# Patient Record
Sex: Female | Born: 1990 | Hispanic: Yes | Marital: Single | Smoking: Never smoker
Health system: Southern US, Community
[De-identification: ages and names within clinical notes are randomized; demographics above are authoritative.]

## PROBLEM LIST (undated history)

## (undated) DIAGNOSIS — Z923 Personal history of irradiation: Secondary | ICD-10-CM

## (undated) DIAGNOSIS — C50919 Malignant neoplasm of unspecified site of unspecified female breast: Secondary | ICD-10-CM

## (undated) DIAGNOSIS — Z9221 Personal history of antineoplastic chemotherapy: Secondary | ICD-10-CM

## (undated) HISTORY — PX: BREAST BIOPSY: SHX20

## (undated) HISTORY — PX: BREAST LUMPECTOMY: SHX2

---

## 2019-03-21 ENCOUNTER — Other Ambulatory Visit (HOSPITAL_COMMUNITY): Payer: Self-pay | Admitting: *Deleted

## 2019-03-21 DIAGNOSIS — N644 Mastodynia: Secondary | ICD-10-CM

## 2019-04-26 ENCOUNTER — Ambulatory Visit (HOSPITAL_COMMUNITY)
Admission: RE | Admit: 2019-04-26 | Discharge: 2019-04-26 | Disposition: A | Discharge status: Home / Self Care | Payer: Self-pay | Source: Ambulatory Visit | Attending: Obstetrics and Gynecology | Admitting: Obstetrics and Gynecology

## 2019-04-26 ENCOUNTER — Encounter (HOSPITAL_COMMUNITY): Payer: Self-pay

## 2019-04-26 ENCOUNTER — Other Ambulatory Visit: Payer: Self-pay

## 2019-04-26 DIAGNOSIS — N6310 Unspecified lump in the right breast, unspecified quadrant: Secondary | ICD-10-CM

## 2019-04-26 DIAGNOSIS — Z01419 Encounter for gynecological examination (general) (routine) without abnormal findings: Secondary | ICD-10-CM

## 2019-04-26 NOTE — Patient Instructions (Signed)
Explained breast self awareness with Monserrath Wynn Banker. Let patient know [facility] will cover Pap smears every 3 years unless has a history of abnormal Pap smears. Referred patient to the [facility] for a right breast ultrasound. Appointment scheduled for Wednesday, April 27, 2019 at 1030. Patient aware of appointment and will be there. Let patient know will follow up with her within the next couple weeks with results of Pap smear by letter or phone. [place] verbalized understanding.  Navie Lamoreaux, Arvil Chaco, RN 3:09 PM

## 2019-04-26 NOTE — Progress Notes (Signed)
Complaints of right breast lump x 2 months that became painful around a week ago. Patient states the pain is constant. Patient rates the pain at a 5 out of 10.  Pap Smear: Pap smear completed today. Per patient has never had a Pap smear completed. No Pap smear results are in Epic.  Physical exam: Breasts Breasts symmetrical. No skin abnormalities bilateral breasts. No nipple retraction bilateral breasts. No nipple discharge bilateral breasts. No lymphadenopathy. No lumps palpated left breast. Palpated a 13 cm x 13 cm lump within the right center breast under the nipple area. Complaints of tenderness when palpated right breast lump. Referred patient to the [facility] for a right breast ultrasound. Appointment scheduled for Wednesday, April 27, 2019 at 1030.        Pelvic/Bimanual   Ext Genitalia No lesions, no swelling and no discharge observed on external genitalia.         Vagina Vagina pink and normal texture. No lesions or discharge observed in vagina.          Cervix Cervix is present. Cervix pink and of normal texture. No discharge observed. IUD strings visualized.    Uterus Uterus is present and palpable. Uterus in normal position and normal size.       Adnexae Bilateral ovaries present and palpable. No tenderness on palpation.         Rectovaginal No rectal exam completed today since patient had no rectal complaints. No skin abnormalities observed on exam.    Smoking History: Patient has never smoked.  Patient Navigation: Patient education provided. Access to services provided for patient through [facility] program. Spanish interpreter provided.   Breast and Cervical Cancer Risk Assessment: Patient has a family history of her mother and a maternal aunt having breast cancer. Patient has no known genetic mutations or history of radiation treatment to the chest before age 78. Patient has no history of cervical dysplasia, immunocompromised, or DES exposure in-utero.  Breast cancer risk completed. No breast cancer risk calculated due to patient is less than 61 years old.  Used Spanish interpreter [employer] from [place].

## 2019-04-27 ENCOUNTER — Other Ambulatory Visit (HOSPITAL_COMMUNITY): Payer: Self-pay | Admitting: Obstetrics and Gynecology

## 2019-04-27 ENCOUNTER — Ambulatory Visit
Admission: RE | Admit: 2019-04-27 | Discharge: 2019-04-27 | Disposition: A | Discharge status: Home / Self Care | Payer: No Typology Code available for payment source | Source: Ambulatory Visit | Attending: Obstetrics and Gynecology | Admitting: Obstetrics and Gynecology

## 2019-04-27 DIAGNOSIS — N6310 Unspecified lump in the right breast, unspecified quadrant: Secondary | ICD-10-CM

## 2019-04-27 DIAGNOSIS — N644 Mastodynia: Secondary | ICD-10-CM

## 2019-04-27 LAB — CYTOLOGY - PAP: Diagnosis: NEGATIVE

## 2019-04-27 IMAGING — US US BREAST*R* LIMITED INC AXILLA
1 series · 13 of 20 positions shown · non-contrast
Comparison: Previous exam(s).

CLINICAL DATA: Rapidly enlarging mass felt by the patient in the
right breast since [DATE]. She reports that the mass was
approximately the size of a golf ball when she 1st noticed it.
Family history of breast cancer in the patient's mother and maternal
aunt. She is not sure but thinks that her mother was diagnosed with
breast cancer in her late 30's.

EXAM:
ULTRASOUND OF THE RIGHT BREAST

[Series 1: us breast*right* limited inc axilla · 0.12mm/px · 13 of 20 slices shown]
[im 1/20]
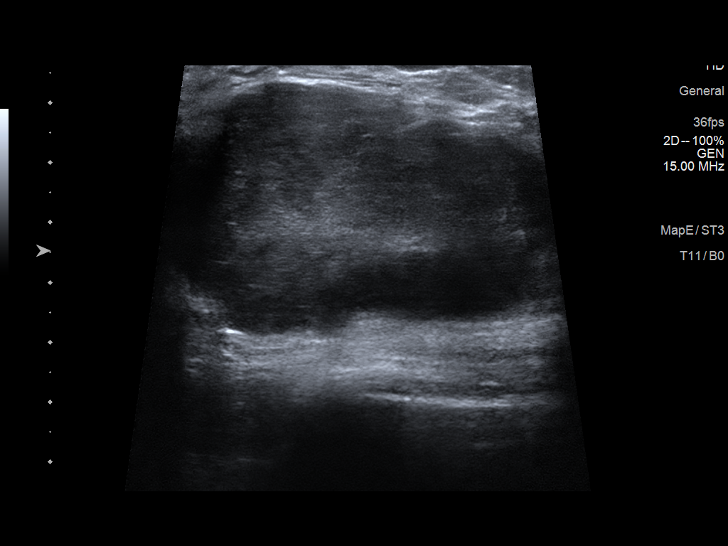
[im 3/20]
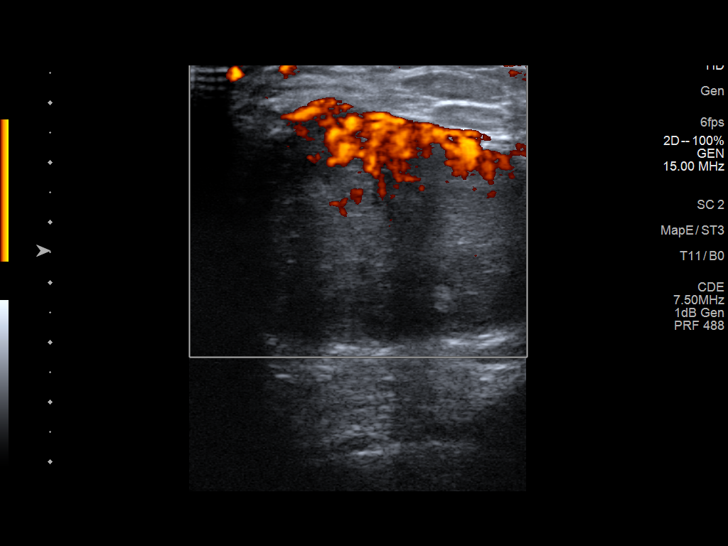
[im 4/20]
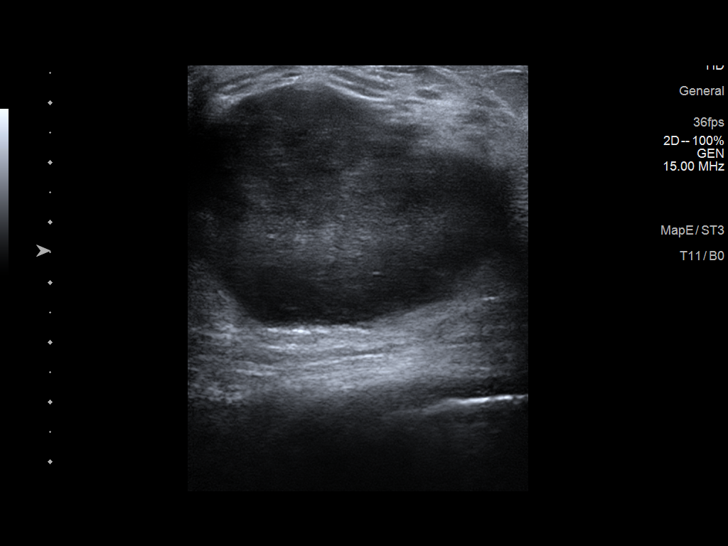
[im 6/20]
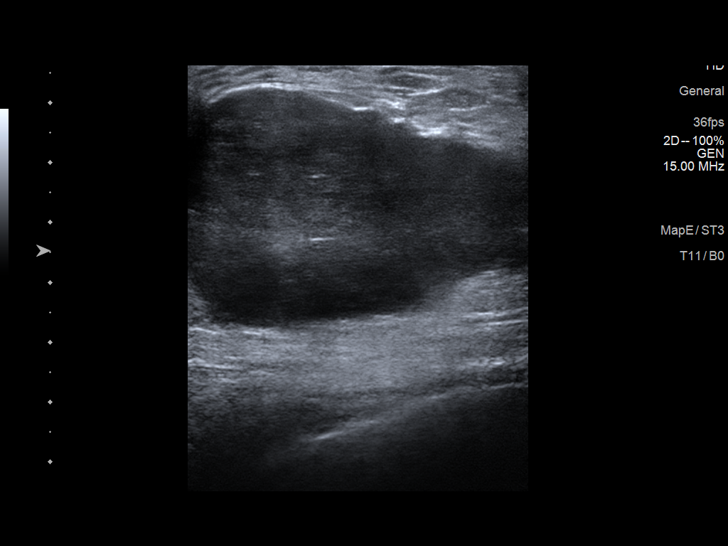
[im 7/20]
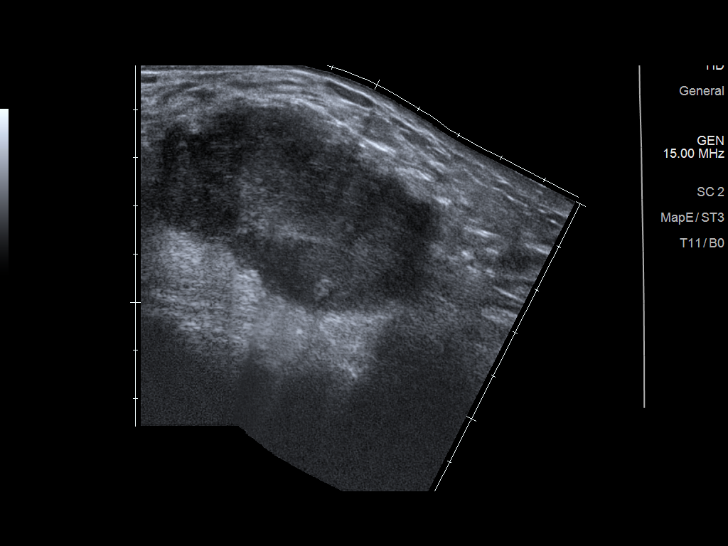
[im 9/20]
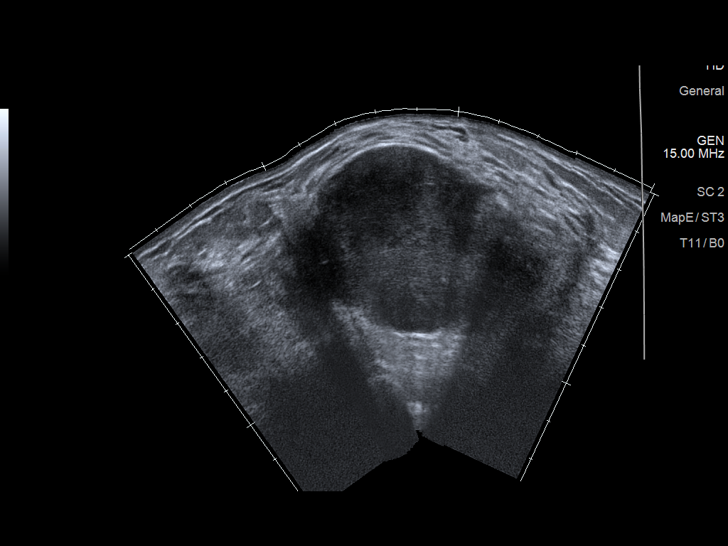
[im 11/20]
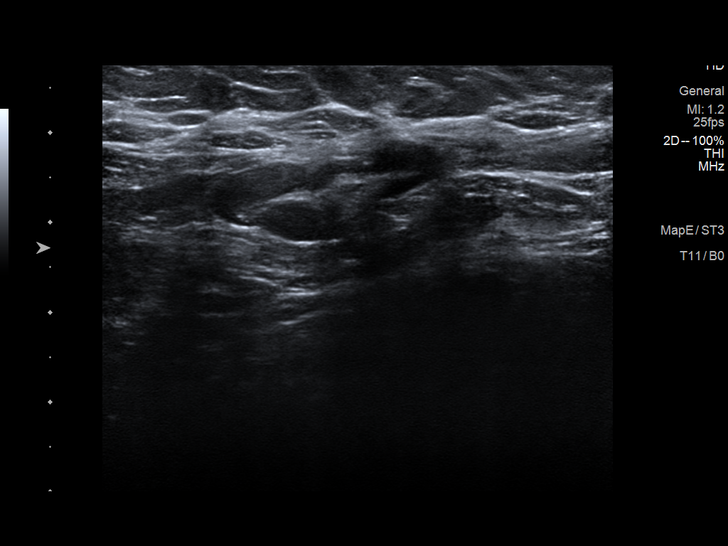
[im 12/20]
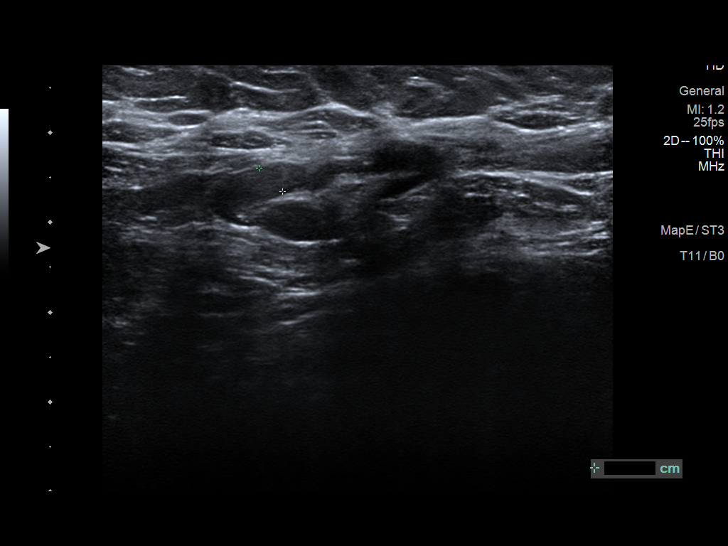
[im 14/20]
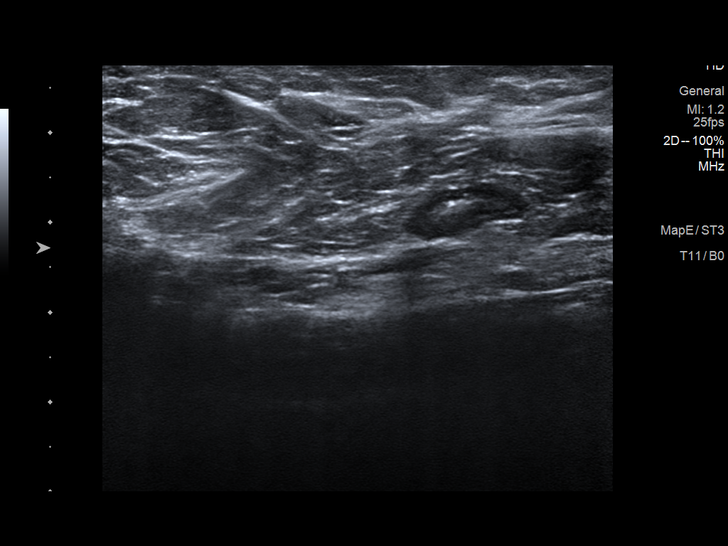
[im 15/20]
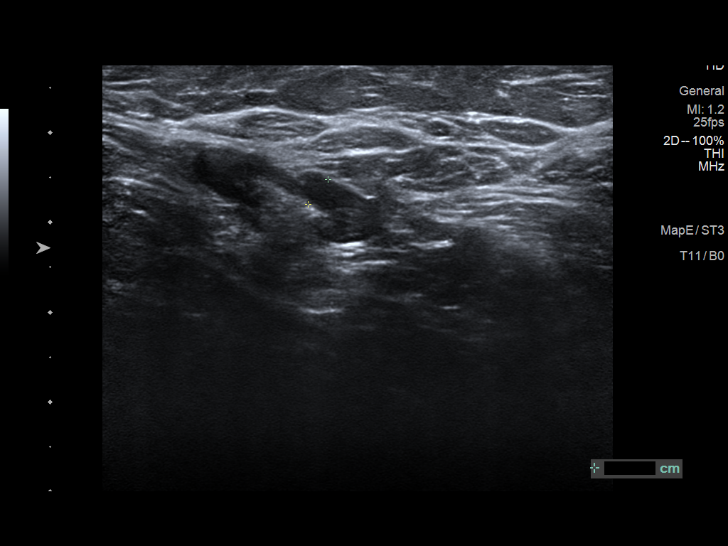
[im 17/20]
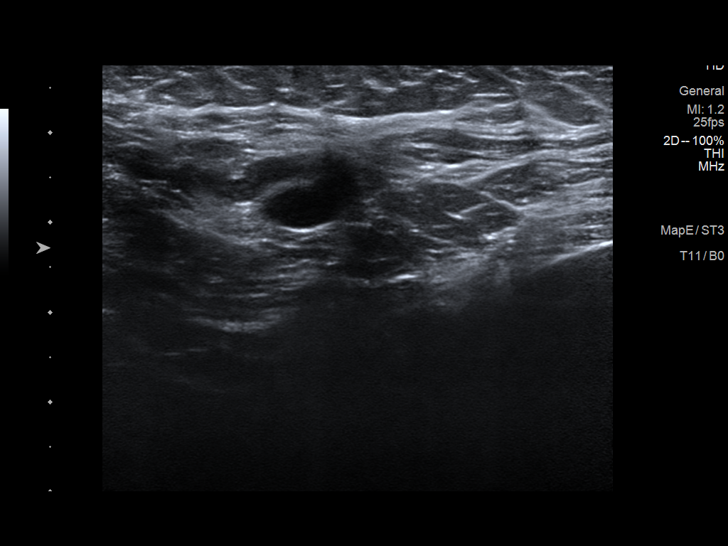
[im 18/20]
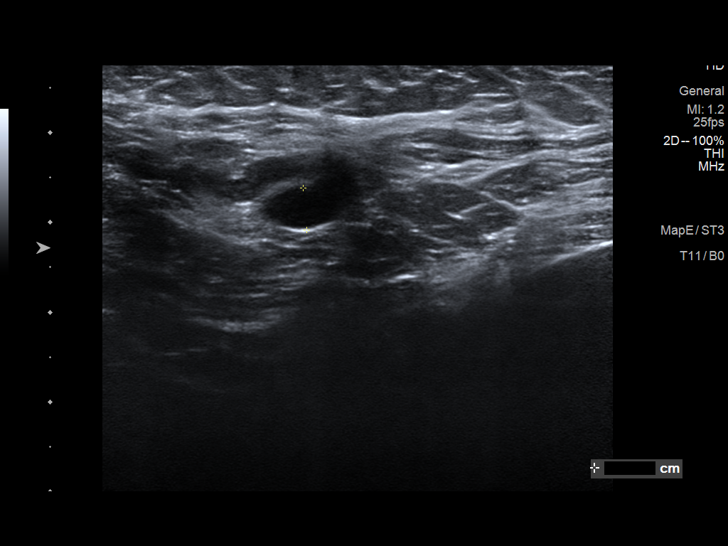
[im 20/20]
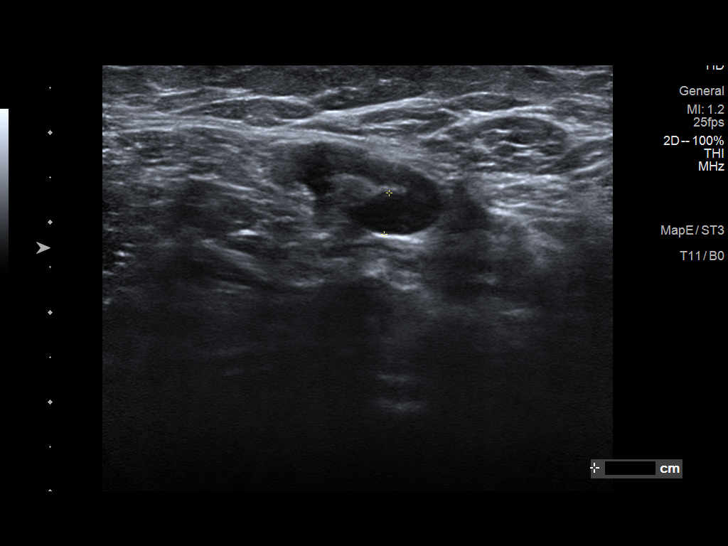

[13 of 20 positions shown; findings below may reference images not displayed]

FINDINGS: On physical exam, there is an approximately 7 cm oval, firm,
somewhat fixed palpable mass in the inferior retroareolar and
periareolar right breast. There are no palpable right axillary lymph
nodes.

Targeted ultrasound is performed, showing a 6.1 x 6.0 x 3.8 cm oval,
horizontally oriented, mildly heterogeneous, hypoechoic mass
centered in the 5 o'clock position of the right breast, 3 cm from
the nipple. This has some circumscribed and some indistinct and
mildly irregular margins.

Ultrasound of the right axilla demonstrated multiple normal
appearing lymph nodes as well as a lymph node with focal cortical
thickening inferiorly, in the inferomedial right axilla. The focally
thickened portion measures 4.7 mm in maximum thickness.
IMPRESSION: 1. 6.1 cm palpable mass centered in the 5 o'clock position of the
right breast with imaging features suspicious for malignancy.
2. Single right inferomedial axillary lymph node with focal cortical
thickening inferiorly, suspicious for a metastatic node.

RECOMMENDATION:
Ultrasound-guided core needle biopsy of the 6.1 cm mass in the 5
o'clock position of the right breast and ultrasound-guided core
needle biopsy of the abnormal appearing right axillary lymph node.
This has been discussed with the patient and the biopsies have been
scheduled at [DATE] p.m. on [DATE].

I have discussed the findings and recommendations with the patient.
If applicable, a reminder letter will be sent to the patient
regarding the next appointment.

BI-RADS CATEGORY  4: Suspicious.

## 2019-05-04 ENCOUNTER — Ambulatory Visit
Admission: RE | Admit: 2019-05-04 | Discharge: 2019-05-04 | Disposition: A | Discharge status: Home / Self Care | Payer: No Typology Code available for payment source | Source: Ambulatory Visit | Attending: Obstetrics and Gynecology | Admitting: Obstetrics and Gynecology

## 2019-05-04 ENCOUNTER — Other Ambulatory Visit: Payer: Self-pay

## 2019-05-04 DIAGNOSIS — N6310 Unspecified lump in the right breast, unspecified quadrant: Secondary | ICD-10-CM

## 2019-05-04 IMAGING — US US  BREAST BX W/ LOC DEV 1ST LESION IMG BX SPEC US GUIDE*R*
1 series · 14 of 17 positions shown · non-contrast
Comparison: Previous exam(s).

CLINICAL DATA: Patient with large palpable right breast mass.

EXAM:
ULTRASOUND GUIDED RIGHT BREAST CORE NEEDLE BIOPSY

[Series 1: us breast bx w/ loc dev 1st lesion img bx spec us  · 0.08mm/px · 14 of 17 slices shown]
[im 1/17]
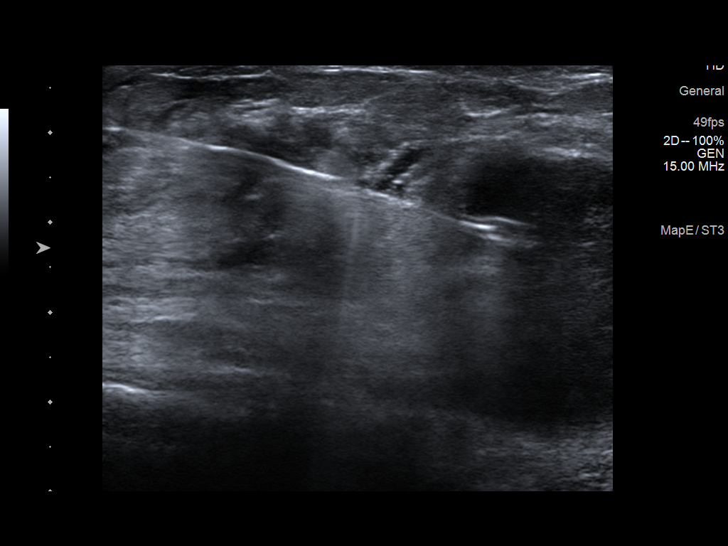
[im 2/17]
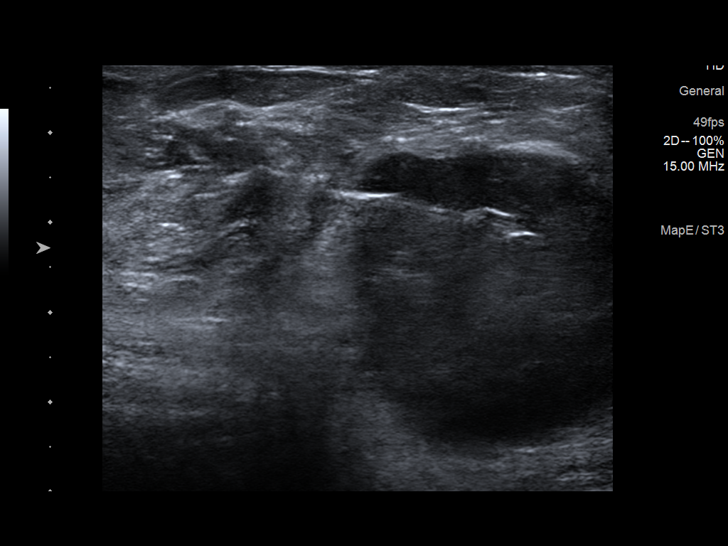
[im 4/17]
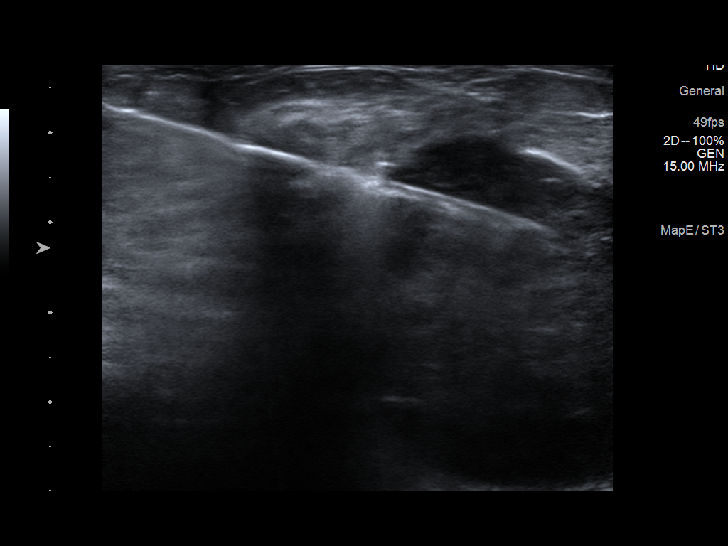
[im 5/17]
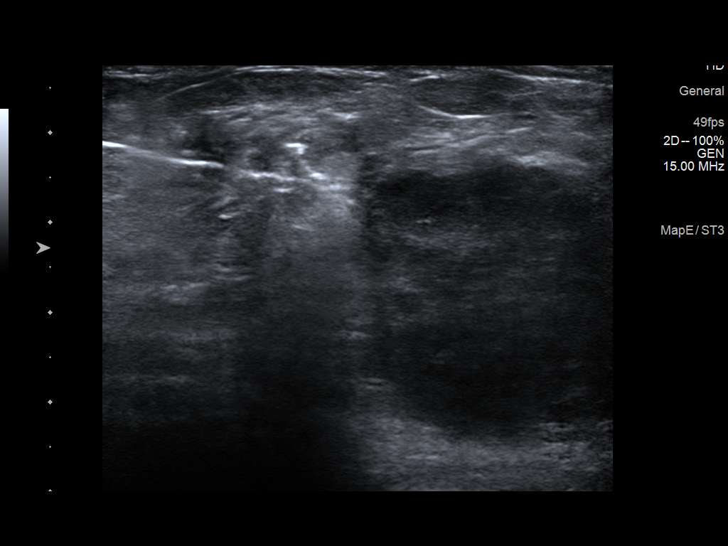
[im 6/17]
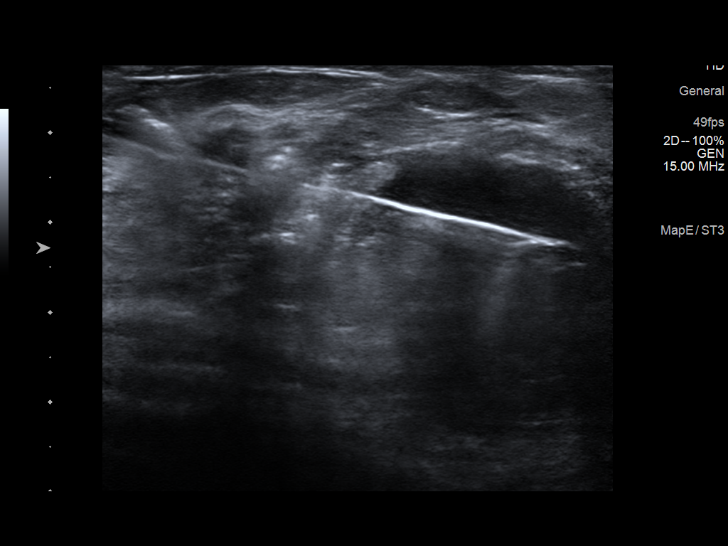
[im 7/17]
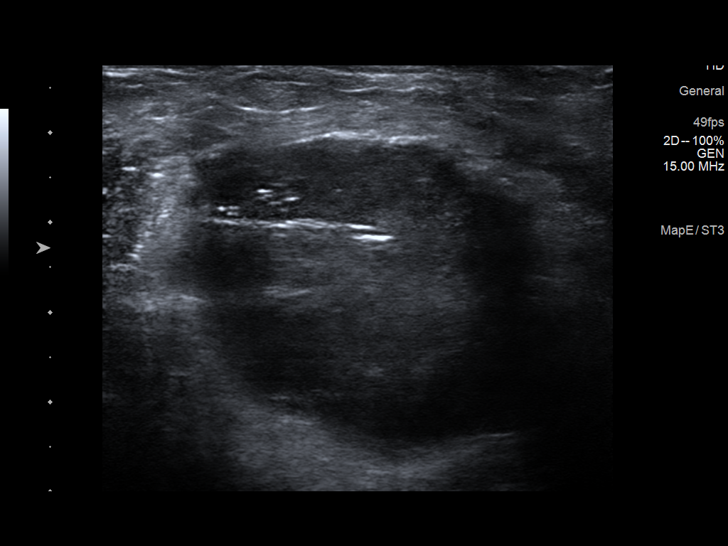
[im 8/17]
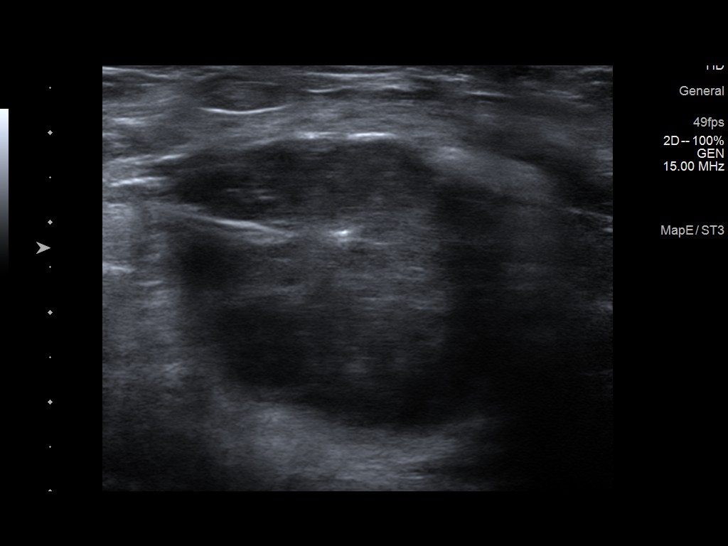
[im 10/17]
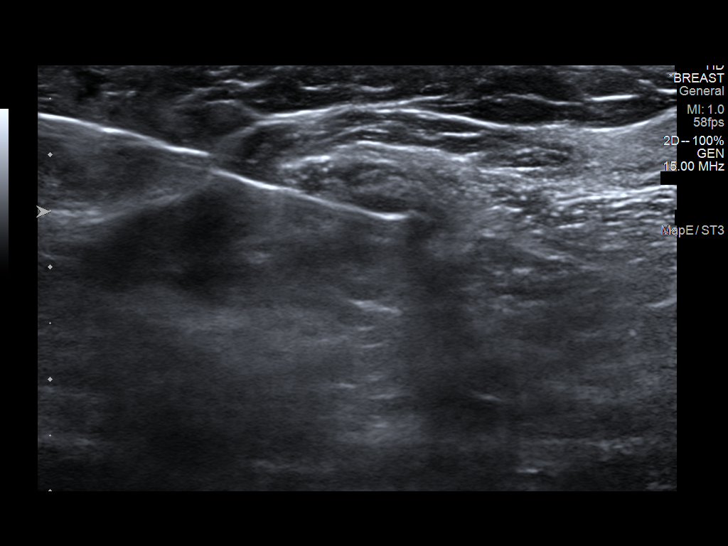
[im 11/17]
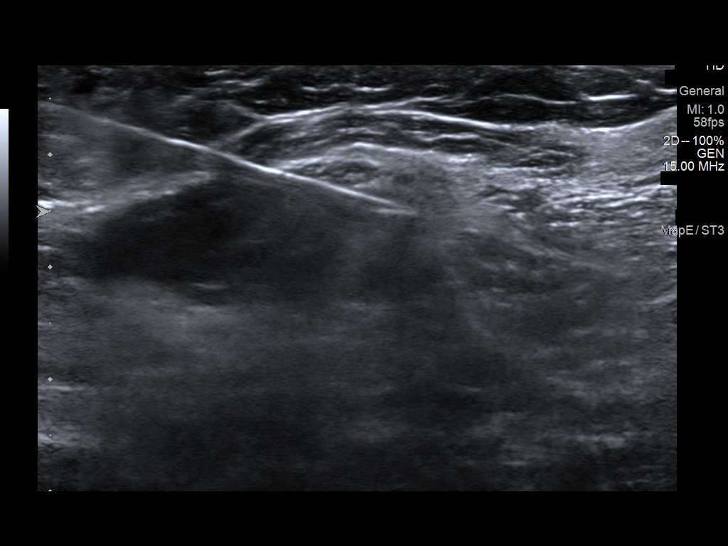
[im 12/17]
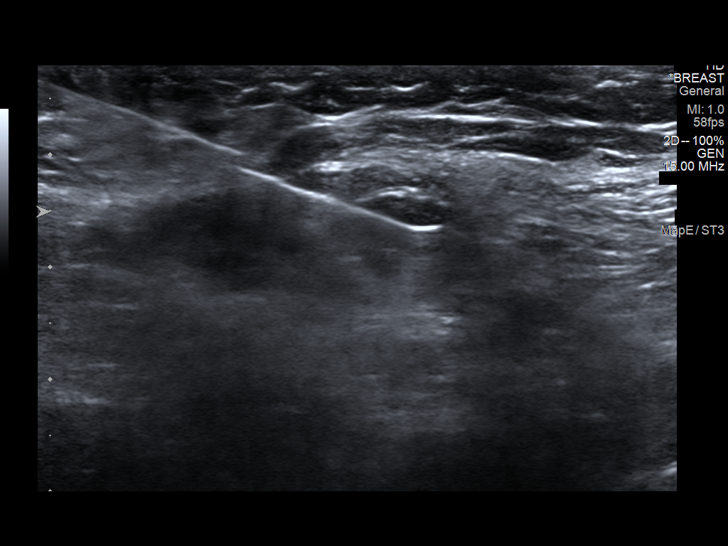
[im 13/17]
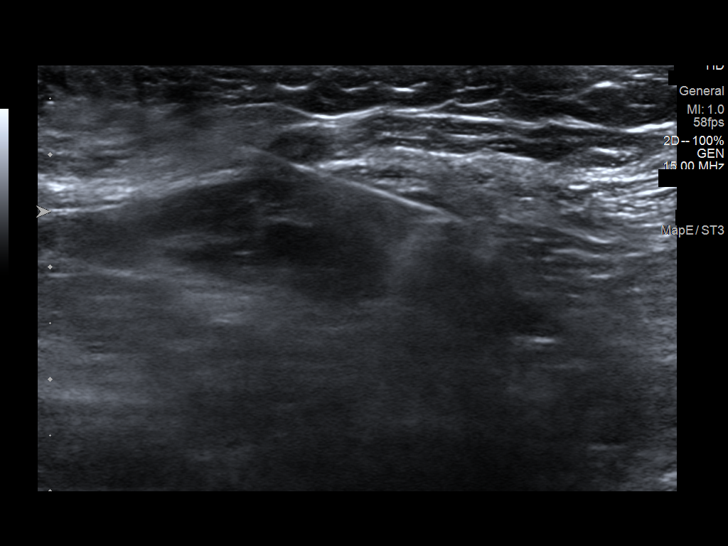
[im 14/17]
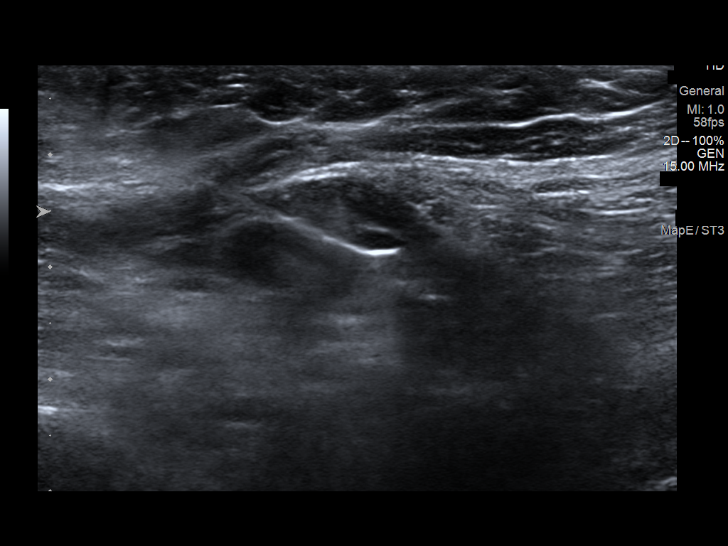
[im 16/17]
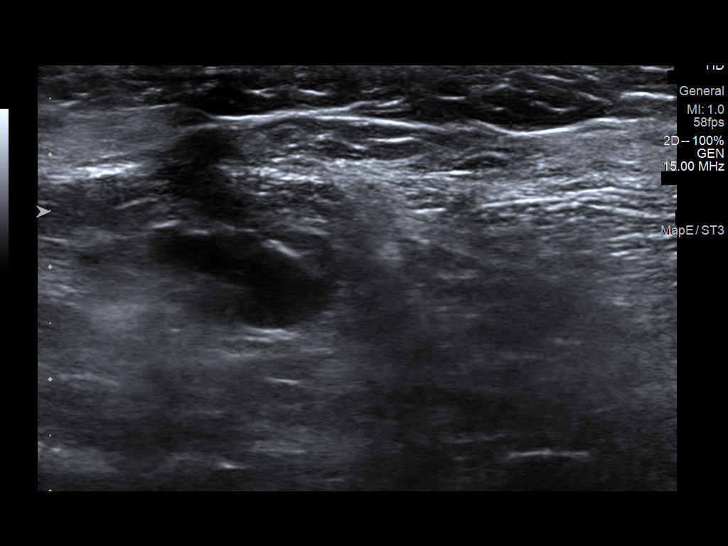
[im 17/17]
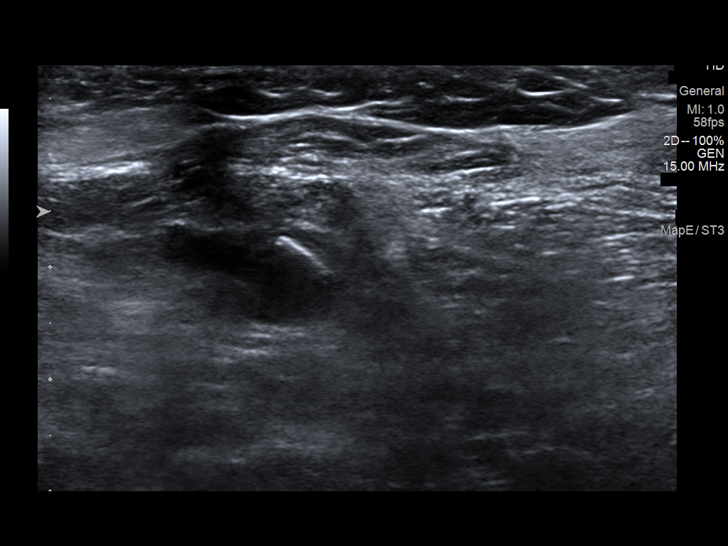

[14 of 17 positions shown; findings below may reference images not displayed]



Lesion quadrant: Lower inner quadrant

Using sterile technique and 1% Lidocaine as local anesthetic, under
direct ultrasound visualization, a 14 gauge CHAGO device was
used to perform biopsy of right breast mass 5 o'clock position using
a lateral approach. At the conclusion of the procedure Q shaped
tissue marker clip was deployed into the biopsy cavity.
IMPRESSION: Ultrasound guided biopsy of right breast mass. No apparent
complications.

## 2019-05-05 ENCOUNTER — Ambulatory Visit
Admission: RE | Admit: 2019-05-05 | Discharge: 2019-05-05 | Disposition: A | Discharge status: Home / Self Care | Payer: No Typology Code available for payment source | Source: Ambulatory Visit | Attending: Obstetrics and Gynecology | Admitting: Obstetrics and Gynecology

## 2019-05-05 ENCOUNTER — Other Ambulatory Visit: Payer: Self-pay | Admitting: Obstetrics and Gynecology

## 2019-05-05 DIAGNOSIS — Z853 Personal history of malignant neoplasm of breast: Secondary | ICD-10-CM

## 2019-05-05 DIAGNOSIS — C801 Malignant (primary) neoplasm, unspecified: Secondary | ICD-10-CM

## 2019-05-05 HISTORY — DX: Malignant (primary) neoplasm, unspecified: C80.1

## 2019-05-05 IMAGING — MG DIGITAL DIAGNOSTIC BILAT W/ TOMO W/ CAD
8 series · 8 of 24 positions shown · non-contrast
Comparison: Prior right breast ultrasounds dated [DATE] and
[DATE].

CLINICAL DATA: 28-year-old female with diagnosis of grade 2-3
invasive ductal carcinoma of the right breast post ultrasound-guided
biopsy of a 6.1 cm mass in the central right breast yesterday
[DATE]. An abnormal lymph node in the right axilla was biopsied
and was negative for metastatic disease, however considered
discordant given its abnormal appearance. The patient returns today
for bilateral diagnostic mammography as this was not initially
performed due to her young age.

EXAM:
DIGITAL DIAGNOSTIC BILATERAL MAMMOGRAM WITH CAD AND TOMO

[L MLO synth-2D]
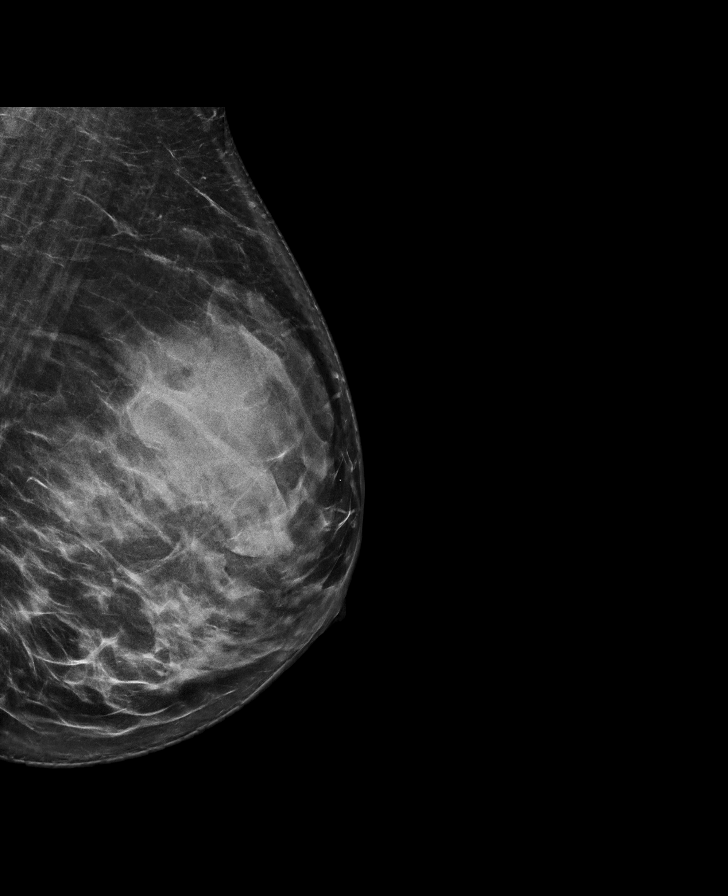

[R CC synth-2D]
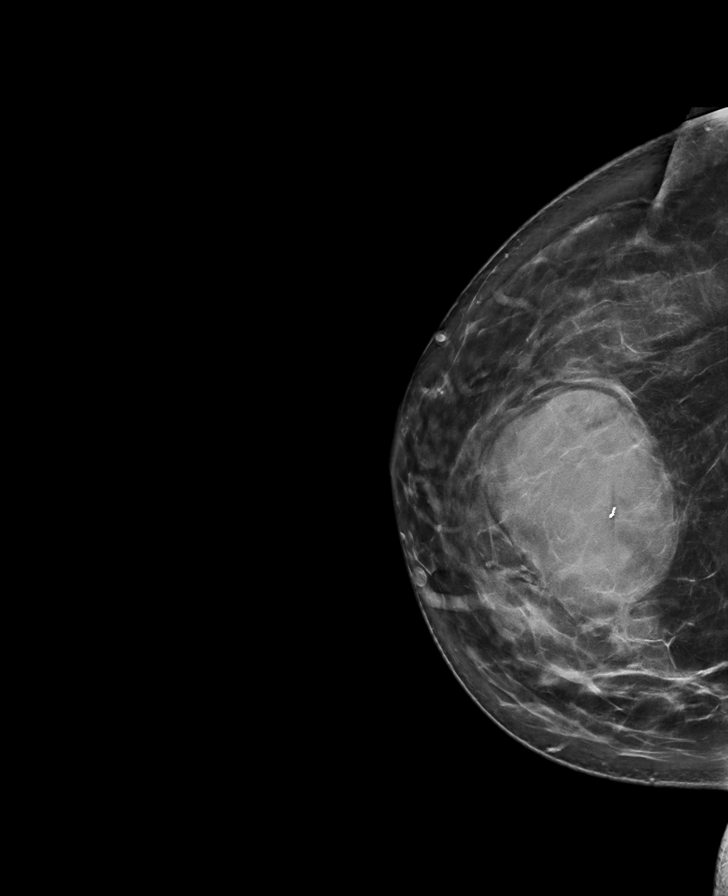

[R MLO synth-2D]
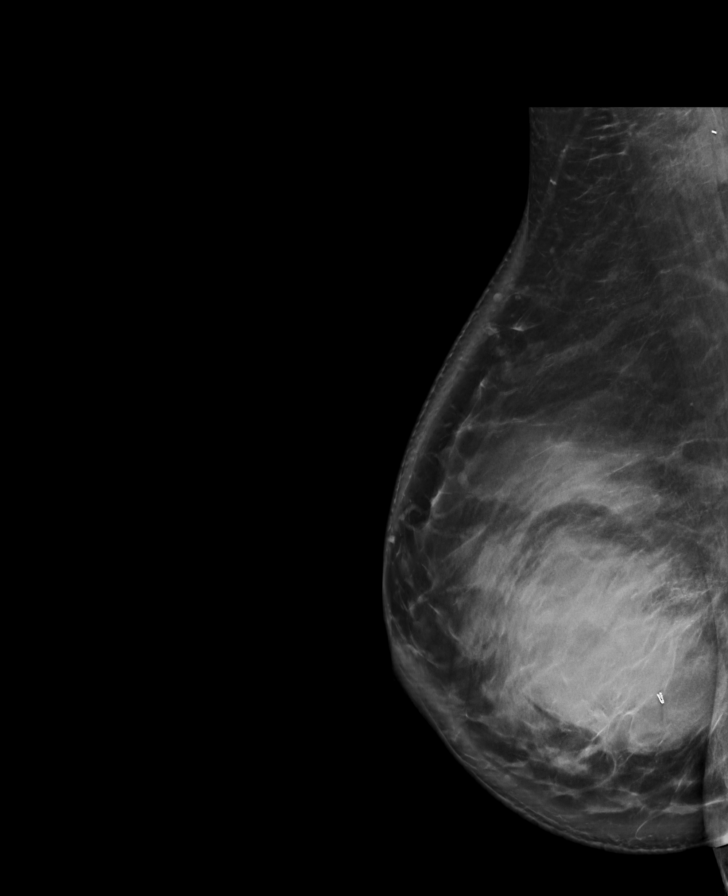

[L CC synth-2D]
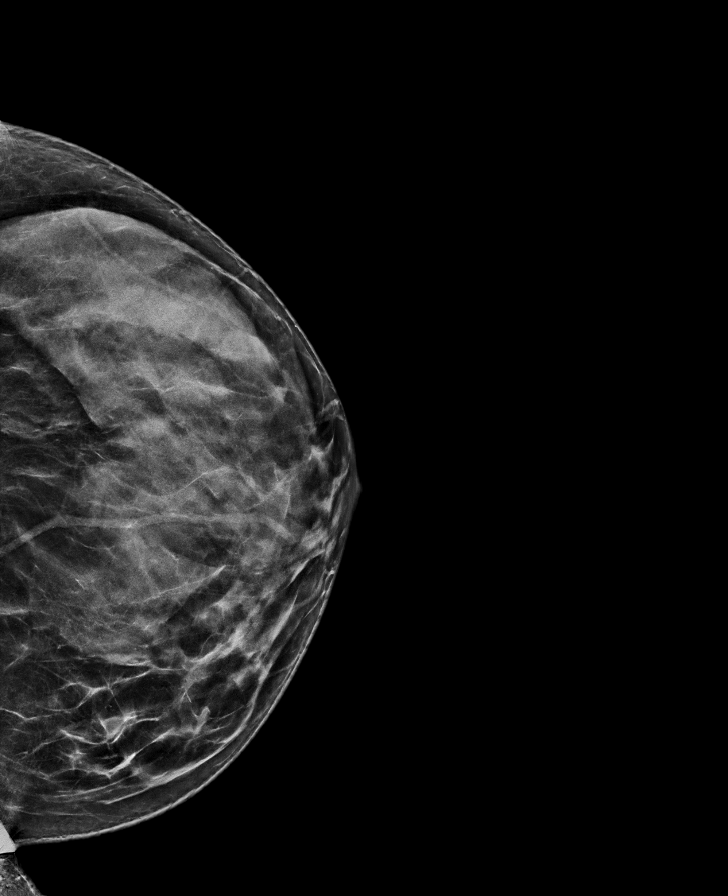

[R MLO tomo · tomo slice 55/108.0]
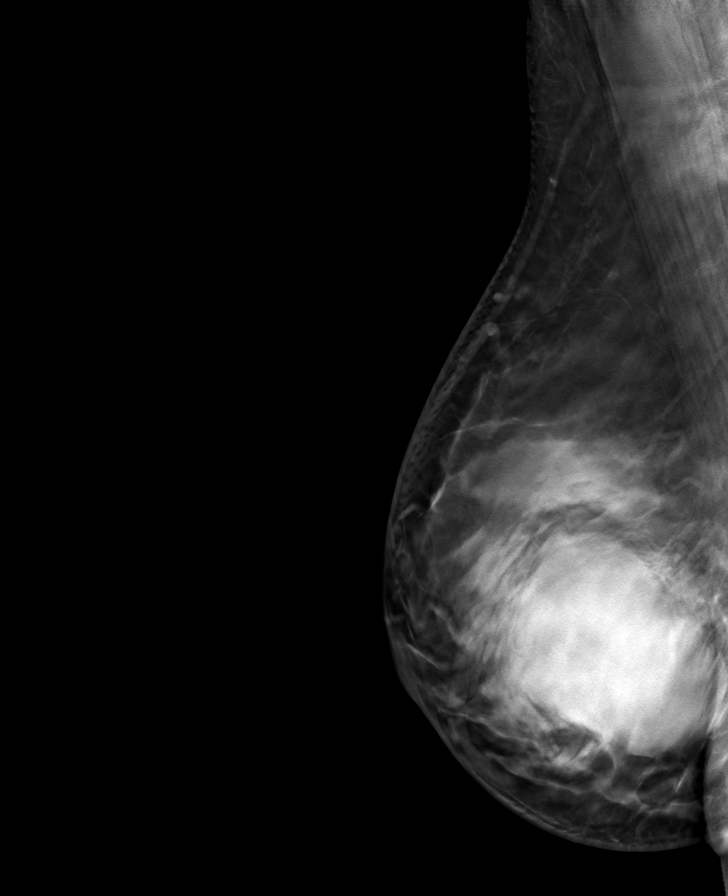

[L CC tomo · tomo slice 34/67.0]
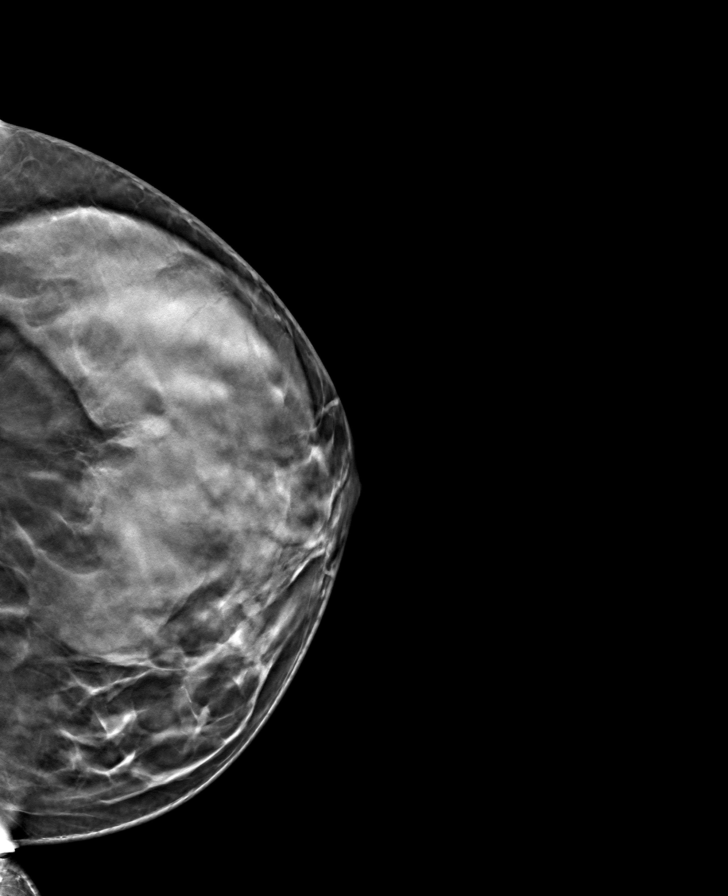

[L MLO tomo · tomo slice 38/75.0]
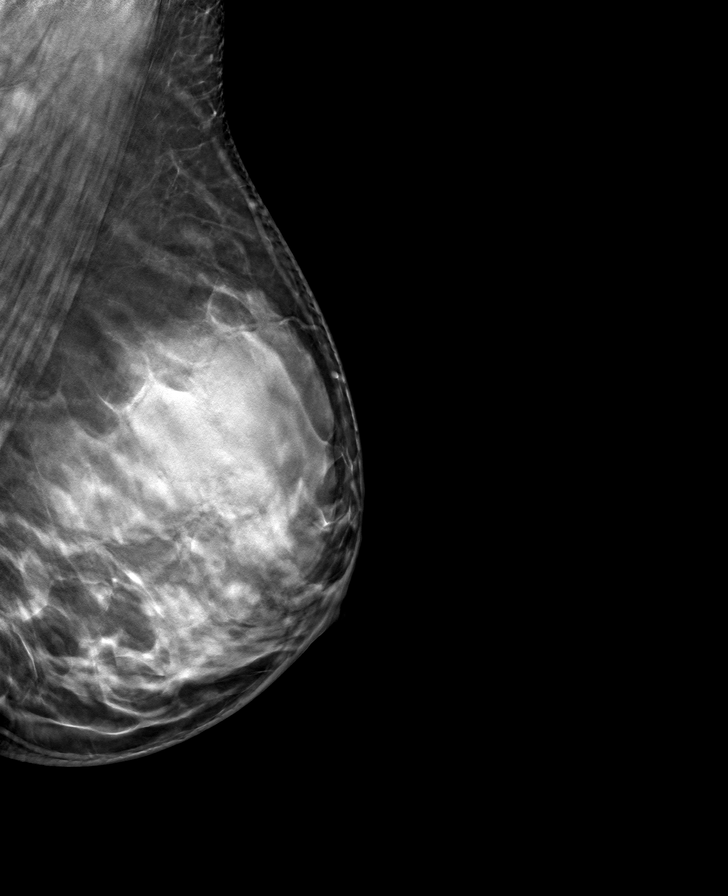

[R CC tomo · tomo slice 49/98.0]
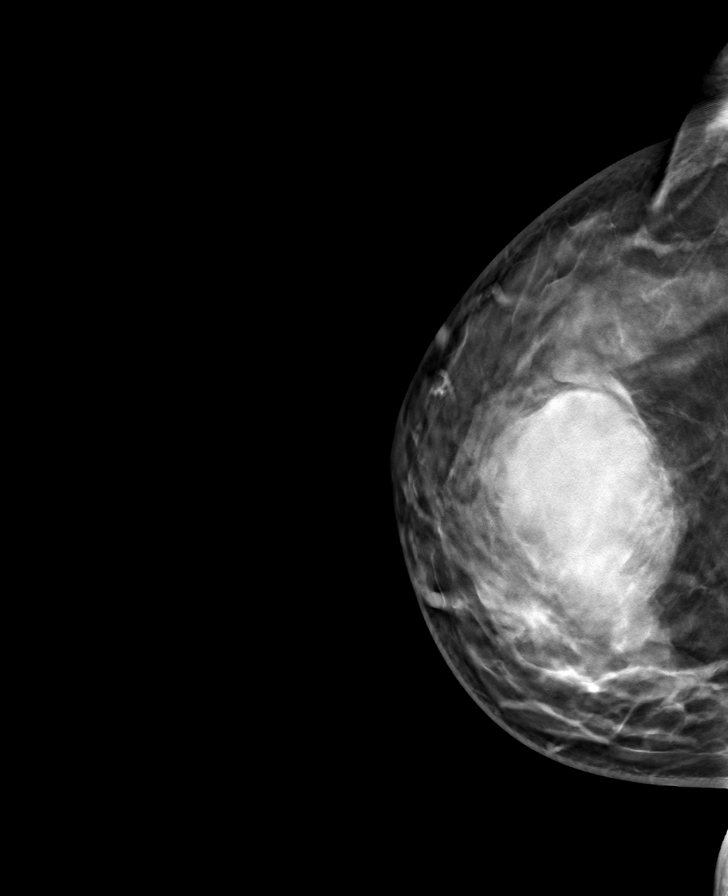

[8 of 24 positions shown; findings below may reference images not displayed]

ACR Breast Density Category d: The breast tissue is extremely dense,
which lowers the sensitivity of mammography.
FINDINGS: Large round mass within the central to slightly inner right breast
containing a Q shaped biopsy marking clip compatible with biopsy
proven malignancy measures 6.1 cm. No definite additional masses
identified. No calcifications seen in the right breast. An
abnormal/enlarged lymph node containing a HydroMARK clip is
partially visualized within the right axilla. No suspicious masses
or calcifications are identified in the left breast.

Mammographic images were processed with CAD.
IMPRESSION: 1. Biopsy proven malignancy in the right breast measures 6.1 cm
mammographically with the previously biopsied morphologically
abnormal lymph node partially visualized in the right axilla. No
additional masses or calcifications seen in the right breast.

2.  No mammographic evidence of malignancy in the left breast.

RECOMMENDATION:
1.  Treatment plan for known right breast malignancy.

2. Given extremely dense fibroglandular tissue, consider contrast
enhanced breast MRI.

The findings and recommendations were discussed with the patient via
Spanish interpreter, KUMIKO. The patient is aware of her
biopsy results (biopsy performed yesterday) and was informed of her
appointment at [REDACTED] scheduled for tomorrow.

I have discussed the findings and recommendations with the patient.
If applicable, a reminder letter will be sent to the patient
regarding the next appointment.

BI-RADS CATEGORY  6: Known biopsy-proven malignancy.

## 2019-05-06 ENCOUNTER — Telehealth: Payer: Self-pay | Admitting: Oncology

## 2019-05-06 ENCOUNTER — Ambulatory Visit: Payer: Self-pay | Admitting: Surgery

## 2019-05-06 NOTE — H&P (Signed)
SANJANA CLIFT Documented: 05/06/2019 9:37 AM Location: [facility] Patient #: [id number] DOB: 17-Jul-1990 Unknown / Language: Undefined / Race: Undefined Female  History of Present Illness Marcello Moores A. Jaquarious Grey MD; 05/06/2019 11:49 AM) Patient words: Patient presents with her husband today after being seen in the [facility] [place] due to large right breast mass. The patient's accompanied by her husband. He states the mass. There since earlier this year. The patient denies a masses been there. Ultrasound was performed which showed a 6 cm central breast mass. Core biopsy showed grade 2 to grade 3 invasive ductal carcinoma and the receptors are pending. Patient has 2 first-degree relatives with breast cancer. She is unclear how long the mass has been there. She denies any nipple crusting or nipple discharge bilaterally.     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 An abnormal lymph node in the right axilla was biopsied and was negative for metastatic disease, however considered discordant given its abnormal appearance. The patient returns today for bilateral diagnostic mammography as this was not initially performed due to her young age.  EXAM: DIGITAL DIAGNOSTIC BILATERAL MAMMOGRAM WITH CAD AND TOMO  COMPARISON: Prior right breast ultrasounds dated 04/27/2019 and 05/04/2019.  ACR Breast Density Category d: The breast tissue is extremely dense, which lowers the sensitivity of mammography.  FINDINGS: Large round mass within the central to slightly inner right breast containing a Q shaped biopsy marking clip compatible with biopsy proven malignancy measures 6.1 cm. No definite additional masses identified. No calcifications seen in the right breast. An abnormal/enlarged lymph node containing a  HydroMARK clip is partially visualized within the right axilla. No suspicious masses or calcifications are identified in the left breast.  Mammographic images were processed with CAD.  IMPRESSION: 1. Biopsy proven malignancy in the right breast measures 6.1 cm mammographically with the previously biopsied morphologically abnormal lymph node partially visualized in the right axilla. No additional masses or calcifications seen in the right breast.  2. No mammographic evidence of malignancy in the left breast.  RECOMMENDATION: 1. Treatment plan for known right breast malignancy.  2. Given extremely dense fibroglandular tissue, consider contrast enhanced breast MRI.  The findings and recommendations were discussed with the patient via [place] interpreter, [place]. The patient is aware of her biopsy results (biopsy performed yesterday) and was informed of her appointment at [facility] surgery scheduled for tomorrow.  I have discussed the findings and recommendations with the patient. If applicable, a reminder letter will be sent to the patient regarding the next appointment.  BI-RADS CATEGORY 6: Known biopsy-proven malignancy.   Electronically Signed By: Everlean Alstrom M.D. On: 05/05/2019 14:09           Diagnosis 1. Breast, right, needle core biopsy, 5 o'clock - INVASIVE DUCTAL CARCINOMA, GRADE II/III. - SEE MICROSCOPIC DESCRIPTION. 2. Lymph node, needle/core biopsy, right axilla - LYMPH NODE WITH REACTIVE GERMINAL CENTERS.  The patient is a 28 year old female.   Past Surgical History (Tanisha A. Owens Shark, [place]; 05/06/2019 9:37 AM) No pertinent past surgical history  Allergies (Tanisha A. Owens Shark, [place]; 05/06/2019 9:38 AM) No Known Drug Allergies [05/06/2019]: Allergies Reconciled  Medication History (Tanisha A. Owens Shark, [place]; 05/06/2019 9:38 AM) No Current Medications Medications Reconciled  Social History (Tanisha A. Owens Shark, [place]; 05/06/2019  9:37 AM) Alcohol use Remotely quit alcohol use. Caffeine use Coffee. No drug use  Tobacco use Never smoker.  Family History (Tanisha A. Owens Shark, [place]; 05/06/2019 9:37 AM) Breast Cancer Mother.  Pregnancy / Birth History (Tanisha A. Owens Shark, [place]; 05/06/2019 9:37 AM) Regular periods     Review of Systems (Tanisha A. Brown RMA; 05/06/2019 9:37 AM) General Not Present- Appetite Loss, Chills, Fatigue, Fever, Night Sweats, Weight Gain and Weight Loss. Skin Not Present- Change in Wart/Mole, Dryness, Hives, Jaundice, New Lesions, Non-Healing Wounds, Rash and Ulcer. HEENT Not Present- Earache, Hearing Loss, Hoarseness, Nose Bleed, Oral Ulcers, Ringing in the Ears, Seasonal Allergies, Sinus Pain, Sore Throat, Visual Disturbances, Wears glasses/contact lenses and Yellow Eyes. Breast Present- Breast Pain. Not Present- Breast Mass, Nipple Discharge and Skin Changes. Cardiovascular Not Present- Chest Pain, Difficulty Breathing Lying Down, Leg Cramps, Palpitations, Rapid Heart Rate, Shortness of Breath and Swelling of Extremities. Gastrointestinal Not Present- Abdominal Pain, Bloating, Bloody Stool, Change in Bowel Habits, Chronic diarrhea, Constipation, Difficulty Swallowing, Excessive gas, Gets full quickly at meals, Hemorrhoids, Indigestion, Nausea, Rectal Pain and Vomiting. Female Genitourinary Not Present- Frequency, Nocturia, Painful Urination, Pelvic Pain and Urgency. Musculoskeletal Not Present- Back Pain, Joint Pain, Joint Stiffness, Muscle Pain, Muscle Weakness and Swelling of Extremities. Neurological Not Present- Decreased Memory, Fainting, Headaches, Numbness, Seizures, Tingling, Tremor, Trouble walking and Weakness. Psychiatric Not Present- Anxiety, Bipolar, Change in Sleep Pattern, Depression, Fearful and Frequent crying. Endocrine Not Present- Cold Intolerance, Excessive Hunger, Hair Changes, Heat Intolerance, Hot flashes and New Diabetes. Hematology Not Present- Blood Thinners, Easy  Bruising, Excessive bleeding, Gland problems, HIV and Persistent Infections.  Vitals (Tanisha A. Brown RMA; 05/06/2019 9:38 AM) 05/06/2019 9:37 AM Weight: 157.2 lb Height: 62in Body Surface Area: 1.73 m Body Mass Index: 28.75 kg/m  Temp.: 97.15F  Pulse: 90 (Regular)  BP: 128/84 (Sitting, Left Arm, Standard)        Physical Exam (Eusebio Blazejewski A. Ignacio Lowder MD; 05/06/2019 11:50 AM)  General Mental Status-Alert. General Appearance-Consistent with stated age. Hydration-Well hydrated. Voice-Normal.  Head and Neck Head-normocephalic, atraumatic with no lesions or palpable masses. Trachea-midline. Thyroid Gland Characteristics - normal size and consistency.  Chest and Lung Exam Note: WOB normal no stridor  Breast Note: 6 cm right breast mass which is mobile which is centrally located. No nipple discharge. Left breast is normal. Right breast mass is mobile  Cardiovascular Note: NSR  Neurologic Neurologic evaluation reveals -alert and oriented x 3 with no impairment of recent or remote memory. Mental Status-Normal.  Musculoskeletal Normal Exam - Left-Upper Extremity Strength Normal and Lower Extremity Strength Normal. Normal Exam - Right-Upper Extremity Strength Normal and Lower Extremity Strength Normal.  Lymphatic Head & Neck  General Head & Neck Lymphatics: Bilateral - Description - Normal. Axillary  General Axillary Region: Bilateral - Description - Normal. Tenderness - Non Tender.    Assessment & Plan (Kita Neace A. 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 We will await the markers which are pending   Pt requires port placement for chemotherapy. Risk include bleeding, infection,  pneumothorax, hemothorax, mediastinal injury, nerve injury , blood vessel injury, strke, blood clots, death, migration. embolization and need for additional procedures. Pt agrees to proceed.  Current Plans You are being scheduled for surgery- Our schedulers will call you.  You should hear from our office's scheduling department within 5 working days about the location, date, and time of surgery.  We try to make accommodations for patient's preferences in scheduling surgery, but sometimes the OR schedule or the surgeon's schedule prevents [place] from making those accommodations.  If you have not heard from our office [phone]) in 5 working days, call the office and ask for your surgeon's nurse.  If you have other questions about your diagnosis, plan, or surgery, call the office and ask for your surgeon's nurse.  Pt Education - [facility] Breast Cancer Information Given - [facility] "Breast Journey" Package Pt Education - Pamphlet Given - Breast Biopsy: discussed with patient and provided information. We discussed the staging and pathophysiology of breast cancer. We discussed all of the different options for treatment for breast cancer including surgery, chemotherapy, radiation therapy, Herceptin, and antiestrogen therapy. We discussed a sentinel lymph node biopsy as she does not appear to having lymph node involvement right now. We discussed the performance of that with injection of radioactive tracer and blue dye. We discussed that she would have an incision underneath her axillary hairline. We discussed that there is a bout a 10-20% chance of having a positive node with a sentinel lymph node biopsy and we will await the permanent pathology to make any other first further decisions in terms of her treatment. One of these options might be to return to the operating room to perform an axillary lymph node dissection. We discussed about a 1-2% risk lifetime of chronic shoulder pain as well as lymphedema  associated with a sentinel lymph node biopsy. We discussed the options for treatment of the breast cancer which included lumpectomy versus a mastectomy. We discussed the performance of the lumpectomy with a wire placement. We discussed a 10-20% chance of a positive margin requiring reexcision in the operating room. We also discussed that she may need radiation therapy or antiestrogen therapy or both if she undergoes lumpectomy. We discussed the mastectomy and the postoperative care for that as well. We discussed that there is no difference in her survival whether she undergoes lumpectomy with radiation therapy or antiestrogen therapy versus a mastectomy. There is a slight difference in the local recurrence rate being 3-5% with lumpectomy and about 1% with a mastectomy. We discussed the risks of operation including bleeding, infection, possible reoperation. She understands her further therapy will be based on what her stages at the time of her operation.  Pt Education - [facility] breast cancer surgery: discussed with patient and provided information. Pt Education - [facility] Breast Biopsy HCI: discussed with patient and provided information. Pt Education - [facility] Mastectomy HCI Pt Education - ABC (After Breast Cancer) Class Info: discussed with patient and provided information. Use of a central venous catheter for intravenous therapy was discussed. Technique of catheter placement using ultrasound and fluoroscopy guidance was discussed. Risks such as bleeding, infection, pneumothorax, catheter occlusion, reoperation, and other risks were discussed. I noted a good likelihood this will help address the problem. Questions were answered. The patient expressed understanding & wishes to proceed.

## 2019-05-06 NOTE — Telephone Encounter (Signed)
Received a staff msg to schedule an urgent appt for Mrs. Brianna Owens for a dx of breast cancer. I spoke to her husband and scheduled an appt for the pt to see Dr. Jana Hakim on 11/23 at 1:30pm w/labs at 1pm. Pt's husband has been made aware to arrive 15 minutes early and that he may attend the appt along with his wife.

## 2019-05-08 DIAGNOSIS — C50811 Malignant neoplasm of overlapping sites of right female breast: Secondary | ICD-10-CM | POA: Insufficient documentation

## 2019-05-08 DIAGNOSIS — Z171 Estrogen receptor negative status [ER-]: Secondary | ICD-10-CM | POA: Insufficient documentation

## 2019-05-08 NOTE — Progress Notes (Signed)
[place]  [phone] [phone]     ID: Brianna Owens DOB: [birth year]  MR#: [id number]  PTW#:[id number]  Patient Care Team: Patient, [name] as PCP - General (General Practice) Javed Cotto, [name], MD as Consulting Physician (Oncology) Erroll Luna, MD as Consulting Physician (General Surgery) Chauncey Cruel, MD OTHER MD:  CHIEF COMPLAINT: Triple negative breast cancer  CURRENT TREATMENT: neoadjuvant chemotherapy  HISTORY OF CURRENT ILLNESS: Brianna Owens [facility] with a 3 month history of a right breast lump that became painful in early 04/2019. Physical exam performed at that time showed a palpable, tender 13 cm lump within the right center breast under the nipple area. She underwent right breast ultrasonography at [facility] on 04/27/2019 showing: 6.1 cm palpable mass centered in the 5 o'clock position of the right breast; single right inferomedial axillary lymph node with focal cortical thickening inferiorly.  Accordingly on 05/04/2019 she proceeded to biopsy of the right breast area in question. The pathology from this procedure ([id number]) showed: invasive ductal carcinoma, grade 2-3. Prognostic indicators significant for: estrogen receptor, 0% negative and progesterone receptor, 0% negative. Proliferation marker Ki67 at 40%.  I do not find HER-2 receptor documentation  The right axillary lymph node biopsied at that time showed reactive germinal centers.  She underwent bilateral diagnostic mammography with tomography at [facility] on 05/05/2019 showing: breast density category D; biopsy-proven right breast malignancy measures 6.1 cm; no additional masses or calcifications seen in the right breast; no evidence of malignancy in the left breast.  The patient's subsequent history is as detailed below.   INTERVAL HISTORY: Brianna Owens was evaluated in  the breast cancer clinic on 05/09/2019 accompanied by her uncle Brianna Owens  She met with Dr. Brantley Stage on 05/06/2019, who recommended MRI (scheduled for 05/18/2019) and port placement in anticipation of neoadjuvant chemotherapy given tumor size.   REVIEW OF SYSTEMS: Brianna Owens reports breast pain.  This is to some extent activity related.  There has been no bleeding or other discharge.  There has been no bleeding or other discharge.  She is not aware of any skin change.  The patient denies unusual headaches, visual changes, nausea, vomiting, stiff neck, dizziness, or gait imbalance. There has been no cough, phlegm production, or pleurisy, no chest pain or pressure, and no change in bowel or bladder habits.  She has some back pain, in the middle of the back.  This is not constant.  The patient denies fever, rash, bleeding, unexplained fatigue or unexplained weight loss. A detailed review of systems was otherwise entirely negative.   PAST MEDICAL HISTORY: No past medical history on file.  PAST SURGICAL HISTORY: No past surgical history on file.  FAMILY HISTORY: Family History  Problem Relation Age of Onset   Breast cancer Mother    Breast cancer Maternal Aunt    Patient's father is 7 and her mother 70 as of November 2020.  The patient's mother was diagnosed with breast cancer in her early 40s.  She lives in [place]. The patient's mother's sister was also diagnosed with breast cancer in her early 36s.  The patient herself has 1 sister, no brothers.  She is not aware of any ovarian cancer cases in the family.  GYNECOLOGIC HISTORY:  No LMP recorded. Menarche: 28 years old Age at first live birth: 28 years old [place] P 2 LMPregular Contraceptive HRT n/a  Hysterectomy? no BSO? no   SOCIAL  HISTORY: (updated 04/2019)  Brianna Owens is currently working as at [employer]  She is originally from [place].  She is divorced.  Her children are [name], 58 years old, living in [place] with his  father, and [name], 40 years old, who lives with the patient.  Also at home are her uncle Brianna Owens, his wife, and that wife's cousin.    ADVANCED DIRECTIVES: Not in place   HEALTH MAINTENANCE: Social History   Tobacco Use   Smoking status: Never Smoker   Smokeless tobacco: Never Used  Substance Use Topics   Alcohol use: Not Currently   Drug use: Not Currently     Colonoscopy: n/a  PAP: 04/26/2019, negative  Bone density: n/a   Not on File  No current outpatient medications on file.   No current facility-administered medications for this visit.     OBJECTIVE: Young Spanish speaker in no acute distress  Vitals:   05/09/19 1416  BP: 133/80  Pulse: 81  Resp: 20  Temp: 98.5 F (36.9 C)  SpO2: 100%     Body mass index is 26.26 kg/m.   Wt Readings from Last 3 Encounters:  05/09/19 157 lb 12.8 oz (71.6 kg)  04/26/19 156 lb (70.8 kg)      ECOG FS:1 - Symptomatic but completely ambulatory  Ocular: Sclerae unicteric, pupils round and equal Ear-nose-throat: Wearing a mask Lymphatic: No cervical or supraclavicular adenopathy Lungs no rales or rhonchi Heart regular rate and rhythm Abd soft, nontender, positive bowel sounds MSK mild focal spinal tenderness mid back, no joint edema Neuro: non-focal, well-oriented, appropriate affect Breasts: There is a large movable mass in the central right breast measuring at least 5 cm, with no associated skin or nipple change.  Left breast is benign.  Both axillae are benign.  Right breast 05/09/2019     LAB RESULTS:  CMP     Component Value Date/Time   NA 139 05/09/2019 1404   K 4.8 05/09/2019 1404   CL 105 05/09/2019 1404   CO2 24 05/09/2019 1404   GLUCOSE 85 05/09/2019 1404   BUN 9 05/09/2019 1404   CREATININE 0.67 05/09/2019 1404   CALCIUM 9.4 05/09/2019 1404   PROT 8.3 (H) 05/09/2019 1404   ALBUMIN 4.3 05/09/2019 1404   AST 49 (H) 05/09/2019 1404   ALT 82 (H) 05/09/2019 1404   ALKPHOS 88 05/09/2019 1404    BILITOT 0.3 05/09/2019 1404   GFRNONAA >60 05/09/2019 1404   GFRAA >60 05/09/2019 1404    No results found for: TOTALPROTELP, ALBUMINELP, A1GS, A2GS, BETS, BETA2SER, GAMS, MSPIKE, SPEI  No results found for: KPAFRELGTCHN, LAMBDASER, KAPLAMBRATIO  Lab Results  Component Value Date   WBC 8.0 05/09/2019   NEUTROABS 5.0 05/09/2019   HGB 12.9 05/09/2019   HCT 38.9 05/09/2019   MCV 91.5 05/09/2019   PLT 315 05/09/2019    _0 @  No results found for: LABCA2  No components found for: OVZCHY850  No results for input(s): INR in the last 168 hours.  No results found for: LABCA2  No results found for: YDX412  No results found for: INO676  No results found for: HMC947  No results found for: CA2729  No components found for: HGQUANT  No results found for: CEA1 / No results found for: CEA1   No results found for: AFPTUMOR  No results found for: CHROMOGRNA  No results found for: PSA1  Appointment on 05/09/2019  Component Date Value Ref Range Status   WBC 05/09/2019 8.0  4.0 - 10.5  K/uL Final   RBC 05/09/2019 4.25  3.87 - 5.11 MIL/uL Final   Hemoglobin 05/09/2019 12.9  12.0 - 15.0 g/dL Final   HCT 05/09/2019 38.9  36.0 - 46.0 % Final   MCV 05/09/2019 91.5  80.0 - 100.0 fL Final   MCH 05/09/2019 30.4  26.0 - 34.0 pg Final   MCHC 05/09/2019 33.2  30.0 - 36.0 g/dL Final   RDW 05/09/2019 12.1  11.5 - 15.5 % Final   Platelets 05/09/2019 315  150 - 400 K/uL Final   nRBC 05/09/2019 0.0  0.0 - 0.2 % Final   Neutrophils Relative % 05/09/2019 64  % Final   Neutro Abs 05/09/2019 5.0  1.7 - 7.7 K/uL Final   Lymphocytes Relative 05/09/2019 27  % Final   Lymphs Abs 05/09/2019 2.1  0.7 - 4.0 K/uL Final   Monocytes Relative 05/09/2019 8  % Final   Monocytes Absolute 05/09/2019 0.6  0.1 - 1.0 K/uL Final   Eosinophils Relative 05/09/2019 1  % Final   Eosinophils Absolute 05/09/2019 0.1  0.0 - 0.5 K/uL Final   Basophils Relative 05/09/2019 0  % Final    Basophils Absolute 05/09/2019 0.0  0.0 - 0.1 K/uL Final   Immature Granulocytes 05/09/2019 0  % Final   Abs Immature Granulocytes 05/09/2019 0.02  0.00 - 0.07 K/uL Final   Performed at [facility], [place] [address] [place], [place] [address]   Sodium 05/09/2019 139  135 - 145 mmol/L Final   Potassium 05/09/2019 4.8  3.5 - 5.1 mmol/L Final   Chloride 05/09/2019 105  98 - 111 mmol/L Final   CO2 05/09/2019 24  22 - 32 mmol/L Final   Glucose, Bld 05/09/2019 85  70 - 99 mg/dL Final   BUN 05/09/2019 9  6 - 20 mg/dL Final   Creatinine, Ser 05/09/2019 0.67  0.44 - 1.00 mg/dL Final   Calcium 05/09/2019 9.4  8.9 - 10.3 mg/dL Final   Total Protein 05/09/2019 8.3* 6.5 - 8.1 g/dL Final   Albumin 05/09/2019 4.3  3.5 - 5.0 g/dL Final   AST 05/09/2019 49* 15 - 41 U/L Final   ALT 05/09/2019 82* 0 - 44 U/L Final   Alkaline Phosphatase 05/09/2019 88  38 - 126 U/L Final   Total Bilirubin 05/09/2019 0.3  0.3 - 1.2 mg/dL Final   GFR calc non Af Amer 05/09/2019 >60  >60 mL/min Final   GFR calc Af Amer 05/09/2019 >60  >60 mL/min Final   Anion gap 05/09/2019 10  5 - 15 Final   Performed at [facility], [place] [address], [place], [place] [address]    (this displays the last labs from the last 3 days)  No results found for: TOTALPROTELP, ALBUMINELP, A1GS, A2GS, BETS, BETA2SER, GAMS, MSPIKE, SPEI (this displays SPEP labs)  No results found for: KPAFRELGTCHN, LAMBDASER, KAPLAMBRATIO (kappa/lambda light chains)  No results found for: HGBA, HGBA2QUANT, HGBFQUANT, HGBSQUAN (Hemoglobinopathy evaluation)   No results found for: LDH  No results found for: IRON, TIBC, IRONPCTSAT (Iron and TIBC)  No results found for: FERRITIN  Urinalysis No results found for: COLORURINE, APPEARANCEUR, LABSPEC, PHURINE, GLUCOSEU, HGBUR, BILIRUBINUR, KETONESUR, PROTEINUR, UROBILINOGEN, NITRITE, LEUKOCYTESUR   STUDIES: US Breast Ltd Uni Right Inc  Axilla  Result Date: 04/27/2019 CLINICAL DATA:  Rapidly enlarging mass felt by the patient in the right breast since September 2020. She reports that the mass was approximately the size of a golf ball when she 1st noticed it. Family  history of breast cancer in the patient's mother and maternal aunt. She is not sure but thinks that her mother was diagnosed with breast cancer in her late 73's. EXAM: ULTRASOUND OF THE RIGHT BREAST COMPARISON:  Previous exam(s). FINDINGS: On physical exam, there is an approximately 7 cm oval, firm, somewhat fixed palpable mass in the inferior retroareolar and periareolar right breast. There are no palpable right axillary lymph nodes. Targeted ultrasound is performed, showing a 6.1 x 6.0 x 3.8 cm oval, horizontally oriented, mildly heterogeneous, hypoechoic mass centered in the 5 o'clock position of the right breast, 3 cm from the nipple. This has some circumscribed and some indistinct and mildly irregular margins. Ultrasound of the right axilla demonstrated multiple normal appearing lymph nodes as well as a lymph node with focal cortical thickening inferiorly, in the inferomedial right axilla. The focally thickened portion measures 4.7 mm in maximum thickness. IMPRESSION: 1. 6.1 cm palpable mass centered in the 5 o'clock position of the right breast with imaging features suspicious for malignancy. 2. Single right inferomedial axillary lymph node with focal cortical thickening inferiorly, suspicious for a metastatic node. RECOMMENDATION: Ultrasound-guided core needle biopsy of the 6.1 cm mass in the 5 o'clock position of the right breast and ultrasound-guided core needle biopsy of the abnormal appearing right axillary lymph node. This has been discussed with the patient and the biopsies have been scheduled at 1:45 p.m. on 05/04/2019. I have discussed the findings and recommendations with the patient. If applicable, a reminder letter will be sent to the patient regarding the next  appointment. BI-RADS CATEGORY  4: Suspicious. Electronically Signed   By: Claudie Revering M.D.   On: 04/27/2019 12:41   [place] Axillary Node Core Biopsy Right  Addendum Date: 05/05/2019   ADDENDUM REPORT: 05/05/2019 14:58 ADDENDUM: Pathology revealed GRADE II/III INVASIVE DUCTAL CARCINOMA of the Right breast, 5 o'clock. This was found to be concordant by Dr. Lovey Newcomer. Pathology revealed LYMPH NODE WITH REACTIVE GERMINAL CENTERS of the Right axilla. This was found to be discordant by Dr. Lovey Newcomer, with targeted node excision recommended. Pathology results were discussed with the patient by telephone by Lattie Corns, Bilingual Patient Services Representative. The patient reported doing well after the biopsy with tenderness at the site. Post biopsy instructions and care were reviewed and questions were answered. The patient was encouraged to call [facility] for any additional concerns. Surgical consultation has been arranged with Dr. Erroll Luna at [facility] on May 06, 2019. The patient returned to [facility] on May 05, 2019 for a bilateral diagnostic mammogram. This procedure is dictated in a separate report. Recommendation for a bilateral breast MRI given her age, breast density and strong family history. Pathology results reported by Terie Purser, RN on 05/05/2019. Electronically Signed   By: Lovey Newcomer M.D.   On: 05/05/2019 14:58   Result Date: 05/05/2019 CLINICAL DATA:  Patient with cortically thickened right axillary lymph node. EXAM: [place] AXILLARY NODE CORE BIOPSY RIGHT COMPARISON:  Previous exam(s). FINDINGS: I met with the patient and we discussed the procedure of ultrasound-guided biopsy, including benefits and alternatives. We discussed the high likelihood of a successful procedure. We discussed the risks of the procedure, including infection, bleeding, tissue injury, clip migration, and inadequate sampling. Informed written consent was  given. The usual time-out protocol was performed immediately prior to the procedure. Using sterile technique and 1% Lidocaine as local anesthetic, under direct ultrasound visualization, a 14 gauge spring-loaded device was used to  perform biopsy of right axillary lymph using a lateral approach. At the conclusion of the procedure [facility] tissue marker clip was deployed into the biopsy cavity. Follow up 2 view mammogram was performed and dictated separately. IMPRESSION: Ultrasound guided biopsy of right axillary lymph node. No apparent complications. Electronically Signed: By: Lovey Newcomer M.D. 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 An abnormal lymph node in the right axilla was biopsied and was negative for metastatic disease, however considered discordant given its abnormal appearance. The patient returns today for bilateral diagnostic mammography as this was not initially performed due to her young age. EXAM: DIGITAL DIAGNOSTIC BILATERAL MAMMOGRAM WITH CAD AND TOMO COMPARISON:  Prior right breast ultrasounds dated 04/27/2019 and 05/04/2019. ACR Breast Density Category d: The breast tissue is extremely dense, which lowers the sensitivity of mammography. FINDINGS: Large round mass within the central to slightly inner right breast containing a Q shaped biopsy marking clip compatible with biopsy proven malignancy measures 6.1 cm. No definite additional masses identified. No calcifications seen in the right breast. An abnormal/enlarged lymph node containing a HydroMARK clip is partially visualized within the right axilla. No suspicious masses or calcifications are identified in the left breast. Mammographic images were processed with CAD. IMPRESSION: 1. Biopsy proven malignancy  in the right breast measures 6.1 cm mammographically with the previously biopsied morphologically abnormal lymph node partially visualized in the right axilla. No additional masses or calcifications seen in the right breast. 2.  No mammographic evidence of malignancy in the left breast. RECOMMENDATION: 1.  Treatment plan for known right breast malignancy. 2. Given extremely dense fibroglandular tissue, consider contrast enhanced breast MRI. The findings and recommendations were discussed with the patient via [place] interpreter, Santa Lighter. The patient is aware of her biopsy results (biopsy performed yesterday) and was informed of her appointment at [facility] surgery scheduled for tomorrow. I have discussed the findings and recommendations with the patient. If applicable, a reminder letter will be sent to the patient regarding the next appointment. BI-RADS CATEGORY  6: Known biopsy-proven malignancy. Electronically Signed   By: Everlean Alstrom M.D.   On: 05/05/2019 14:09   [place] Rt Breast Bx W Loc Dev 1st Lesion Img Bx Spec US Guide  Result Date: 05/04/2019 CLINICAL DATA:  Patient with large palpable right breast mass. EXAM: ULTRASOUND GUIDED RIGHT BREAST CORE NEEDLE BIOPSY COMPARISON:  Previous exam(s). FINDINGS: I met with the patient and we discussed the procedure of ultrasound-guided biopsy, including benefits and alternatives. We discussed the high likelihood of a successful procedure. We discussed the risks of the procedure, including infection, bleeding, tissue injury, clip migration, and inadequate sampling. Informed written consent was given. The usual time-out protocol was performed immediately prior to the procedure. Lesion quadrant: Lower inner quadrant Using sterile technique and 1% Lidocaine as local anesthetic, under direct ultrasound visualization, a 14 gauge spring-loaded device was used to perform biopsy of right breast mass 5 o'clock position using a lateral approach. At the  conclusion of the procedure Q shaped tissue marker clip was deployed into the biopsy cavity. IMPRESSION: Ultrasound guided biopsy of right breast mass. No apparent complications. Electronically Signed   By: Lovey Newcomer M.D.   On: 05/04/2019 14:20    ELIGIBLE FOR AVAILABLE RESEARCH PROTOCOL:no  ASSESSMENT: 28 y.o.  Brianna Owens woman status post right breast overlapping sites biopsy 05/04/2019 for a clinical T3 N0, stage IIIB invasive ductal carcinoma, grade 2, [triple negative], with an MIB-1 of 40%.  (a) HER-2 testing results not available today  (1) genetics testing 05/09/2019  (2) neoadjuvant chemotherapy will consist of doxorubicin and cyclophosphamide in dose dense fashion x4 followed by paclitaxel and carboplatin weekly x12  (3) definitive surgery to follow  (4) adjuvant radiation  PLAN: I spent approximately 60 minutes face to face with Brianna Owens with more than 50% of that time spent in counseling and coordination of care. Specifically we reviewed the biology of the patient's diagnosis and the specifics of her situation.  We first reviewed the fact that cancer is not one disease but more than 100 different diseases and that it is important to keep them separate-- otherwise when friends and relatives discuss their own cancer experiences with Brianna Owens confusion can result. Similarly we explained that if breast cancer spreads to the bone or liver, the patient would not have bone cancer or liver cancer, but breast cancer in the bone and breast cancer in the liver: one cancer in three places-- not 3 different cancers which otherwise would have to be treated in 3 different ways.  We discussed the difference between local and systemic therapy. In terms of loco-regional treatment, lumpectomy plus radiation is equivalent to mastectomy as far as survival is concerned. For this reason, and because the cosmetic results are generally superior, we recommend breast conserving surgery.   We also noted  that in terms of sequencing of treatments, whether systemic therapy or surgery is done first does not affect the ultimate outcome.  This is relevant to Brianna Owens's case since we believe she will benefit from neoadjuvant chemotherapy to help shrink the tumor before surgery, which might allow her to keep the breast.  It will also give her information on the tumors sensitivity to chemotherapy.  We then discussed the rationale for systemic therapy. There is some risk that this cancer may have already spread to other parts of her body.  She will be staged with this chest CT and a bone scan.  The only area where she is symptomatic is the mid back where she has mild discomfort to palpation.  Next we went over the options for systemic therapy which are anti-estrogens, anti-HER-2 immunotherapy, and chemotherapy.  I do not have information on her tumor is HER-2 status at present.  It is however estrogen and progesterone receptor negative.  Accordingly she will need chemotherapy and if the tumor proves HER-2 negative as I expect she will receive cyclophosphamide and doxorubicin in dose dense fashion x4 followed by weekly paclitaxel with carboplatin x12.  She will need an echocardiogram prior to the start of treatment, and will benefit from port placement.  Tentatively we are hoping to be able to start her chemotherapy 05/26/2019  Brianna Owens has a good understanding of the overall plan. She agrees with it. She knows the goal of treatment in her case is cure. She will call with any problems that may develop before her next visit here.   Chauncey Cruel, MD   05/09/2019 5:34 PM Medical Oncology and Hematology [facility] [place], [place] [address] Tel. [phone]    Fax. [phone]   This document serves as a record of services personally performed by Lurline Del, MD. It was created on his behalf by Wilburn Mylar, a trained medical scribe. The creation of this record is  based  on the scribe's personal observations and the provider's statements to them.   I, Lurline Del MD, have reviewed the above documentation for accuracy and completeness, and I agree with the above.

## 2019-05-09 ENCOUNTER — Other Ambulatory Visit: Payer: Self-pay | Admitting: Surgery

## 2019-05-09 ENCOUNTER — Other Ambulatory Visit: Payer: Self-pay | Admitting: *Deleted

## 2019-05-09 ENCOUNTER — Inpatient Hospital Stay: Payer: Self-pay | Attending: Oncology | Admitting: Oncology

## 2019-05-09 ENCOUNTER — Other Ambulatory Visit: Payer: Self-pay

## 2019-05-09 ENCOUNTER — Inpatient Hospital Stay: Payer: No Typology Code available for payment source

## 2019-05-09 ENCOUNTER — Other Ambulatory Visit: Payer: Self-pay | Admitting: Genetic Counselor

## 2019-05-09 VITALS — BP 133/80 | HR 81 | Temp 98.5°F | Resp 20 | Ht 65.0 in | Wt 157.8 lb

## 2019-05-09 DIAGNOSIS — C50811 Malignant neoplasm of overlapping sites of right female breast: Secondary | ICD-10-CM

## 2019-05-09 DIAGNOSIS — Z171 Estrogen receptor negative status [ER-]: Secondary | ICD-10-CM

## 2019-05-09 DIAGNOSIS — C50911 Malignant neoplasm of unspecified site of right female breast: Secondary | ICD-10-CM

## 2019-05-09 DIAGNOSIS — C50812 Malignant neoplasm of overlapping sites of left female breast: Secondary | ICD-10-CM | POA: Insufficient documentation

## 2019-05-09 DIAGNOSIS — Z803 Family history of malignant neoplasm of breast: Secondary | ICD-10-CM

## 2019-05-09 LAB — COMPREHENSIVE METABOLIC PANEL
ALT: 82 U/L — ABNORMAL HIGH (ref 0–44)
AST: 49 U/L — ABNORMAL HIGH (ref 15–41)
Albumin: 4.3 g/dL (ref 3.5–5.0)
Alkaline Phosphatase: 88 U/L (ref 38–126)
Anion gap: 10 (ref 5–15)
BUN: 9 mg/dL (ref 6–20)
CO2: 24 mmol/L (ref 22–32)
Calcium: 9.4 mg/dL (ref 8.9–10.3)
Chloride: 105 mmol/L (ref 98–111)
Creatinine, Ser: 0.67 mg/dL (ref 0.44–1.00)
GFR calc Af Amer: >60 mL/min (ref >60)
GFR calc non Af Amer: >60 mL/min (ref >60)
Glucose, Bld: 85 mg/dL (ref 70–99)
Potassium: 4.8 mmol/L (ref 3.5–5.1)
Sodium: 139 mmol/L (ref 135–145)
Total Bilirubin: 0.3 mg/dL (ref 0.3–1.2)
Total Protein: 8.3 g/dL — ABNORMAL HIGH (ref 6.5–8.1)

## 2019-05-09 LAB — CBC WITH DIFFERENTIAL/PLATELET
Abs Immature Granulocytes: 0.02 10*3/uL (ref 0.00–0.07)
Basophils Absolute: 0 10*3/uL (ref 0.0–0.1)
Basophils Relative: 0 %
Eosinophils Absolute: 0.1 10*3/uL (ref 0.0–0.5)
Eosinophils Relative: 1 %
HCT: 38.9 % (ref 36.0–46.0)
Hemoglobin: 12.9 g/dL (ref 12.0–15.0)
Immature Granulocytes: 0 %
Lymphocytes Relative: 27 %
Lymphs Abs: 2.1 10*3/uL (ref 0.7–4.0)
MCH: 30.4 pg (ref 26.0–34.0)
MCHC: 33.2 g/dL (ref 30.0–36.0)
MCV: 91.5 fL (ref 80.0–100.0)
Monocytes Absolute: 0.6 10*3/uL (ref 0.1–1.0)
Monocytes Relative: 8 %
Neutro Abs: 5 10*3/uL (ref 1.7–7.7)
Neutrophils Relative %: 64 %
Platelets: 315 10*3/uL (ref 150–400)
RBC: 4.25 MIL/uL (ref 3.87–5.11)
RDW: 12.1 % (ref 11.5–15.5)
WBC: 8 10*3/uL (ref 4.0–10.5)
nRBC: 0 % (ref 0.0–0.2)

## 2019-05-09 MED ORDER — LORATADINE 10 MG PO TABS: 10.0000 mg | ORAL_TABLET | Freq: Every day | ORAL | 0 refills | Status: DC

## 2019-05-09 MED ORDER — PROCHLORPERAZINE MALEATE 10 MG PO TABS: 10.0000 mg | ORAL_TABLET | Freq: Four times a day (QID) | ORAL | 1 refills | Status: DC | PRN

## 2019-05-09 MED ORDER — LIDOCAINE-PRILOCAINE 2.5-2.5 % EX CREA: TOPICAL_CREAM | CUTANEOUS | 3 refills | Status: DC

## 2019-05-09 MED ORDER — DEXAMETHASONE 4 MG PO TABS: ORAL_TABLET | ORAL | 1 refills | Status: DC

## 2019-05-09 MED ORDER — PROCHLORPERAZINE MALEATE 10 MG PO TABS: 10.0000 mg | ORAL_TABLET | Freq: Four times a day (QID) | ORAL | 0 refills | Status: DC | PRN

## 2019-05-10 ENCOUNTER — Telehealth (HOSPITAL_COMMUNITY): Payer: Self-pay | Admitting: *Deleted

## 2019-05-10 ENCOUNTER — Encounter (HOSPITAL_COMMUNITY): Payer: Self-pay | Admitting: Oncology

## 2019-05-10 ENCOUNTER — Other Ambulatory Visit (HOSPITAL_COMMUNITY): Payer: Self-pay | Admitting: Surgery

## 2019-05-10 ENCOUNTER — Telehealth: Payer: Self-pay | Admitting: Oncology

## 2019-05-10 ENCOUNTER — Encounter: Payer: Self-pay | Admitting: *Deleted

## 2019-05-10 DIAGNOSIS — C50911 Malignant neoplasm of unspecified site of right female breast: Secondary | ICD-10-CM

## 2019-05-10 LAB — GENETIC SCREENING ORDER

## 2019-05-10 NOTE — Telephone Encounter (Signed)
Called patient with Spanish interpreter Rudene Anda from [facility] to complete [place] Medicaid Application. Patient is not eligible for [place] Medicaid due to patient is not a citizen or resident. Gave patient the phone number to contact the financial counselors at [facility]. Patient verbalized understanding.

## 2019-05-10 NOTE — Telephone Encounter (Signed)
Call interpreters to relay message to patient regarding schedule

## 2019-05-11 ENCOUNTER — Encounter: Payer: Self-pay | Admitting: General Practice

## 2019-05-11 ENCOUNTER — Telehealth: Payer: Self-pay | Admitting: Oncology

## 2019-05-11 NOTE — Telephone Encounter (Signed)
Scheduled appt per 11/24 sch message -   Pt is aware of appt date and time

## 2019-05-11 NOTE — Progress Notes (Signed)
[facility] CSW Progress Notes  Received request from Dr Jana Hakim to assist w navigating access to care for this patient who is newly diagnosed with breast cancer.  She was assessed for [facility] but unfortunately does not qualify.  She is a resident of [place], receives her primary care at [facility].  Spoke w patient by phone w [place] interpreter [name].  Will refer to Juanetta Snow, [employer] [place], for help w accessing any available insurance and/or referral to [facility]  As patient is resident of [place], Ms Sharen Counter may need to refer her to resources in her county of residence.  Patient agreeable to this referral and aware that an additional referral may need to be made.  CSW will meet w patient when she is in infusion on 12/10 to provide information on outside sources of financial assistance.  Patient should be meeting w Red Christians, financial advocate, at her next [facility] visit.  Edwyna Shell, LCSW Clinical Social Worker Phone:  [phone]

## 2019-05-18 ENCOUNTER — Ambulatory Visit
Admission: RE | Admit: 2019-05-18 | Discharge: 2019-05-18 | Disposition: A | Discharge status: Home / Self Care | Payer: No Typology Code available for payment source | Source: Ambulatory Visit | Attending: Surgery | Admitting: Surgery

## 2019-05-18 ENCOUNTER — Other Ambulatory Visit: Payer: Self-pay

## 2019-05-18 DIAGNOSIS — C50911 Malignant neoplasm of unspecified site of right female breast: Secondary | ICD-10-CM

## 2019-05-18 IMAGING — MR MR BREAST BILAT WO/W CM
7 of 11 series · 31 of 48 positions shown · IV contrast (7 ml gadavist)
Comparison: Previous exam(s).

CLINICAL DATA: 28-year-old female with newly diagnosed invasive
ductal carcinoma of the right breast. An abnormal right axillary
lymph node was also biopsied. This was negative for metastatic
disease, but consider discordant. Strong family history of breast
cancer in mother and aunt diagnosed in their 30s.

LABS:  None performed today on site.
EXAM:
BILATERAL BREAST MRI WITH AND WITHOUT CONTRAST
TECHNIQUE: Multiplanar, multisequence MR images of both breasts were obtained
prior to and following the intravenous administration of 7 ml of
Gadavist.

[Series 2: t2_tirm_tra ipat (a-p) · axial · 3.0mm · 0.70mm/px · 1 of 55 slices shown]
[im 1/55]
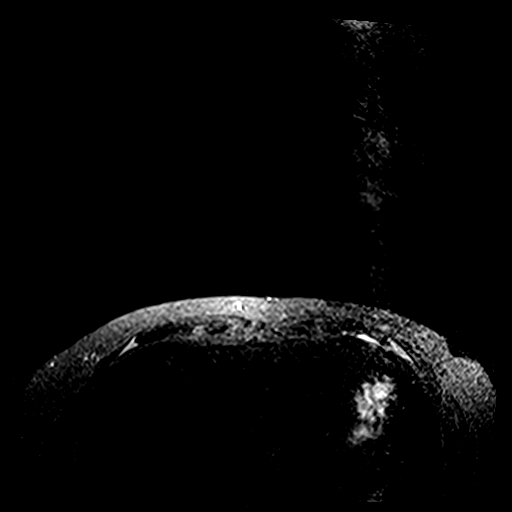

[Series 3: fl3d pre-cm no · axial · non-contrast · 1.2mm · 0.94mm/px · z∈[-96,+76]mm · 5 of 144 slices shown]
[im 1/144]
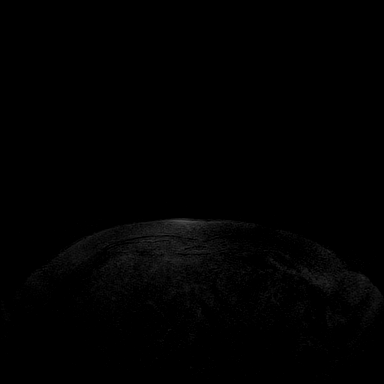
[im 36/144]
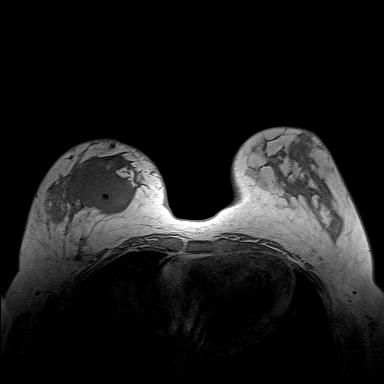
[im 72/144]
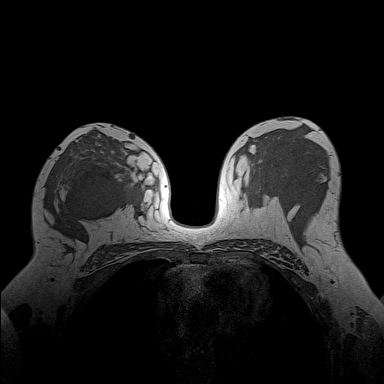
[im 108/144]
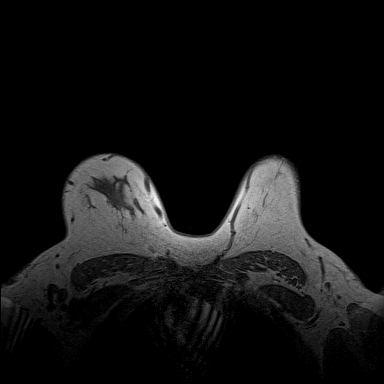
[im 144/144]
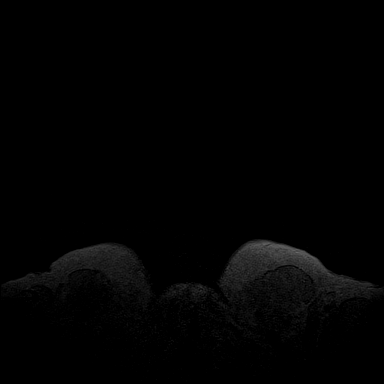

[Series 4: fl3d pre-cm · axial · non-contrast · 1.2mm · 0.94mm/px · z∈[-96,+76]mm · 5 of 144 slices shown]
[im 1/144]
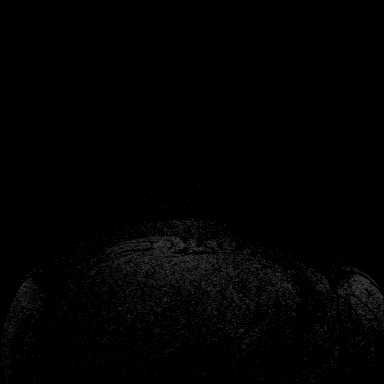
[im 36/144]
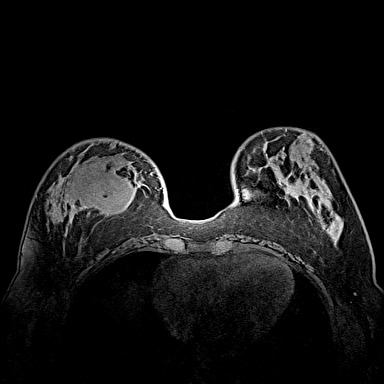
[im 72/144]
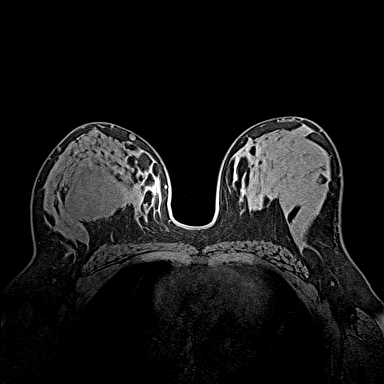
[im 108/144]
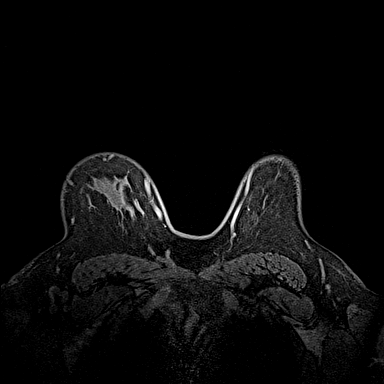
[im 144/144]
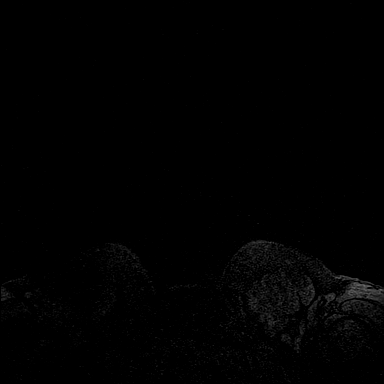

[Series 5: fl3d post-cm 20 · axial · 1.2mm · 0.94mm/px · z∈[-96,+76]mm · 5 of 144 slices shown (1 of 2)]
[im 1/144]
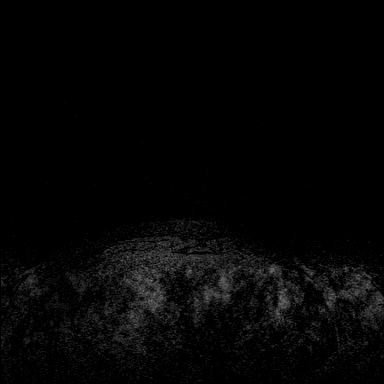
[im 36/144]
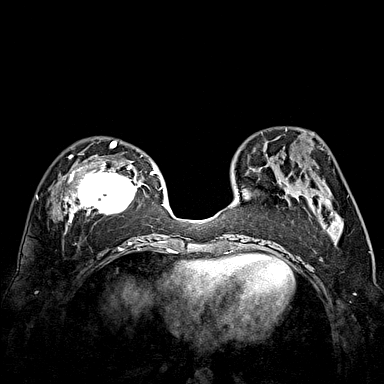
[im 72/144]
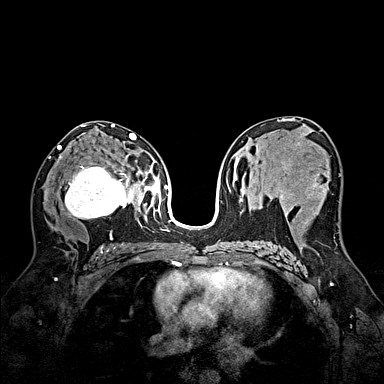
[im 108/144]
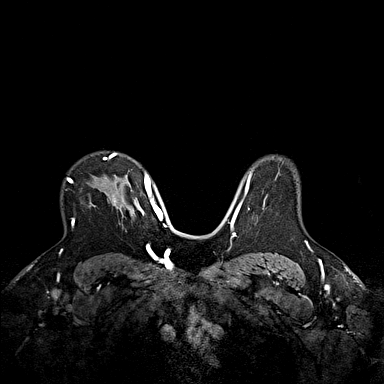
[im 144/144]
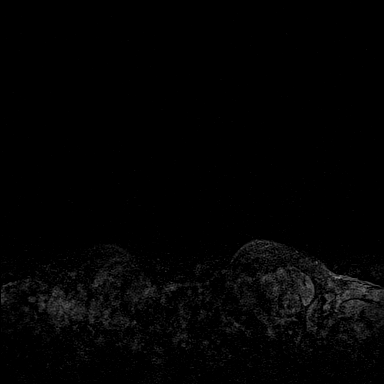

[Series 6: fl3d post-cm 20 · axial · 1.2mm · 0.94mm/px · z∈[-96,+76]mm · 6 of 144 slices shown (2 of 2)]
[im 1/144]
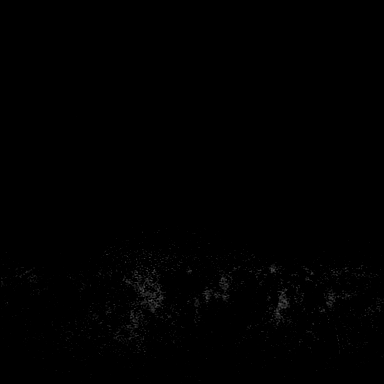
[im 29/144]
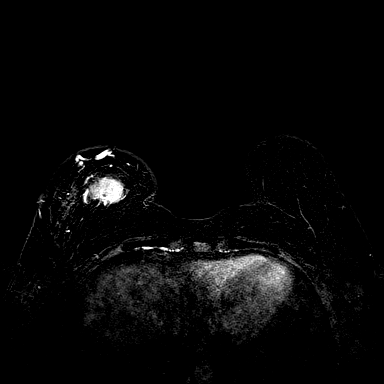
[im 58/144]
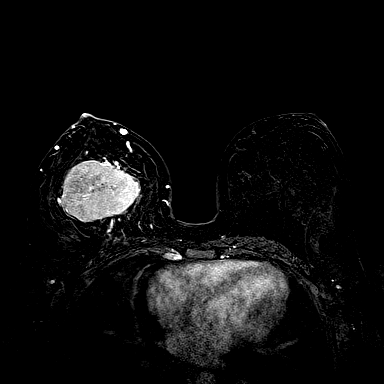
[im 86/144]
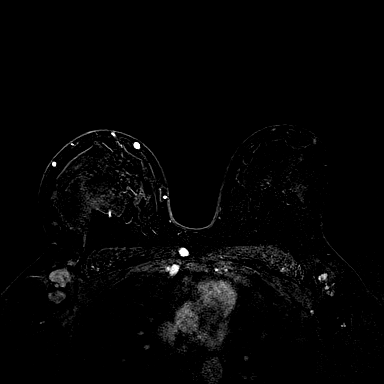
[im 115/144]
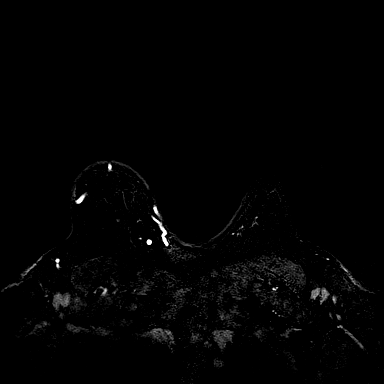
[im 144/144]
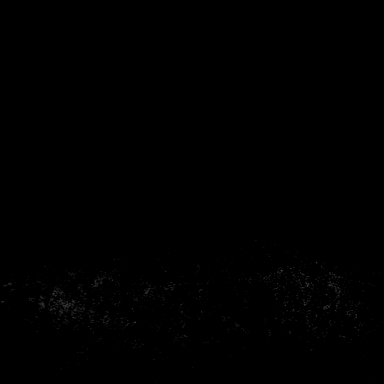

[Series 8: fl3d post-cm 3min · axial · 1.2mm · 0.94mm/px · z∈[-96,+76]mm · 6 of 144 slices shown]
[im 1/144]
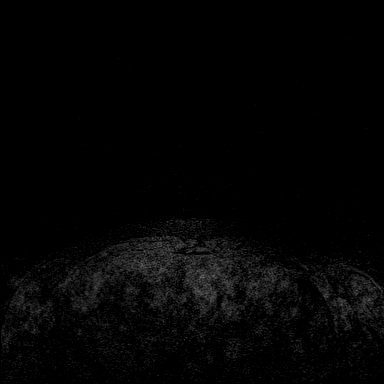
[im 29/144]
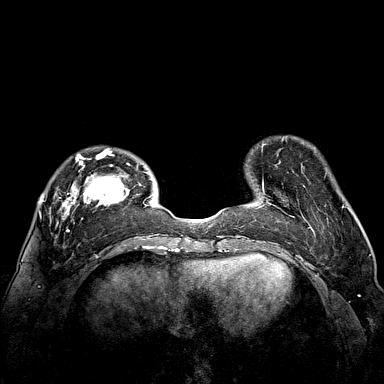
[im 58/144]
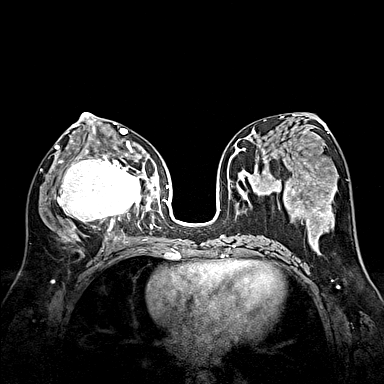
[im 86/144]
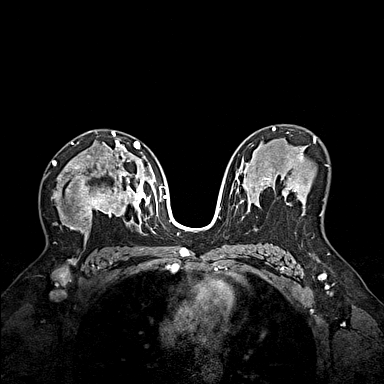
[im 115/144]
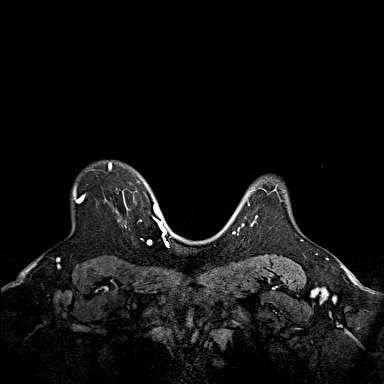
[im 144/144]
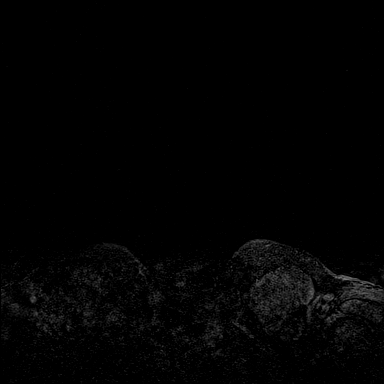

[Series 9: fl3d post-cm 3min_sub · axial · 1.2mm · 0.94mm/px · z∈[-96,-27]mm · 3 of 144 slices shown]
[im 1/144]
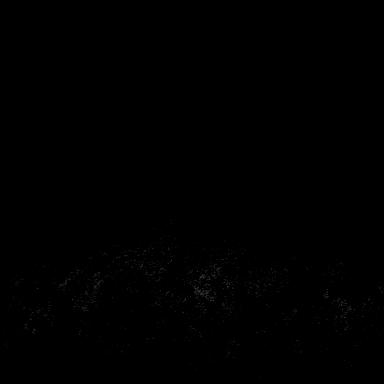
[im 29/144]
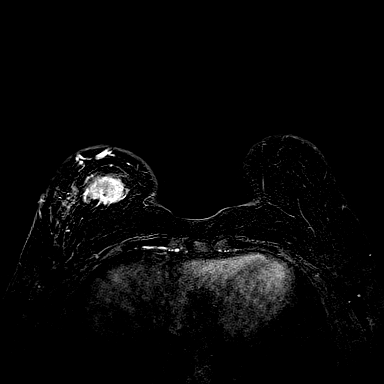
[im 58/144]
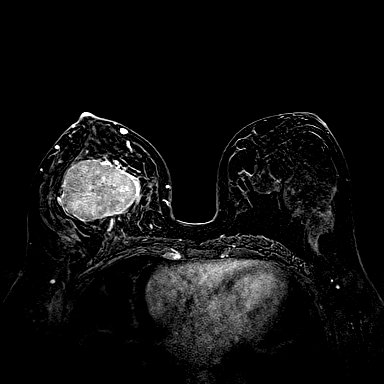

[31 of 48 positions shown; findings below may reference images not displayed]

Three-dimensional MR images were rendered by post-processing of the
original MR data on an independent workstation. The
three-dimensional MR images were interpreted, and findings are
reported in the following complete MRI report for this study. Three
dimensional images were evaluated at the independent [employer]
workstation
FINDINGS: Breast composition: d. Extreme fibroglandular tissue.

Background parenchymal enhancement: Moderate.

Right breast: There is an oval, circumscribed rapidly enhancing mass
in the central right breast consistent with the patient's
biopsy-proven malignancy. It measures 7.1 x 5.6 x 7.0 cm (transverse
by AP by craniocaudal dimensions). No additional suspicious
enhancing masses or non mass enhancement is identified within the
right breast. There is no involvement of the overlying skin or
underlying pectoralis muscle.

Left breast: No suspicious mass or abnormal enhancement.

Lymph nodes: At least 7 morphologically abnormal, suspicious level 1
right axillary lymph nodes are noted (series 5, images 25-62 of
144). Additionally, at least 3 prominent level 3 lymph nodes are
partially visualized along the upper limits of the study (series 5
images 25-29 of 144). These are asymmetric from the contralateral
side and suspicious for involvement of disease. A suspicious 7 x 10
mm right internal mammary chain lymph node is identified centrally
(series 5, image 86 of 144). A higher 9 mm internal mammary chain
node is also asymmetric and suspicious (series 5, image 43/144).

Ancillary findings:  None.
IMPRESSION: 1. 7.1 cm right breast mass consistent with the patient's
biopsy-proven malignancy. No other suspicious findings within the
right breast.
2. At least seven morphologically abnormal level I right axillary
lymph nodes. Three suspicious level III lymph nodes partially
visualized along the superior limits of the study.
3. Two suspicious 9-10 mm right internal mammary chain lymph nodes.
4. No MRI evidence of malignancy on the left.

RECOMMENDATION:
Per clinical treatment plan.

BI-RADS CATEGORY  6: Known biopsy-proven malignancy.

## 2019-05-18 MED ORDER — GADOBUTROL 1 MMOL/ML IV SOLN
7.0000 mL | Freq: Once | INTRAVENOUS | Status: AC | PRN
  Administered 2019-05-18: 7 mL via INTRAVENOUS

## 2019-05-23 ENCOUNTER — Other Ambulatory Visit: Payer: Self-pay | Admitting: Oncology

## 2019-05-23 ENCOUNTER — Other Ambulatory Visit: Payer: Self-pay | Admitting: Student

## 2019-05-23 DIAGNOSIS — Z1501 Genetic susceptibility to malignant neoplasm of breast: Secondary | ICD-10-CM

## 2019-05-23 DIAGNOSIS — Z1509 Genetic susceptibility to other malignant neoplasm: Secondary | ICD-10-CM

## 2019-05-23 NOTE — Progress Notes (Unsigned)
Mr Brianna Owens carries a BRCA1 mutation.  This does not affect the chemotherapy that she is receiving but it may affect her choice of surgery

## 2019-05-24 ENCOUNTER — Encounter (HOSPITAL_COMMUNITY): Payer: Self-pay | Admitting: Interventional Radiology

## 2019-05-24 ENCOUNTER — Other Ambulatory Visit: Payer: Self-pay

## 2019-05-24 ENCOUNTER — Ambulatory Visit (HOSPITAL_COMMUNITY)
Admission: RE | Admit: 2019-05-24 | Discharge: 2019-05-24 | Disposition: A | Discharge status: Home / Self Care | Payer: No Typology Code available for payment source | Source: Ambulatory Visit | Attending: Surgery | Admitting: Surgery

## 2019-05-24 DIAGNOSIS — C50911 Malignant neoplasm of unspecified site of right female breast: Secondary | ICD-10-CM

## 2019-05-24 HISTORY — PX: IR IMAGING GUIDED PORT INSERTION: IMG5740

## 2019-05-24 LAB — CBC
HCT: 36.8 % (ref 36.0–46.0)
Hemoglobin: 12.4 g/dL (ref 12.0–15.0)
MCH: 30.7 pg (ref 26.0–34.0)
MCHC: 33.7 g/dL (ref 30.0–36.0)
MCV: 91.1 fL (ref 80.0–100.0)
Platelets: 290 10*3/uL (ref 150–400)
RBC: 4.04 MIL/uL (ref 3.87–5.11)
RDW: 11.7 % (ref 11.5–15.5)
WBC: 5.4 10*3/uL (ref 4.0–10.5)
nRBC: 0 % (ref 0.0–0.2)

## 2019-05-24 LAB — PROTIME-INR
INR: 1 (ref 0.8–1.2)
Prothrombin Time: 12.7 seconds (ref 11.4–15.2)

## 2019-05-24 LAB — PREGNANCY, URINE: Preg Test, Ur: NEGATIVE

## 2019-05-24 IMAGING — XA IR IMAGING GUIDED PORT INSERTION
1 series · 1 of 1 positions shown · non-contrast
Comparison: None.

INDICATION: History of right-sided breast cancer. In need of durable intravenous
access for chemotherapy administration

EXAM:
IMPLANTED PORT A CATH PLACEMENT WITH ULTRASOUND AND FLUOROSCOPIC
GUIDANCE

[Series 1: single · 1 of 1 slices shown]
[im 1/1]
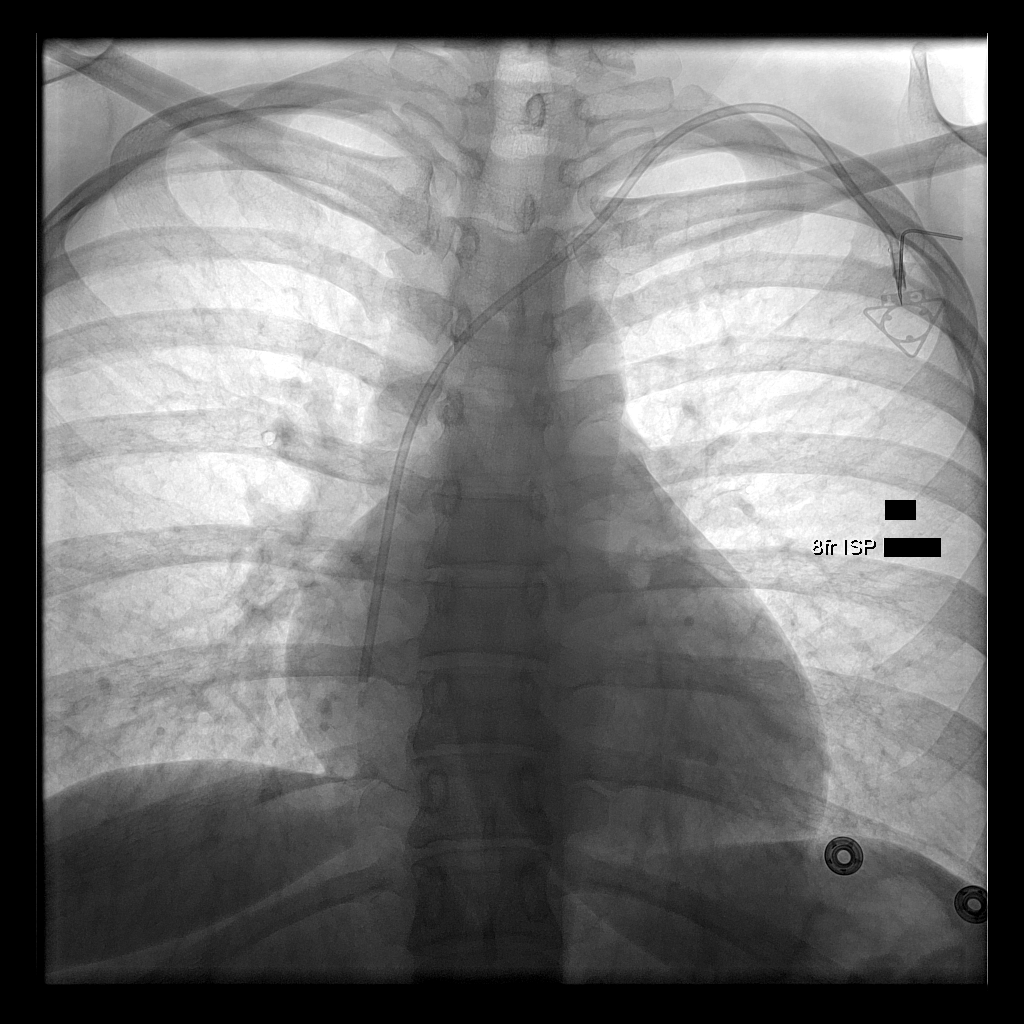

[1 of 1 positions shown; findings below may reference images not displayed]

MEDICATIONS:
Ancef 2 gm IV; The antibiotic was administered within an appropriate
time interval prior to skin puncture.

ANESTHESIA/SEDATION:
Moderate (conscious) sedation was employed during this procedure. A
total of Versed 1.5 mg and Fentanyl 75 mcg was administered
intravenously.

Moderate Sedation Time: 19 minutes. The patient's level of
consciousness and vital signs were monitored continuously by
radiology nursing throughout the procedure under my direct
supervision.

CONTRAST:  None

FLUOROSCOPY TIME:  42 seconds (8 mGy)

COMPLICATIONS:
None immediate.

PROCEDURE:
The procedure, risks, benefits, and alternatives were explained to
the patient via the use of a medical translator. Questions regarding
the procedure were encouraged and answered. The patient understands
and consents to the procedure.

Given the presence of the right-sided breast cancer decision was
made to place a left internal jugular approach port a catheter. As
such, the left neck and chest were prepped with chlorhexidine in a
sterile fashion, and a sterile drape was applied covering the
operative field. Maximum barrier sterile technique with sterile
gowns and gloves were used for the procedure. A timeout was
performed prior to the initiation of the procedure. Local anesthesia
was provided with 1% lidocaine with epinephrine.

After creating a small venotomy incision, a micropuncture kit was
utilized to access the internal jugular vein. Real-time ultrasound
guidance was utilized for vascular access including the acquisition
of a permanent ultrasound image documenting patency of the accessed
vessel. The microwire was utilized to measure appropriate catheter
length.

A subcutaneous port pocket was then created along the upper chest
wall utilizing a combination of sharp and blunt dissection. The
pocket was irrigated with sterile saline. A single lumen ISP power
injectable port was chosen for placement. The 8 Fr catheter was
tunneled from the port pocket site to the venotomy incision. The
port was placed in the pocket. The external catheter was trimmed to
appropriate length. At the venotomy, an 8 Fr peel-away sheath was
placed over a guidewire under fluoroscopic guidance. The catheter
was then placed through the sheath and the sheath was removed. Final
catheter positioning was confirmed and documented with a
fluoroscopic spot radiograph. The port was accessed with CRK
needle, aspirated and flushed with heparinized saline.

The venotomy site was closed with an interrupted 4-0 Vicryl suture.
The port pocket incision was closed with interrupted 2-0 Vicryl
suture and the skin was opposed with a running subcuticular 4-0
Vicryl suture. Dermabond and CRK were applied to both
incisions. Dressings were placed. The patient tolerated the
procedure well without immediate post procedural complication.
FINDINGS: After catheter placement, the tip lies within the superior
cavoatrial junction. The catheter aspirates and flushes normally and
is ready for immediate use.
IMPRESSION: Successful placement of a left internal jugular approach power
injectable Port-A-Cath. The catheter is ready for immediate use.

## 2019-05-24 MED ORDER — SODIUM CHLORIDE 0.9 % IV SOLN
INTRAVENOUS | Status: AC | PRN
  Administered 2019-05-24: 10 mL/h via INTRAVENOUS

## 2019-05-24 MED ORDER — FENTANYL CITRATE (PF) 100 MCG/2ML IJ SOLN
INTRAMUSCULAR | Status: AC | PRN
  Administered 2019-05-24: 25 ug via INTRAVENOUS
  Administered 2019-05-24: 50 ug via INTRAVENOUS

## 2019-05-24 MED ORDER — MIDAZOLAM HCL 2 MG/2ML IJ SOLN
INTRAMUSCULAR | Status: AC | PRN
  Administered 2019-05-24: 0.5 mg via INTRAVENOUS
  Administered 2019-05-24: 1 mg via INTRAVENOUS

## 2019-05-24 MED ORDER — HEPARIN SOD (PORK) LOCK FLUSH 100 UNIT/ML IV SOLN
INTRAVENOUS | Status: AC
  Filled 2019-05-24: qty 5

## 2019-05-24 MED ORDER — CEFAZOLIN SODIUM-DEXTROSE 2-4 GM/100ML-% IV SOLN
INTRAVENOUS | Status: AC
  Administered 2019-05-24: 2000 mg
  Filled 2019-05-24: qty 100

## 2019-05-24 MED ORDER — SODIUM CHLORIDE 0.9 % IV SOLN: INTRAVENOUS | Status: DC

## 2019-05-24 MED ORDER — MIDAZOLAM HCL 2 MG/2ML IJ SOLN
INTRAMUSCULAR | Status: AC
  Filled 2019-05-24: qty 2

## 2019-05-24 MED ORDER — FENTANYL CITRATE (PF) 100 MCG/2ML IJ SOLN
INTRAMUSCULAR | Status: AC
  Filled 2019-05-24: qty 2

## 2019-05-24 MED ORDER — LIDOCAINE-EPINEPHRINE (PF) 1 %-1:200000 IJ SOLN
INTRAMUSCULAR | Status: AC
  Filled 2019-05-24: qty 30

## 2019-05-24 NOTE — Discharge Instructions (Addendum)
° ° ° °Inserción del dispositivo de perfusión implantable, cuidados posteriores °Implanted Port Insertion, Care After °Esta hoja le brinda información sobre cómo cuidarse después del procedimiento. El médico también podrá darle indicaciones más específicas. Comuníquese con el médico si tiene problemas o preguntas. °¿Qué puedo esperar después del procedimiento? °Después del procedimiento, es común tener los siguientes síntomas: °· Molestias en el lugar de la inserción del dispositivo. °· Moretones en la piel alrededor del dispositivo. Esto debería mejorar luego de 3 o 4 días. °Siga estas indicaciones en su casa: °Cuidado del dispositivo °· Luego de que le coloquen el dispositivo, le darán una tarjeta de información del fabricante. La tarjeta contiene información acerca del dispositivo. Llévela siempre con usted. °· Cuide el dispositivo como se lo haya indicado el médico. Pregúntele al médico si usted o un familiar puede recibir capacitación para cuidar del dispositivo en casa. Un enfermero a domicilio también puede cuidar del dispositivo. °· Asegúrese de recordar el tipo de dispositivo que tiene. °Cuidados de las incisiones ° °  ° °· Siga las indicaciones del médico acerca de los cuidados del lugar de la inserción del dispositivo. Asegúrese de hacer lo siguiente: °? Lávese las manos con agua y jabón antes y después de cambiar la venda (vendaje). Use desinfectante para manos si no dispone de agua y jabón. °? Cambie el vendaje como se lo haya indicado el médico. °? No retire los puntos (suturas), la goma para cerrar la piel o las tiras adhesivas. Es posible que estos cierres cutáneos deban permanecer en la piel durante 2 semanas o más tiempo. Si los bordes de las tiras adhesivas empiezan a despegarse y enroscarse, puede recortar los que estén sueltos. No retire las tiras adhesivas por completo a menos que el médico se lo indique. °· Controle el lugar de inserción del dispositivo todos los días para detectar signos de  infección. Esté atento a los siguientes signos: °? Enrojecimiento, hinchazón o dolor. °? Líquido o sangre. °? Calor. °? Pus o mal olor. °Actividad °· Retome sus actividades normales como se lo haya indicado el médico. Pregúntele al médico qué actividades son seguras para usted. °· No levante ningún objeto que pese más de 10 libras (4.5 kg) o que supere el límite de peso que le hayan indicado, hasta que el médico le diga que puede hacerlo. °Indicaciones generales °· Tome los medicamentos de venta libre y los recetados solamente como se lo haya indicado el médico. °· No tome baños de inmersión, no nade ni use el jacuzzi hasta que el médico lo autorice. Pregúntele al médico si puede ducharse. Tal vez solo le permitan darse baños de esponja. °· No conduzca durante 24 horas si le administraron un sedante durante el procedimiento. °· Use un brazalete de alerta médico en caso de emergencia. Esto permitirá que cualquier médico que lo atienda sepa que tiene un dispositivo. °· Concurra a todas las visitas de seguimiento como se lo haya indicado el médico. Esto es importante. °Comuníquese con un médico si: °· No puede purgar el dispositivo con solución salina como se le indicó, o no puede extraer sangre del dispositivo. °· Tiene fiebre o escalofríos. °· Tiene enrojecimiento, hinchazón o dolor alrededor del lugar de la inserción del dispositivo. °· Le sale líquido o sangre del lugar de la inserción del dispositivo. °· El lugar de la inserción del dispositivo está caliente al tacto. °· Tiene pus o percibe mal olor que proviene del lugar de la inserción del dispositivo. °Solicite ayuda inmediatamente si: °· Siente falta de aire o dolor en el   pecho. °· Tiene hemorragia proveniente del lugar donde tiene el dispositivo y no puede controlarla. °Resumen °· Cuide el dispositivo como se lo haya indicado el médico. Tenga la tarjeta información del fabricante con usted en todo momento. °· Cambie el vendaje como se lo haya indicado el  médico. °· Comuníquese con un médico si tiene fiebre o escalofríos o si tiene enrojecimiento, hinchazón o dolor alrededor del lugar de inserción del dispositivo. °· Concurra a todas las visitas de seguimiento como se lo haya indicado el médico. °Esta información no tiene como fin reemplazar el consejo del médico. Asegúrese de hacerle al médico cualquier pregunta que tenga. °Document Released: 03/23/2013 Document Revised: 02/04/2018 Document Reviewed: 02/04/2018 °Elsevier Patient Education © 2020 [employer] ° ° ° °Sedación consciente moderada en los adultos, cuidados posteriores °(Moderate Conscious Sedation, Adult, Care After) °Estas indicaciones le proporcionan información acerca de cómo deberá cuidarse después del procedimiento. El médico también podrá darle instrucciones más específicas. El tratamiento ha sido planificado según las prácticas médicas actuales, pero en algunos casos pueden ocurrir problemas. Comuníquese con el médico si tiene algún problema o dudas después del procedimiento. °QUÉ ESPERAR DESPUÉS DEL PROCEDIMIENTO °Después del procedimiento, es común: °· Sentirse somnoliento durante varias horas. °· Sentirse torpe y tener problemas de equilibrio durante varias horas. °· Perder el sentido de la realidad durante varias horas. °· Vomitar si come muy pronto. °INSTRUCCIONES PARA EL CUIDADO EN EL HOGAR ° °Durante al menos 24 horas después del procedimiento: °· No haga lo siguiente: °? Participar en actividades que impliquen posibles caídas o lesiones. °? Conducir vehículos. °? Operar maquinarias pesadas. °? Beber alcohol. °? Tomar somníferos o medicamentos que causen somnolencia. °? Firmar documentos legales ni tomar decisiones importantes. °? Cuidar a niños por su cuenta. °· Hacer reposo. °Comida y bebida °· Siga la dieta recomendada por el médico. °· Si vomita: °? Pruebe agua, jugo o sopa cuando usted pueda beber sin vomitar. °? Asegúrese de no tener náuseas antes de ingerir alimentos  sólidos. °Instrucciones generales °· Permanezca con un adulto responsable hasta que esté completamente despierto y consciente. °· Tome los medicamentos de venta libre y los recetados solamente como se lo haya indicado el médico. °· Si fuma, no lo haga sin supervisión. °· Concurra a todas las visitas de control como se lo haya indicado el médico. Esto es importante. °SOLICITE ATENCIÓN MÉDICA SI: °· Sigue teniendo náuseas o vomitando. °· Tiene sensación de desvanecimiento. °· Le aparece una erupción cutánea. °· Tiene fiebre. °SOLICITE ATENCIÓN MÉDICA DE INMEDIATO SI: °· Tiene dificultad para respirar. °Esta información no tiene como fin reemplazar el consejo del médico. Asegúrese de hacerle al médico cualquier pregunta que tenga. °Document Released: 06/07/2013 Document Revised: 02/07/2016 Document Reviewed: 09/22/2015 °Elsevier Patient Education © 2020 [employer] ° °

## 2019-05-24 NOTE — Procedures (Signed)
Pre Procedure Dx: Breast cancer Post Procedural Dx: Same  Successful placement of left IJ approach port-a-cath with tip at the superior caval atrial junction. The catheter is ready for immediate use.  Estimated Blood Loss: Minimal  Complications: None immediate.  Jay Seymour Pavlak, MD Pager #: [phone]   

## 2019-05-24 NOTE — H&P (Signed)
Chief Complaint: Patient was seen in consultation today for port placement.  Referring Physician(s): Cornett,Thomas  Supervising Physician: Sandi Mariscal  Patient Status: [facility] - Out-pt  History of Present Illness: Brianna Owens is a 28 y.o. female with recently diagnosed breast cancer who presents today for port placement to begin chemotherapy. The patient's husband states that they first noticed a mass within her right breast earlier this year, however the patient apparently denied ever noting a mass. She was seen by her PCP and an [place] was ordered which showed a 6 cm central breast mass. 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 She was seen by general surgery on 11/20 and oncology on 11/23 with current plan to begin chemotherapy first with surgery to follow. IR has been asked to place a port today for chemotherapy access.  Patient is Spanish speaking only. Patient reports that she believes her first chemotherapy appointment is planned for tomorrow but she is unsure. She complains of right breast pain which is unchanged from her current baseline. She also reports occasionally feeling nauseous when she wakes up in the morning, however she is not nauseous now and has not experienced vomiting when she does have nausea. She has been eating and drinking well at home. She states understanding of the requested procedure and consents to proceed.   No past medical history on file.  No past surgical history on file.  Allergies: Patient has no known allergies.  Medications: Prior to Admission medications   Medication Sig Start Date End Date Taking? Authorizing Provider  dexamethasone (DECADRON) 4 MG tablet Take 2 tablets by mouth once a day on the day after chemotherapy and then take 2 tablets two times a day for 2 days. Take with food. 05/09/19   Magrinat, [name], MD  lidocaine-prilocaine (EMLA) cream Apply to affected area once 05/09/19   Magrinat, [name], MD  loratadine (CLARITIN) 10 MG tablet Take 1 tablet (10 mg total) by mouth daily. 05/09/19   Magrinat, [name], MD  prochlorperazine (COMPAZINE) 10 MG tablet Take 1 tablet (10 mg total) by mouth every 6 (six) hours as needed (Nausea or vomiting). 05/09/19   Magrinat, [name], MD  prochlorperazine (COMPAZINE) 10 MG tablet Take 1 tablet (10 mg total) by mouth every 6 (six) hours as needed for nausea or vomiting. 05/09/19   Magrinat, [name], MD     Family History  Problem Relation Age of Onset   Breast cancer Mother    Breast cancer Maternal Aunt     Social History   Socioeconomic History   Marital status: Single    Spouse name: Not on file   Number of children: Not on file   Years of education: Not on file   Highest education level: 9th grade  Occupational History   Not on file  Social Needs   Financial resource strain: Not on file   Food insecurity    Worry: Not on file    Inability: Not on file   Transportation needs    Medical: Yes    Non-medical: No  Tobacco Use   Smoking status: Never Smoker   Smokeless tobacco: Never Used  Substance and Sexual Activity   Alcohol use: Not Currently   Drug use: Not Currently   Sexual activity: Yes    Birth control/protection: I.U.D.  Lifestyle   Physical activity    Days per week: Not on file    Minutes per session: Not on file  Stress: Not on file  Relationships   Social connections    Talks on phone: Not on file    Gets together: Not on file    Attends religious service: Not on file    Active member of club or organization: Not on file    Attends meetings of clubs or organizations: Not on file    Relationship status: Not on file  Other Topics Concern   Not on file  Social History Narrative   Not on file     Review of Systems: A 12 point ROS discussed and pertinent positives are indicated in the HPI above.  All other systems are negative.  Review of Systems  Constitutional: Negative for  appetite change, chills and fever.  Respiratory: Negative for cough and shortness of breath.   Cardiovascular: Negative for chest pain.  Gastrointestinal: Positive for nausea (occasional - none currently). Negative for abdominal pain, blood in stool, diarrhea and vomiting.  Musculoskeletal: Negative for back pain.  Skin: Negative for rash and wound.       (+) right breast pain  Neurological: Negative for dizziness and headaches.    Vital Signs: BP (!) 142/82    Pulse 71    Temp 98.3 F (36.8 C) (Oral)    Resp 14    Ht '5\' 5"'  (1.651 m)    Wt 157 lb 13.6 oz (71.6 kg)    SpO2 100%    BMI 26.27 kg/m   Physical Exam Vitals signs reviewed.  Constitutional:      General: She is not in acute distress.    Appearance: She is not ill-appearing.  HENT:     Head: Normocephalic.     Mouth/Throat:     Mouth: Mucous membranes are moist.     Pharynx: Oropharynx is clear. No oropharyngeal exudate or posterior oropharyngeal erythema.  Cardiovascular:     Rate and Rhythm: Normal rate and regular rhythm.  Pulmonary:     Effort: Pulmonary effort is normal.     Breath sounds: Normal breath sounds.  Abdominal:     General: Bowel sounds are normal. There is no distension.     Palpations: Abdomen is soft.     Tenderness: There is no abdominal tenderness.  Skin:    General: Skin is warm and dry.  Neurological:     Mental Status: She is alert and oriented to person, place, and time.  Psychiatric:        Mood and Affect: Mood normal.        Behavior: Behavior normal.        Thought Content: Thought content normal.        Judgment: Judgment normal.      MD Evaluation Airway: WNL Heart: WNL Abdomen: WNL Chest/ Lungs: WNL ASA  Classification: 2 Mallampati/Airway Score: Two   Imaging: Mr Breast Bilateral W Wo Contrast Inc Cad  Result Date: 05/18/2019 CLINICAL DATA:  28 year old female with newly diagnosed invasive ductal carcinoma of the right breast. An abnormal right axillary lymph node  was also biopsied. This was negative for metastatic disease, but consider discordant. Strong family history of breast cancer in mother and aunt diagnosed in their 67s. LABS:  None performed today on site. EXAM: BILATERAL BREAST MRI WITH AND WITHOUT CONTRAST TECHNIQUE: Multiplanar, multisequence MR images of both breasts were obtained prior to and following the intravenous administration of 7 ml of Gadavist. Three-dimensional MR images were rendered by post-processing of the original MR data on an independent workstation. The three-dimensional MR images were  interpreted, and findings are reported in the following complete MRI report for this study. Three dimensional images were evaluated at the independent DynaCad workstation COMPARISON:  Previous exam(s). FINDINGS: Breast composition: d. Extreme fibroglandular tissue. Background parenchymal enhancement: Moderate. Right breast: There is an oval, circumscribed rapidly enhancing mass in the central right breast consistent with the patient's biopsy-proven malignancy. It measures 7.1 x 5.6 x 7.0 cm (transverse by AP by craniocaudal dimensions). No additional suspicious enhancing masses or non mass enhancement is identified within the right breast. There is no involvement of the overlying skin or underlying pectoralis muscle. Left breast: No suspicious mass or abnormal enhancement. Lymph nodes: At least 7 morphologically abnormal, suspicious level 1 right axillary lymph nodes are noted (series 5, images 25-62 of 144). Additionally, at least 3 prominent level 3 lymph nodes are partially visualized along the upper limits of the study (series 5 images 25-29 of 144). These are asymmetric from the contralateral side and suspicious for involvement of disease. A suspicious 7 x 10 mm right internal mammary chain lymph node is identified centrally (series 5, image 86 of 144). A higher 9 mm internal mammary chain node is also asymmetric and suspicious (series 5, image 43/144).  Ancillary findings:  None. IMPRESSION: 1. 7.1 cm right breast mass consistent with the patient's biopsy-proven malignancy. No other suspicious findings within the right breast. 2. At least seven morphologically abnormal level I right axillary lymph nodes. Three suspicious level III lymph nodes partially visualized along the superior limits of the study. 3. Two suspicious 9-10 mm right internal mammary chain lymph nodes. 4. No MRI evidence of malignancy on the left. RECOMMENDATION: Per clinical treatment plan. BI-RADS CATEGORY  6: Known biopsy-proven malignancy. Electronically Signed   By: Kristopher Oppenheim M.D.   On: 05/18/2019 14:30   US Breast Ltd Uni Right Inc Axilla  Result Date: 04/27/2019 CLINICAL DATA:  Rapidly enlarging mass felt by the patient in the right breast since September 2020. She reports that the mass was approximately the size of a golf ball when she 1st noticed it. Family history of breast cancer in the patient's mother and maternal aunt. She is not sure but thinks that her mother was diagnosed with breast cancer in her late 47's. EXAM: ULTRASOUND OF THE RIGHT BREAST COMPARISON:  Previous exam(s). FINDINGS: On physical exam, there is an approximately 7 cm oval, firm, somewhat fixed palpable mass in the inferior retroareolar and periareolar right breast. There are no palpable right axillary lymph nodes. Targeted ultrasound is performed, showing a 6.1 x 6.0 x 3.8 cm oval, horizontally oriented, mildly heterogeneous, hypoechoic mass centered in the 5 o'clock position of the right breast, 3 cm from the nipple. This has some circumscribed and some indistinct and mildly irregular margins. Ultrasound of the right axilla demonstrated multiple normal appearing lymph nodes as well as a lymph node with focal cortical thickening inferiorly, in the inferomedial right axilla. The focally thickened portion measures 4.7 mm in maximum thickness. IMPRESSION: 1. 6.1 cm palpable mass centered in the 5 o'clock  position of the right breast with imaging features suspicious for malignancy. 2. Single right inferomedial axillary lymph node with focal cortical thickening inferiorly, suspicious for a metastatic node. RECOMMENDATION: Ultrasound-guided core needle biopsy of the 6.1 cm mass in the 5 o'clock position of the right breast and ultrasound-guided core needle biopsy of the abnormal appearing right axillary lymph node. This has been discussed with the patient and the biopsies have been scheduled at 1:45 p.m. on 05/04/2019. I have  discussed the findings and recommendations with the patient. If applicable, a reminder letter will be sent to the patient regarding the next appointment. BI-RADS CATEGORY  4: Suspicious. Electronically Signed   By: Claudie Revering M.D.   On: 04/27/2019 12:41   [place] Axillary Node Core Biopsy Right  Addendum Date: 05/05/2019   ADDENDUM REPORT: 05/05/2019 14:58 ADDENDUM: Pathology revealed GRADE II/III INVASIVE DUCTAL CARCINOMA of the Right breast, 5 o'clock. This was found to be concordant by Dr. Lovey Newcomer. Pathology revealed LYMPH NODE WITH REACTIVE GERMINAL CENTERS of the Right axilla. This was found to be discordant by Dr. Lovey Newcomer, with targeted node excision recommended. Pathology results were discussed with the patient by telephone by Lattie Corns, Bilingual Patient Services Representative. The patient reported doing well after the biopsy with tenderness at the site. Post biopsy instructions and care were reviewed and questions were answered. The patient was encouraged to call [facility] for any additional concerns. Surgical consultation has been arranged with Dr. Erroll Luna at [facility] on May 06, 2019. The patient returned to [facility] on May 05, 2019 for a bilateral diagnostic mammogram. This procedure is dictated in a separate report. Recommendation for a bilateral breast MRI given her age, breast density and strong  family history. Pathology results reported by Terie Purser, RN on 05/05/2019. Electronically Signed   By: Lovey Newcomer M.D.   On: 05/05/2019 14:58   Result Date: 05/05/2019 CLINICAL DATA:  Patient with cortically thickened right axillary lymph node. EXAM: [place] AXILLARY NODE CORE BIOPSY RIGHT COMPARISON:  Previous exam(s). FINDINGS: I met with the patient and we discussed the procedure of ultrasound-guided biopsy, including benefits and alternatives. We discussed the high likelihood of a successful procedure. We discussed the risks of the procedure, including infection, bleeding, tissue injury, clip migration, and inadequate sampling. Informed written consent was given. The usual time-out protocol was performed immediately prior to the procedure. Using sterile technique and 1% Lidocaine as local anesthetic, under direct ultrasound visualization, a 14 gauge spring-loaded device was used to perform biopsy of right axillary lymph using a lateral approach. At the conclusion of the procedure [facility] tissue marker clip was deployed into the biopsy cavity. Follow up 2 view mammogram was performed and dictated separately. IMPRESSION: Ultrasound guided biopsy of right axillary lymph node. No apparent complications. Electronically Signed: By: Lovey Newcomer M.D. 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 An abnormal lymph node in the right axilla was biopsied and was negative for metastatic disease, however considered discordant given its abnormal appearance. The patient returns today for bilateral diagnostic mammography as this was not initially performed due to her young age. EXAM: DIGITAL DIAGNOSTIC BILATERAL MAMMOGRAM WITH CAD AND TOMO COMPARISON:  Prior right breast ultrasounds dated  04/27/2019 and 05/04/2019. ACR Breast Density Category d: The breast tissue is extremely dense, which lowers the sensitivity of mammography. FINDINGS: Large round mass within the central to slightly inner right breast containing a Q shaped biopsy marking clip compatible with biopsy proven malignancy measures 6.1 cm. No definite additional masses identified. No calcifications seen in the right breast. An abnormal/enlarged lymph node containing a HydroMARK clip is partially visualized within the right axilla. No suspicious masses or calcifications are identified in the left breast.  Mammographic images were processed with CAD. IMPRESSION: 1. Biopsy proven malignancy in the right breast measures 6.1 cm mammographically with the previously biopsied morphologically abnormal lymph node partially visualized in the right axilla. No additional masses or calcifications seen in the right breast. 2.  No mammographic evidence of malignancy in the left breast. RECOMMENDATION: 1.  Treatment plan for known right breast malignancy. 2. Given extremely dense fibroglandular tissue, consider contrast enhanced breast MRI. The findings and recommendations were discussed with the patient via Jamul interpreter, Santa Lighter. The patient is aware of her biopsy results (biopsy performed yesterday) and was informed of her appointment at [facility] scheduled for tomorrow. I have discussed the findings and recommendations with the patient. If applicable, a reminder letter will be sent to the patient regarding the next appointment. BI-RADS CATEGORY  6: Known biopsy-proven malignancy. Electronically Signed   By: Everlean Alstrom M.D.   On: 05/05/2019 14:09   [place] Rt Breast Bx W Loc Dev 1st Lesion Img Bx Spec US Guide  Result Date: 05/04/2019 CLINICAL DATA:  Patient with large palpable right breast mass. EXAM: ULTRASOUND GUIDED RIGHT BREAST CORE NEEDLE BIOPSY COMPARISON:  Previous exam(s). FINDINGS: I met with the patient and we  discussed the procedure of ultrasound-guided biopsy, including benefits and alternatives. We discussed the high likelihood of a successful procedure. We discussed the risks of the procedure, including infection, bleeding, tissue injury, clip migration, and inadequate sampling. Informed written consent was given. The usual time-out protocol was performed immediately prior to the procedure. Lesion quadrant: Lower inner quadrant Using sterile technique and 1% Lidocaine as local anesthetic, under direct ultrasound visualization, a 14 gauge spring-loaded device was used to perform biopsy of right breast mass 5 o'clock position using a lateral approach. At the conclusion of the procedure Q shaped tissue marker clip was deployed into the biopsy cavity. IMPRESSION: Ultrasound guided biopsy of right breast mass. No apparent complications. Electronically Signed   By: Lovey Newcomer M.D.   On: 05/04/2019 14:20    Labs:  CBC: Recent Labs    05/09/19 1404 05/24/19 1030  WBC 8.0 5.4  HGB 12.9 12.4  HCT 38.9 36.8  PLT 315 290    COAGS: Recent Labs    05/24/19 1030  INR 1.0    BMP: Recent Labs    05/09/19 1404  NA 139  K 4.8  CL 105  CO2 24  GLUCOSE 85  BUN 9  CALCIUM 9.4  CREATININE 0.67  GFRNONAA >60  GFRAA >60    LIVER FUNCTION TESTS: Recent Labs    05/09/19 1404  BILITOT 0.3  AST 49*  ALT 82*  ALKPHOS 88  PROT 8.3*  ALBUMIN 4.3    TUMOR MARKERS: No results for input(s): AFPTM, CEA, CA199, CHROMGRNA in the last 8760 hours.  Assessment and Plan:  28 y/o F with recently diagnosed invasive ductal carcinoma planned for initial chemotherapy followed by surgery. IR has been asked to place a port today to initiate chemotherapy.  Patient has been NPO since midnight, she does not take any blood thinning medications. Afebrile, WBC 5.4, hgb 12.4, plt 290, INR 1.0, UPT (-).  Risks and benefits of image guided port-a-catheter placement were discussed with the patient including, but not  limited to bleeding, infection, pneumothorax, or fibrin sheath development and need for additional procedures.  All of the patient's questions were answered, patient is agreeable to proceed.  Consent signed and in chart.  Thank you for this interesting consult.  I greatly enjoyed meeting  [place] and look forward to participating in their care.  A copy of this report was sent to the requesting provider on this date.  Electronically Signed: Joaquim Nam, PA-C 05/24/2019, 11:32 AM   I spent a total of  30 Minutes  in face to face in clinical consultation, greater than 50% of which was counseling/coordinating care for port placement.

## 2019-05-25 ENCOUNTER — Other Ambulatory Visit: Payer: Self-pay

## 2019-05-25 ENCOUNTER — Encounter: Payer: Self-pay | Admitting: Oncology

## 2019-05-25 ENCOUNTER — Ambulatory Visit (HOSPITAL_COMMUNITY)
Admission: RE | Admit: 2019-05-25 | Discharge: 2019-05-25 | Disposition: A | Discharge status: Home / Self Care | Payer: No Typology Code available for payment source | Source: Ambulatory Visit | Attending: Oncology | Admitting: Oncology

## 2019-05-25 ENCOUNTER — Ambulatory Visit (HOSPITAL_BASED_OUTPATIENT_CLINIC_OR_DEPARTMENT_OTHER)
Admission: RE | Admit: 2019-05-25 | Discharge: 2019-05-25 | Disposition: A | Discharge status: Home / Self Care | Payer: Self-pay | Source: Ambulatory Visit | Attending: Oncology | Admitting: Oncology

## 2019-05-25 ENCOUNTER — Inpatient Hospital Stay: Payer: No Typology Code available for payment source | Attending: Oncology

## 2019-05-25 DIAGNOSIS — C50811 Malignant neoplasm of overlapping sites of right female breast: Secondary | ICD-10-CM

## 2019-05-25 DIAGNOSIS — Z3202 Encounter for pregnancy test, result negative: Secondary | ICD-10-CM | POA: Insufficient documentation

## 2019-05-25 DIAGNOSIS — Z171 Estrogen receptor negative status [ER-]: Secondary | ICD-10-CM

## 2019-05-25 DIAGNOSIS — Z7689 Persons encountering health services in other specified circumstances: Secondary | ICD-10-CM | POA: Insufficient documentation

## 2019-05-25 DIAGNOSIS — R110 Nausea: Secondary | ICD-10-CM | POA: Insufficient documentation

## 2019-05-25 DIAGNOSIS — Z5111 Encounter for antineoplastic chemotherapy: Secondary | ICD-10-CM | POA: Insufficient documentation

## 2019-05-25 DIAGNOSIS — Z803 Family history of malignant neoplasm of breast: Secondary | ICD-10-CM | POA: Insufficient documentation

## 2019-05-25 DIAGNOSIS — Z7952 Long term (current) use of systemic steroids: Secondary | ICD-10-CM | POA: Insufficient documentation

## 2019-05-25 DIAGNOSIS — F419 Anxiety disorder, unspecified: Secondary | ICD-10-CM | POA: Insufficient documentation

## 2019-05-25 DIAGNOSIS — Z79899 Other long term (current) drug therapy: Secondary | ICD-10-CM | POA: Insufficient documentation

## 2019-05-25 DIAGNOSIS — Z1501 Genetic susceptibility to malignant neoplasm of breast: Secondary | ICD-10-CM | POA: Insufficient documentation

## 2019-05-25 IMAGING — NM NM BONE WHOLE BODY
2 series · 2 of 2 positions shown · non-contrast
Comparison: CT [DATE]

CLINICAL DATA: EOV 19.6 MCI TC99 MDP LAC @[NQ]/CS DX: R BREST CA,
METS SUSPECTED PT STATES NO PAIN, NO BONE SX, NO FALLS, NO
FX^[NQ] TC-MDP TECHNETIUM TC 99M MEDRONATE IV KITBreast
cancer, mets suspected, staging

EXAM:
NUCLEAR MEDICINE WHOLE BODY BONE SCAN
TECHNIQUE: Whole body anterior and posterior images were obtained approximately
3 hours after intravenous injection of radiopharmaceutical.
RADIOPHARMACEUTICALS:  19.6 mCi [NQ] MDP IV

[Series 1: whole body · 2.66mm/px · 1 of 1 slices shown (1 of 2)]
[im 1/1]
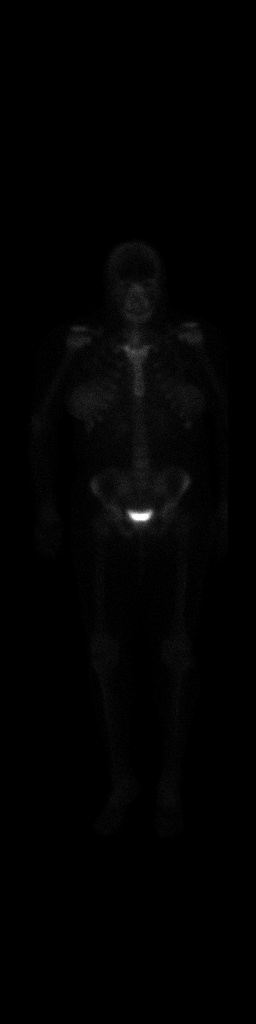

[Series 1: whole body · 2.66mm/px · 1 of 1 slices shown (2 of 2)]
[im 1/1]
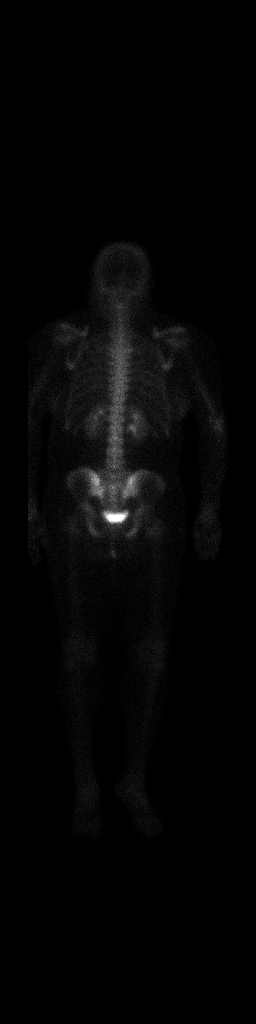

[2 of 2 positions shown; findings below may reference images not displayed]

FINDINGS: No abnormal radiotracer accumulation within the axillary or
appendicular skeleton to suggest metastatic disease. Sacrum
partially obscured by bladder activity.
IMPRESSION: No scintigraphic evidence skeletal metastasis.

## 2019-05-25 IMAGING — CT CT CHEST W/ CM
2 of 3 series · 15 of 36 positions shown, 18 images · IV contrast (omnipaque)
Comparison: None.

CLINICAL DATA: Breast cancer, staging.

EXAM:
CT CHEST WITH CONTRAST
TECHNIQUE: Multidetector CT imaging of the chest was performed during
intravenous contrast administration.
CONTRAST:  75mL OMNIPAQUE IOHEXOL 300 MG/ML  SOLN

[Series 2: axial st · axial · 0.75mm/px · z∈[+1221,+1409]mm · 12 of 112 slices shown, 15 images]
[im 9/112  mediastinal]
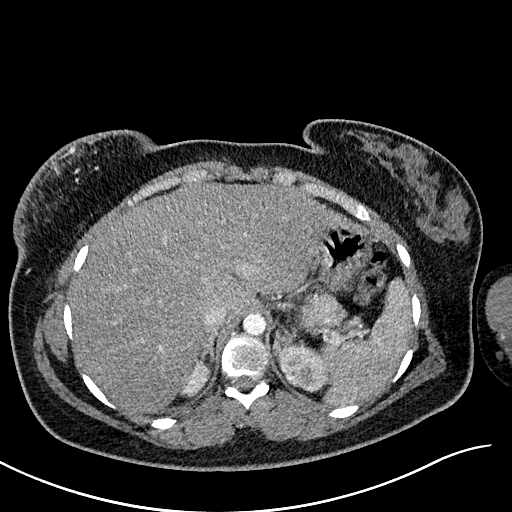
[im 9/112  lung]
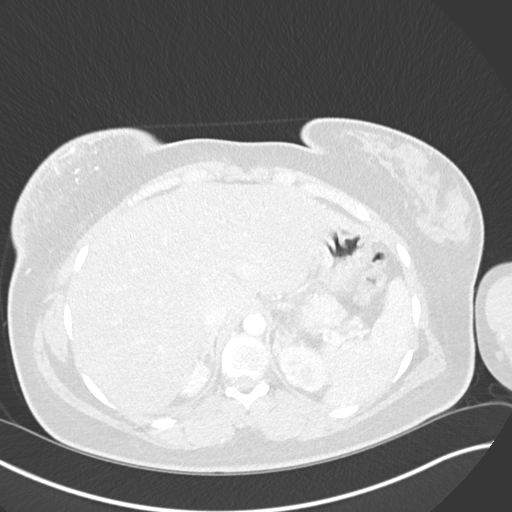
[im 17/112  lung]
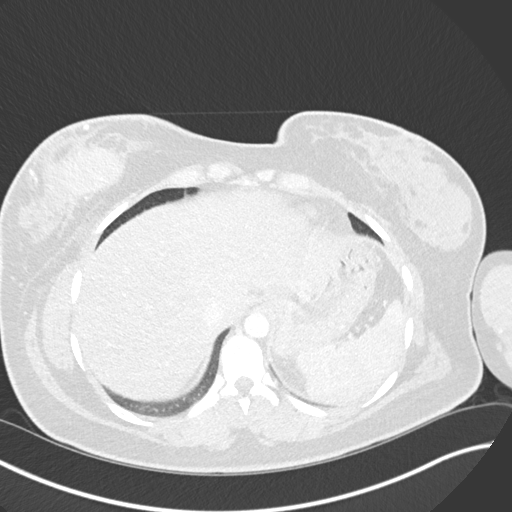
[im 25/112  lung]
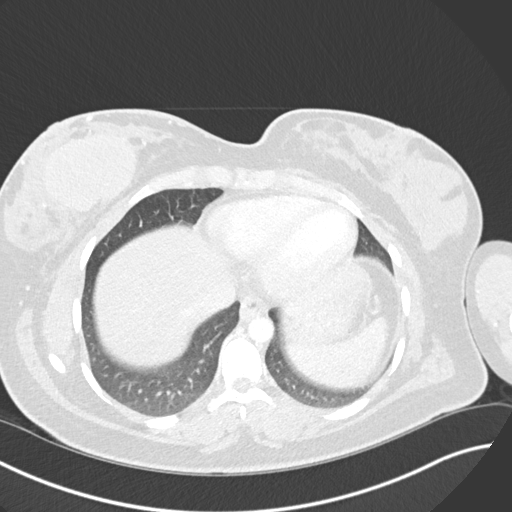
[im 33/112  lung]
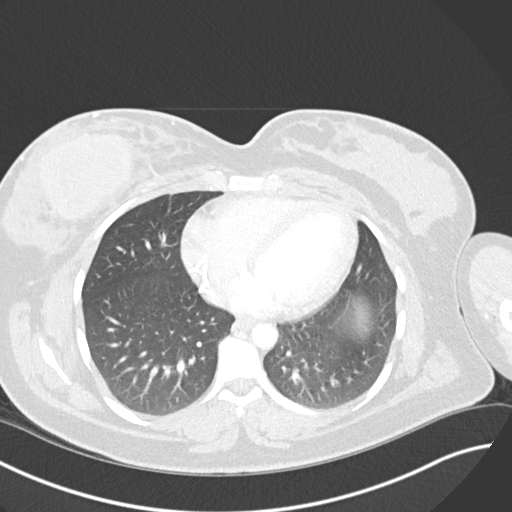
[im 42/112  mediastinal]
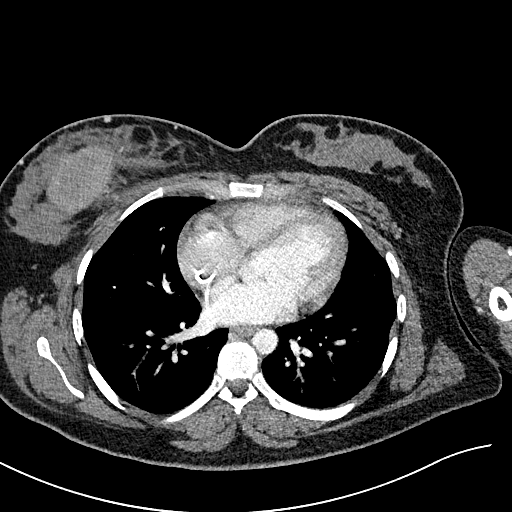
[im 42/112  lung]
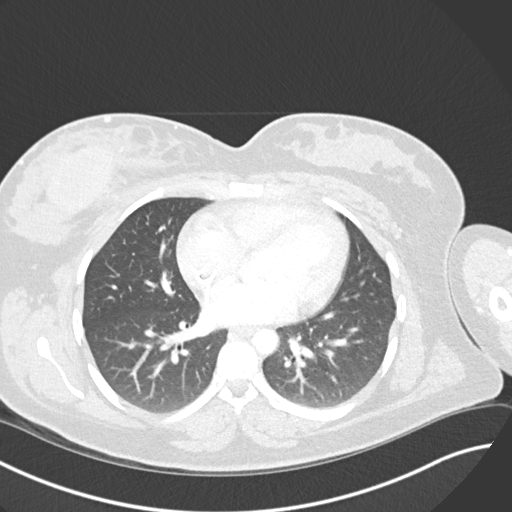
[im 50/112  lung]
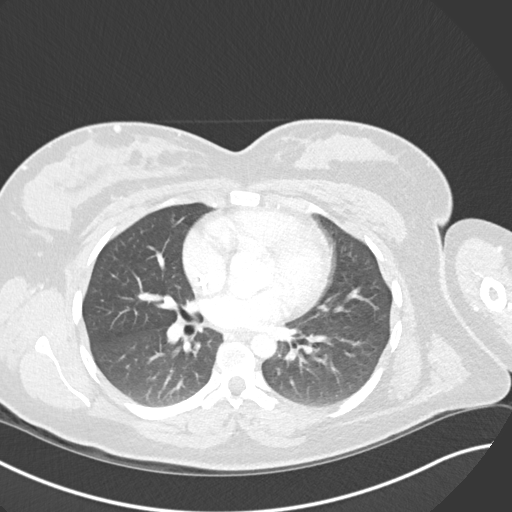
[im 62/112  lung]
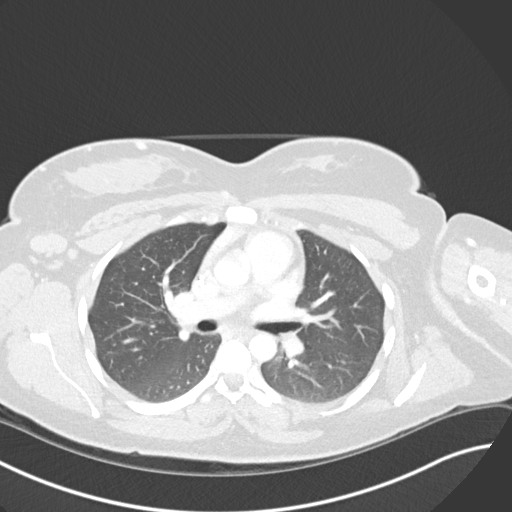
[im 70/112  lung]
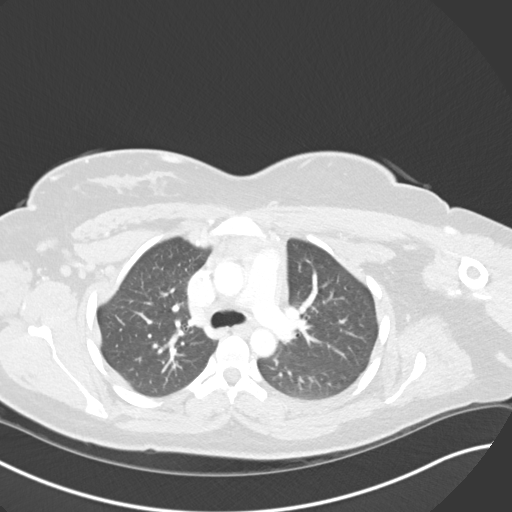
[im 79/112  mediastinal]
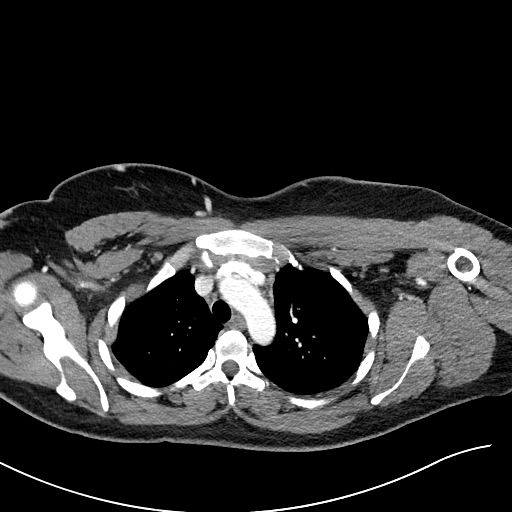
[im 79/112  lung]
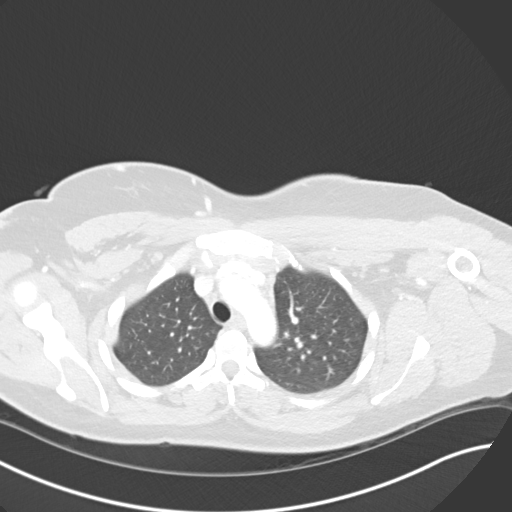
[im 87/112  lung]
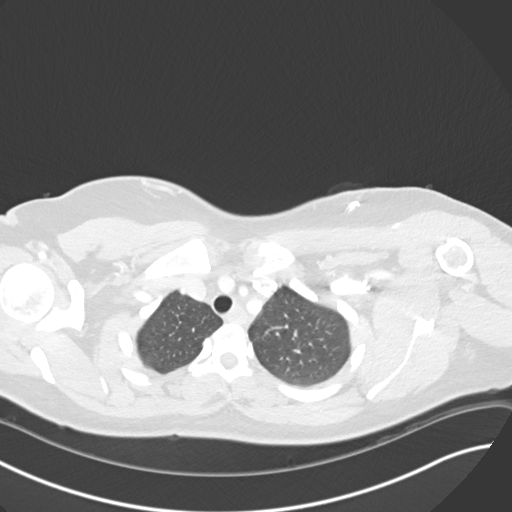
[im 95/112  lung]
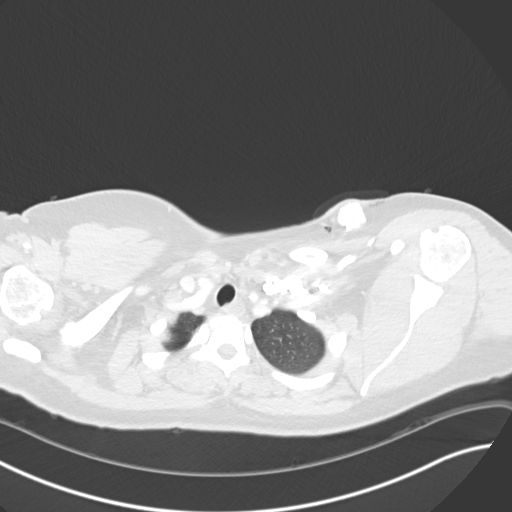
[im 103/112  lung]
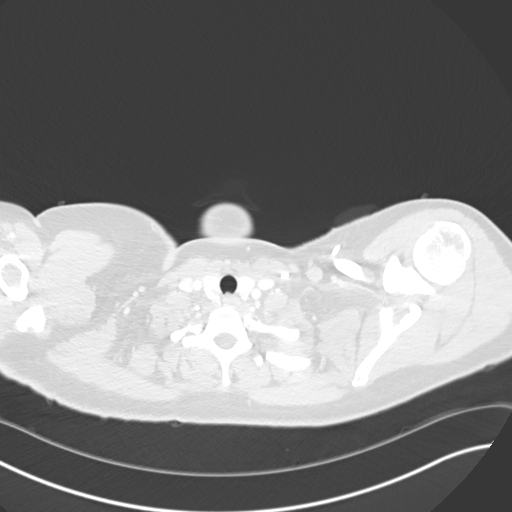

[Series 6: coronal · coronal · 0.47mm/px · 3 of 130 slices shown]
[im 26/130  lung]
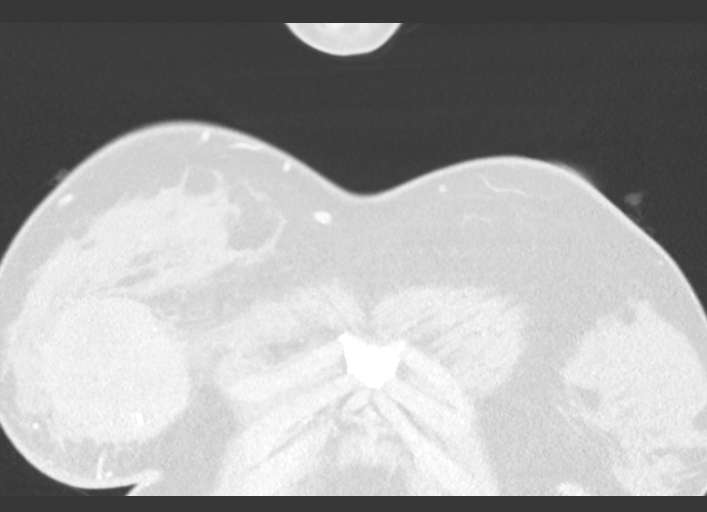
[im 52/130  lung]
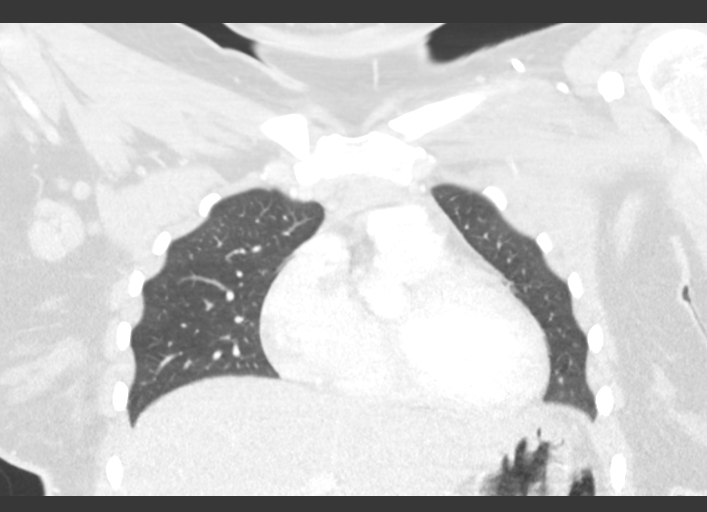
[im 78/130  lung]
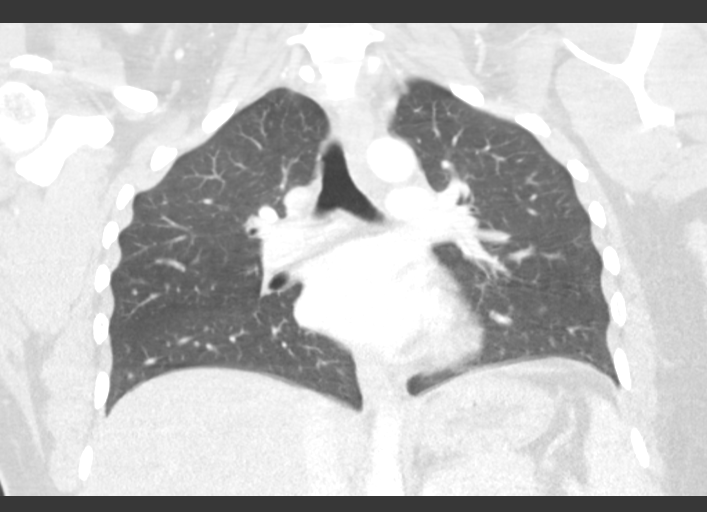

[15 of 36 positions shown; findings below may reference images not displayed]

FINDINGS: Cardiovascular: Left IJ Port-A-Cath terminates in the right atrium.
Vascular structures are otherwise unremarkable. Heart is mildly
enlarged and left ventricle appears dilated. No pericardial
effusion.

Mediastinum/Nodes: Partially imaged submental lymph nodes measure up
to 10 mm ([DATE]). Prevascular lymph nodes measure up to 1.4 cm. No
additional pathologically enlarged mediastinal or hilar lymph nodes.
Enlarged right axillary lymph nodes measure up to 1.5 cm. Right
subpectoral lymph nodes measure up to 11 mm ([DATE]). Left axillary
lymph nodes are not enlarged by CT size criteria. Esophagus is
unremarkable.

Lungs/Pleura: 3 mm subpleural medial right middle lobe nodule
(5/91), nonspecific and likely benign. Lungs are otherwise clear. No
pleural fluid. Airway is unremarkable.

Upper Abdomen: Liver may be slightly decreased in attenuation
diffusely. Visualized portions of the liver, adrenal glands,
kidneys, spleen, pancreas, stomach and bowel are otherwise grossly
unremarkable.

Musculoskeletal: Hyperattenuating mass in the right breast measures
5.6 x 7.0 cm. No worrisome lytic or sclerotic lesions.
IMPRESSION: 1. Large right breast mass with metastatic right axillary, right
subpectoral and prevascular adenopathy.
2. Borderline enlarged submental lymph nodes, incompletely imaged.
3. Left ventricle may be somewhat dilated.
4. Liver may be steatotic.

## 2019-05-25 MED ORDER — IOHEXOL 300 MG/ML  SOLN
75.0000 mL | Freq: Once | INTRAMUSCULAR | Status: AC | PRN
  Administered 2019-05-25: 75 mL via INTRAVENOUS

## 2019-05-25 MED ORDER — TECHNETIUM TC 99M MEDRONATE IV KIT
19.6000 | PACK | Freq: Once | INTRAVENOUS | Status: AC
  Administered 2019-05-25: 19.6 via INTRAVENOUS

## 2019-05-25 MED ORDER — SODIUM CHLORIDE (PF) 0.9 % IJ SOLN
INTRAMUSCULAR | Status: AC
  Filled 2019-05-25: qty 50

## 2019-05-25 NOTE — Progress Notes (Signed)
Met with patient/interpreter to introduce myself as Arboriculturist and to offer available resources.  Discussed one-time $1000 Radio broadcast assistant to assist with personal expenses while going through treatment. Advised patient to provide most recent paystub to next visit.    Patient does not qualify for Medicaid due to not being a [place] citizen.  Advised she would automatically receive a 56%discount for being uninsured and provided her with the [facility] FAA and list of supporting documents to go with application if interested in applying for any more than the 56% discount. She may bring to next appointment and I will send over to Customer Service for her. They will contact the patient directly via mail with a determination.   Gave her my card for any additional financial questions or concerns.

## 2019-05-25 NOTE — Progress Notes (Signed)
[facility]  Telephone:(336) [phone] Fax:(336) [phone]     ID: Brianna Owens DOB: 1991/04/23  MR#: [id number]  GYI#:[id number]  Patient Care Team: Patient, [name] as PCP - General (General Practice) Tarita Deshmukh, [name], MD as Consulting Physician (Oncology) Erroll Luna, MD as Consulting Physician (General Surgery) [name], RN as Oncology Nurse Navigator [name], RN as Oncology Nurse Navigator Chauncey Cruel, MD OTHER MD:  CHIEF COMPLAINT: Triple negative breast cancer, BRCA1+  CURRENT TREATMENT: neoadjuvant chemotherapy   INTERVAL HISTORY: Joniece returns today for follow up and treatment of her triple negative breast cancer accompanied by her translator.  She is scheduled to begin neoadjuvant chemotherapy consisting of doxorubicin and cyclophosphamide in dose dense fashion x4 today. This will be followed by paclitaxel and carboplatin weekly x12.  Since consultation, her genetic testing results returned and showed she is in fact BRCA 1 positive.  She also underwent breast MRI on 05/18/2019, which revealed: breast composition D; 7.1 cm right breast mass consistent with patient's biopsy-proven malignancy, no other suspicious findings within the right breast; at least 7 morphologically abnormal level I right axillary lymph nodes, 3 suspicious level III lymph nodes partially visualized, 2 suspicious 9-10 mm right internal mammary chain lymph nodes; no evidence of malignancy on the left.  She also underwent port placement on 05/24/2019.  Finally, she underwent echocardiogram yesterday, 05/25/2019, which showed an ejection fractions of 55-60%.   REVIEW OF SYSTEMS: Evadne is still hurting from her port.  She went to the [facility] but the only drug they gave her was the dexamethasone.  She does not have any of the other medications.  She has now been provided with an accountant at the [facility] so that she can get  the rest of the medicines today.  She denies unusual headaches visual changes cough phlegm production pleurisy shortness of breath change in bowel or bladder habits fever rash or bleeding.  Detailed review of systems was otherwise stable.   HISTORY OF CURRENT ILLNESS: From the original intake note:  Sublette [facility] with a 3 month history of a right breast lump that became painful in early 04/2019. Physical exam performed at that time showed a palpable, tender 13 cm lump within the right center breast under the nipple area. She underwent right breast ultrasonography at [facility] on 04/27/2019 showing: 6.1 cm palpable mass centered in the 5 o'clock position of the right breast; single right inferomedial axillary lymph node with focal cortical thickening inferiorly.  Accordingly on 05/04/2019 she proceeded to biopsy of the right breast area in question. The pathology from this procedure ([id number]) showed: invasive ductal carcinoma, grade 2-3. Prognostic indicators significant for: estrogen receptor, 0% negative and progesterone receptor, 0% negative. Proliferation marker Ki67 at 40%.  I do not find HER-2 receptor documentation  The right axillary lymph node biopsied at that time showed reactive germinal centers.  She underwent bilateral diagnostic mammography with tomography at [facility] on 05/05/2019 showing: breast density category D; biopsy-proven right breast malignancy measures 6.1 cm; no additional masses or calcifications seen in the right breast; no evidence of malignancy in the left breast.  The patient's subsequent history is as detailed below.   PAST MEDICAL HISTORY: No past medical history on file.  PAST SURGICAL HISTORY: Past Surgical History:  Procedure Laterality Date   IR IMAGING GUIDED PORT INSERTION  05/24/2019    FAMILY HISTORY: Family History  Problem Relation Age of Onset   Breast  cancer Mother    Breast cancer Maternal Aunt    Patient's father is 56 and her mother 52 as of November 2020.  The patient's mother was diagnosed with breast cancer in her early 71s.  She lives in [place]. The patient's mother's sister was also diagnosed with breast cancer in her early 73s.  The patient herself has 1 sister, no brothers.  She is not aware of any ovarian cancer cases in the family.   GYNECOLOGIC HISTORY:  Patient's last menstrual period was 05/24/2019. Menarche: 28 years old Age at first live birth: 28 years old [place] P 2 LMPregular Contraceptive HRT n/a  Hysterectomy? no BSO? no   SOCIAL HISTORY: (updated 04/2019)  Chane is currently working at [employer].  She is originally from [place].  She is divorced.  Her children are [name], 27 years old, living in [place] with his father, and [name], 15 years old, who lives with the patient.  Also at home are her uncle [name], his wife, and that wife's cousin.     ADVANCED DIRECTIVES: Not in place   HEALTH MAINTENANCE: Social History   Tobacco Use   Smoking status: Never Smoker   Smokeless tobacco: Never Used  Substance Use Topics   Alcohol use: Not Currently   Drug use: Not Currently     Colonoscopy: n/a  PAP: 04/26/2019, negative  Bone density: n/a   No Known Allergies  Current Outpatient Medications  Medication Sig Dispense Refill   dexamethasone (DECADRON) 4 MG tablet Take 2 tablets by mouth once a day on the day after chemotherapy and then take 2 tablets two times a day for 2 days. Take with food. 30 tablet 1   lidocaine-prilocaine (EMLA) cream Apply to affected area once 30 g 3   loratadine (CLARITIN) 10 MG tablet Take 1 tablet (10 mg total) by mouth daily. 60 tablet 0   prochlorperazine (COMPAZINE) 10 MG tablet Take 1 tablet (10 mg total) by mouth every 6 (six) hours as needed (Nausea or vomiting). 30 tablet 1   No current facility-administered medications for this visit.    OBJECTIVE:  [name] Spanish speaker who appears stated age  49:   05/26/19 0910  BP: 122/77  Pulse: 72  Resp: 18  Temp: 98.2 F (36.8 C)  SpO2: 100%     Body mass index is 25.71 kg/m.   Wt Readings from Last 3 Encounters:  05/26/19 154 lb 8 oz (70.1 kg)  05/24/19 157 lb 13.6 oz (71.6 kg)  05/09/19 157 lb 12.8 oz (71.6 kg)      ECOG FS:1 - Symptomatic but completely ambulatory  Sclerae unicteric, EOMs intact Wearing a mask No cervical or supraclavicular adenopathy Lungs no rales or rhonchi Heart regular rate and rhythm Abd soft, nontender, positive bowel sounds MSK no focal spinal tenderness, no upper extremity lymphedema Neuro: nonfocal, well oriented, appropriate affect Breasts: There is a very large mass in the right breast measuring in excess of 5 cm, but not associated with skin changes or nipple retraction.  I do not palpate an axillary mass.  Left breast is benign.  LAB RESULTS:  CMP     Component Value Date/Time   NA 141 05/26/2019 0811   K 4.1 05/26/2019 0811   CL 107 05/26/2019 0811   CO2 25 05/26/2019 0811   GLUCOSE 95 05/26/2019 0811   BUN 11 05/26/2019 0811   CREATININE 0.64 05/26/2019 0811   CALCIUM 9.0 05/26/2019 0811  PROT 8.4 (H) 05/26/2019 0811   ALBUMIN 4.2 05/26/2019 0811   AST 37 05/26/2019 0811   ALT 59 (H) 05/26/2019 0811   ALKPHOS 98 05/26/2019 0811   BILITOT 0.4 05/26/2019 0811   GFRNONAA >60 05/26/2019 0811   GFRAA >60 05/26/2019 0811    No results found for: TOTALPROTELP, ALBUMINELP, A1GS, A2GS, BETS, BETA2SER, GAMS, MSPIKE, SPEI  No results found for: Nils Pyle, [facility]  Lab Results  Component Value Date   WBC 6.3 05/26/2019   NEUTROABS 3.6 05/26/2019   HGB 12.4 05/26/2019   HCT 36.5 05/26/2019   MCV 89.5 05/26/2019   PLT 304 05/26/2019    No results found for: LABCA2  No components found for: [id number]  Recent Labs  Lab 05/24/19 1030  INR 1.0    No results found for: LABCA2  No results found for:  JWJ191  No results found for: YNW295  No results found for: AOZ308  No results found for: CA2729  No components found for: HGQUANT  No results found for: CEA1 / No results found for: CEA1   No results found for: AFPTUMOR  No results found for: CHROMOGRNA  No results found for: PSA1  Appointment on 05/26/2019  Component Date Value Ref Range Status   Sodium 05/26/2019 141  135 - 145 mmol/L Final   Potassium 05/26/2019 4.1  3.5 - 5.1 mmol/L Final   Chloride 05/26/2019 107  98 - 111 mmol/L Final   CO2 05/26/2019 25  22 - 32 mmol/L Final   Glucose, Bld 05/26/2019 95  70 - 99 mg/dL Final   BUN 05/26/2019 11  6 - 20 mg/dL Final   Creatinine, Ser 05/26/2019 0.64  0.44 - 1.00 mg/dL Final   Calcium 05/26/2019 9.0  8.9 - 10.3 mg/dL Final   Total Protein 05/26/2019 8.4* 6.5 - 8.1 g/dL Final   Albumin 05/26/2019 4.2  3.5 - 5.0 g/dL Final   AST 05/26/2019 37  15 - 41 U/L Final   ALT 05/26/2019 59* 0 - 44 U/L Final   Alkaline Phosphatase 05/26/2019 98  38 - 126 U/L Final   Total Bilirubin 05/26/2019 0.4  0.3 - 1.2 mg/dL Final   GFR calc non Af Amer 05/26/2019 >60  >60 mL/min Final   GFR calc Af Amer 05/26/2019 >60  >60 mL/min Final   Anion gap 05/26/2019 9  5 - 15 Final   Performed at [facility], [place] [address] [place], [place] [address]   WBC 05/26/2019 6.3  4.0 - 10.5 K/uL Final   RBC 05/26/2019 4.08  3.87 - 5.11 MIL/uL Final   Hemoglobin 05/26/2019 12.4  12.0 - 15.0 g/dL Final   HCT 05/26/2019 36.5  36.0 - 46.0 % Final   MCV 05/26/2019 89.5  80.0 - 100.0 fL Final   MCH 05/26/2019 30.4  26.0 - 34.0 pg Final   MCHC 05/26/2019 34.0  30.0 - 36.0 g/dL Final   RDW 05/26/2019 11.7  11.5 - 15.5 % Final   Platelets 05/26/2019 304  150 - 400 K/uL Final   nRBC 05/26/2019 0.0  0.0 - 0.2 % Final   Neutrophils Relative % 05/26/2019 57  % Final   Neutro Abs 05/26/2019 3.6  1.7 - 7.7 K/uL Final   Lymphocytes Relative 05/26/2019 33  %  Final   Lymphs Abs 05/26/2019 2.1  0.7 - 4.0 K/uL Final   Monocytes Relative 05/26/2019 7  % Final   Monocytes Absolute 05/26/2019 0.5  0.1 - 1.0 K/uL Final  Eosinophils Relative 05/26/2019 3  % Final   Eosinophils Absolute 05/26/2019 0.2  0.0 - 0.5 K/uL Final   Basophils Relative 05/26/2019 0  % Final   Basophils Absolute 05/26/2019 0.0  0.0 - 0.1 K/uL Final   Immature Granulocytes 05/26/2019 0  % Final   Abs Immature Granulocytes 05/26/2019 0.02  0.00 - 0.07 K/uL Final   Performed at [facility], [place] [address] [place], [place] [address]  Hospital Outpatient Visit on 05/24/2019  Component Date Value Ref Range Status   Preg Test, Ur 05/24/2019 NEGATIVE  NEGATIVE Final   Performed at [facility], [place] [address] [place], [place] [address]   WBC 05/24/2019 5.4  4.0 - 10.5 K/uL Final   RBC 05/24/2019 4.04  3.87 - 5.11 MIL/uL Final   Hemoglobin 05/24/2019 12.4  12.0 - 15.0 g/dL Final   HCT 05/24/2019 36.8  36.0 - 46.0 % Final   MCV 05/24/2019 91.1  80.0 - 100.0 fL Final   MCH 05/24/2019 30.7  26.0 - 34.0 pg Final   MCHC 05/24/2019 33.7  30.0 - 36.0 g/dL Final   RDW 05/24/2019 11.7  11.5 - 15.5 % Final   Platelets 05/24/2019 290  150 - 400 K/uL Final   nRBC 05/24/2019 0.0  0.0 - 0.2 % Final   Performed at [facility], [place] [address] [place], [place] [address]   Prothrombin Time 05/24/2019 12.7  11.4 - 15.2 seconds Final   INR 05/24/2019 1.0  0.8 - 1.2 Final   Comment: (NOTE) INR goal varies based on device and disease states. Performed at [facility], [place] [address] [place],  [address]     (this displays the last labs from the last 3 days)  No results found for: TOTALPROTELP, ALBUMINELP, A1GS, A2GS, BETS, BETA2SER, GAMS, MSPIKE, SPEI (this displays SPEP labs)  No results found for: KPAFRELGTCHN, LAMBDASER, KAPLAMBRATIO (kappa/lambda light chains)  No results found for: HGBA, HGBA2QUANT, HGBFQUANT,  HGBSQUAN (Hemoglobinopathy evaluation)   No results found for: LDH  No results found for: IRON, TIBC, IRONPCTSAT (Iron and TIBC)  No results found for: FERRITIN  Urinalysis No results found for: COLORURINE, APPEARANCEUR, LABSPEC, PHURINE, GLUCOSEU, HGBUR, BILIRUBINUR, KETONESUR, PROTEINUR, UROBILINOGEN, NITRITE, LEUKOCYTESUR   STUDIES: CT Chest W Contrast  Result Date: 05/25/2019 CLINICAL DATA:  Breast cancer, staging. EXAM: CT CHEST WITH CONTRAST TECHNIQUE: Multidetector CT imaging of the chest was performed during intravenous contrast administration. CONTRAST:  52m OMNIPAQUE IOHEXOL 300 MG/ML  SOLN COMPARISON:  None. FINDINGS: Cardiovascular: Left IJ Port-A-Cath terminates in the right atrium. Vascular structures are otherwise unremarkable. Heart is mildly enlarged and left ventricle appears dilated. No pericardial effusion. Mediastinum/Nodes: Partially imaged submental lymph nodes measure up to 10 mm (2/4). Prevascular lymph nodes measure up to 1.4 cm. No additional pathologically enlarged mediastinal or hilar lymph nodes. Enlarged right axillary lymph nodes measure up to 1.5 cm. Right subpectoral lymph nodes measure up to 11 mm (2/31). Left axillary lymph nodes are not enlarged by CT size criteria. Esophagus is unremarkable. Lungs/Pleura: 3 mm subpleural medial right middle lobe nodule (5/91), nonspecific and likely benign. Lungs are otherwise clear. No pleural fluid. Airway is unremarkable. Upper Abdomen: Liver may be slightly decreased in attenuation diffusely. Visualized portions of the liver, adrenal glands, kidneys, spleen, pancreas, stomach and bowel are otherwise grossly unremarkable. Musculoskeletal: Hyperattenuating mass in the right breast measures 5.6 x 7.0 cm. No worrisome lytic or sclerotic lesions. IMPRESSION: 1. Large right breast mass with metastatic  right axillary, right subpectoral and prevascular adenopathy. 2. Borderline enlarged submental lymph nodes, incompletely imaged.  3. Left ventricle may be somewhat dilated. 4. Liver may be steatotic. Electronically Signed   By: Lorin Picket M.D.   On: 05/25/2019 10:29   NM Bone Scan Whole Body  Result Date: 05/25/2019 CLINICAL DATA:  EOV 19.6 MCI TC99 MDP LAC '@0905' /CS DX: R BREST CA, METS SUSPECTED PT STATES NO PAIN, NO BONE SX, NO FALLS, NO FX^19.58mllicurie TC-MDP TECHNETIUM TC 81M MEDRONATE IV KITBreast cancer, mets suspected, staging EXAM: NUCLEAR MEDICINE WHOLE BODY BONE SCAN TECHNIQUE: Whole body anterior and posterior images were obtained approximately 3 hours after intravenous injection of radiopharmaceutical. RADIOPHARMACEUTICALS:  19.6 mCi Technetium-929mDP IV COMPARISON:  CT 05/25/2019 FINDINGS: No abnormal radiotracer accumulation within the axillary or appendicular skeleton to suggest metastatic disease. Sacrum partially obscured by bladder activity. IMPRESSION: No scintigraphic evidence skeletal metastasis. Electronically Signed   By: StSuzy Bouchard.D.   On: 05/25/2019 16:44   MR BREAST BILATERAL W WO CONTRAST INC CAD  Result Date: 05/18/2019 CLINICAL DATA:  [age]ear old female with newly diagnosed invasive ductal carcinoma of the right breast. An abnormal right axillary lymph node was also biopsied. This was negative for metastatic disease, but consider discordant. Strong family history of breast cancer in mother and aunt diagnosed in their [id number]:  None performed today on site. EXAM: BILATERAL BREAST MRI WITH AND WITHOUT CONTRAST TECHNIQUE: Multiplanar, multisequence MR images of both breasts were obtained prior to and following the intravenous administration of 7 ml of Gadavist. Three-dimensional MR images were rendered by post-processing of the original MR data on an independent workstation. The three-dimensional MR images were interpreted, and findings are reported in the following complete MRI report for this study. Three dimensional images were evaluated at the independent DynaCad workstation COMPARISON:   Previous exam(s). FINDINGS: Breast composition: d. Extreme fibroglandular tissue. Background parenchymal enhancement: Moderate. Right breast: There is an oval, circumscribed rapidly enhancing mass in the central right breast consistent with the patient's biopsy-proven malignancy. It measures 7.1 x 5.6 x 7.0 cm (transverse by AP by craniocaudal dimensions). No additional suspicious enhancing masses or non mass enhancement is identified within the right breast. There is no involvement of the overlying skin or underlying pectoralis muscle. Left breast: No suspicious mass or abnormal enhancement. Lymph nodes: At least 7 morphologically abnormal, suspicious level 1 right axillary lymph nodes are noted (series 5, images 25-62 of 144). Additionally, at least 3 prominent level 3 lymph nodes are partially visualized along the upper limits of the study (series 5 images 25-29 of 144). These are asymmetric from the contralateral side and suspicious for involvement of disease. A suspicious 7 x 10 mm right internal mammary chain lymph node is identified centrally (series 5, image 86 of 144). A higher 9 mm internal mammary chain node is also asymmetric and suspicious (series 5, image 43/144). Ancillary findings:  None. IMPRESSION: 1. 7.1 cm right breast mass consistent with the patient's biopsy-proven malignancy. No other suspicious findings within the right breast. 2. At least seven morphologically abnormal level I right axillary lymph nodes. Three suspicious level III lymph nodes partially visualized along the superior limits of the study. 3. Two suspicious 9-10 mm right internal mammary chain lymph nodes. 4. No MRI evidence of malignancy on the left. RECOMMENDATION: Per clinical treatment plan. BI-RADS CATEGORY  6: Known biopsy-proven malignancy. Electronically Signed   By: SeKristopher Oppenheim.D.   On: 05/18/2019 14:30   US[place]REAST LTD UNI RIGHT INC  AXILLA  Result Date: 04/27/2019 CLINICAL DATA:  Rapidly enlarging mass felt by  the patient in the right breast since September 2020. She reports that the mass was approximately the size of a golf ball when she 1st noticed it. Family history of breast cancer in the patient's mother and maternal aunt. She is not sure but thinks that her mother was diagnosed with breast cancer in her late 35's. EXAM: ULTRASOUND OF THE RIGHT BREAST COMPARISON:  Previous exam(s). FINDINGS: On physical exam, there is an approximately 7 cm oval, firm, somewhat fixed palpable mass in the inferior retroareolar and periareolar right breast. There are no palpable right axillary lymph nodes. Targeted ultrasound is performed, showing a 6.1 x 6.0 x 3.8 cm oval, horizontally oriented, mildly heterogeneous, hypoechoic mass centered in the 5 o'clock position of the right breast, 3 cm from the nipple. This has some circumscribed and some indistinct and mildly irregular margins. Ultrasound of the right axilla demonstrated multiple normal appearing lymph nodes as well as a lymph node with focal cortical thickening inferiorly, in the inferomedial right axilla. The focally thickened portion measures 4.7 mm in maximum thickness. IMPRESSION: 1. 6.1 cm palpable mass centered in the 5 o'clock position of the right breast with imaging features suspicious for malignancy. 2. Single right inferomedial axillary lymph node with focal cortical thickening inferiorly, suspicious for a metastatic node. RECOMMENDATION: Ultrasound-guided core needle biopsy of the 6.1 cm mass in the 5 o'clock position of the right breast and ultrasound-guided core needle biopsy of the abnormal appearing right axillary lymph node. This has been discussed with the patient and the biopsies have been scheduled at 1:45 p.m. on 05/04/2019. I have discussed the findings and recommendations with the patient. If applicable, a reminder letter will be sent to the patient regarding the next appointment. BI-RADS CATEGORY  4: Suspicious. Electronically Signed   By: Claudie Revering  M.D.   On: 04/27/2019 12:41   ECHOCARDIOGRAM COMPLETE  Result Date: 05/25/2019   ECHOCARDIOGRAM REPORT   Patient Name:   LAYLAMARIE MEUSER [facility] Date of Exam: 05/25/2019 Medical Rec #:  [id number]                   Height:       65.0 in Accession #:    [id number]                  Weight:       157.8 lb Date of Birth:  December 09, 1990                   BSA:          1.79 m Patient Age:    28 years                    BP:           115/70 mmHg Patient Gender: F                           HR:           68 bpm. Exam Location:  Outpatient Procedure: 2D Echo, Cardiac Doppler and Color Doppler Indications:    Chemo evaluation  History:        Patient has no prior history of Echocardiogram examinations.                 Breast cancer, chemo.  Sonographer:    Dustin Flock Referring Phys: [place]  Yasamin Karel IMPRESSIONS  1. Left ventricular ejection fraction, by visual estimation, is 55 to 60%. The left ventricle has normal function. There is no left ventricular hypertrophy.  2. The left ventricle has no regional wall motion abnormalities.  3. GLS underestimated due to poor endocardial tracking.  4. Global right ventricle has normal systolic function.The right ventricular size is normal. No increase in right ventricular wall thickness.  5. Left atrial size was normal.  6. Right atrial size was normal.  7. Trivial pericardial effusion is present.  8. The mitral valve is normal in structure. No evidence of mitral valve regurgitation.  9. The tricuspid valve is normal in structure. Tricuspid valve regurgitation is trivial. 10. The aortic valve is normal in structure. Aortic valve regurgitation is not visualized. 11. The pulmonic valve was normal in structure. Pulmonic valve regurgitation is not visualized. 12. The average left ventricular global longitudinal strain is -13.8 %. FINDINGS  Left Ventricle: Left ventricular ejection fraction, by visual estimation, is 55 to 60%. The left ventricle has normal function. The  average left ventricular global longitudinal strain is -13.8 %. The left ventricle has no regional wall motion abnormalities. There is no left ventricular hypertrophy. Left ventricular diastolic parameters were normal. GLS underestimated due to poor endocardial tracking. Right Ventricle: The right ventricular size is normal. No increase in right ventricular wall thickness. Global RV systolic function is has normal systolic function. Left Atrium: Left atrial size was normal in size. Right Atrium: Right atrial size was normal in size Pericardium: Trivial pericardial effusion is present. Mitral Valve: The mitral valve is normal in structure. No evidence of mitral valve regurgitation. Tricuspid Valve: The tricuspid valve is normal in structure. Tricuspid valve regurgitation is trivial. Aortic Valve: The aortic valve is normal in structure. Aortic valve regurgitation is not visualized. Pulmonic Valve: The pulmonic valve was normal in structure. Pulmonic valve regurgitation is not visualized. Pulmonic regurgitation is not visualized. Aorta: The aortic root and ascending aorta are structurally normal, with no evidence of dilitation. IAS/Shunts: No atrial level shunt detected by color flow Doppler.  LEFT VENTRICLE PLAX 2D LVIDd:         4.50 cm  Diastology LVIDs:         3.20 cm  LV e' lateral:   10.30 cm/s LV PW:         0.90 cm  LV E/e' lateral: 6.9 LV IVS:        0.90 cm  LV e' medial:    8.38 cm/s LVOT diam:     2.10 cm  LV E/e' medial:  8.4 LV SV:         51 ml LV SV Index:   28.19    2D Longitudinal Strain LVOT Area:     3.46 cm 2D Strain GLS Avg:     -13.8 %  RIGHT VENTRICLE RV Basal diam:  2.20 cm RV S prime:     12.00 cm/s TAPSE (M-mode): 2.5 cm LEFT ATRIUM             Index       RIGHT ATRIUM          Index LA diam:        2.90 cm 1.62 cm/m  RA Area:     9.97 cm LA Vol (A2C):   39.9 ml 22.30 ml/m RA Volume:   20.40 ml 11.40 ml/m LA Vol (A4C):   45.2 ml 25.27 ml/m LA Biplane Vol: 44.2 ml 24.71 ml/m  AORTIC  VALVE LVOT Vmax:  95.30 cm/s LVOT Vmean:  52.400 cm/s LVOT VTI:    0.139 m  AORTA Ao Root diam: 2.70 cm MITRAL VALVE MV Area (PHT): 6.83 cm            SHUNTS MV PHT:        32.19 msec          Systemic VTI:  0.14 m MV Decel Time: 111 msec            Systemic Diam: 2.10 cm MV E velocity: 70.70 cm/s 103 cm/s  Glori Bickers MD Electronically signed by Glori Bickers MD Signature Date/Time: 05/25/2019/1:56:36 PM    Final    [place] AXILLARY NODE CORE BIOPSY RIGHT  Addendum Date: 05/05/2019   ADDENDUM REPORT: 05/05/2019 14:58 ADDENDUM: Pathology revealed GRADE II/III INVASIVE DUCTAL CARCINOMA of the Right breast, 5 o'clock. This was found to be concordant by Dr. Lovey Newcomer. Pathology revealed LYMPH NODE WITH REACTIVE GERMINAL CENTERS of the Right axilla. This was found to be discordant by Dr. Lovey Newcomer, with targeted node excision recommended. Pathology results were discussed with the patient by telephone by Lattie Corns, Bilingual Patient Services Representative. The patient reported doing well after the biopsy with tenderness at the site. Post biopsy instructions and care were reviewed and questions were answered. The patient was encouraged to call [facility] for any additional concerns. Surgical consultation has been arranged with Dr. Erroll Luna at [facility] on May 06, 2019. The patient returned to [facility] on May 05, 2019 for a bilateral diagnostic mammogram. This procedure is dictated in a separate report. Recommendation for a bilateral breast MRI given her age, breast density and strong family history. Pathology results reported by Terie Purser, RN on 05/05/2019. Electronically Signed   By: Lovey Newcomer M.D.   On: 05/05/2019 14:58   Result Date: 05/05/2019 CLINICAL DATA:  Patient with cortically thickened right axillary lymph node. EXAM: [place] AXILLARY NODE CORE BIOPSY RIGHT COMPARISON:  Previous exam(s). FINDINGS: I met with the patient and  we discussed the procedure of ultrasound-guided biopsy, including benefits and alternatives. We discussed the high likelihood of a successful procedure. We discussed the risks of the procedure, including infection, bleeding, tissue injury, clip migration, and inadequate sampling. Informed written consent was given. The usual time-out protocol was performed immediately prior to the procedure. Using sterile technique and 1% Lidocaine as local anesthetic, under direct ultrasound visualization, a 14 gauge spring-loaded device was used to perform biopsy of right axillary lymph using a lateral approach. At the conclusion of the procedure [facility] tissue marker clip was deployed into the biopsy cavity. Follow up 2 view mammogram was performed and dictated separately. IMPRESSION: Ultrasound guided biopsy of right axillary lymph node. No apparent complications. Electronically Signed: By: Lovey Newcomer M.D. 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 An abnormal lymph node in the right axilla was biopsied and was negative for metastatic disease, however considered discordant given its abnormal appearance. The patient returns today for bilateral diagnostic mammography as this was not initially performed due to her young age. EXAM: DIGITAL DIAGNOSTIC BILATERAL MAMMOGRAM WITH CAD AND TOMO COMPARISON:  Prior right breast ultrasounds dated 04/27/2019 and 05/04/2019. ACR Breast Density Category d: The breast tissue is extremely dense, which lowers the  sensitivity of mammography. FINDINGS: Large round mass within the central to slightly inner right breast containing a Q shaped biopsy marking clip compatible with biopsy proven malignancy measures 6.1 cm. No definite additional masses identified.  No calcifications seen in the right breast. An abnormal/enlarged lymph node containing a HydroMARK clip is partially visualized within the right axilla. No suspicious masses or calcifications are identified in the left breast. Mammographic images were processed with CAD. IMPRESSION: 1. Biopsy proven malignancy in the right breast measures 6.1 cm mammographically with the previously biopsied morphologically abnormal lymph node partially visualized in the right axilla. No additional masses or calcifications seen in the right breast. 2.  No mammographic evidence of malignancy in the left breast. RECOMMENDATION: 1.  Treatment plan for known right breast malignancy. 2. Given extremely dense fibroglandular tissue, consider contrast enhanced breast MRI. The findings and recommendations were discussed with the patient via [place] interpreter, Santa Lighter. The patient is aware of her biopsy results (biopsy performed yesterday) and was informed of her appointment at [facility] scheduled for tomorrow. I have discussed the findings and recommendations with the patient. If applicable, a reminder letter will be sent to the patient regarding the next appointment. BI-RADS CATEGORY  6: Known biopsy-proven malignancy. Electronically Signed   By: Everlean Alstrom M.D.   On: 05/05/2019 14:09   [place] RT BREAST BX W LOC DEV 1ST LESION IMG BX SPEC US GUIDE  Result Date: 05/04/2019 CLINICAL DATA:  Patient with large palpable right breast mass. EXAM: ULTRASOUND GUIDED RIGHT BREAST CORE NEEDLE BIOPSY COMPARISON:  Previous exam(s). FINDINGS: I met with the patient and we discussed the procedure of ultrasound-guided biopsy, including benefits and alternatives. We discussed the high likelihood of a successful procedure. We discussed the risks of the procedure, including infection, bleeding, tissue injury, clip migration, and inadequate sampling. Informed written consent was given. The usual time-out protocol was performed  immediately prior to the procedure. Lesion quadrant: Lower inner quadrant Using sterile technique and 1% Lidocaine as local anesthetic, under direct ultrasound visualization, a 14 gauge spring-loaded device was used to perform biopsy of right breast mass 5 o'clock position using a lateral approach. At the conclusion of the procedure Q shaped tissue marker clip was deployed into the biopsy cavity. IMPRESSION: Ultrasound guided biopsy of right breast mass. No apparent complications. Electronically Signed   By: Lovey Newcomer M.D.   On: 05/04/2019 14:20   IR IMAGING GUIDED PORT INSERTION  Result Date: 05/24/2019 INDICATION: History of right-sided breast cancer. In need of durable intravenous access for chemotherapy administration EXAM: IMPLANTED PORT A CATH PLACEMENT WITH ULTRASOUND AND FLUOROSCOPIC GUIDANCE COMPARISON:  None. MEDICATIONS: Ancef 2 gm IV; The antibiotic was administered within an appropriate time interval prior to skin puncture. ANESTHESIA/SEDATION: Moderate (conscious) sedation was employed during this procedure. A total of Versed 1.5 mg and Fentanyl 75 mcg was administered intravenously. Moderate Sedation Time: 19 minutes. The patient's level of consciousness and vital signs were monitored continuously by radiology nursing throughout the procedure under my direct supervision. CONTRAST:  None FLUOROSCOPY TIME:  42 seconds (8 mGy) COMPLICATIONS: None immediate. PROCEDURE: The procedure, risks, benefits, and alternatives were explained to the patient via the use of a medical translator. Questions regarding the procedure were encouraged and answered. The patient understands and consents to the procedure. Given the presence of the right-sided breast cancer decision was made to place a left internal jugular approach port a catheter. As such, the left neck and chest were prepped with chlorhexidine in a  sterile fashion, and a sterile drape was applied covering the operative field. Maximum barrier sterile  technique with sterile gowns and gloves were used for the procedure. A timeout was performed prior to the initiation of the procedure. Local anesthesia was provided with 1% lidocaine with epinephrine. After creating a small venotomy incision, a micropuncture kit was utilized to access the internal jugular vein. Real-time ultrasound guidance was utilized for vascular access including the acquisition of a permanent ultrasound image documenting patency of the accessed vessel. The microwire was utilized to measure appropriate catheter length. A subcutaneous port pocket was then created along the upper chest wall utilizing a combination of sharp and blunt dissection. The pocket was irrigated with sterile saline. A single lumen ISP power injectable port was chosen for placement. The 8 Fr catheter was tunneled from the port pocket site to the venotomy incision. The port was placed in the pocket. The external catheter was trimmed to appropriate length. At the venotomy, an 8 Fr peel-away sheath was placed over a guidewire under fluoroscopic guidance. The catheter was then placed through the sheath and the sheath was removed. Final catheter positioning was confirmed and documented with a fluoroscopic spot radiograph. The port was accessed with a Huber needle, aspirated and flushed with heparinized saline. The venotomy site was closed with an interrupted 4-0 Vicryl suture. The port pocket incision was closed with interrupted 2-0 Vicryl suture and the skin was opposed with a running subcuticular 4-0 Vicryl suture. Dermabond and Steri-strips were applied to both incisions. Dressings were placed. The patient tolerated the procedure well without immediate post procedural complication. FINDINGS: After catheter placement, the tip lies within the superior cavoatrial junction. The catheter aspirates and flushes normally and is ready for immediate use. IMPRESSION: Successful placement of a left internal jugular approach power  injectable Port-A-Cath. The catheter is ready for immediate use. Electronically Signed   By: Sandi Mariscal M.D.   On: 05/24/2019 13:00    ELIGIBLE FOR AVAILABLE RESEARCH PROTOCOL:no  ASSESSMENT: 28 y.o. BRCA1 positive [name] woman status post right breast overlapping sites biopsy 05/04/2019 for a clinical T3 N0, stage IIIB invasive ductal carcinoma, grade 2, triple negative, with an MIB-1 of 40%.  (a) staging CT chest and bone scan 05/25/2019 show no evidence of metastatic disease  (1) genetics testing 05/09/2019  (2) neoadjuvant chemotherapy will consist of doxorubicin and cyclophosphamide in dose dense fashion x4 followed by paclitaxel and carboplatin weekly x12  (3) definitive surgery to follow  (4) adjuvant radiation to follow   PLAN: Tabita does not have stage IV disease which is of course good news and I gave her the appropriate information.  Her labs today are also excellent.  She understands she has locally advanced breast cancer which is however curable.  The cure involves 20 weeks of chemotherapy, which she is starting today.  She has not been able to obtain all her supportive medicines but I have switched the prescriptions to the [facility] where she has an account and she will be able to obtain those today.  I gave her a routing sheet on how to take her medications.  Unfortunately this is an [place].  I did go over it in great detail today so she could understand it and hopefully will follow it.  I also asked her to keep some notes on what symptoms she has over the next several days so that when she sees me next week we can troubleshoot those.  We are going to have to  follow [place] very closely and support her particularly through the earlier portion of her chemo to make sure that she gets her treatments on time and with a minimum of complications.  I did not discuss the BRCA positivity today.  I think this will likely lead her to bilateral mastectomies  and we will be discussing that when she is a bit more comfortable with the chemotherapy portion  She will see me again in 1 week.  She knows to call for any other issue that may develop before then.   Chauncey Cruel, MD   05/26/2019 10:02 AM Medical Oncology and Hematology [facility] [place], [place] [address] Tel. [phone]    Fax. [phone]   This document serves as a record of services personally performed by Lurline Del, MD. It was created on his behalf by Wilburn Mylar, a trained medical scribe. The creation of this record is based on the scribe's personal observations and the provider's statements to them.   I, Lurline Del MD, have reviewed the above documentation for accuracy and completeness, and I agree with the above.

## 2019-05-25 NOTE — Progress Notes (Signed)
  Echocardiogram 2D Echocardiogram has been performed.  Brianna Owens 05/25/2019, 1:22 PM

## 2019-05-26 ENCOUNTER — Inpatient Hospital Stay: Payer: No Typology Code available for payment source

## 2019-05-26 ENCOUNTER — Encounter: Payer: Self-pay | Admitting: Genetic Counselor

## 2019-05-26 ENCOUNTER — Encounter: Payer: Self-pay | Admitting: *Deleted

## 2019-05-26 ENCOUNTER — Other Ambulatory Visit: Payer: Self-pay | Admitting: *Deleted

## 2019-05-26 ENCOUNTER — Other Ambulatory Visit: Payer: Self-pay

## 2019-05-26 ENCOUNTER — Encounter: Payer: Self-pay | Admitting: Oncology

## 2019-05-26 ENCOUNTER — Telehealth: Payer: Self-pay | Admitting: *Deleted

## 2019-05-26 ENCOUNTER — Encounter: Payer: Self-pay | Admitting: General Practice

## 2019-05-26 ENCOUNTER — Inpatient Hospital Stay: Payer: Self-pay | Admitting: General Practice

## 2019-05-26 ENCOUNTER — Inpatient Hospital Stay (HOSPITAL_BASED_OUTPATIENT_CLINIC_OR_DEPARTMENT_OTHER): Payer: No Typology Code available for payment source | Admitting: Oncology

## 2019-05-26 VITALS — BP 122/77 | HR 72 | Temp 98.2°F | Resp 18 | Ht 65.0 in | Wt 154.5 lb

## 2019-05-26 DIAGNOSIS — Z171 Estrogen receptor negative status [ER-]: Secondary | ICD-10-CM

## 2019-05-26 DIAGNOSIS — K760 Fatty (change of) liver, not elsewhere classified: Secondary | ICD-10-CM

## 2019-05-26 DIAGNOSIS — Z1501 Genetic susceptibility to malignant neoplasm of breast: Secondary | ICD-10-CM

## 2019-05-26 DIAGNOSIS — Z01419 Encounter for gynecological examination (general) (routine) without abnormal findings: Secondary | ICD-10-CM

## 2019-05-26 DIAGNOSIS — C50811 Malignant neoplasm of overlapping sites of right female breast: Secondary | ICD-10-CM

## 2019-05-26 DIAGNOSIS — Z1379 Encounter for other screening for genetic and chromosomal anomalies: Secondary | ICD-10-CM | POA: Insufficient documentation

## 2019-05-26 DIAGNOSIS — Z1502 Genetic susceptibility to malignant neoplasm of ovary: Secondary | ICD-10-CM

## 2019-05-26 DIAGNOSIS — Z95828 Presence of other vascular implants and grafts: Secondary | ICD-10-CM

## 2019-05-26 LAB — CBC WITH DIFFERENTIAL/PLATELET
Abs Immature Granulocytes: 0.02 10*3/uL (ref 0.00–0.07)
Basophils Absolute: 0 10*3/uL (ref 0.0–0.1)
Basophils Relative: 0 %
Eosinophils Absolute: 0.2 10*3/uL (ref 0.0–0.5)
Eosinophils Relative: 3 %
HCT: 36.5 % (ref 36.0–46.0)
Hemoglobin: 12.4 g/dL (ref 12.0–15.0)
Immature Granulocytes: 0 %
Lymphocytes Relative: 33 %
Lymphs Abs: 2.1 10*3/uL (ref 0.7–4.0)
MCH: 30.4 pg (ref 26.0–34.0)
MCHC: 34 g/dL (ref 30.0–36.0)
MCV: 89.5 fL (ref 80.0–100.0)
Monocytes Absolute: 0.5 10*3/uL (ref 0.1–1.0)
Monocytes Relative: 7 %
Neutro Abs: 3.6 10*3/uL (ref 1.7–7.7)
Neutrophils Relative %: 57 %
Platelets: 304 10*3/uL (ref 150–400)
RBC: 4.08 MIL/uL (ref 3.87–5.11)
RDW: 11.7 % (ref 11.5–15.5)
WBC: 6.3 10*3/uL (ref 4.0–10.5)
nRBC: 0 % (ref 0.0–0.2)

## 2019-05-26 LAB — COMPREHENSIVE METABOLIC PANEL
ALT: 59 U/L — ABNORMAL HIGH (ref 0–44)
AST: 37 U/L (ref 15–41)
Albumin: 4.2 g/dL (ref 3.5–5.0)
Alkaline Phosphatase: 98 U/L (ref 38–126)
Anion gap: 9 (ref 5–15)
BUN: 11 mg/dL (ref 6–20)
CO2: 25 mmol/L (ref 22–32)
Calcium: 9 mg/dL (ref 8.9–10.3)
Chloride: 107 mmol/L (ref 98–111)
Creatinine, Ser: 0.64 mg/dL (ref 0.44–1.00)
GFR calc Af Amer: >60 mL/min (ref >60)
GFR calc non Af Amer: >60 mL/min (ref >60)
Glucose, Bld: 95 mg/dL (ref 70–99)
Potassium: 4.1 mmol/L (ref 3.5–5.1)
Sodium: 141 mmol/L (ref 135–145)
Total Bilirubin: 0.4 mg/dL (ref 0.3–1.2)
Total Protein: 8.4 g/dL — ABNORMAL HIGH (ref 6.5–8.1)

## 2019-05-26 MED ORDER — PROCHLORPERAZINE MALEATE 10 MG PO TABS: 10.0000 mg | ORAL_TABLET | Freq: Four times a day (QID) | ORAL | 1 refills | Status: DC | PRN

## 2019-05-26 MED ORDER — DOXORUBICIN HCL CHEMO IV INJECTION 2 MG/ML
60.0000 mg/m2 | Freq: Once | INTRAVENOUS | Status: AC
  Administered 2019-05-26: 108 mg via INTRAVENOUS
  Filled 2019-05-26: qty 54

## 2019-05-26 MED ORDER — GOSERELIN ACETATE 3.6 MG ~~LOC~~ IMPL
DRUG_IMPLANT | SUBCUTANEOUS | Status: AC
  Filled 2019-05-26: qty 3.6

## 2019-05-26 MED ORDER — SODIUM CHLORIDE 0.9 % IV SOLN
Freq: Once | INTRAVENOUS | Status: AC
  Administered 2019-05-26: 11:00:00 via INTRAVENOUS
  Filled 2019-05-26: qty 5

## 2019-05-26 MED ORDER — HEPARIN SOD (PORK) LOCK FLUSH 100 UNIT/ML IV SOLN
500.0000 [IU] | Freq: Once | INTRAVENOUS | Status: AC | PRN
  Administered 2019-05-26: 500 [IU]
  Filled 2019-05-26: qty 5

## 2019-05-26 MED ORDER — LIDOCAINE-PRILOCAINE 2.5-2.5 % EX CREA: TOPICAL_CREAM | CUTANEOUS | 3 refills | Status: DC

## 2019-05-26 MED ORDER — LORATADINE 10 MG PO TABS: 10.0000 mg | ORAL_TABLET | Freq: Every day | ORAL | 0 refills | Status: DC

## 2019-05-26 MED ORDER — GOSERELIN ACETATE 3.6 MG ~~LOC~~ IMPL
3.6000 mg | DRUG_IMPLANT | Freq: Once | SUBCUTANEOUS | Status: AC
  Administered 2019-05-26: 11:00:00 3.6 mg via SUBCUTANEOUS

## 2019-05-26 MED ORDER — PALONOSETRON HCL INJECTION 0.25 MG/5ML
0.2500 mg | Freq: Once | INTRAVENOUS | Status: AC
  Administered 2019-05-26: 0.25 mg via INTRAVENOUS

## 2019-05-26 MED ORDER — PALONOSETRON HCL INJECTION 0.25 MG/5ML
INTRAVENOUS | Status: AC
  Filled 2019-05-26: qty 5

## 2019-05-26 MED ORDER — SODIUM CHLORIDE 0.9 % IV SOLN
600.0000 mg/m2 | Freq: Once | INTRAVENOUS | Status: AC
  Administered 2019-05-26: 12:00:00 1080 mg via INTRAVENOUS
  Filled 2019-05-26: qty 54

## 2019-05-26 MED ORDER — SODIUM CHLORIDE 0.9% FLUSH
10.0000 mL | INTRAVENOUS | Status: DC | PRN
  Administered 2019-05-26: 10 mL
  Filled 2019-05-26: qty 10

## 2019-05-26 MED ORDER — SODIUM CHLORIDE 0.9% FLUSH
10.0000 mL | INTRAVENOUS | Status: DC | PRN
  Administered 2019-05-26: 09:00:00 10 mL via INTRAVENOUS
  Filled 2019-05-26: qty 10

## 2019-05-26 MED ORDER — SODIUM CHLORIDE 0.9 % IV SOLN
Freq: Once | INTRAVENOUS | Status: AC
  Administered 2019-05-26: 10:00:00 via INTRAVENOUS
  Filled 2019-05-26: qty 250

## 2019-05-26 NOTE — Patient Instructions (Signed)

## 2019-05-26 NOTE — Patient Instructions (Signed)
[facility] Discharge Instructions for Patients Receiving Chemotherapy  Today you received the following chemotherapy agents Adriamycin and Cytoxan  To help prevent nausea and vomiting after your treatment, we encourage you to take your nausea medication as prescribed.   If you develop nausea and vomiting that is not controlled by your nausea medication, call the clinic.   BELOW ARE SYMPTOMS THAT SHOULD BE REPORTED IMMEDIATELY:  *FEVER GREATER THAN 100.5 F  *CHILLS WITH OR WITHOUT FEVER  NAUSEA AND VOMITING THAT IS NOT CONTROLLED WITH YOUR NAUSEA MEDICATION  *UNUSUAL SHORTNESS OF BREATH  *UNUSUAL BRUISING OR BLEEDING  TENDERNESS IN MOUTH AND THROAT WITH OR WITHOUT PRESENCE OF ULCERS  *URINARY PROBLEMS  *BOWEL PROBLEMS  UNUSUAL RASH Items with * indicate a potential emergency and should be followed up as soon as possible.  Feel free to call the clinic should you have any questions or concerns. The clinic phone number is [phone].  Please show the [facility] at check-in to the Emergency Department and triage nurse.  Doxorubicin injection(Adriamycin) What is this medicine? DOXORUBICIN (dox oh ROO bi sin) is a chemotherapy drug. It is used to treat many kinds of cancer like leukemia, lymphoma, neuroblastoma, sarcoma, and Wilms' tumor. It is also used to treat bladder cancer, breast cancer, lung cancer, ovarian cancer, stomach cancer, and thyroid cancer. This medicine may be used for other purposes; ask your health care provider or pharmacist if you have questions. COMMON BRAND NAME(S): Adriamycin, Adriamycin PFS, Adriamycin RDF, Rubex What should I tell my health care provider before I take this medicine? They need to know if you have any of these conditions:  heart disease  history of low blood counts caused by a medicine  liver disease  recent or ongoing radiation therapy  an unusual or allergic reaction to doxorubicin, other chemotherapy  agents, other medicines, foods, dyes, or preservatives  pregnant or trying to get pregnant  breast-feeding How should I use this medicine? This drug is given as an infusion into a vein. It is administered in a hospital or clinic by a specially trained health care professional. If you have pain, swelling, burning or any unusual feeling around the site of your injection, tell your health care professional right away. Talk to your pediatrician regarding the use of this medicine in children. Special care may be needed. Overdosage: If you think you have taken too much of this medicine contact a poison control center or emergency room at once. NOTE: This medicine is only for you. Do not share this medicine with others. What if I miss a dose? It is important not to miss your dose. Call your doctor or health care professional if you are unable to keep an appointment. What may interact with this medicine? This medicine may interact with the following medications:  6-mercaptopurine  paclitaxel  phenytoin  St. John's Wort  trastuzumab  verapamil This list may not describe all possible interactions. Give your health care provider a list of all the medicines, herbs, non-prescription drugs, or dietary supplements you use. Also tell them if you smoke, drink alcohol, or use illegal drugs. Some items may interact with your medicine. What should I watch for while using this medicine? This drug may make you feel generally unwell. This is not uncommon, as chemotherapy can affect healthy cells as well as cancer cells. Report any side effects. Continue your course of treatment even though you feel ill unless your doctor tells you to stop. There is a maximum amount  of this medicine you should receive throughout your life. The amount depends on the medical condition being treated and your overall health. Your doctor will watch how much of this medicine you receive in your lifetime. Tell your doctor if you have  taken this medicine before. You may need blood work done while you are taking this medicine. Your urine may turn red for a few days after your dose. This is not blood. If your urine is dark or brown, call your doctor. In some cases, you may be given additional medicines to help with side effects. Follow all directions for their use. Call your doctor or health care professional for advice if you get a fever, chills or sore throat, or other symptoms of a cold or flu. Do not treat yourself. This drug decreases your body's ability to fight infections. Try to avoid being around people who are sick. This medicine may increase your risk to bruise or bleed. Call your doctor or health care professional if you notice any unusual bleeding. Talk to your doctor about your risk of cancer. You may be more at risk for certain types of cancers if you take this medicine. Do not become pregnant while taking this medicine or for 6 months after stopping it. Women should inform their doctor if they wish to become pregnant or think they might be pregnant. Men should not father a child while taking this medicine and for 6 months after stopping it. There is a potential for serious side effects to an unborn child. Talk to your health care professional or pharmacist for more information. Do not breast-feed an infant while taking this medicine. This medicine has caused ovarian failure in some women and reduced sperm counts in some men This medicine may interfere with the ability to have a child. Talk with your doctor or health care professional if you are concerned about your fertility. This medicine may cause a decrease in Co-Enzyme Q-10. You should make sure that you get enough Co-Enzyme Q-10 while you are taking this medicine. Discuss the foods you eat and the vitamins you take with your health care professional. What side effects may I notice from receiving this medicine? Side effects that you should report to your doctor or  health care professional as soon as possible:  allergic reactions like skin rash, itching or hives, swelling of the face, lips, or tongue  breathing problems  chest pain  fast or irregular heartbeat  low blood counts - this medicine may decrease the number of white blood cells, red blood cells and platelets. You may be at increased risk for infections and bleeding.  pain, redness, or irritation at site where injected  signs of infection - fever or chills, cough, sore throat, pain or difficulty passing urine  signs of decreased platelets or bleeding - bruising, pinpoint red spots on the skin, black, tarry stools, blood in the urine  swelling of the ankles, feet, hands  tiredness  weakness Side effects that usually do not require medical attention (report to your doctor or health care professional if they continue or are bothersome):  diarrhea  hair loss  mouth sores  nail discoloration or damage  nausea  red colored urine  vomiting This list may not describe all possible side effects. Call your doctor for medical advice about side effects. You may report side effects to FDA at 1-800-FDA-1088. Where should I keep my medicine? This drug is given in a hospital or clinic and will not be stored at home.  NOTE: This sheet is a summary. It may not cover all possible information. If you have questions about this medicine, talk to your doctor, pharmacist, or health care provider.  2020 Elsevier/Gold Standard (2017-01-14 11:01:26)   Cyclophosphamide injection(Cytoxan) What is this medicine? CYCLOPHOSPHAMIDE (sye kloe FOSS fa mide) is a chemotherapy drug. It slows the growth of cancer cells. This medicine is used to treat many types of cancer like lymphoma, myeloma, leukemia, breast cancer, and ovarian cancer, to name a few. This medicine may be used for other purposes; ask your health care provider or pharmacist if you have questions. COMMON BRAND NAME(S): Cytoxan, Neosar What  should I tell my health care provider before I take this medicine? They need to know if you have any of these conditions:  blood disorders  history of other chemotherapy  infection  kidney disease  liver disease  recent or ongoing radiation therapy  tumors in the bone marrow  an unusual or allergic reaction to cyclophosphamide, other chemotherapy, other medicines, foods, dyes, or preservatives  pregnant or trying to get pregnant  breast-feeding How should I use this medicine? This drug is usually given as an injection into a vein or muscle or by infusion into a vein. It is administered in a hospital or clinic by a specially trained health care professional. Talk to your pediatrician regarding the use of this medicine in children. Special care may be needed. Overdosage: If you think you have taken too much of this medicine contact a poison control center or emergency room at once. NOTE: This medicine is only for you. Do not share this medicine with others. What if I miss a dose? It is important not to miss your dose. Call your doctor or health care professional if you are unable to keep an appointment. What may interact with this medicine? This medicine may interact with the following medications:  amiodarone  amphotericin B  azathioprine  certain antiviral medicines for HIV or AIDS such as protease inhibitors (e.g., indinavir, ritonavir) and zidovudine  certain blood pressure medications such as benazepril, captopril, enalapril, fosinopril, lisinopril, moexipril, monopril, perindopril, quinapril, ramipril, trandolapril  certain cancer medications such as anthracyclines (e.g., daunorubicin, doxorubicin), busulfan, cytarabine, paclitaxel, pentostatin, tamoxifen, trastuzumab  certain diuretics such as chlorothiazide, chlorthalidone, hydrochlorothiazide, indapamide, metolazone  certain medicines that treat or prevent blood clots like warfarin  certain muscle relaxants such  as succinylcholine  cyclosporine  etanercept  indomethacin  medicines to increase blood counts like filgrastim, pegfilgrastim, sargramostim  medicines used as general anesthesia  metronidazole  natalizumab This list may not describe all possible interactions. Give your health care provider a list of all the medicines, herbs, non-prescription drugs, or dietary supplements you use. Also tell them if you smoke, drink alcohol, or use illegal drugs. Some items may interact with your medicine. What should I watch for while using this medicine? Visit your doctor for checks on your progress. This drug may make you feel generally unwell. This is not uncommon, as chemotherapy can affect healthy cells as well as cancer cells. Report any side effects. Continue your course of treatment even though you feel ill unless your doctor tells you to stop. Drink water or other fluids as directed. Urinate often, even at night. In some cases, you may be given additional medicines to help with side effects. Follow all directions for their use. Call your doctor or health care professional for advice if you get a fever, chills or sore throat, or other symptoms of a cold or flu. Do  not treat yourself. This drug decreases your body's ability to fight infections. Try to avoid being around people who are sick. This medicine may increase your risk to bruise or bleed. Call your doctor or health care professional if you notice any unusual bleeding. Be careful brushing and flossing your teeth or using a toothpick because you may get an infection or bleed more easily. If you have any dental work done, tell your dentist you are receiving this medicine. You may get drowsy or dizzy. Do not drive, use machinery, or do anything that needs mental alertness until you know how this medicine affects you. Do not become pregnant while taking this medicine or for 1 year after stopping it. Women should inform their doctor if they wish to  become pregnant or think they might be pregnant. Men should not father a child while taking this medicine and for 4 months after stopping it. There is a potential for serious side effects to an unborn child. Talk to your health care professional or pharmacist for more information. Do not breast-feed an infant while taking this medicine. This medicine may interfere with the ability to have a child. This medicine has caused ovarian failure in some women. This medicine has caused reduced sperm counts in some men. You should talk with your doctor or health care professional if you are concerned about your fertility. If you are going to have surgery, tell your doctor or health care professional that you have taken this medicine. What side effects may I notice from receiving this medicine? Side effects that you should report to your doctor or health care professional as soon as possible:  allergic reactions like skin rash, itching or hives, swelling of the face, lips, or tongue  low blood counts - this medicine may decrease the number of white blood cells, red blood cells and platelets. You may be at increased risk for infections and bleeding.  signs of infection - fever or chills, cough, sore throat, pain or difficulty passing urine  signs of decreased platelets or bleeding - bruising, pinpoint red spots on the skin, black, tarry stools, blood in the urine  signs of decreased red blood cells - unusually weak or tired, fainting spells, lightheadedness  breathing problems  dark urine  dizziness  palpitations  swelling of the ankles, feet, hands  trouble passing urine or change in the amount of urine  weight gain  yellowing of the eyes or skin Side effects that usually do not require medical attention (report to your doctor or health care professional if they continue or are bothersome):  changes in nail or skin color  hair loss  missed menstrual periods  mouth sores  nausea,  vomiting This list may not describe all possible side effects. Call your doctor for medical advice about side effects. You may report side effects to FDA at 1-800-FDA-1088. Where should I keep my medicine? This drug is given in a hospital or clinic and will not be stored at home. NOTE: This sheet is a summary. It may not cover all possible information. If you have questions about this medicine, talk to your doctor, pharmacist, or health care provider.  2020 Elsevier/Gold Standard (2012-04-16 16:22:58)   Goserelin injection(Zoladex) What is this medicine? GOSERELIN (GOE se rel in) is similar to a hormone found in the body. It lowers the amount of sex hormones that the body makes. Men will have lower testosterone levels and women will have lower estrogen levels while taking this medicine. In men, this  medicine is used to treat prostate cancer; the injection is either given once per month or once every 12 weeks. A once per month injection (only) is used to treat women with endometriosis, dysfunctional uterine bleeding, or advanced breast cancer. This medicine may be used for other purposes; ask your health care provider or pharmacist if you have questions. COMMON BRAND NAME(S): Zoladex What should I tell my health care provider before I take this medicine? They need to know if you have any of these conditions:  bone problems  diabetes  heart disease  history of irregular heartbeat  an unusual or allergic reaction to goserelin, other medicines, foods, dyes, or preservatives  pregnant or trying to get pregnant  breast-feeding How should I use this medicine? This medicine is for injection under the skin. It is given by a health care professional in a hospital or clinic setting. Talk to your pediatrician regarding the use of this medicine in children. Special care may be needed. Overdosage: If you think you have taken too much of this medicine contact a poison control center or emergency  room at once. NOTE: This medicine is only for you. Do not share this medicine with others. What if I miss a dose? It is important not to miss your dose. Call your doctor or health care professional if you are unable to keep an appointment. What may interact with this medicine? Do not take this medicine with any of the following medications:  cisapride  dronedarone  pimozide  thioridazine This medicine may also interact with the following medications:  other medicines that prolong the QT interval (an abnormal heart rhythm) This list may not describe all possible interactions. Give your health care provider a list of all the medicines, herbs, non-prescription drugs, or dietary supplements you use. Also tell them if you smoke, drink alcohol, or use illegal drugs. Some items may interact with your medicine. What should I watch for while using this medicine? Visit your doctor or health care provider for regular checks on your progress. Your symptoms may appear to get worse during the first weeks of this therapy. Tell your doctor or healthcare provider if your symptoms do not start to get better or if they get worse after this time. Your bones may get weaker if you take this medicine for a long time. If you smoke or frequently drink alcohol you may increase your risk of bone loss. A family history of osteoporosis, chronic use of drugs for seizures (convulsions), or corticosteroids can also increase your risk of bone loss. Talk to your doctor about how to keep your bones strong. This medicine should stop regular monthly menstruation in women. Tell your doctor if you continue to menstruate. Women should not become pregnant while taking this medicine or for 12 weeks after stopping this medicine. Women should inform their doctor if they wish to become pregnant or think they might be pregnant. There is a potential for serious side effects to an unborn child. Talk to your health care professional or  pharmacist for more information. Do not breast-feed an infant while taking this medicine. Men should inform their doctors if they wish to father a child. This medicine may lower sperm counts. Talk to your health care professional or pharmacist for more information. This medicine may increase blood sugar. Ask your healthcare provider if changes in diet or medicines are needed if you have diabetes. What side effects may I notice from receiving this medicine? Side effects that you should report to your  doctor or health care professional as soon as possible:  allergic reactions like skin rash, itching or hives, swelling of the face, lips, or tongue  bone pain  breathing problems  changes in vision  chest pain  feeling faint or lightheaded, falls  fever, chills  pain, swelling, warmth in the leg  pain, tingling, numbness in the hands or feet  signs and symptoms of high blood sugar such as being more thirsty or hungry or having to urinate more than normal. You may also feel very tired or have blurry vision  signs and symptoms of low blood pressure like dizziness; feeling faint or lightheaded, falls; unusually weak or tired  stomach pain  swelling of the ankles, feet, hands  trouble passing urine or change in the amount of urine  unusually high or low blood pressure  unusually weak or tired Side effects that usually do not require medical attention (report to your doctor or health care professional if they continue or are bothersome):  change in sex drive or performance  changes in breast size in both males and females  changes in emotions or moods  headache  hot flashes  irritation at site where injected  loss of appetite  skin problems like acne, dry skin  vaginal dryness This list may not describe all possible side effects. Call your doctor for medical advice about side effects. You may report side effects to FDA at 1-800-FDA-1088. Where should I keep my  medicine? This drug is given in a hospital or clinic and will not be stored at home. NOTE: This sheet is a summary. It may not cover all possible information. If you have questions about this medicine, talk to your doctor, pharmacist, or health care provider.  2020 Elsevier/Gold Standard (2018-09-20 14:05:56)

## 2019-05-26 NOTE — Progress Notes (Signed)
[facility] CSW Progress Notes  Met briefly with patient in infusion w interpreter [name].  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  Enrolled in [facility] and provided first of 4 distributions of $50 gas card.  Gave applications for [employer] in [place].  Asked [name] to review w patient.  Spoke with [facility] to determine if there were additional resources available.  Program may be willing to provide community health worker despite living in [place].  Program also recommended referral to [facility] if rent/utilities become an issue.    Edwyna Shell, LCSW Clinical Social Worker Phone:  [phone] Cell:  [phone]

## 2019-05-26 NOTE — Telephone Encounter (Signed)
Use pregnancy test/result from 05/24/2019 for treatment today.

## 2019-05-26 NOTE — Progress Notes (Signed)
Met with patient/interpreter to obtain income information for grant.  Patient approved for one-time $1000 [employer] grant to assist with personal expenses while going through treatment. Patient has a copy of the approval letter and expense sheet along with the [facility] OP pharmacy information.She received a gas card today from her grant.   She has my card for any additional financial questions or concerns.

## 2019-05-26 NOTE — Progress Notes (Signed)
[facility] CSW Progress Notes  Application to [employer] submitted via secure email.  Patient will hear directly from foundation if approved for $300 grant.  Edwyna Shell, LCSW Clinical Social Worker Phone:  [phone]

## 2019-05-28 ENCOUNTER — Inpatient Hospital Stay: Payer: No Typology Code available for payment source

## 2019-05-28 ENCOUNTER — Other Ambulatory Visit: Payer: Self-pay

## 2019-05-28 VITALS — BP 177/83 | HR 60 | Temp 97.3°F | Resp 18

## 2019-05-28 DIAGNOSIS — Z171 Estrogen receptor negative status [ER-]: Secondary | ICD-10-CM

## 2019-05-28 MED ORDER — PEGFILGRASTIM-CBQV 6 MG/0.6ML ~~LOC~~ SOSY
6.0000 mg | PREFILLED_SYRINGE | Freq: Once | SUBCUTANEOUS | Status: AC
  Administered 2019-05-28: 6 mg via SUBCUTANEOUS

## 2019-05-31 ENCOUNTER — Encounter: Payer: Self-pay | Admitting: Adult Health

## 2019-05-31 NOTE — Progress Notes (Signed)
Received [facility] FAA and letter of support.  Placed in outgoing mail to [employer] Attn: Therapist, art.  They will notify patient of status when decision has been made.

## 2019-06-02 NOTE — Progress Notes (Signed)
[place]  [phone]) [phone] Fax:(336) [phone]     ID: Brianna Owens DOB: 24-Mar-1991  MR#: [id number]  SHF#:[id number]  Patient Care Team: Patient, [name] as PCP - General (General Practice) Merriel Zinger, [name], MD as Consulting Physician (Oncology) Erroll Luna, MD as Consulting Physician (General Surgery) [name], RN as Oncology Nurse Navigator [name], RN as Oncology Nurse Navigator Chauncey Cruel, MD OTHER MD:  CHIEF COMPLAINT: Triple negative breast cancer, BRCA1+  CURRENT TREATMENT: neoadjuvant chemotherapy   INTERVAL HISTORY: Brianna Owens returns today for follow up and treatment of her triple negative breast cancer.  Her translation came with her.  She began neoadjuvant chemotherapy consisting of doxorubicin and cyclophosphamide in dose dense fashion x4 on 05/26/2019.  Today is day 9 of cycle 1.  She tells me that she had nausea beginning the evening of chemo and continuing pretty much for the rest of the week.  She denies vomiting.  She says that nothing tastes good.  While some people have a feeling of early satiety she has a feeling that she could eat more and never feels full.  Her diet apparently has changed and somehow she understood that she could not eat cheese which is something she likes.  She did not report pain associated with the Neulasta.  She says some mornings she gets anxious and feels like she should just run away.  She has not yet begun to lose her hair.  Her most recent echocardiogram on 05/25/2019 showed an ejection fraction of 55-60%.   REVIEW OF SYSTEMS: A detailed review of systems today was otherwise noncontributory   HISTORY OF CURRENT ILLNESS: From the original intake note:  Brianna [facility] with a 3 month history of a right breast lump that became painful in early 04/2019. Physical exam performed at that time showed a  palpable, tender 13 cm lump within the right center breast under the nipple area. She underwent right breast ultrasonography at [facility] on 04/27/2019 showing: 6.1 cm palpable mass centered in the 5 o'clock position of the right breast; single right inferomedial axillary lymph node with focal cortical thickening inferiorly.  Accordingly on 05/04/2019 she proceeded to biopsy of the right breast area in question. The pathology from this procedure ([id number]) showed: invasive ductal carcinoma, grade 2-3. Prognostic indicators significant for: estrogen receptor, 0% negative and progesterone receptor, 0% negative. Proliferation marker Ki67 at 40%.  I do not find HER-2 receptor documentation  The right axillary lymph node biopsied at that time showed reactive germinal centers.  She underwent bilateral diagnostic mammography with tomography at [facility] on 05/05/2019 showing: breast density category D; biopsy-proven right breast malignancy measures 6.1 cm; no additional masses or calcifications seen in the right breast; no evidence of malignancy in the left breast.  The patient's subsequent history is as detailed below.   PAST MEDICAL HISTORY: No past medical history on file.  PAST SURGICAL HISTORY: Past Surgical History:  Procedure Laterality Date  . IR IMAGING GUIDED PORT INSERTION  05/24/2019    FAMILY HISTORY: Family History  Problem Relation Age of Onset  . Breast cancer Mother   . Breast cancer Maternal Aunt    Patient's father is 49 and her mother 70 as of November 2020.  The patient's mother was diagnosed with breast cancer in her early 34s.  She lives in [place]. The patient's mother's sister was also diagnosed with breast cancer in  her early 30s.  The patient herself has 1 sister, no brothers.  She is not aware of any ovarian cancer cases in the family.   GYNECOLOGIC HISTORY:  Patient's last menstrual period was 05/24/2019. Menarche: 28 years old Age at first  live birth: 28 years old GX P 2 LMPregular Contraceptive HRT n/a  Hysterectomy? no BSO? no   SOCIAL HISTORY: (updated 04/2019)  Brianna Owens is currently working at [employer].  She is originally from [place].  She is divorced.  Her children are Luis Angel, 5 years old, living in [place] with his father, and Mia, 2 years old, who lives with the patient.  Also at home are her uncle Roberto Gonzalez, his wife, and that wife's cousin.     ADVANCED DIRECTIVES: Not in place   HEALTH MAINTENANCE: Social History   Tobacco Use  . Smoking status: Never Smoker  . Smokeless tobacco: Never Used  Substance Use Topics  . Alcohol use: Not Currently  . Drug use: Not Currently     Colonoscopy: n/a  PAP: 04/26/2019, negative  Bone density: n/a   No Known Allergies  Current Outpatient Medications  Medication Sig Dispense Refill  . dexamethasone (DECADRON) 4 MG tablet Take 2 tablets by mouth once a day on the day after chemotherapy and then take 2 tablets two times a day for 2 days. Take with food. 30 tablet 1  . lidocaine-prilocaine (EMLA) cream Apply to affected area once 30 g 3  . loratadine (CLARITIN) 10 MG tablet Take 1 tablet (10 mg total) by mouth daily. 60 tablet 0  . prochlorperazine (COMPAZINE) 10 MG tablet Take 1 tablet (10 mg total) by mouth every 6 (six) hours as needed (Nausea or vomiting). 30 tablet 1  . venlafaxine XR (EFFEXOR-XR) 75 MG 24 hr capsule Take 1 capsule (75 mg total) by mouth daily with breakfast. 30 capsule 4   No current facility-administered medications for this visit.    OBJECTIVE: Young Spanish speaker in no acute distress  Vitals:   06/03/19 0833  BP: 108/65  Pulse: 86  Resp: 18  Temp: 98.2 F (36.8 C)  SpO2: 100%     Body mass index is 25.46 kg/m.   Wt Readings from Last 3 Encounters:  06/03/19 153 lb (69.4 kg)  05/26/19 154 lb 8 oz (70.1 kg)  05/24/19 157 lb 13.6 oz (71.6 kg)      ECOG FS:1 - Symptomatic but completely ambulatory  Sclerae  unicteric, EOMs intact Wearing a mask No cervical or supraclavicular adenopathy Lungs no rales or rhonchi Heart regular rate and rhythm Abd soft, nontender, positive bowel sounds MSK no focal spinal tenderness, no upper extremity lymphedema Neuro: nonfocal, well oriented, appropriate affect Breasts: The mass in the right breast is still palpable but the patient feels it is a little smaller and a little softer which is encouraging.  There is no overlying skin involvement.  Both axillae are benign.   LAB RESULTS:  CMP     Component Value Date/Time   NA 141 05/26/2019 0811   K 4.1 05/26/2019 0811   CL 107 05/26/2019 0811   CO2 25 05/26/2019 0811   GLUCOSE 95 05/26/2019 0811   BUN 11 05/26/2019 0811   CREATININE 0.64 05/26/2019 0811   CALCIUM 9.0 05/26/2019 0811   PROT 8.4 (H) 05/26/2019 0811   ALBUMIN 4.2 05/26/2019 0811   AST 37 05/26/2019 0811   ALT 59 (H) 05/26/2019 0811   ALKPHOS 98 05/26/2019 0811   BILITOT 0.4 05/26/2019 0811     GFRNONAA >60 05/26/2019 0811   GFRAA >60 05/26/2019 0811    No results found for: TOTALPROTELP, ALBUMINELP, A1GS, A2GS, BETS, BETA2SER, GAMS, MSPIKE, SPEI  No results found for: KPAFRELGTCHN, LAMBDASER, KAPLAMBRATIO  Lab Results  Component Value Date   WBC 4.7 06/03/2019   NEUTROABS 2.1 06/03/2019   HGB 11.6 (L) 06/03/2019   HCT 34.8 (L) 06/03/2019   MCV 91.6 06/03/2019   PLT 157 06/03/2019    No results found for: LABCA2  No components found for: LABCAN125  No results for input(s): INR in the last 168 hours.  No results found for: LABCA2  No results found for: CAN199  No results found for: CAN125  No results found for: CAN153  No results found for: CA2729  No components found for: HGQUANT  No results found for: CEA1 / No results found for: CEA1   No results found for: AFPTUMOR  No results found for: CHROMOGRNA  No results found for: PSA1  Appointment on 06/03/2019  Component Date Value Ref Range Status  . WBC  06/03/2019 4.7  4.0 - 10.5 K/uL Final  . RBC 06/03/2019 3.80* 3.87 - 5.11 MIL/uL Final  . Hemoglobin 06/03/2019 11.6* 12.0 - 15.0 g/dL Final  . HCT 06/03/2019 34.8* 36.0 - 46.0 % Final  . MCV 06/03/2019 91.6  80.0 - 100.0 fL Final  . MCH 06/03/2019 30.5  26.0 - 34.0 pg Final  . MCHC 06/03/2019 33.3  30.0 - 36.0 g/dL Final  . RDW 06/03/2019 11.2* 11.5 - 15.5 % Final  . Platelets 06/03/2019 157  150 - 400 K/uL Final  . nRBC 06/03/2019 0.0  0.0 - 0.2 % Final  . Neutrophils Relative % 06/03/2019 45  % Final  . Neutro Abs 06/03/2019 2.1  1.7 - 7.7 K/uL Final  . Lymphocytes Relative 06/03/2019 33  % Final  . Lymphs Abs 06/03/2019 1.5  0.7 - 4.0 K/uL Final  . Monocytes Relative 06/03/2019 14  % Final  . Monocytes Absolute 06/03/2019 0.7  0.1 - 1.0 K/uL Final  . Eosinophils Relative 06/03/2019 2  % Final  . Eosinophils Absolute 06/03/2019 0.1  0.0 - 0.5 K/uL Final  . Basophils Relative 06/03/2019 1  % Final  . Basophils Absolute 06/03/2019 0.1  0.0 - 0.1 K/uL Final  . Immature Granulocytes 06/03/2019 5  % Final   Increased IG's, likely caused by Bone Marrow Colony Stimulating Factor received within 30 days.  . Abs Immature Granulocytes 06/03/2019 0.25* 0.00 - 0.07 K/uL Final   Performed at [facility], [address] [place], [place] [address]    (this displays the last labs from the last 3 days)  No results found for: TOTALPROTELP, ALBUMINELP, A1GS, A2GS, BETS, BETA2SER, GAMS, MSPIKE, SPEI (this displays SPEP labs)  No results found for: KPAFRELGTCHN, LAMBDASER, KAPLAMBRATIO (kappa/lambda light chains)  No results found for: HGBA, HGBA2QUANT, HGBFQUANT, HGBSQUAN (Hemoglobinopathy evaluation)   No results found for: LDH  No results found for: IRON, TIBC, IRONPCTSAT (Iron and TIBC)  No results found for: FERRITIN  Urinalysis No results found for: COLORURINE, APPEARANCEUR, LABSPEC, PHURINE, GLUCOSEU, HGBUR, BILIRUBINUR, KETONESUR, PROTEINUR, UROBILINOGEN,  NITRITE, LEUKOCYTESUR   STUDIES: CT Chest W Contrast  Result Date: 05/25/2019 CLINICAL DATA:  Breast cancer, staging. EXAM: CT CHEST WITH CONTRAST TECHNIQUE: Multidetector CT imaging of the chest was performed during intravenous contrast administration. CONTRAST:  75mL OMNIPAQUE IOHEXOL 300 MG/ML  SOLN COMPARISON:  None. FINDINGS: Cardiovascular: Left IJ Port-A-Cath terminates in the right atrium. Vascular structures are otherwise unremarkable.   Heart is mildly enlarged and left ventricle appears dilated. No pericardial effusion. Mediastinum/Nodes: Partially imaged submental lymph nodes measure up to 10 mm (2/4). Prevascular lymph nodes measure up to 1.4 cm. No additional pathologically enlarged mediastinal or hilar lymph nodes. Enlarged right axillary lymph nodes measure up to 1.5 cm. Right subpectoral lymph nodes measure up to 11 mm (2/31). Left axillary lymph nodes are not enlarged by CT size criteria. Esophagus is unremarkable. Lungs/Pleura: 3 mm subpleural medial right middle lobe nodule (5/91), nonspecific and likely benign. Lungs are otherwise clear. No pleural fluid. Airway is unremarkable. Upper Abdomen: Liver may be slightly decreased in attenuation diffusely. Visualized portions of the liver, adrenal glands, kidneys, spleen, pancreas, stomach and bowel are otherwise grossly unremarkable. Musculoskeletal: Hyperattenuating mass in the right breast measures 5.6 x 7.0 cm. No worrisome lytic or sclerotic lesions. IMPRESSION: 1. Large right breast mass with metastatic right axillary, right subpectoral and prevascular adenopathy. 2. Borderline enlarged submental lymph nodes, incompletely imaged. 3. Left ventricle may be somewhat dilated. 4. Liver may be steatotic. Electronically Signed   By: Melinda  Blietz M.D.   On: 05/25/2019 10:29   NM Bone Scan Whole Body  Result Date: 05/25/2019 CLINICAL DATA:  EOV 19.6 MCI TC99 MDP LAC @0905/CS DX: R BREST CA, METS SUSPECTED PT STATES NO PAIN, NO BONE SX, NO  FALLS, NO FX^19.6millicurie TC-MDP TECHNETIUM TC 99M MEDRONATE IV KITBreast cancer, mets suspected, staging EXAM: NUCLEAR MEDICINE WHOLE BODY BONE SCAN TECHNIQUE: Whole body anterior and posterior images were obtained approximately 3 hours after intravenous injection of radiopharmaceutical. RADIOPHARMACEUTICALS:  19.6 mCi Technetium-99m MDP IV COMPARISON:  CT 05/25/2019 FINDINGS: No abnormal radiotracer accumulation within the axillary or appendicular skeleton to suggest metastatic disease. Sacrum partially obscured by bladder activity. IMPRESSION: No scintigraphic evidence skeletal metastasis. Electronically Signed   By: Stewart  Edmunds M.D.   On: 05/25/2019 16:44   MR BREAST BILATERAL W WO CONTRAST INC CAD  Result Date: 05/18/2019 CLINICAL DATA:  28-year-old female with newly diagnosed invasive ductal carcinoma of the right breast. An abnormal right axillary lymph node was also biopsied. This was negative for metastatic disease, but consider discordant. Strong family history of breast cancer in mother and aunt diagnosed in their 30s. LABS:  None performed today on site. EXAM: BILATERAL BREAST MRI WITH AND WITHOUT CONTRAST TECHNIQUE: Multiplanar, multisequence MR images of both breasts were obtained prior to and following the intravenous administration of 7 ml of Gadavist. Three-dimensional MR images were rendered by post-processing of the original MR data on an independent workstation. The three-dimensional MR images were interpreted, and findings are reported in the following complete MRI report for this study. Three dimensional images were evaluated at the independent DynaCad workstation COMPARISON:  Previous exam(s). FINDINGS: Breast composition: d. Extreme fibroglandular tissue. Background parenchymal enhancement: Moderate. Right breast: There is an oval, circumscribed rapidly enhancing mass in the central right breast consistent with the patient's biopsy-proven malignancy. It measures 7.1 x 5.6 x 7.0 cm  (transverse by AP by craniocaudal dimensions). No additional suspicious enhancing masses or non mass enhancement is identified within the right breast. There is no involvement of the overlying skin or underlying pectoralis muscle. Left breast: No suspicious mass or abnormal enhancement. Lymph nodes: At least 7 morphologically abnormal, suspicious level 1 right axillary lymph nodes are noted (series 5, images 25-62 of 144). Additionally, at least 3 prominent level 3 lymph nodes are partially visualized along the upper limits of the study (series 5 images 25-29 of 144). These are asymmetric from the   contralateral side and suspicious for involvement of disease. A suspicious 7 x 10 mm right internal mammary chain lymph node is identified centrally (series 5, image 86 of 144). A higher 9 mm internal mammary chain node is also asymmetric and suspicious (series 5, image 43/144). Ancillary findings:  None. IMPRESSION: 1. 7.1 cm right breast mass consistent with the patient's biopsy-proven malignancy. No other suspicious findings within the right breast. 2. At least seven morphologically abnormal level I right axillary lymph nodes. Three suspicious level III lymph nodes partially visualized along the superior limits of the study. 3. Two suspicious 9-10 mm right internal mammary chain lymph nodes. 4. No MRI evidence of malignancy on the left. RECOMMENDATION: Per clinical treatment plan. BI-RADS CATEGORY  6: Known biopsy-proven malignancy. Electronically Signed   By: Kristopher Oppenheim M.D.   On: 05/18/2019 14:30   ECHOCARDIOGRAM COMPLETE  Result Date: 05/25/2019   ECHOCARDIOGRAM REPORT   Patient Name:   Brianna Owens [facility] Date of Exam: 05/25/2019 Medical Rec #:  [id number]                   Height:       65.0 in Accession #:    [id number]                  Weight:       157.8 lb Date of Birth:  [birth year]                   BSA:          1.79 m Patient Age:    28 years                    BP:           115/70 mmHg Patient  Gender: F                           HR:           68 bpm. Exam Location:  Outpatient Procedure: 2D Echo, Cardiac Doppler and Color Doppler Indications:    Chemo evaluation  History:        Patient has no prior history of Echocardiogram examinations.                 Breast cancer, chemo.  Sonographer:    Dustin Flock Referring Phys: [place]  1. Left ventricular ejection fraction, by visual estimation, is 55 to 60%. The left ventricle has normal function. There is no left ventricular hypertrophy.  2. The left ventricle has no regional wall motion abnormalities.  3. GLS underestimated due to poor endocardial tracking.  4. Global right ventricle has normal systolic function.The right ventricular size is normal. No increase in right ventricular wall thickness.  5. Left atrial size was normal.  6. Right atrial size was normal.  7. Trivial pericardial effusion is present.  8. The mitral valve is normal in structure. No evidence of mitral valve regurgitation.  9. The tricuspid valve is normal in structure. Tricuspid valve regurgitation is trivial. 10. The aortic valve is normal in structure. Aortic valve regurgitation is not visualized. 11. The pulmonic valve was normal in structure. Pulmonic valve regurgitation is not visualized. 12. The average left ventricular global longitudinal strain is -13.8 %. FINDINGS  Left Ventricle: Left ventricular ejection fraction, by visual estimation, is 55 to 60%. The left ventricle has normal function. The average left ventricular global longitudinal  strain is -13.8 %. The left ventricle has no regional wall motion abnormalities. There is no left ventricular hypertrophy. Left ventricular diastolic parameters were normal. GLS underestimated due to poor endocardial tracking. Right Ventricle: The right ventricular size is normal. No increase in right ventricular wall thickness. Global RV systolic function is has normal systolic function. Left Atrium: Left  atrial size was normal in size. Right Atrium: Right atrial size was normal in size Pericardium: Trivial pericardial effusion is present. Mitral Valve: The mitral valve is normal in structure. No evidence of mitral valve regurgitation. Tricuspid Valve: The tricuspid valve is normal in structure. Tricuspid valve regurgitation is trivial. Aortic Valve: The aortic valve is normal in structure. Aortic valve regurgitation is not visualized. Pulmonic Valve: The pulmonic valve was normal in structure. Pulmonic valve regurgitation is not visualized. Pulmonic regurgitation is not visualized. Aorta: The aortic root and ascending aorta are structurally normal, with no evidence of dilitation. IAS/Shunts: No atrial level shunt detected by color flow Doppler.  LEFT VENTRICLE PLAX 2D LVIDd:         4.50 cm  Diastology LVIDs:         3.20 cm  LV e' lateral:   10.30 cm/s LV PW:         0.90 cm  LV E/e' lateral: 6.9 LV IVS:        0.90 cm  LV e' medial:    8.38 cm/s LVOT diam:     2.10 cm  LV E/e' medial:  8.4 LV SV:         51 ml LV SV Index:   28.19    2D Longitudinal Strain LVOT Area:     3.46 cm 2D Strain GLS Avg:     -13.8 %  RIGHT VENTRICLE RV Basal diam:  2.20 cm RV S prime:     12.00 cm/s TAPSE (M-mode): 2.5 cm LEFT ATRIUM             Index       RIGHT ATRIUM          Index LA diam:        2.90 cm 1.62 cm/m  RA Area:     9.97 cm LA Vol (A2C):   39.9 ml 22.30 ml/m RA Volume:   20.40 ml 11.40 ml/m LA Vol (A4C):   45.2 ml 25.27 ml/m LA Biplane Vol: 44.2 ml 24.71 ml/m  AORTIC VALVE LVOT Vmax:   95.30 cm/s LVOT Vmean:  52.400 cm/s LVOT VTI:    0.139 m  AORTA Ao Root diam: 2.70 cm MITRAL VALVE MV Area (PHT): 6.83 cm            SHUNTS MV PHT:        32.19 msec          Systemic VTI:  0.14 m MV Decel Time: 111 msec            Systemic Diam: 2.10 cm MV E velocity: 70.70 cm/s 103 cm/s  Brianna Bickers MD Electronically signed by Brianna Bickers MD Signature Date/Time: 05/25/2019/1:56:36 PM    Final    [place] AXILLARY NODE CORE  BIOPSY RIGHT  Addendum Date: 05/05/2019   ADDENDUM REPORT: 05/05/2019 14:58 ADDENDUM: Pathology revealed GRADE II/III INVASIVE DUCTAL CARCINOMA of the Right breast, 5 o'clock. This was found to be concordant by Dr. Lovey Newcomer. Pathology revealed LYMPH NODE WITH REACTIVE GERMINAL CENTERS of the Right axilla. This was found to be discordant by Dr. Lovey Newcomer, with targeted node excision recommended. Pathology results  were discussed with the patient by telephone by Lattie Corns, Bilingual Patient Services Representative. The patient reported doing well after the biopsy with tenderness at the site. Post biopsy instructions and care were reviewed and questions were answered. The patient was encouraged to call [facility] for any additional concerns. Surgical consultation has been arranged with Dr. Erroll Luna at [facility] on May 06, 2019. The patient returned to [facility] on May 05, 2019 for a bilateral diagnostic mammogram. This procedure is dictated in a separate report. Recommendation for a bilateral breast MRI given her age, breast density and strong family history. Pathology results reported by Terie Purser, RN on 05/05/2019. Electronically Signed   By: Lovey Newcomer M.D.   On: 05/05/2019 14:58   Result Date: 05/05/2019 CLINICAL DATA:  Patient with cortically thickened right axillary lymph node. EXAM: [place] AXILLARY NODE CORE BIOPSY RIGHT COMPARISON:  Previous exam(s). FINDINGS: I met with the patient and we discussed the procedure of ultrasound-guided biopsy, including benefits and alternatives. We discussed the high likelihood of a successful procedure. We discussed the risks of the procedure, including infection, bleeding, tissue injury, clip migration, and inadequate sampling. Informed written consent was given. The usual time-out protocol was performed immediately prior to the procedure. Using sterile technique and 1% Lidocaine as local  anesthetic, under direct ultrasound visualization, a 14 gauge spring-loaded device was used to perform biopsy of right axillary lymph using a lateral approach. At the conclusion of the procedure [facility] tissue marker clip was deployed into the biopsy cavity. Follow up 2 view mammogram was performed and dictated separately. IMPRESSION: Ultrasound guided biopsy of right axillary lymph node. No apparent complications. Electronically Signed: By: Lovey Newcomer M.D. 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 An abnormal lymph node in the right axilla was biopsied and was negative for metastatic disease, however considered discordant given its abnormal appearance. The patient returns today for bilateral diagnostic mammography as this was not initially performed due to her young age. EXAM: DIGITAL DIAGNOSTIC BILATERAL MAMMOGRAM WITH CAD AND TOMO COMPARISON:  Prior right breast ultrasounds dated 04/27/2019 and 05/04/2019. ACR Breast Density Category d: The breast tissue is extremely dense, which lowers the sensitivity of mammography. FINDINGS: Large round mass within the central to slightly inner right breast containing a Q shaped biopsy marking clip compatible with biopsy proven malignancy measures 6.1 cm. No definite additional masses identified. No calcifications seen in the right breast. An abnormal/enlarged lymph node containing a HydroMARK clip is partially visualized within the right axilla. No suspicious masses or calcifications are identified in the left breast. Mammographic images were processed with CAD. IMPRESSION: 1. Biopsy proven malignancy in the right breast measures 6.1 cm mammographically with the previously biopsied morphologically abnormal lymph node partially  visualized in the right axilla. No additional masses or calcifications seen in the right breast. 2.  No mammographic evidence of malignancy in the left breast. RECOMMENDATION: 1.  Treatment plan for known right breast malignancy. 2. Given extremely dense fibroglandular tissue, consider contrast enhanced breast MRI. The findings and recommendations were discussed with the patient via [place] interpreter, Santa Lighter. The patient is aware of her biopsy results (biopsy performed yesterday) and was informed of her appointment at [facility] surgery scheduled for tomorrow.  I have discussed the findings and recommendations with the patient. If applicable, a reminder letter will be sent to the patient regarding the next appointment. BI-RADS CATEGORY  6: Known biopsy-proven malignancy. Electronically Signed   By: Jennifer  Jarosz M.D.   On: 05/05/2019 14:09   US RT BREAST BX W LOC DEV 1ST LESION IMG BX SPEC US GUIDE  Result Date: 05/04/2019 CLINICAL DATA:  Patient with large palpable right breast mass. EXAM: ULTRASOUND GUIDED RIGHT BREAST CORE NEEDLE BIOPSY COMPARISON:  Previous exam(s). FINDINGS: I met with the patient and we discussed the procedure of ultrasound-guided biopsy, including benefits and alternatives. We discussed the high likelihood of a successful procedure. We discussed the risks of the procedure, including infection, bleeding, tissue injury, clip migration, and inadequate sampling. Informed written consent was given. The usual time-out protocol was performed immediately prior to the procedure. Lesion quadrant: Lower inner quadrant Using sterile technique and 1% Lidocaine as local anesthetic, under direct ultrasound visualization, a 14 gauge spring-loaded device was used to perform biopsy of right breast mass 5 o'clock position using a lateral approach. At the conclusion of the procedure Q shaped tissue marker clip was deployed into the biopsy cavity. IMPRESSION: Ultrasound guided biopsy of  right breast mass. No apparent complications. Electronically Signed   By: Drew  Davis M.D.   On: 05/04/2019 14:20   IR IMAGING GUIDED PORT INSERTION  Result Date: 05/24/2019 INDICATION: History of right-sided breast cancer. In need of durable intravenous access for chemotherapy administration EXAM: IMPLANTED PORT A CATH PLACEMENT WITH ULTRASOUND AND FLUOROSCOPIC GUIDANCE COMPARISON:  None. MEDICATIONS: Ancef 2 gm IV; The antibiotic was administered within an appropriate time interval prior to skin puncture. ANESTHESIA/SEDATION: Moderate (conscious) sedation was employed during this procedure. A total of Versed 1.5 mg and Fentanyl 75 mcg was administered intravenously. Moderate Sedation Time: 19 minutes. The patient's level of consciousness and vital signs were monitored continuously by radiology nursing throughout the procedure under my direct supervision. CONTRAST:  None FLUOROSCOPY TIME:  42 seconds (8 mGy) COMPLICATIONS: None immediate. PROCEDURE: The procedure, risks, benefits, and alternatives were explained to the patient via the use of a medical translator. Questions regarding the procedure were encouraged and answered. The patient understands and consents to the procedure. Given the presence of the right-sided breast cancer decision was made to place a left internal jugular approach port a catheter. As such, the left neck and chest were prepped with chlorhexidine in a sterile fashion, and a sterile drape was applied covering the operative field. Maximum barrier sterile technique with sterile gowns and gloves were used for the procedure. A timeout was performed prior to the initiation of the procedure. Local anesthesia was provided with 1% lidocaine with epinephrine. After creating a small venotomy incision, a micropuncture kit was utilized to access the internal jugular vein. Real-time ultrasound guidance was utilized for vascular access including the acquisition of a permanent ultrasound image  documenting patency of the accessed vessel. The microwire was utilized to measure appropriate catheter length. A subcutaneous port pocket was then created along the upper chest wall utilizing a combination of sharp and blunt dissection. The pocket was irrigated with sterile saline. A single lumen ISP power injectable port was chosen for placement. The 8 Fr catheter was tunneled from the port pocket site to the venotomy incision. The port was placed in the pocket. The external catheter was trimmed to appropriate length. At the venotomy, an 8 Fr peel-away sheath was placed over a guidewire under fluoroscopic guidance. The catheter was   then placed through the sheath and the sheath was removed. Final catheter positioning was confirmed and documented with a fluoroscopic spot radiograph. The port was accessed with a Huber needle, aspirated and flushed with heparinized saline. The venotomy site was closed with an interrupted 4-0 Vicryl suture. The port pocket incision was closed with interrupted 2-0 Vicryl suture and the skin was opposed with a running subcuticular 4-0 Vicryl suture. Dermabond and Steri-strips were applied to both incisions. Dressings were placed. The patient tolerated the procedure well without immediate post procedural complication. FINDINGS: After catheter placement, the tip lies within the superior cavoatrial junction. The catheter aspirates and flushes normally and is ready for immediate use. IMPRESSION: Successful placement of a left internal jugular approach power injectable Port-A-Cath. The catheter is ready for immediate use. Electronically Signed   By: John  Watts M.D.   On: 05/24/2019 13:00    ELIGIBLE FOR AVAILABLE RESEARCH PROTOCOL:no  ASSESSMENT: 28 y.o. BRCA1 positive [place] woman status post right breast overlapping sites biopsy 05/04/2019 for a clinical T3 N0, stage IIIB invasive ductal carcinoma, grade 2, triple negative, with an MIB-1 of 40%.  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  The Common Hereditary Gene Panel offered by [facility] includes sequencing and/or deletion duplication testing of the following 48 genes: APC, ATM, AXIN2, BARD1, BMPR1A, BRCA1, BRCA2, BRIP1, CDH1, CDK4, CDKN2A (p14ARF), CDKN2A (p16INK4a), CHEK2, CTNNA1, DICER1, EPCAM (Deletion/duplication testing only), GREM1 (promoter region deletion/duplication testing only), KIT, MEN1, MLH1, MSH2, MSH3, MSH6, MUTYH, NBN, NF1, NHTL1, PALB2, PDGFRA, PMS2, POLD1, POLE, PTEN, RAD50, RAD51C, RAD51D, RNF43, SDHB, SDHC, SDHD, SMAD4, SMARCA4. STK11, TP53, TSC1, TSC2, and VHL.  The following genes were evaluated for sequence changes only: SDHA and HOXB13 c.251G>A variant only. The report date is 05/24/2019.  (2) neoadjuvant chemotherapy consisting of doxorubicin and cyclophosphamide in dose dense fashion x4 started 05/26/2019 to be followed by paclitaxel and carboplatin weekly x12  (3) definitive surgery to follow  (4) adjuvant radiation to follow   PLAN: [place] did moderately well with her first cycle of chemotherapy.  She tells me that she did take the medications appropriately although she was somewhat vague as to what she took.  In any case she had significant problems with nausea although no vomiting.  She is dreading the idea of losing her hair.  I did my best to prepare her as that will definitely occur by the end of next week.  We reviewed her nausea medications.  She is also expressing some anxiety.  I am going to start her on venlafaxine and we discussed that as well.  I placed the prescription in for her at our [facility] pharmacy where she has an account.  We discussed diet issues and I am liberalizing her diet and letting her eat what she prefers.  We went over her counts which are excellent today.  I also  encouraged her in that the mass in the right breast may already be showing slight signs of response.  She will return in 1 week for cycle 2.  She knows to call for any other issue that may develop before that visit.   Gustav C Magrinat, MD   06/03/2019 9:05 AM Medical Oncology and Hematology [facility] [address] [place], [place] [address] Tel. [phone]    Fax. [phone]   This document serves as   a record of services personally performed by Gustav Magrinat, MD. It was created on his behalf by Katie Daubenspeck, a trained medical scribe. The creation of this record is based on the scribe's personal observations and the provider's statements to them.   I, Gustav Magrinat MD, have reviewed the above documentation for accuracy and completeness, and I agree with the above.   

## 2019-06-03 ENCOUNTER — Inpatient Hospital Stay (HOSPITAL_BASED_OUTPATIENT_CLINIC_OR_DEPARTMENT_OTHER): Payer: Self-pay | Admitting: Oncology

## 2019-06-03 ENCOUNTER — Inpatient Hospital Stay: Payer: Self-pay

## 2019-06-03 ENCOUNTER — Other Ambulatory Visit: Payer: Self-pay

## 2019-06-03 VITALS — BP 108/65 | HR 86 | Temp 98.2°F | Resp 18 | Ht 65.0 in | Wt 153.0 lb

## 2019-06-03 DIAGNOSIS — Z1501 Genetic susceptibility to malignant neoplasm of breast: Secondary | ICD-10-CM

## 2019-06-03 DIAGNOSIS — Z171 Estrogen receptor negative status [ER-]: Secondary | ICD-10-CM

## 2019-06-03 DIAGNOSIS — Z1502 Genetic susceptibility to malignant neoplasm of ovary: Secondary | ICD-10-CM

## 2019-06-03 DIAGNOSIS — C50811 Malignant neoplasm of overlapping sites of right female breast: Secondary | ICD-10-CM

## 2019-06-03 LAB — CBC WITH DIFFERENTIAL/PLATELET
Abs Immature Granulocytes: 0.25 10*3/uL — ABNORMAL HIGH (ref 0.00–0.07)
Basophils Absolute: 0.1 10*3/uL (ref 0.0–0.1)
Basophils Relative: 1 %
Eosinophils Absolute: 0.1 10*3/uL (ref 0.0–0.5)
Eosinophils Relative: 2 %
HCT: 34.8 % — ABNORMAL LOW (ref 36.0–46.0)
Hemoglobin: 11.6 g/dL — ABNORMAL LOW (ref 12.0–15.0)
Immature Granulocytes: 5 %
Lymphocytes Relative: 33 %
Lymphs Abs: 1.5 10*3/uL (ref 0.7–4.0)
MCH: 30.5 pg (ref 26.0–34.0)
MCHC: 33.3 g/dL (ref 30.0–36.0)
MCV: 91.6 fL (ref 80.0–100.0)
Monocytes Absolute: 0.7 10*3/uL (ref 0.1–1.0)
Monocytes Relative: 14 %
Neutro Abs: 2.1 10*3/uL (ref 1.7–7.7)
Neutrophils Relative %: 45 %
Platelets: 157 10*3/uL (ref 150–400)
RBC: 3.8 MIL/uL — ABNORMAL LOW (ref 3.87–5.11)
RDW: 11.2 % — ABNORMAL LOW (ref 11.5–15.5)
WBC: 4.7 10*3/uL (ref 4.0–10.5)
nRBC: 0 % (ref 0.0–0.2)

## 2019-06-03 LAB — COMPREHENSIVE METABOLIC PANEL
ALT: 37 U/L (ref 0–44)
AST: 15 U/L (ref 15–41)
Albumin: 3.9 g/dL (ref 3.5–5.0)
Alkaline Phosphatase: 95 U/L (ref 38–126)
Anion gap: 8 (ref 5–15)
BUN: 7 mg/dL (ref 6–20)
CO2: 27 mmol/L (ref 22–32)
Calcium: 9.1 mg/dL (ref 8.9–10.3)
Chloride: 104 mmol/L (ref 98–111)
Creatinine, Ser: 0.64 mg/dL (ref 0.44–1.00)
GFR calc Af Amer: >60 mL/min (ref >60)
GFR calc non Af Amer: >60 mL/min (ref >60)
Glucose, Bld: 95 mg/dL (ref 70–99)
Potassium: 4.2 mmol/L (ref 3.5–5.1)
Sodium: 139 mmol/L (ref 135–145)
Total Bilirubin: 0.3 mg/dL (ref 0.3–1.2)
Total Protein: 7.4 g/dL (ref 6.5–8.1)

## 2019-06-03 MED ORDER — VENLAFAXINE HCL ER 75 MG PO CP24: 75.0000 mg | ORAL_CAPSULE | Freq: Every day | ORAL | 4 refills | Status: DC

## 2019-06-09 ENCOUNTER — Encounter: Payer: Self-pay | Admitting: Adult Health

## 2019-06-09 ENCOUNTER — Inpatient Hospital Stay: Payer: Self-pay

## 2019-06-09 ENCOUNTER — Other Ambulatory Visit: Payer: Self-pay

## 2019-06-09 ENCOUNTER — Inpatient Hospital Stay (HOSPITAL_BASED_OUTPATIENT_CLINIC_OR_DEPARTMENT_OTHER): Payer: Self-pay | Admitting: Adult Health

## 2019-06-09 VITALS — BP 121/75 | HR 87 | Temp 98.3°F | Resp 20 | Ht 65.0 in | Wt 157.2 lb

## 2019-06-09 DIAGNOSIS — C50811 Malignant neoplasm of overlapping sites of right female breast: Secondary | ICD-10-CM

## 2019-06-09 DIAGNOSIS — Z171 Estrogen receptor negative status [ER-]: Secondary | ICD-10-CM

## 2019-06-09 LAB — CBC WITH DIFFERENTIAL/PLATELET
Abs Immature Granulocytes: 2.17 10*3/uL — ABNORMAL HIGH (ref 0.00–0.07)
Basophils Absolute: 0.1 10*3/uL (ref 0.0–0.1)
Basophils Relative: 1 %
Eosinophils Absolute: 0.1 10*3/uL (ref 0.0–0.5)
Eosinophils Relative: 0 %
HCT: 32 % — ABNORMAL LOW (ref 36.0–46.0)
Hemoglobin: 10.7 g/dL — ABNORMAL LOW (ref 12.0–15.0)
Immature Granulocytes: 15 %
Lymphocytes Relative: 15 %
Lymphs Abs: 2.1 10*3/uL (ref 0.7–4.0)
MCH: 30.5 pg (ref 26.0–34.0)
MCHC: 33.4 g/dL (ref 30.0–36.0)
MCV: 91.2 fL (ref 80.0–100.0)
Monocytes Absolute: 0.9 10*3/uL (ref 0.1–1.0)
Monocytes Relative: 6 %
Neutro Abs: 9.2 10*3/uL — ABNORMAL HIGH (ref 1.7–7.7)
Neutrophils Relative %: 63 %
Platelets: 164 10*3/uL (ref 150–400)
RBC: 3.51 MIL/uL — ABNORMAL LOW (ref 3.87–5.11)
RDW: 11.6 % (ref 11.5–15.5)
WBC: 14.5 10*3/uL — ABNORMAL HIGH (ref 4.0–10.5)
nRBC: 0.2 % (ref 0.0–0.2)

## 2019-06-09 LAB — COMPREHENSIVE METABOLIC PANEL
ALT: 81 U/L — ABNORMAL HIGH (ref 0–44)
AST: 36 U/L (ref 15–41)
Albumin: 3.9 g/dL (ref 3.5–5.0)
Alkaline Phosphatase: 124 U/L (ref 38–126)
Anion gap: 8 (ref 5–15)
BUN: 7 mg/dL (ref 6–20)
CO2: 26 mmol/L (ref 22–32)
Calcium: 9 mg/dL (ref 8.9–10.3)
Chloride: 107 mmol/L (ref 98–111)
Creatinine, Ser: 0.62 mg/dL (ref 0.44–1.00)
GFR calc Af Amer: >60 mL/min (ref >60)
GFR calc non Af Amer: >60 mL/min (ref >60)
Glucose, Bld: 102 mg/dL — ABNORMAL HIGH (ref 70–99)
Potassium: 4.1 mmol/L (ref 3.5–5.1)
Sodium: 141 mmol/L (ref 135–145)
Total Bilirubin: <0.2 mg/dL — ABNORMAL LOW (ref 0.3–1.2)
Total Protein: 7.5 g/dL (ref 6.5–8.1)

## 2019-06-09 LAB — PREGNANCY, URINE: Preg Test, Ur: NEGATIVE

## 2019-06-09 MED ORDER — SODIUM CHLORIDE 0.9 % IV SOLN
Freq: Once | INTRAVENOUS | Status: AC
  Filled 2019-06-09: qty 250

## 2019-06-09 MED ORDER — PALONOSETRON HCL INJECTION 0.25 MG/5ML
INTRAVENOUS | Status: AC
  Filled 2019-06-09: qty 5

## 2019-06-09 MED ORDER — DOXORUBICIN HCL CHEMO IV INJECTION 2 MG/ML
60.0000 mg/m2 | Freq: Once | INTRAVENOUS | Status: AC
  Administered 2019-06-09: 108 mg via INTRAVENOUS
  Filled 2019-06-09: qty 54

## 2019-06-09 MED ORDER — HEPARIN SOD (PORK) LOCK FLUSH 100 UNIT/ML IV SOLN
500.0000 [IU] | Freq: Once | INTRAVENOUS | Status: AC | PRN
  Administered 2019-06-09: 15:00:00 500 [IU]
  Filled 2019-06-09: qty 5

## 2019-06-09 MED ORDER — SODIUM CHLORIDE 0.9 % IV SOLN
Freq: Once | INTRAVENOUS | Status: AC
  Filled 2019-06-09: qty 5

## 2019-06-09 MED ORDER — SODIUM CHLORIDE 0.9% FLUSH
10.0000 mL | INTRAVENOUS | Status: DC | PRN
  Administered 2019-06-09: 15:00:00 10 mL
  Filled 2019-06-09: qty 10

## 2019-06-09 MED ORDER — SODIUM CHLORIDE 0.9 % IV SOLN
600.0000 mg/m2 | Freq: Once | INTRAVENOUS | Status: AC
  Administered 2019-06-09: 14:00:00 1080 mg via INTRAVENOUS
  Filled 2019-06-09: qty 54

## 2019-06-09 MED ORDER — PALONOSETRON HCL INJECTION 0.25 MG/5ML
0.2500 mg | Freq: Once | INTRAVENOUS | Status: AC
  Administered 2019-06-09: 0.25 mg via INTRAVENOUS

## 2019-06-09 NOTE — Progress Notes (Signed)
[name]  [phone]) [phone] [phone]) [phone]     ID: Brianna Owens DOB: Jul 30, 1990  MR#: [id number]  MVH#:[id number]  Patient Care Team: Patient, [name] as PCP - General (General Practice) Magrinat, [name], MD as Consulting Physician (Oncology) Erroll Luna, MD as Consulting Physician (General Surgery) [name], RN as Oncology Nurse Navigator [name], RN as Oncology Nurse Navigator Scot Dock, NP OTHER MD:  CHIEF COMPLAINT: Triple negative breast cancer, BRCA1+  CURRENT TREATMENT: neoadjuvant chemotherapy   INTERVAL HISTORY: [name] returns today for follow up and treatment of her triple negative breast cancer.  She is accompanied by spanish interpreter Lelan Pons.  She is receiving neoadjuvant chemotherapy consisting of doxorubicin and cyclophosphamide in dose dense fashion x4 with Neulasta support with on day 3.  Today is cycle 2 day 1 of therapy.    Her most recent echocardiogram on 05/25/2019 showed an ejection fraction of 55-60%.   REVIEW OF SYSTEMS: Brianna Owens is doing well.  She has no new issues such as fever, chills, chest pain, palpitations, cough, shortness of breath, bowel/bladder changes, nausea, vomiting, headaches, vision issues.  She denies fatigue.  She has her upcoming appointments and knows when she should return.  A detailed ROS was otherwise non contributory.    HISTORY OF CURRENT ILLNESS: From the original intake note:  [place] [facility] with a 3 month history of a right breast lump that became painful in early 04/2019. Physical exam performed at that time showed a palpable, tender 13 cm lump within the right center breast under the nipple area. She underwent right breast ultrasonography at [facility] on 04/27/2019 showing: 6.1 cm palpable mass centered in the 5 o'clock position of the right breast; single right  inferomedial axillary lymph node with focal cortical thickening inferiorly.  Accordingly on 05/04/2019 she proceeded to biopsy of the right breast area in question. The pathology from this procedure ([id number]) showed: invasive ductal carcinoma, grade 2-3. Prognostic indicators significant for: estrogen receptor, 0% negative and progesterone receptor, 0% negative. Proliferation marker Ki67 at 40%.  I do not find HER-2 receptor documentation  The right axillary lymph node biopsied at that time showed reactive germinal centers.  She underwent bilateral diagnostic mammography with tomography at [facility] on 05/05/2019 showing: breast density category D; biopsy-proven right breast malignancy measures 6.1 cm; no additional masses or calcifications seen in the right breast; no evidence of malignancy in the left breast.  The patient's subsequent history is as detailed below.   PAST MEDICAL HISTORY: History reviewed. No pertinent past medical history.  PAST SURGICAL HISTORY: Past Surgical History:  Procedure Laterality Date  . IR IMAGING GUIDED PORT INSERTION  05/24/2019    FAMILY HISTORY: Family History  Problem Relation Age of Onset  . Breast cancer Mother   . Breast cancer Maternal Aunt    Patient's father is 68 and her mother 34 as of November 2020.  The patient's mother was diagnosed with breast cancer in her early 31s.  She lives in [place]. The patient's mother's sister was also diagnosed with breast cancer in her early 60s.  The patient herself has 1 sister, no brothers.  She is not aware of any ovarian cancer cases in the family.   GYNECOLOGIC HISTORY:  Patient's last menstrual period was 05/24/2019. Menarche: 28 years old Age at first live birth: 28 years old [place] P 2 LMPregular Contraceptive HRT n/a  Hysterectomy? no  BSO? no   SOCIAL HISTORY: (updated 04/2019)  Brianna Owens is currently working at [employer].  She is originally from [place].  She is divorced.  Her  children are Brianna Owens, 42 years old, living in [place] with his father, and Brianna Owens, 39 years old, who lives with the patient.  Also at home are her uncle [name], his wife, and that wife's cousin.     ADVANCED DIRECTIVES: Not in place   HEALTH MAINTENANCE: Social History   Tobacco Use  . Smoking status: Never Smoker  . Smokeless tobacco: Never Used  Substance Use Topics  . Alcohol use: Not Currently  . Drug use: Not Currently     Colonoscopy: n/a  PAP: 04/26/2019, negative  Bone density: n/a   No Known Allergies  Current Outpatient Medications  Medication Sig Dispense Refill  . dexamethasone (DECADRON) 4 MG tablet Take 2 tablets by mouth once a day on the day after chemotherapy and then take 2 tablets two times a day for 2 days. Take with food. 30 tablet 1  . lidocaine-prilocaine (EMLA) cream Apply to affected area once 30 g 3  . loratadine (CLARITIN) 10 MG tablet Take 1 tablet (10 mg total) by mouth daily. 60 tablet 0  . prochlorperazine (COMPAZINE) 10 MG tablet Take 1 tablet (10 mg total) by mouth every 6 (six) hours as needed (Nausea or vomiting). 30 tablet 1  . venlafaxine XR (EFFEXOR-XR) 75 MG 24 hr capsule Take 1 capsule (75 mg total) by mouth daily with breakfast. 30 capsule 4   No current facility-administered medications for this visit.    OBJECTIVE:   Vitals:   06/09/19 1131  BP: 121/75  Pulse: 87  Resp: 20  Temp: 98.3 F (36.8 C)  SpO2: 100%     Body mass index is 26.16 kg/m.   Wt Readings from Last 3 Encounters:  06/09/19 157 lb 3.2 oz (71.3 kg)  06/03/19 153 lb (69.4 kg)  05/26/19 154 lb 8 oz (70.1 kg)      ECOG FS:1 - Symptomatic but completely ambulatory GENERAL: Patient is a well appearing female in no acute distress HEENT:  Sclerae anicteric.  Mask in place. Neck is supple.  NODES:  No cervical, supraclavicular, or axillary lymphadenopathy palpated.  BREAST EXAM: Breast mass in right lower central breast about 5 cm, palpable LUNGS:  Clear to  auscultation bilaterally.  No wheezes or rhonchi. HEART:  Regular rate and rhythm. No murmur appreciated. ABDOMEN:  Soft, nontender.  Positive, normoactive bowel sounds. No organomegaly palpated. MSK:  No focal spinal tenderness to palpation. Full range of motion bilaterally in the upper extremities. EXTREMITIES:  No peripheral edema.   SKIN:  Clear with no obvious rashes or skin changes. No nail dyscrasia. NEURO:  Nonfocal. Well oriented.  Appropriate affect.    LAB RESULTS:  CMP     Component Value Date/Time   NA 141 06/09/2019 1120   K 4.1 06/09/2019 1120   CL 107 06/09/2019 1120   CO2 26 06/09/2019 1120   GLUCOSE 102 (H) 06/09/2019 1120   BUN 7 06/09/2019 1120   CREATININE 0.62 06/09/2019 1120   CALCIUM 9.0 06/09/2019 1120   PROT 7.5 06/09/2019 1120   ALBUMIN 3.9 06/09/2019 1120   AST 36 06/09/2019 1120   ALT 81 (H) 06/09/2019 1120   ALKPHOS 124 06/09/2019 1120   BILITOT <0.2 (L) 06/09/2019 1120   GFRNONAA >60 06/09/2019 1120   GFRAA >60 06/09/2019 1120    No results found for: TOTALPROTELP, ALBUMINELP, A1GS, A2GS,  BETS, BETA2SER, GAMS, MSPIKE, SPEI  No results found for: KPAFRELGTCHN, LAMBDASER, KAPLAMBRATIO  Lab Results  Component Value Date   WBC 14.5 (H) 06/09/2019   NEUTROABS PENDING 06/09/2019   HGB 10.7 (L) 06/09/2019   HCT 32.0 (L) 06/09/2019   MCV 91.2 06/09/2019   PLT 164 06/09/2019    No results found for: LABCA2  No components found for: STMHDQ222  No results for input(s): INR in the last 168 hours.  No results found for: LABCA2  No results found for: LNL892  No results found for: JJH417  No results found for: EYC144  No results found for: CA2729  No components found for: HGQUANT  No results found for: CEA1 / No results found for: CEA1   No results found for: AFPTUMOR  No results found for: CHROMOGRNA  No results found for: PSA1  Appointment on 06/09/2019  Component Date Value Ref Range Status  . Preg Test, Ur 06/09/2019  NEGATIVE  NEGATIVE Final   Comment:        THE SENSITIVITY OF THIS METHODOLOGY IS >20 mIU/mL. Performed at [facility], [place] [address], [place], [place] [address]   . Sodium 06/09/2019 141  135 - 145 mmol/L Final  . Potassium 06/09/2019 4.1  3.5 - 5.1 mmol/L Final  . Chloride 06/09/2019 107  98 - 111 mmol/L Final  . CO2 06/09/2019 26  22 - 32 mmol/L Final  . Glucose, Bld 06/09/2019 102* 70 - 99 mg/dL Final  . BUN 06/09/2019 7  6 - 20 mg/dL Final  . Creatinine, Ser 06/09/2019 0.62  0.44 - 1.00 mg/dL Final  . Calcium 06/09/2019 9.0  8.9 - 10.3 mg/dL Final  . Total Protein 06/09/2019 7.5  6.5 - 8.1 g/dL Final  . Albumin 06/09/2019 3.9  3.5 - 5.0 g/dL Final  . AST 06/09/2019 36  15 - 41 U/L Final  . ALT 06/09/2019 81* 0 - 44 U/L Final  . Alkaline Phosphatase 06/09/2019 124  38 - 126 U/L Final  . Total Bilirubin 06/09/2019 <0.2* 0.3 - 1.2 mg/dL Final  . GFR calc non Af Amer 06/09/2019 >60  >60 mL/min Final  . GFR calc Af Amer 06/09/2019 >60  >60 mL/min Final  . Anion gap 06/09/2019 8  5 - 15 Final   Performed at [facility], [place] [address] [place], [place] [address]  . WBC 06/09/2019 14.5* 4.0 - 10.5 K/uL Final  . RBC 06/09/2019 3.51* 3.87 - 5.11 MIL/uL Final  . Hemoglobin 06/09/2019 10.7* 12.0 - 15.0 g/dL Final  . HCT 06/09/2019 32.0* 36.0 - 46.0 % Final  . MCV 06/09/2019 91.2  80.0 - 100.0 fL Final  . MCH 06/09/2019 30.5  26.0 - 34.0 pg Final  . MCHC 06/09/2019 33.4  30.0 - 36.0 g/dL Final  . RDW 06/09/2019 11.6  11.5 - 15.5 % Final  . Platelets 06/09/2019 164  150 - 400 K/uL Final  . nRBC 06/09/2019 0.2  0.0 - 0.2 % Final   Performed at [facility], [place] [address], [place], [place] [address]  . Neutrophils Relative % 06/09/2019 PENDING  % Incomplete  . Neutro Abs 06/09/2019 PENDING  1.7 - 7.7 K/uL Incomplete  . Band Neutrophils 06/09/2019 PENDING  % Incomplete  . Lymphocytes Relative 06/09/2019 PENDING  %  Incomplete  . Lymphs Abs 06/09/2019 PENDING  0.7 - 4.0 K/uL Incomplete  . Monocytes Relative 06/09/2019 PENDING  % Incomplete  . Monocytes Absolute 06/09/2019 PENDING  0.1 -  1.0 K/uL Incomplete  . Eosinophils Relative 06/09/2019 PENDING  % Incomplete  . Eosinophils Absolute 06/09/2019 PENDING  0.0 - 0.5 K/uL Incomplete  . Basophils Relative 06/09/2019 PENDING  % Incomplete  . Basophils Absolute 06/09/2019 PENDING  0.0 - 0.1 K/uL Incomplete  . WBC Morphology 06/09/2019 PENDING   Incomplete  . RBC Morphology 06/09/2019 PENDING   Incomplete  . Smear Review 06/09/2019 PENDING   Incomplete  . Other 06/09/2019 PENDING  % Incomplete  . nRBC 06/09/2019 PENDING  0 /100 WBC Incomplete  . Metamyelocytes Relative 06/09/2019 PENDING  % Incomplete  . Myelocytes 06/09/2019 PENDING  % Incomplete  . Promyelocytes Relative 06/09/2019 PENDING  % Incomplete  . Blasts 06/09/2019 PENDING  % Incomplete  . Immature Granulocytes 06/09/2019 PENDING  % Incomplete  . Abs Immature Granulocytes 06/09/2019 PENDING  0.00 - 0.07 K/uL Incomplete    (this displays the last labs from the last 3 days)  No results found for: TOTALPROTELP, ALBUMINELP, A1GS, A2GS, BETS, BETA2SER, GAMS, MSPIKE, SPEI (this displays SPEP labs)  No results found for: KPAFRELGTCHN, LAMBDASER, KAPLAMBRATIO (kappa/lambda light chains)  No results found for: HGBA, HGBA2QUANT, HGBFQUANT, HGBSQUAN (Hemoglobinopathy evaluation)   No results found for: LDH  No results found for: IRON, TIBC, IRONPCTSAT (Iron and TIBC)  No results found for: FERRITIN  Urinalysis No results found for: COLORURINE, APPEARANCEUR, LABSPEC, PHURINE, GLUCOSEU, HGBUR, BILIRUBINUR, KETONESUR, PROTEINUR, UROBILINOGEN, NITRITE, LEUKOCYTESUR   STUDIES: CT Chest W Contrast  Result Date: 05/25/2019 CLINICAL DATA:  Breast cancer, staging. EXAM: CT CHEST WITH CONTRAST TECHNIQUE: Multidetector CT imaging of the chest was performed during intravenous contrast administration.  CONTRAST:  51m OMNIPAQUE IOHEXOL 300 MG/ML  SOLN COMPARISON:  None. FINDINGS: Cardiovascular: Left IJ Port-A-Cath terminates in the right atrium. Vascular structures are otherwise unremarkable. Heart is mildly enlarged and left ventricle appears dilated. No pericardial effusion. Mediastinum/Nodes: Partially imaged submental lymph nodes measure up to 10 mm (2/4). Prevascular lymph nodes measure up to 1.4 cm. No additional pathologically enlarged mediastinal or hilar lymph nodes. Enlarged right axillary lymph nodes measure up to 1.5 cm. Right subpectoral lymph nodes measure up to 11 mm (2/31). Left axillary lymph nodes are not enlarged by CT size criteria. Esophagus is unremarkable. Lungs/Pleura: 3 mm subpleural medial right middle lobe nodule (5/91), nonspecific and likely benign. Lungs are otherwise clear. No pleural fluid. Airway is unremarkable. Upper Abdomen: Liver may be slightly decreased in attenuation diffusely. Visualized portions of the liver, adrenal glands, kidneys, spleen, pancreas, stomach and bowel are otherwise grossly unremarkable. Musculoskeletal: Hyperattenuating mass in the right breast measures 5.6 x 7.0 cm. No worrisome lytic or sclerotic lesions. IMPRESSION: 1. Large right breast mass with metastatic right axillary, right subpectoral and prevascular adenopathy. 2. Borderline enlarged submental lymph nodes, incompletely imaged. 3. Left ventricle may be somewhat dilated. 4. Liver may be steatotic. Electronically Signed   By: MLorin PicketM.D.   On: 05/25/2019 10:29   NM Bone Scan Whole Body  Result Date: 05/25/2019 CLINICAL DATA:  EOV 19.6 MCI TC99 MDP LAC '@0905' /CS DX: R BREST CA, METS SUSPECTED PT STATES NO PAIN, NO BONE SX, NO FALLS, NO FX^19.679mlicurie TC-MDP TECHNETIUM TC 64M MEDRONATE IV KITBreast cancer, mets suspected, staging EXAM: NUCLEAR MEDICINE WHOLE BODY BONE SCAN TECHNIQUE: Whole body anterior and posterior images were obtained approximately 3 hours after intravenous  injection of radiopharmaceutical. RADIOPHARMACEUTICALS:  19.6 mCi Technetium-9941mP IV COMPARISON:  CT 05/25/2019 FINDINGS: No abnormal radiotracer accumulation within the axillary or appendicular skeleton to suggest metastatic disease. Sacrum partially obscured  by bladder activity. IMPRESSION: No scintigraphic evidence skeletal metastasis. Electronically Signed   By: Suzy Bouchard M.D.   On: 05/25/2019 16:44   MR BREAST BILATERAL W WO CONTRAST INC CAD  Result Date: 05/18/2019 CLINICAL DATA:  28 year old female with newly diagnosed invasive ductal carcinoma of the right breast. An abnormal right axillary lymph node was also biopsied. This was negative for metastatic disease, but consider discordant. Strong family history of breast cancer in mother and aunt diagnosed in their 22s. LABS:  None performed today on site. EXAM: BILATERAL BREAST MRI WITH AND WITHOUT CONTRAST TECHNIQUE: Multiplanar, multisequence MR images of both breasts were obtained prior to and following the intravenous administration of 7 ml of Gadavist. Three-dimensional MR images were rendered by post-processing of the original MR data on an independent workstation. The three-dimensional MR images were interpreted, and findings are reported in the following complete MRI report for this study. Three dimensional images were evaluated at the independent DynaCad workstation COMPARISON:  Previous exam(s). FINDINGS: Breast composition: d. Extreme fibroglandular tissue. Background parenchymal enhancement: Moderate. Right breast: There is an oval, circumscribed rapidly enhancing mass in the central right breast consistent with the patient's biopsy-proven malignancy. It measures 7.1 x 5.6 x 7.0 cm (transverse by AP by craniocaudal dimensions). No additional suspicious enhancing masses or non mass enhancement is identified within the right breast. There is no involvement of the overlying skin or underlying pectoralis muscle. Left breast: No suspicious  mass or abnormal enhancement. Lymph nodes: At least 7 morphologically abnormal, suspicious level 1 right axillary lymph nodes are noted (series 5, images 25-62 of 144). Additionally, at least 3 prominent level 3 lymph nodes are partially visualized along the upper limits of the study (series 5 images 25-29 of 144). These are asymmetric from the contralateral side and suspicious for involvement of disease. A suspicious 7 x 10 mm right internal mammary chain lymph node is identified centrally (series 5, image 86 of 144). A higher 9 mm internal mammary chain node is also asymmetric and suspicious (series 5, image 43/144). Ancillary findings:  None. IMPRESSION: 1. 7.1 cm right breast mass consistent with the patient's biopsy-proven malignancy. No other suspicious findings within the right breast. 2. At least seven morphologically abnormal level I right axillary lymph nodes. Three suspicious level III lymph nodes partially visualized along the superior limits of the study. 3. Two suspicious 9-10 mm right internal mammary chain lymph nodes. 4. No MRI evidence of malignancy on the left. RECOMMENDATION: Per clinical treatment plan. BI-RADS CATEGORY  6: Known biopsy-proven malignancy. Electronically Signed   By: Kristopher Oppenheim M.D.   On: 05/18/2019 14:30   ECHOCARDIOGRAM COMPLETE  Result Date: 05/25/2019   ECHOCARDIOGRAM REPORT   Patient Name:   Brianna Owens [facility] Date of Exam: 05/25/2019 Medical Rec #:  [id number]                   Height:       65.0 in Accession #:    [id number]                  Weight:       157.8 lb Date of Birth:  [birth year]                   BSA:          1.79 m Patient Age:    28 years                    BP:  115/70 mmHg Patient Gender: F                           HR:           68 bpm. Exam Location:  Outpatient Procedure: 2D Echo, Cardiac Doppler and Color Doppler Indications:    Chemo evaluation  History:        Patient has no prior history of Echocardiogram examinations.                  Breast cancer, chemo.  Sonographer:    Dustin Flock Referring Phys: [place]  1. Left ventricular ejection fraction, by visual estimation, is 55 to 60%. The left ventricle has normal function. There is no left ventricular hypertrophy.  2. The left ventricle has no regional wall motion abnormalities.  3. GLS underestimated due to poor endocardial tracking.  4. Global right ventricle has normal systolic function.The right ventricular size is normal. No increase in right ventricular wall thickness.  5. Left atrial size was normal.  6. Right atrial size was normal.  7. Trivial pericardial effusion is present.  8. The mitral valve is normal in structure. No evidence of mitral valve regurgitation.  9. The tricuspid valve is normal in structure. Tricuspid valve regurgitation is trivial. 10. The aortic valve is normal in structure. Aortic valve regurgitation is not visualized. 11. The pulmonic valve was normal in structure. Pulmonic valve regurgitation is not visualized. 12. The average left ventricular global longitudinal strain is -13.8 %. FINDINGS  Left Ventricle: Left ventricular ejection fraction, by visual estimation, is 55 to 60%. The left ventricle has normal function. The average left ventricular global longitudinal strain is -13.8 %. The left ventricle has no regional wall motion abnormalities. There is no left ventricular hypertrophy. Left ventricular diastolic parameters were normal. GLS underestimated due to poor endocardial tracking. Right Ventricle: The right ventricular size is normal. No increase in right ventricular wall thickness. Global RV systolic function is has normal systolic function. Left Atrium: Left atrial size was normal in size. Right Atrium: Right atrial size was normal in size Pericardium: Trivial pericardial effusion is present. Mitral Valve: The mitral valve is normal in structure. No evidence of mitral valve regurgitation. Tricuspid Valve: The  tricuspid valve is normal in structure. Tricuspid valve regurgitation is trivial. Aortic Valve: The aortic valve is normal in structure. Aortic valve regurgitation is not visualized. Pulmonic Valve: The pulmonic valve was normal in structure. Pulmonic valve regurgitation is not visualized. Pulmonic regurgitation is not visualized. Aorta: The aortic root and ascending aorta are structurally normal, with no evidence of dilitation. IAS/Shunts: No atrial level shunt detected by color flow Doppler.  LEFT VENTRICLE PLAX 2D LVIDd:         4.50 cm  Diastology LVIDs:         3.20 cm  LV e' lateral:   10.30 cm/s LV PW:         0.90 cm  LV E/e' lateral: 6.9 LV IVS:        0.90 cm  LV e' medial:    8.38 cm/s LVOT diam:     2.10 cm  LV E/e' medial:  8.4 LV SV:         51 ml LV SV Index:   28.19    2D Longitudinal Strain LVOT Area:     3.46 cm 2D Strain GLS Avg:     -13.8 %  RIGHT VENTRICLE RV Basal  diam:  2.20 cm RV S prime:     12.00 cm/s TAPSE (M-mode): 2.5 cm LEFT ATRIUM             Index       RIGHT ATRIUM          Index LA diam:        2.90 cm 1.62 cm/m  RA Area:     9.97 cm LA Vol (A2C):   39.9 ml 22.30 ml/m RA Volume:   20.40 ml 11.40 ml/m LA Vol (A4C):   45.2 ml 25.27 ml/m LA Biplane Vol: 44.2 ml 24.71 ml/m  AORTIC VALVE LVOT Vmax:   95.30 cm/s LVOT Vmean:  52.400 cm/s LVOT VTI:    0.139 m  AORTA Ao Root diam: 2.70 cm MITRAL VALVE MV Area (PHT): 6.83 cm            SHUNTS MV PHT:        32.19 msec          Systemic VTI:  0.14 m MV Decel Time: 111 msec            Systemic Diam: 2.10 cm MV E velocity: 70.70 cm/s 103 cm/s  Glori Bickers MD Electronically signed by Glori Bickers MD Signature Date/Time: 05/25/2019/1:56:36 PM    Final    IR IMAGING GUIDED PORT INSERTION  Result Date: 05/24/2019 INDICATION: History of right-sided breast cancer. In need of durable intravenous access for chemotherapy administration EXAM: IMPLANTED PORT A CATH PLACEMENT WITH ULTRASOUND AND FLUOROSCOPIC GUIDANCE COMPARISON:  None.  MEDICATIONS: Ancef 2 gm IV; The antibiotic was administered within an appropriate time interval prior to skin puncture. ANESTHESIA/SEDATION: Moderate (conscious) sedation was employed during this procedure. A total of Versed 1.5 mg and Fentanyl 75 mcg was administered intravenously. Moderate Sedation Time: 19 minutes. The patient's level of consciousness and vital signs were monitored continuously by radiology nursing throughout the procedure under my direct supervision. CONTRAST:  None FLUOROSCOPY TIME:  42 seconds (8 mGy) COMPLICATIONS: None immediate. PROCEDURE: The procedure, risks, benefits, and alternatives were explained to the patient via the use of a medical translator. Questions regarding the procedure were encouraged and answered. The patient understands and consents to the procedure. Given the presence of the right-sided breast cancer decision was made to place a left internal jugular approach port a catheter. As such, the left neck and chest were prepped with chlorhexidine in a sterile fashion, and a sterile drape was applied covering the operative field. Maximum barrier sterile technique with sterile gowns and gloves were used for the procedure. A timeout was performed prior to the initiation of the procedure. Local anesthesia was provided with 1% lidocaine with epinephrine. After creating a small venotomy incision, a micropuncture kit was utilized to access the internal jugular vein. Real-time ultrasound guidance was utilized for vascular access including the acquisition of a permanent ultrasound image documenting patency of the accessed vessel. The microwire was utilized to measure appropriate catheter length. A subcutaneous port pocket was then created along the upper chest wall utilizing a combination of sharp and blunt dissection. The pocket was irrigated with sterile saline. A single lumen ISP power injectable port was chosen for placement. The 8 Fr catheter was tunneled from the port pocket site  to the venotomy incision. The port was placed in the pocket. The external catheter was trimmed to appropriate length. At the venotomy, an 8 Fr peel-away sheath was placed over a guidewire under fluoroscopic guidance. The catheter was then placed through the sheath and  the sheath was removed. Final catheter positioning was confirmed and documented with a fluoroscopic spot radiograph. The port was accessed with a Huber needle, aspirated and flushed with heparinized saline. The venotomy site was closed with an interrupted 4-0 Vicryl suture. The port pocket incision was closed with interrupted 2-0 Vicryl suture and the skin was opposed with a running subcuticular 4-0 Vicryl suture. Dermabond and Steri-strips were applied to both incisions. Dressings were placed. The patient tolerated the procedure well without immediate post procedural complication. FINDINGS: After catheter placement, the tip lies within the superior cavoatrial junction. The catheter aspirates and flushes normally and is ready for immediate use. IMPRESSION: Successful placement of a left internal jugular approach power injectable Port-A-Cath. The catheter is ready for immediate use. Electronically Signed   By: Sandi Mariscal M.D.   On: 05/24/2019 13:00    ELIGIBLE FOR AVAILABLE RESEARCH PROTOCOL:no  ASSESSMENT: 28 y.o. BRCA1 positive [name] woman status post right breast overlapping sites biopsy 05/04/2019 for a clinical T3 N0, stage IIIB invasive ductal carcinoma, grade 2, triple negative, with an MIB-1 of 40%.  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  The Common Hereditary Gene Panel offered by [facility] includes sequencing and/or deletion duplication testing of the following 48 genes: APC, ATM, AXIN2, BARD1, BMPR1A, BRCA1, BRCA2, BRIP1, CDH1, CDK4,  CDKN2A (p14ARF), CDKN2A (p16INK4a), CHEK2, CTNNA1, DICER1, EPCAM (Deletion/duplication testing only), GREM1 (promoter region deletion/duplication testing only), KIT, MEN1, MLH1, MSH2, MSH3, MSH6, MUTYH, NBN, NF1, NHTL1, PALB2, PDGFRA, PMS2, POLD1, POLE, PTEN, RAD50, RAD51C, RAD51D, RNF43, SDHB, SDHC, SDHD, SMAD4, SMARCA4. STK11, TP53, TSC1, TSC2, and VHL.  The following genes were evaluated for sequence changes only: SDHA and HOXB13 c.251G>A variant only. The report date is 05/24/2019.  (2) neoadjuvant chemotherapy consisting of doxorubicin and cyclophosphamide in dose dense fashion x4 started 05/26/2019 to be followed by paclitaxel and carboplatin weekly x12  (3) definitive surgery to follow  (4) adjuvant radiation to follow   PLAN: Kaleigh is doing well today.  She is ready to proceed with her second cycle of neoajduvant chemotherapy with doxorubicin and cyclophosphamide.  She is tolerating this well.  I reviewed her cbc with her in detail which is stable.  CMET is pending.    Ladaija has her anti nausea medications and understands how to take them.  She understands when her f/u appointments are scheduled, and knows she needs to return on Saturday for her injection appointment.    I let Carliyah know that I am adding flush appointments to her lab appointments, that way she can have her port accessed and labs drawn through there on her treatment days.  She understands this.  We are not seeing her back next week for f/u, however she understands that if she needs anything to please call us and reach out as we are happy to see her if needed.  She was recommended to continue with the appropriate pandemic precautions. She knows to call for any questions that may arise between now and her next appointment.  We are happy to see her sooner if needed.  A total of (15) minutes of face-to-face time was spent with this patient with greater than 50% of that time in counseling and  care-coordination.   Wilber Bihari, NP   06/09/2019 12:15 PM Medical Oncology and Hematology [facility] [place],  [place] [address] Tel. [phone]    Fax. [phone]

## 2019-06-09 NOTE — Patient Instructions (Signed)
[facility] Discharge Instructions for Patients Receiving Chemotherapy  Today you received the following chemotherapy agents Adriamycin, Cytoxan.  To help prevent nausea and vomiting after your treatment, we encourage you to take your nausea medication as prescribed.   If you develop nausea and vomiting that is not controlled by your nausea medication, call the clinic.   BELOW ARE SYMPTOMS THAT SHOULD BE REPORTED IMMEDIATELY:  *FEVER GREATER THAN 100.5 F  *CHILLS WITH OR WITHOUT FEVER  NAUSEA AND VOMITING THAT IS NOT CONTROLLED WITH YOUR NAUSEA MEDICATION  *UNUSUAL SHORTNESS OF BREATH  *UNUSUAL BRUISING OR BLEEDING  TENDERNESS IN MOUTH AND THROAT WITH OR WITHOUT PRESENCE OF ULCERS  *URINARY PROBLEMS  *BOWEL PROBLEMS  UNUSUAL RASH Items with * indicate a potential emergency and should be followed up as soon as possible.  Feel free to call the clinic should you have any questions or concerns. The clinic phone number is [phone].  Please show the CHEMO ALERT CARD at check-in to the Emergency Department and triage nurse.   

## 2019-06-11 ENCOUNTER — Other Ambulatory Visit: Payer: Self-pay

## 2019-06-11 ENCOUNTER — Inpatient Hospital Stay: Payer: No Typology Code available for payment source

## 2019-06-11 VITALS — BP 128/72 | HR 78 | Temp 98.2°F | Resp 18

## 2019-06-11 DIAGNOSIS — Z171 Estrogen receptor negative status [ER-]: Secondary | ICD-10-CM

## 2019-06-11 DIAGNOSIS — C50811 Malignant neoplasm of overlapping sites of right female breast: Secondary | ICD-10-CM

## 2019-06-11 MED ORDER — PEGFILGRASTIM-CBQV 6 MG/0.6ML ~~LOC~~ SOSY
PREFILLED_SYRINGE | SUBCUTANEOUS | Status: AC
  Filled 2019-06-11: qty 0.6

## 2019-06-11 MED ORDER — PEGFILGRASTIM-CBQV 6 MG/0.6ML ~~LOC~~ SOSY
6.0000 mg | PREFILLED_SYRINGE | Freq: Once | SUBCUTANEOUS | Status: AC
  Administered 2019-06-11: 6 mg via SUBCUTANEOUS

## 2019-06-11 NOTE — Patient Instructions (Signed)

## 2019-06-13 ENCOUNTER — Encounter: Payer: Self-pay | Admitting: Pharmacy Technician

## 2019-06-13 NOTE — Progress Notes (Addendum)
The patient is approved for drug assistance by [employer] for [employer].  Enrollment is effective until 06/11/20 and is based on self-pay.  Drug replacement will begin on DOS 05/28/19 and 06/11/19

## 2019-06-21 ENCOUNTER — Encounter: Payer: Self-pay | Admitting: *Deleted

## 2019-06-23 ENCOUNTER — Encounter: Payer: Self-pay | Admitting: *Deleted

## 2019-06-23 ENCOUNTER — Inpatient Hospital Stay (HOSPITAL_BASED_OUTPATIENT_CLINIC_OR_DEPARTMENT_OTHER): Payer: Self-pay | Admitting: Adult Health

## 2019-06-23 ENCOUNTER — Inpatient Hospital Stay: Payer: No Typology Code available for payment source

## 2019-06-23 ENCOUNTER — Inpatient Hospital Stay: Payer: No Typology Code available for payment source | Attending: Oncology

## 2019-06-23 ENCOUNTER — Encounter: Payer: Self-pay | Admitting: Adult Health

## 2019-06-23 ENCOUNTER — Encounter: Payer: Self-pay | Admitting: Oncology

## 2019-06-23 ENCOUNTER — Other Ambulatory Visit: Payer: Self-pay

## 2019-06-23 VITALS — BP 118/75 | HR 72 | Temp 97.9°F | Resp 18 | Ht 65.0 in | Wt 158.3 lb

## 2019-06-23 DIAGNOSIS — Z95828 Presence of other vascular implants and grafts: Secondary | ICD-10-CM

## 2019-06-23 DIAGNOSIS — Z7952 Long term (current) use of systemic steroids: Secondary | ICD-10-CM | POA: Insufficient documentation

## 2019-06-23 DIAGNOSIS — Z1501 Genetic susceptibility to malignant neoplasm of breast: Secondary | ICD-10-CM | POA: Insufficient documentation

## 2019-06-23 DIAGNOSIS — Z803 Family history of malignant neoplasm of breast: Secondary | ICD-10-CM | POA: Insufficient documentation

## 2019-06-23 DIAGNOSIS — Z171 Estrogen receptor negative status [ER-]: Secondary | ICD-10-CM | POA: Insufficient documentation

## 2019-06-23 DIAGNOSIS — C50811 Malignant neoplasm of overlapping sites of right female breast: Secondary | ICD-10-CM

## 2019-06-23 DIAGNOSIS — Z79899 Other long term (current) drug therapy: Secondary | ICD-10-CM | POA: Insufficient documentation

## 2019-06-23 DIAGNOSIS — Z5111 Encounter for antineoplastic chemotherapy: Secondary | ICD-10-CM | POA: Insufficient documentation

## 2019-06-23 DIAGNOSIS — Z1502 Genetic susceptibility to malignant neoplasm of ovary: Secondary | ICD-10-CM

## 2019-06-23 LAB — COMPREHENSIVE METABOLIC PANEL
ALT: 87 U/L — ABNORMAL HIGH (ref 0–44)
AST: 33 U/L (ref 15–41)
Albumin: 4.1 g/dL (ref 3.5–5.0)
Alkaline Phosphatase: 101 U/L (ref 38–126)
Anion gap: 7 (ref 5–15)
BUN: 12 mg/dL (ref 6–20)
CO2: 26 mmol/L (ref 22–32)
Calcium: 8.7 mg/dL — ABNORMAL LOW (ref 8.9–10.3)
Chloride: 106 mmol/L (ref 98–111)
Creatinine, Ser: 0.61 mg/dL (ref 0.44–1.00)
GFR calc Af Amer: >60 mL/min (ref >60)
GFR calc non Af Amer: >60 mL/min (ref >60)
Glucose, Bld: 102 mg/dL — ABNORMAL HIGH (ref 70–99)
Potassium: 4 mmol/L (ref 3.5–5.1)
Sodium: 139 mmol/L (ref 135–145)
Total Bilirubin: <0.2 mg/dL — ABNORMAL LOW (ref 0.3–1.2)
Total Protein: 7.3 g/dL (ref 6.5–8.1)

## 2019-06-23 LAB — CBC WITH DIFFERENTIAL/PLATELET
Abs Immature Granulocytes: 1.26 10*3/uL — ABNORMAL HIGH (ref 0.00–0.07)
Basophils Absolute: 0.1 10*3/uL (ref 0.0–0.1)
Basophils Relative: 1 %
Eosinophils Absolute: 0.1 10*3/uL (ref 0.0–0.5)
Eosinophils Relative: 1 %
HCT: 28.6 % — ABNORMAL LOW (ref 36.0–46.0)
Hemoglobin: 9.8 g/dL — ABNORMAL LOW (ref 12.0–15.0)
Immature Granulocytes: 17 %
Lymphocytes Relative: 18 %
Lymphs Abs: 1.4 10*3/uL (ref 0.7–4.0)
MCH: 30.9 pg (ref 26.0–34.0)
MCHC: 34.3 g/dL (ref 30.0–36.0)
MCV: 90.2 fL (ref 80.0–100.0)
Monocytes Absolute: 1 10*3/uL (ref 0.1–1.0)
Monocytes Relative: 14 %
Neutro Abs: 3.7 10*3/uL (ref 1.7–7.7)
Neutrophils Relative %: 49 %
Platelets: 218 10*3/uL (ref 150–400)
RBC: 3.17 MIL/uL — ABNORMAL LOW (ref 3.87–5.11)
RDW: 11.9 % (ref 11.5–15.5)
WBC: 7.5 10*3/uL (ref 4.0–10.5)
nRBC: 0.7 % — ABNORMAL HIGH (ref 0.0–0.2)

## 2019-06-23 LAB — PREGNANCY, URINE: Preg Test, Ur: NEGATIVE

## 2019-06-23 MED ORDER — SODIUM CHLORIDE 0.9 % IV SOLN
Freq: Once | INTRAVENOUS | Status: AC
  Filled 2019-06-23: qty 5

## 2019-06-23 MED ORDER — PALONOSETRON HCL INJECTION 0.25 MG/5ML
0.2500 mg | Freq: Once | INTRAVENOUS | Status: AC
  Administered 2019-06-23: 0.25 mg via INTRAVENOUS

## 2019-06-23 MED ORDER — DOXORUBICIN HCL CHEMO IV INJECTION 2 MG/ML
60.0000 mg/m2 | Freq: Once | INTRAVENOUS | Status: AC
  Administered 2019-06-23: 108 mg via INTRAVENOUS
  Filled 2019-06-23: qty 54

## 2019-06-23 MED ORDER — SODIUM CHLORIDE 0.9 % IV SOLN
Freq: Once | INTRAVENOUS | Status: AC
  Filled 2019-06-23: qty 250

## 2019-06-23 MED ORDER — HEPARIN SOD (PORK) LOCK FLUSH 100 UNIT/ML IV SOLN
500.0000 [IU] | Freq: Once | INTRAVENOUS | Status: AC | PRN
  Administered 2019-06-23: 500 [IU]
  Filled 2019-06-23: qty 5

## 2019-06-23 MED ORDER — SODIUM CHLORIDE 0.9% FLUSH
10.0000 mL | INTRAVENOUS | Status: DC | PRN
  Administered 2019-06-23: 10 mL via INTRAVENOUS
  Filled 2019-06-23: qty 10

## 2019-06-23 MED ORDER — SODIUM CHLORIDE 0.9 % IV SOLN
600.0000 mg/m2 | Freq: Once | INTRAVENOUS | Status: AC
  Administered 2019-06-23: 13:00:00 1080 mg via INTRAVENOUS
  Filled 2019-06-23: qty 54

## 2019-06-23 MED ORDER — PALONOSETRON HCL INJECTION 0.25 MG/5ML
INTRAVENOUS | Status: AC
  Filled 2019-06-23: qty 5

## 2019-06-23 MED ORDER — SODIUM CHLORIDE 0.9% FLUSH
10.0000 mL | INTRAVENOUS | Status: DC | PRN
  Administered 2019-06-23: 10 mL
  Filled 2019-06-23: qty 10

## 2019-06-23 NOTE — Progress Notes (Signed)
[facility]  [phone] [phone]     ID: Brianna Owens DOB: [birth year]  MR#: [id number]  HFS#:[id number]  Patient Care Team: Patient, [name] as PCP - General (General Practice) Magrinat, Brianna Dad, MD as Consulting Physician (Oncology) Brianna Luna, MD as Consulting Physician (General Surgery) Brianna Kaufmann, RN as Oncology Nurse Navigator Brianna Germany, RN as Oncology Nurse Navigator Brianna Dock, NP OTHER MD:  CHIEF COMPLAINT: Triple negative breast cancer, BRCA1+  CURRENT TREATMENT: neoadjuvant chemotherapy   INTERVAL HISTORY: Brianna Owens returns today for follow up and treatment of her triple negative breast cancer.  She is accompanied by spanish interpreter Brianna Owens.  She is receiving neoadjuvant chemotherapy consisting of doxorubicin and cyclophosphamide in dose dense fashion x4 with Neulasta support with on day 3.  Today is cycle 3 day 1 of therapy.    Her most recent echocardiogram on 05/25/2019 showed an ejection fraction of 55-60%.   REVIEW OF SYSTEMS: Brianna Owens is doing well today.  She denies any issues, and says that she is feeling well.  She notes no side effect from chemotherapy, however when I asked her what she does all day, she said that she lies down all day.  She notes her tumor is shrinking.    Brianna Owens denies fever, chills, chest pain, palpitations, cough, shortness of breath, bowel/bladder changes, headaches, vision issues, nausea, vomiting, dysphagia, or any other concerns.  A detailed ROS was otherwise non contributory.     HISTORY OF CURRENT ILLNESS: From the original intake note:  Brianna Owens [facility] with a 3 month history of a right breast lump that became painful in early 04/2019. Physical exam performed at that time showed a palpable, tender 13 cm lump within the right center breast under the nipple area. She underwent right  breast ultrasonography at [facility] on 04/27/2019 showing: 6.1 cm palpable mass centered in the 5 o'clock position of the right breast; single right inferomedial axillary lymph node with focal cortical thickening inferiorly.  Accordingly on 05/04/2019 she proceeded to biopsy of the right breast area in question. The pathology from this procedure ([id number]) showed: invasive ductal carcinoma, grade 2-3. Prognostic indicators significant for: estrogen receptor, 0% negative and progesterone receptor, 0% negative. Proliferation marker Ki67 at 40%.  I do not find HER-2 receptor documentation  The right axillary lymph node biopsied at that time showed reactive germinal centers.  She underwent bilateral diagnostic mammography with tomography at [facility] on 05/05/2019 showing: breast density category D; biopsy-proven right breast malignancy measures 6.1 cm; no additional masses or calcifications seen in the right breast; no evidence of malignancy in the left breast.  The patient's subsequent history is as detailed below.   PAST MEDICAL HISTORY: History reviewed. No pertinent past medical history.  PAST SURGICAL HISTORY: Past Surgical History:  Procedure Laterality Date  . IR IMAGING GUIDED PORT INSERTION  05/24/2019    FAMILY HISTORY: Family History  Problem Relation Age of Onset  . Breast cancer Mother   . Breast cancer Maternal Aunt    Patient's father is 91 and her mother 73 as of November 2020.  The patient's mother was diagnosed with breast cancer in her early 31s.  She lives in [place]. The patient's mother's sister was also diagnosed with breast cancer in her early 51s.  The patient herself has 1 sister, no brothers.  She is not aware of any ovarian cancer cases in the family.  GYNECOLOGIC HISTORY:  Patient's last menstrual period was 05/24/2019. Menarche: 29 years old Age at first live birth: 29 years old [place] P 2 LMPregular Contraceptive HRT n/a  Hysterectomy?  no BSO? no   SOCIAL HISTORY: (updated 04/2019)  Brianna Owens is currently working at [employer].  She is originally from [place].  She is divorced.  Her children are Brianna Owens, 25 years old, living in [place] with his father, and Brianna Owens, 66 years old, who lives with the patient.  Also at home are her uncle Brianna Owens, his wife, and that wife's cousin.     ADVANCED DIRECTIVES: Not in place   HEALTH MAINTENANCE: Social History   Tobacco Use  . Smoking status: Never Smoker  . Smokeless tobacco: Never Used  Substance Use Topics  . Alcohol use: Not Currently  . Drug use: Not Currently     Colonoscopy: n/a  PAP: 04/26/2019, negative  Bone density: n/a   No Known Allergies  Current Outpatient Medications  Medication Sig Dispense Refill  . dexamethasone (DECADRON) 4 MG tablet Take 2 tablets by mouth once a day on the day after chemotherapy and then take 2 tablets two times a day for 2 days. Take with food. 30 tablet 1  . lidocaine-prilocaine (EMLA) cream Apply to affected area once 30 g 3  . loratadine (CLARITIN) 10 MG tablet Take 1 tablet (10 mg total) by mouth daily. 60 tablet 0  . prochlorperazine (COMPAZINE) 10 MG tablet Take 1 tablet (10 mg total) by mouth every 6 (six) hours as needed (Nausea or vomiting). 30 tablet 1  . venlafaxine XR (EFFEXOR-XR) 75 MG 24 hr capsule Take 1 capsule (75 mg total) by mouth daily with breakfast. 30 capsule 4   No current facility-administered medications for this visit.    OBJECTIVE:   Vitals:   06/23/19 0953  BP: 118/75  Pulse: 72  Resp: 18  Temp: 97.9 F (36.6 C)  SpO2: 100%     Body mass index is 26.34 kg/m.   Wt Readings from Last 3 Encounters:  06/23/19 158 lb 4.8 oz (71.8 kg)  06/09/19 157 lb 3.2 oz (71.3 kg)  06/03/19 153 lb (69.4 kg)      ECOG FS:1 - Symptomatic but completely ambulatory GENERAL: Patient is a well appearing female in no acute distress HEENT:  Sclerae anicteric.  Mask in place. Neck is supple.  NODES:  No  cervical, supraclavicular, or axillary lymphadenopathy palpated.  BREAST EXAM: Breast mass in right lower central breast about 3 cm, palpable LUNGS:  Clear to auscultation bilaterally.  No wheezes or rhonchi. HEART:  Regular rate and rhythm. No murmur appreciated. ABDOMEN:  Soft, nontender.  Positive, normoactive bowel sounds. No organomegaly palpated. MSK:  No focal spinal tenderness to palpation. Full range of motion bilaterally in the upper extremities. EXTREMITIES:  No peripheral edema.   SKIN:  Clear with no obvious rashes or skin changes. No nail dyscrasia. NEURO:  Nonfocal. Well oriented.  Appropriate affect.    LAB RESULTS:  CMP     Component Value Date/Time   NA 139 06/23/2019 0859   K 4.0 06/23/2019 0859   CL 106 06/23/2019 0859   CO2 26 06/23/2019 0859   GLUCOSE 102 (H) 06/23/2019 0859   BUN 12 06/23/2019 0859   CREATININE 0.61 06/23/2019 0859   CALCIUM 8.7 (L) 06/23/2019 0859   PROT 7.3 06/23/2019 0859   ALBUMIN 4.1 06/23/2019 0859   AST 33 06/23/2019 0859   ALT 87 (H) 06/23/2019 0859   ALKPHOS 101  06/23/2019 0859   BILITOT <0.2 (L) 06/23/2019 0859   GFRNONAA >60 06/23/2019 0859   GFRAA >60 06/23/2019 0859    No results found for: TOTALPROTELP, ALBUMINELP, A1GS, A2GS, BETS, BETA2SER, GAMS, MSPIKE, SPEI  No results found for: KPAFRELGTCHN, LAMBDASER, [facility]  Lab Results  Component Value Date   WBC 7.5 06/23/2019   NEUTROABS 3.7 06/23/2019   HGB 9.8 (L) 06/23/2019   HCT 28.6 (L) 06/23/2019   MCV 90.2 06/23/2019   PLT 218 06/23/2019    No results found for: LABCA2  No components found for: YKDXIP382  No results for input(s): INR in the last 168 hours.  No results found for: LABCA2  No results found for: NKN397  No results found for: QBH419  No results found for: FXT024  No results found for: CA2729  No components found for: HGQUANT  No results found for: CEA1 / No results found for: CEA1   No results found for: AFPTUMOR  No results  found for: CHROMOGRNA  No results found for: PSA1  Appointment on 06/23/2019  Component Date Value Ref Range Status  . Preg Test, Ur 06/23/2019 NEGATIVE  NEGATIVE Final   Comment:        THE SENSITIVITY OF THIS METHODOLOGY IS >20 mIU/mL. Performed at [facility], [place] [address] [place], [place] [address]   . Sodium 06/23/2019 139  135 - 145 mmol/L Final  . Potassium 06/23/2019 4.0  3.5 - 5.1 mmol/L Final  . Chloride 06/23/2019 106  98 - 111 mmol/L Final  . CO2 06/23/2019 26  22 - 32 mmol/L Final  . Glucose, Bld 06/23/2019 102* 70 - 99 mg/dL Final  . BUN 06/23/2019 12  6 - 20 mg/dL Final  . Creatinine, Ser 06/23/2019 0.61  0.44 - 1.00 mg/dL Final  . Calcium 06/23/2019 8.7* 8.9 - 10.3 mg/dL Final  . Total Protein 06/23/2019 7.3  6.5 - 8.1 g/dL Final  . Albumin 06/23/2019 4.1  3.5 - 5.0 g/dL Final  . AST 06/23/2019 33  15 - 41 U/L Final  . ALT 06/23/2019 87* 0 - 44 U/L Final  . Alkaline Phosphatase 06/23/2019 101  38 - 126 U/L Final  . Total Bilirubin 06/23/2019 <0.2* 0.3 - 1.2 mg/dL Final  . GFR calc non Af Amer 06/23/2019 >60  >60 mL/min Final  . GFR calc Af Amer 06/23/2019 >60  >60 mL/min Final  . Anion gap 06/23/2019 7  5 - 15 Final   Performed at [facility], [place] [address] [place], [place] [address]  . WBC 06/23/2019 7.5  4.0 - 10.5 K/uL Final  . RBC 06/23/2019 3.17* 3.87 - 5.11 MIL/uL Final  . Hemoglobin 06/23/2019 9.8* 12.0 - 15.0 g/dL Final  . HCT 06/23/2019 28.6* 36.0 - 46.0 % Final  . MCV 06/23/2019 90.2  80.0 - 100.0 fL Final  . MCH 06/23/2019 30.9  26.0 - 34.0 pg Final  . MCHC 06/23/2019 34.3  30.0 - 36.0 g/dL Final  . RDW 06/23/2019 11.9  11.5 - 15.5 % Final  . Platelets 06/23/2019 218  150 - 400 K/uL Final  . nRBC 06/23/2019 0.7* 0.0 - 0.2 % Final  . Neutrophils Relative % 06/23/2019 49  % Final  . Neutro Abs 06/23/2019 3.7  1.7 - 7.7 K/uL Final  . Lymphocytes Relative 06/23/2019 18  % Final  . Lymphs Abs  06/23/2019 1.4  0.7 - 4.0 K/uL Final  . Monocytes Relative 06/23/2019 14  % Final  . Monocytes Absolute  06/23/2019 1.0  0.1 - 1.0 K/uL Final  . Eosinophils Relative 06/23/2019 1  % Final  . Eosinophils Absolute 06/23/2019 0.1  0.0 - 0.5 K/uL Final  . Basophils Relative 06/23/2019 1  % Final  . Basophils Absolute 06/23/2019 0.1  0.0 - 0.1 K/uL Final  . Immature Granulocytes 06/23/2019 17  % Final   Increased IG's, likely caused by Bone Marrow Colony Stimulating Factor received within 30 days.  . Abs Immature Granulocytes 06/23/2019 1.26* 0.00 - 0.07 K/uL Final   Performed at [facility], [place] [address], [place], [place] [address]    (this displays the last labs from the last 3 days)  No results found for: TOTALPROTELP, ALBUMINELP, A1GS, A2GS, BETS, BETA2SER, GAMS, MSPIKE, SPEI (this displays SPEP labs)  No results found for: KPAFRELGTCHN, LAMBDASER, KAPLAMBRATIO (kappa/lambda light chains)  No results found for: HGBA, HGBA2QUANT, HGBFQUANT, HGBSQUAN (Hemoglobinopathy evaluation)   No results found for: LDH  No results found for: IRON, TIBC, IRONPCTSAT (Iron and TIBC)  No results found for: FERRITIN  Urinalysis No results found for: COLORURINE, APPEARANCEUR, LABSPEC, PHURINE, GLUCOSEU, HGBUR, BILIRUBINUR, KETONESUR, PROTEINUR, UROBILINOGEN, NITRITE, LEUKOCYTESUR   STUDIES:   ELIGIBLE FOR AVAILABLE RESEARCH PROTOCOL:no  ASSESSMENT: 29 y.o. BRCA1 positive Brianna Owens woman status post right breast overlapping sites biopsy 05/04/2019 for a clinical T3 N0, stage IIIB invasive ductal carcinoma, grade 2, triple negative, with an MIB-1 of 40%.  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  The Common Hereditary Gene Panel offered by [facility] includes sequencing and/or  deletion duplication testing of the following 48 genes: APC, ATM, AXIN2, BARD1, BMPR1A, BRCA1, BRCA2, BRIP1, CDH1, CDK4, CDKN2A (p14ARF), CDKN2A (p16INK4a), CHEK2, CTNNA1, DICER1, EPCAM (Deletion/duplication testing only), GREM1 (promoter region deletion/duplication testing only), KIT, MEN1, MLH1, MSH2, MSH3, MSH6, MUTYH, NBN, NF1, NHTL1, PALB2, PDGFRA, PMS2, POLD1, POLE, PTEN, RAD50, RAD51C, RAD51D, RNF43, SDHB, SDHC, SDHD, SMAD4, SMARCA4. STK11, TP53, TSC1, TSC2, and VHL.  The following genes were evaluated for sequence changes only: SDHA and HOXB13 c.251G>A variant only. The report date is 05/24/2019.  (2) neoadjuvant chemotherapy consisting of doxorubicin and cyclophosphamide in dose dense fashion x4 started 05/26/2019 to be followed by paclitaxel and carboplatin weekly x12  (3) definitive surgery to follow  (4) adjuvant radiation to follow   PLAN: Brianna Owens is doing quite well today.  Her labs are stable, and she tolerated her last cycle of chemotherapy well.  She is ready to proceed with cycle 3 and will do so today.    Carmine and I discussed her decreased activity further.  I let her know that if she is not having side effects from the chemotherapy, as she had reported, then she should be getting up and remaining active.  I recommended that she walk 10 minutes twice a day.    She was late to her appointment today.  She notes she got stuck in traffic and denies having any transportation difficulties that we can assist with.    She will return in 2 weeks for labs, f/u, and her next treatment.  She was recommended to continue with the appropriate pandemic precautions. She knows to call for any questions that may arise between now and her next appointment.  We are happy to see her sooner if needed.  A total of (20) minutes of face-to-face time was spent with this patient with  greater than 50% of that time in counseling and care-coordination.   Wilber Bihari, NP   06/24/2019 12:18 PM Medical  Oncology and Hematology [facility] [place], [place] [address] Tel. [phone]    Fax. [phone]

## 2019-06-23 NOTE — Patient Instructions (Signed)
[facility] Discharge Instructions for Patients Receiving Chemotherapy  Today you received the following chemotherapy agents Adriamycin, Cytoxan.  To help prevent nausea and vomiting after your treatment, we encourage you to take your nausea medication as prescribed.   If you develop nausea and vomiting that is not controlled by your nausea medication, call the clinic.   BELOW ARE SYMPTOMS THAT SHOULD BE REPORTED IMMEDIATELY:  *FEVER GREATER THAN 100.5 F  *CHILLS WITH OR WITHOUT FEVER  NAUSEA AND VOMITING THAT IS NOT CONTROLLED WITH YOUR NAUSEA MEDICATION  *UNUSUAL SHORTNESS OF BREATH  *UNUSUAL BRUISING OR BLEEDING  TENDERNESS IN MOUTH AND THROAT WITH OR WITHOUT PRESENCE OF ULCERS  *URINARY PROBLEMS  *BOWEL PROBLEMS  UNUSUAL RASH Items with * indicate a potential emergency and should be followed up as soon as possible.  Feel free to call the clinic should you have any questions or concerns. The clinic phone number is [phone].  Please show the CHEMO ALERT CARD at check-in to the Emergency Department and triage nurse.   

## 2019-06-23 NOTE — Progress Notes (Signed)
Per Wilber Bihari, NP okay for patient to receive treatment today with ALT 87

## 2019-06-23 NOTE — Patient Instructions (Signed)

## 2019-06-25 ENCOUNTER — Inpatient Hospital Stay: Payer: No Typology Code available for payment source

## 2019-06-25 ENCOUNTER — Other Ambulatory Visit: Payer: Self-pay

## 2019-06-25 VITALS — BP 126/84 | HR 77 | Temp 98.9°F | Resp 18

## 2019-06-25 MED ORDER — PEGFILGRASTIM-CBQV 6 MG/0.6ML ~~LOC~~ SOSY
6.0000 mg | PREFILLED_SYRINGE | Freq: Once | SUBCUTANEOUS | Status: AC
  Administered 2019-06-25: 6 mg via SUBCUTANEOUS

## 2019-06-25 NOTE — Patient Instructions (Signed)
Pegfilgrastim injection What is this medicine? PEGFILGRASTIM (PEG fil gra stim) is a long-acting granulocyte colony-stimulating factor that stimulates the growth of neutrophils, a type of white blood cell important in the body's fight against infection. It is used to reduce the incidence of fever and infection in patients with certain types of cancer who are receiving chemotherapy that affects the bone marrow, and to increase survival after being exposed to high doses of radiation. This medicine may be used for other purposes; ask your health care provider or pharmacist if you have questions. COMMON BRAND NAME(S): Fulphila, Neulasta, UDENYCA, Ziextenzo What should I tell my health care provider before I take this medicine? They need to know if you have any of these conditions:  kidney disease  latex allergy  ongoing radiation therapy  sickle cell disease  skin reactions to acrylic adhesives (On-Body Injector only)  an unusual or allergic reaction to pegfilgrastim, filgrastim, other medicines, foods, dyes, or preservatives  pregnant or trying to get pregnant  breast-feeding How should I use this medicine? This medicine is for injection under the skin. If you get this medicine at home, you will be taught how to prepare and give the pre-filled syringe or how to use the On-body Injector. Refer to the patient Instructions for Use for detailed instructions. Use exactly as directed. Tell your healthcare provider immediately if you suspect that the On-body Injector may not have performed as intended or if you suspect the use of the On-body Injector resulted in a missed or partial dose. It is important that you put your used needles and syringes in a special sharps container. Do not put them in a trash can. If you do not have a sharps container, call your pharmacist or healthcare provider to get one. Talk to your pediatrician regarding the use of this medicine in children. While this drug may be  prescribed for selected conditions, precautions do apply. Overdosage: If you think you have taken too much of this medicine contact a poison control center or emergency room at once. NOTE: This medicine is only for you. Do not share this medicine with others. What if I miss a dose? It is important not to miss your dose. Call your doctor or health care professional if you miss your dose. If you miss a dose due to an On-body Injector failure or leakage, a new dose should be administered as soon as possible using a single prefilled syringe for manual use. What may interact with this medicine? Interactions have not been studied. Give your health care provider a list of all the medicines, herbs, non-prescription drugs, or dietary supplements you use. Also tell them if you smoke, drink alcohol, or use illegal drugs. Some items may interact with your medicine. This list may not describe all possible interactions. Give your health care provider a list of all the medicines, herbs, non-prescription drugs, or dietary supplements you use. Also tell them if you smoke, drink alcohol, or use illegal drugs. Some items may interact with your medicine. What should I watch for while using this medicine? You may need blood work done while you are taking this medicine. If you are going to need a MRI, CT scan, or other procedure, tell your doctor that you are using this medicine (On-Body Injector only). What side effects may I notice from receiving this medicine? Side effects that you should report to your doctor or health care professional as soon as possible:  allergic reactions like skin rash, itching or hives, swelling of the   face, lips, or tongue  back pain  dizziness  fever  pain, redness, or irritation at site where injected  pinpoint red spots on the skin  red or dark-brown urine  shortness of breath or breathing problems  stomach or side pain, or pain at the  shoulder  swelling  tiredness  trouble passing urine or change in the amount of urine Side effects that usually do not require medical attention (report to your doctor or health care professional if they continue or are bothersome):  bone pain  muscle pain This list may not describe all possible side effects. Call your doctor for medical advice about side effects. You may report side effects to FDA at 1-800-FDA-1088. Where should I keep my medicine? Keep out of the reach of children. If you are using this medicine at home, you will be instructed on how to store it. Throw away any unused medicine after the expiration date on the label. NOTE: This sheet is a summary. It may not cover all possible information. If you have questions about this medicine, talk to your doctor, pharmacist, or health care provider.  2020 Elsevier/Gold Standard (2017-09-07 16:57:08)  Coronavirus (COVID-19) Are you at risk?  Are you at risk for the Coronavirus (COVID-19)?  To be considered HIGH RISK for Coronavirus (COVID-19), you have to meet the following criteria:  . Traveled to [place], [place], [place], [place] or [place]; or in the [place] to [place], [place], [place], or [place]; and have fever, cough, and shortness of breath within the last 2 weeks of travel OR . Been in close contact with a person diagnosed with COVID-19 within the last 2 weeks and have fever, cough, and shortness of breath . IF YOU DO NOT MEET THESE CRITERIA, YOU ARE CONSIDERED LOW RISK FOR COVID-19.  What to do if you are HIGH RISK for COVID-19?  Marland Kitchen If you are having a medical emergency, call 911. . Seek medical care right away. Before you go to a doctor's office, urgent care or emergency department, call ahead and tell them about your recent travel, contact with someone diagnosed with COVID-19, and your symptoms. You should receive instructions from your physician's office regarding next steps of care.  . When you arrive  at healthcare provider, tell the healthcare staff immediately you have returned from visiting [place], [place], [place], [place] or [place]; or traveled in the [place] to [place], [place], [place], or [place]; in the last two weeks or you have been in close contact with a person diagnosed with COVID-19 in the last 2 weeks.   . Tell the health care staff about your symptoms: fever, cough and shortness of breath. . After you have been seen by a medical provider, you will be either: o Tested for (COVID-19) and discharged home on quarantine except to seek medical care if symptoms worsen, and asked to  - Stay home and avoid contact with others until you get your results (4-5 days)  - Avoid travel on public transportation if possible (such as bus, train, or airplane) or o Sent to the Emergency Department by EMS for evaluation, COVID-19 testing, and possible admission depending on your condition and test results.  What to do if you are LOW RISK for COVID-19?  Reduce your risk of any infection by using the same precautions used for avoiding the common cold or flu:  Marland Kitchen Wash your hands often with soap and warm water for at least 20 seconds.  If soap  and water are not readily available, use an alcohol-based hand sanitizer with at least 60% alcohol.  . If coughing or sneezing, cover your mouth and nose by coughing or sneezing into the elbow areas of your shirt or coat, into a tissue or into your sleeve (not your hands). . Avoid shaking hands with others and consider head nods or verbal greetings only. . Avoid touching your eyes, nose, or mouth with unwashed hands.  . Avoid close contact with people who are sick. . Avoid places or events with large numbers of people in one location, like concerts or sporting events. . Carefully consider travel plans you have or are making. . If you are planning any travel outside or inside the [place], visit the CDC's Travelers' Health webpage for the latest health  notices. . If you have some symptoms but not all symptoms, continue to monitor at home and seek medical attention if your symptoms worsen. . If you are having a medical emergency, call 911.   [place] / e-Visit: [email or web address]         [facility]: [facility]: [id number]                   [facility]: [phone]

## 2019-07-06 NOTE — Progress Notes (Signed)
[place]  Telephone:(336) [phone] [phone]     ID: Brianna Owens DOB: 09-Jul-1990  MR#: [id number]  LAG#:[id number]  Patient Care Team: Patient, [name] as PCP - General (General Practice) Lashawndra Lampkins, [name], MD as Consulting Physician (Oncology) Erroll Luna, MD as Consulting Physician (General Surgery) [name], RN as Oncology Nurse Navigator [name], RN as Oncology Nurse Navigator Chauncey Cruel, MD OTHER MD:  CHIEF COMPLAINT: Triple negative breast cancer, BRCA1+  CURRENT TREATMENT: neoadjuvant chemotherapy   INTERVAL HISTORY: Brianna Owens returns today for follow up and treatment of her triple negative breast cancer.    She is receiving neoadjuvant chemotherapy consisting of doxorubicin and cyclophosphamide in dose dense fashion x4 with Neulasta support with on day 3.  Today is cycle 4 day 1 of therapy.  She completes this part of her treatment today  Her most recent echocardiogram on 05/25/2019 showed an ejection fraction of 55-60%.   REVIEW OF SYSTEMS: [name] is not working.  She is standing at home a lot and she finds it is very boring.  She wonders if she should move to [place] and live with her mother and finish her treatments there.  She actually is not sure what town in [place] her mother lives in.  She has had no unusual headaches visual changes cough unproductive pleurisy shortness of breath or change in bowel or bladder habits.  A detailed review of systems today was otherwise stable.     HISTORY OF CURRENT ILLNESS: From the original intake note:  Rockford [facility] with a 3 month history of a right breast lump that became painful in early 04/2019. Physical exam performed at that time showed a palpable, tender 13 cm lump within the right center breast under the nipple area. She underwent right breast ultrasonography at [facility] on 04/27/2019 showing: 6.1 cm palpable mass centered in the 5 o'clock position of the right breast; single right inferomedial axillary lymph node with focal cortical thickening inferiorly.  Accordingly on 05/04/2019 she proceeded to biopsy of the right breast area in question. The pathology from this procedure ([id number]) showed: invasive ductal carcinoma, grade 2-3. Prognostic indicators significant for: estrogen receptor, 0% negative and progesterone receptor, 0% negative. Proliferation marker Ki67 at 40%.  I do not find HER-2 receptor documentation  The right axillary lymph node biopsied at that time showed reactive germinal centers.  She underwent bilateral diagnostic mammography with tomography at [facility] on 05/05/2019 showing: breast density category D; biopsy-proven right breast malignancy measures 6.1 cm; no additional masses or calcifications seen in the right breast; no evidence of malignancy in the left breast.  The patient's subsequent history is as detailed below.   PAST MEDICAL HISTORY: No past medical history on file.  PAST SURGICAL HISTORY: Past Surgical History:  Procedure Laterality Date  . IR IMAGING GUIDED PORT INSERTION  05/24/2019    FAMILY HISTORY: Family History  Problem Relation Age of Onset  . Breast cancer Mother   . Breast cancer Maternal Aunt    Patient's father is 19 and her mother 40 as of November 2020.  The patient's mother was diagnosed with breast cancer in her early 24s.  She lives in [place]. The patient's mother's sister was also diagnosed with breast cancer in her early 10s.  The patient herself has 1 sister, no brothers.  She is not aware of any ovarian cancer cases in  the family.   GYNECOLOGIC HISTORY:  No LMP recorded. Menarche: 29 years old Age at first live birth: 29 years old [place] P 2 LMPregular Contraceptive HRT n/a  Hysterectomy? no BSO? no   SOCIAL HISTORY: (updated 04/2019)  Brianna Owens is currently working at  [employer].  She is originally from [place].  She is divorced.  Her children are Jefm Owens, 61 years old, living in [place] with his father, and Brianna Owens, 58 years old, who lives with the patient.  Also at home are her uncle [name], his wife, and that wife's cousin.     ADVANCED DIRECTIVES: Not in place   HEALTH MAINTENANCE: Social History   Tobacco Use  . Smoking status: Never Smoker  . Smokeless tobacco: Never Used  Substance Use Topics  . Alcohol use: Not Currently  . Drug use: Not Currently     Colonoscopy: n/a  PAP: 04/26/2019, negative  Bone density: n/a   No Known Allergies  Current Outpatient Medications  Medication Sig Dispense Refill  . dexamethasone (DECADRON) 4 MG tablet Take 2 tablets by mouth once a day on the day after chemotherapy and then take 2 tablets two times a day for 2 days. Take with food. 30 tablet 1  . lidocaine-prilocaine (EMLA) cream Apply to affected area once 30 g 3  . loratadine (CLARITIN) 10 MG tablet Take 1 tablet (10 mg total) by mouth daily. 60 tablet 0  . prochlorperazine (COMPAZINE) 10 MG tablet Take 1 tablet (10 mg total) by mouth every 6 (six) hours as needed (Nausea or vomiting). 30 tablet 1  . venlafaxine XR (EFFEXOR-XR) 75 MG 24 hr capsule Take 1 capsule (75 mg total) by mouth daily with breakfast. 30 capsule 4   No current facility-administered medications for this visit.    OBJECTIVE: Young Spanish speaker in no acute distress  Vitals:   07/07/19 1136  BP: (!) 125/51  Pulse: 91  Resp: 18  Temp: 99.1 F (37.3 C)  SpO2: 100%     Body mass index is 23.7 kg/m.   Wt Readings from Last 3 Encounters:  07/07/19 155 lb 14.4 oz (70.7 kg)  06/23/19 158 lb 4.8 oz (71.8 kg)  06/09/19 157 lb 3.2 oz (71.3 kg)      ECOG FS:1 - Symptomatic but completely ambulatory  Sclerae unicteric, EOMs intact Wearing a mask No cervical or supraclavicular adenopathy Lungs no rales or rhonchi Heart regular rate and rhythm Abd soft, nontender,  positive bowel sounds MSK no focal spinal tenderness, no upper extremity lymphedema Neuro: nonfocal, well oriented, appropriate affect Breasts: The right breast mass is still palpable in the inferior aspect of the breast, measuring perhaps 1/2 cm.  It is movable.  There are no skin or nipple changes.  Left breast is benign.  Both axillae are benign.   LAB RESULTS:  CMP     Component Value Date/Time   NA 139 06/23/2019 0859   K 4.0 06/23/2019 0859   CL 106 06/23/2019 0859   CO2 26 06/23/2019 0859   GLUCOSE 102 (H) 06/23/2019 0859   BUN 12 06/23/2019 0859   CREATININE 0.61 06/23/2019 0859   CALCIUM 8.7 (L) 06/23/2019 0859   PROT 7.3 06/23/2019 0859   ALBUMIN 4.1 06/23/2019 0859   AST 33 06/23/2019 0859   ALT 87 (H) 06/23/2019 0859   ALKPHOS 101 06/23/2019 0859   BILITOT <0.2 (L) 06/23/2019 0859   GFRNONAA >60 06/23/2019 0859   GFRAA >60 06/23/2019 0859    No results found for: TOTALPROTELP,  ALBUMINELP, A1GS, A2GS, BETS, BETA2SER, GAMS, MSPIKE, SPEI  No results found for: KPAFRELGTCHN, LAMBDASER, KAPLAMBRATIO  Lab Results  Component Value Date   WBC 20.0 (H) 07/07/2019   NEUTROABS PENDING 07/07/2019   HGB 9.5 (L) 07/07/2019   HCT 27.8 (L) 07/07/2019   MCV 90.6 07/07/2019   PLT 176 07/07/2019    No results found for: LABCA2  No components found for: [id number]  No results for input(s): INR in the last 168 hours.  No results found for: LABCA2  No results found for: FSF423  No results found for: TRV202  No results found for: BXI356  No results found for: CA2729  No components found for: HGQUANT  No results found for: CEA1 / No results found for: CEA1   No results found for: AFPTUMOR  No results found for: CHROMOGRNA  No results found for: HGBA, HGBA2QUANT, HGBFQUANT, HGBSQUAN (Hemoglobinopathy evaluation)   No results found for: LDH  No results found for: IRON, TIBC, IRONPCTSAT (Iron and TIBC)  No results found for: FERRITIN  Urinalysis No  results found for: COLORURINE, APPEARANCEUR, LABSPEC, PHURINE, GLUCOSEU, HGBUR, BILIRUBINUR, KETONESUR, PROTEINUR, UROBILINOGEN, NITRITE, LEUKOCYTESUR   STUDIES: No results found.   ELIGIBLE FOR AVAILABLE RESEARCH PROTOCOL:no  ASSESSMENT: 29 y.o. BRCA1 positive [name] woman status post right breast overlapping sites biopsy 05/04/2019 for a clinical T3 N0, stage IIIB invasive ductal carcinoma, grade 2, triple negative, with an MIB-1 of 40%.  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  The Common Hereditary Gene Panel offered by [facility] includes sequencing and/or deletion duplication testing of the following 48 genes: APC, ATM, AXIN2, BARD1, BMPR1A, BRCA1, BRCA2, BRIP1, CDH1, CDK4, CDKN2A (p14ARF), CDKN2A (p16INK4a), CHEK2, CTNNA1, DICER1, EPCAM (Deletion/duplication testing only), GREM1 (promoter region deletion/duplication testing only), KIT, MEN1, MLH1, MSH2, MSH3, MSH6, MUTYH, NBN, NF1, NHTL1, PALB2, PDGFRA, PMS2, POLD1, POLE, PTEN, RAD50, RAD51C, RAD51D, RNF43, SDHB, SDHC, SDHD, SMAD4, SMARCA4. STK11, TP53, TSC1, TSC2, and VHL.  The following genes were evaluated for sequence changes only: SDHA and HOXB13 c.251G>A variant only. The report date is 05/24/2019.  (2) neoadjuvant chemotherapy consisting of doxorubicin and cyclophosphamide in dose dense fashion x4 started 05/26/2019 to be followed by paclitaxel and carboplatin weekly x12  (3) definitive surgery to follow  (4) adjuvant radiation to follow   PLAN: Adella has tolerated the first part of her chemo remarkably well and she completes that today.  She understands this is a more intensive and difficult part.  She has done a very good job of getting through it.  I told her it is fine for her to complete her treatment somewhere else but what it  would not be fine is to interrupt her treatment.  She cannot have have her treatment now on the other half in 6 months.  She is set up for all her treatments here already and my strong recommendation is that she go ahead and completed.  After our discussion today she is willing to proceed with the rest of the chemotherapy and then surgery here.  The radiation of course can be done here or elsewhere.  I am entering all her chemo orders and appointments through April.  She knows to call for any other issue that may develop before the next visit.  Total encounter time 35 minutes.Brianna Owens Jews C. Jamieson Hetland, MD   07/07/2019 12:00 PM Medical Oncology  and Hematology [facility] [place], [place] [address] Tel. [phone]    Fax. [phone]   I, Wilburn Mylar, am acting as scribe for Dr. [name]. Chonita Gadea.  I, Lurline Del MD, have reviewed the above documentation for accuracy and completeness, and I agree with the above.    *Total Encounter Time as defined by the Centers for Medicare and Medicaid Services includes, in addition to the face-to-face time of a patient visit (documented in the note above) non-face-to-face time: obtaining and reviewing outside history, ordering and reviewing medications, tests or procedures, care coordination (communications with other health care professionals or caregivers) and documentation in the medical record.

## 2019-07-07 ENCOUNTER — Inpatient Hospital Stay: Payer: No Typology Code available for payment source

## 2019-07-07 ENCOUNTER — Encounter: Payer: Self-pay | Admitting: *Deleted

## 2019-07-07 ENCOUNTER — Inpatient Hospital Stay (HOSPITAL_BASED_OUTPATIENT_CLINIC_OR_DEPARTMENT_OTHER): Payer: Self-pay | Admitting: Oncology

## 2019-07-07 ENCOUNTER — Other Ambulatory Visit: Payer: Self-pay

## 2019-07-07 VITALS — BP 125/51 | HR 91 | Temp 99.1°F | Resp 18 | Ht 68.0 in | Wt 155.9 lb

## 2019-07-07 DIAGNOSIS — Z171 Estrogen receptor negative status [ER-]: Secondary | ICD-10-CM

## 2019-07-07 DIAGNOSIS — C50811 Malignant neoplasm of overlapping sites of right female breast: Secondary | ICD-10-CM

## 2019-07-07 DIAGNOSIS — Z1502 Genetic susceptibility to malignant neoplasm of ovary: Secondary | ICD-10-CM

## 2019-07-07 DIAGNOSIS — Z1501 Genetic susceptibility to malignant neoplasm of breast: Secondary | ICD-10-CM

## 2019-07-07 DIAGNOSIS — Z01419 Encounter for gynecological examination (general) (routine) without abnormal findings: Secondary | ICD-10-CM

## 2019-07-07 LAB — CBC WITH DIFFERENTIAL/PLATELET
Abs Immature Granulocytes: 3.6 10*3/uL — ABNORMAL HIGH (ref 0.00–0.07)
Basophils Absolute: 0.1 10*3/uL (ref 0.0–0.1)
Basophils Relative: 0 %
Eosinophils Absolute: 0.1 10*3/uL (ref 0.0–0.5)
Eosinophils Relative: 0 %
HCT: 27.8 % — ABNORMAL LOW (ref 36.0–46.0)
Hemoglobin: 9.5 g/dL — ABNORMAL LOW (ref 12.0–15.0)
Immature Granulocytes: 18 %
Lymphocytes Relative: 8 %
Lymphs Abs: 1.7 10*3/uL (ref 0.7–4.0)
MCH: 30.9 pg (ref 26.0–34.0)
MCHC: 34.2 g/dL (ref 30.0–36.0)
MCV: 90.6 fL (ref 80.0–100.0)
Monocytes Absolute: 1.4 10*3/uL — ABNORMAL HIGH (ref 0.1–1.0)
Monocytes Relative: 7 %
Neutro Abs: 13.3 10*3/uL — ABNORMAL HIGH (ref 1.7–7.7)
Neutrophils Relative %: 67 %
Platelets: 176 10*3/uL (ref 150–400)
RBC: 3.07 MIL/uL — ABNORMAL LOW (ref 3.87–5.11)
RDW: 14 % (ref 11.5–15.5)
WBC: 20 10*3/uL — ABNORMAL HIGH (ref 4.0–10.5)
nRBC: 0.2 % (ref 0.0–0.2)

## 2019-07-07 LAB — COMPREHENSIVE METABOLIC PANEL
ALT: 83 U/L — ABNORMAL HIGH (ref 0–44)
AST: 35 U/L (ref 15–41)
Albumin: 4.2 g/dL (ref 3.5–5.0)
Alkaline Phosphatase: 138 U/L — ABNORMAL HIGH (ref 38–126)
Anion gap: 8 (ref 5–15)
BUN: 11 mg/dL (ref 6–20)
CO2: 25 mmol/L (ref 22–32)
Calcium: 9.1 mg/dL (ref 8.9–10.3)
Chloride: 108 mmol/L (ref 98–111)
Creatinine, Ser: 0.6 mg/dL (ref 0.44–1.00)
GFR calc Af Amer: >60 mL/min (ref >60)
GFR calc non Af Amer: >60 mL/min (ref >60)
Glucose, Bld: 97 mg/dL (ref 70–99)
Potassium: 4.2 mmol/L (ref 3.5–5.1)
Sodium: 141 mmol/L (ref 135–145)
Total Bilirubin: 0.3 mg/dL (ref 0.3–1.2)
Total Protein: 7.4 g/dL (ref 6.5–8.1)

## 2019-07-07 LAB — PREGNANCY, URINE: Preg Test, Ur: NEGATIVE

## 2019-07-07 MED ORDER — PALONOSETRON HCL INJECTION 0.25 MG/5ML
0.2500 mg | Freq: Once | INTRAVENOUS | Status: AC
  Administered 2019-07-07: 13:00:00 0.25 mg via INTRAVENOUS

## 2019-07-07 MED ORDER — GOSERELIN ACETATE 3.6 MG ~~LOC~~ IMPL: 3.6000 mg | DRUG_IMPLANT | Freq: Once | SUBCUTANEOUS | Status: DC

## 2019-07-07 MED ORDER — PALONOSETRON HCL INJECTION 0.25 MG/5ML
INTRAVENOUS | Status: AC
  Filled 2019-07-07: qty 5

## 2019-07-07 MED ORDER — SODIUM CHLORIDE 0.9 % IV SOLN
Freq: Once | INTRAVENOUS | Status: AC
  Filled 2019-07-07: qty 5

## 2019-07-07 MED ORDER — SODIUM CHLORIDE 0.9 % IV SOLN
Freq: Once | INTRAVENOUS | Status: AC
  Filled 2019-07-07: qty 250

## 2019-07-07 MED ORDER — DOXORUBICIN HCL CHEMO IV INJECTION 2 MG/ML
60.0000 mg/m2 | Freq: Once | INTRAVENOUS | Status: AC
  Administered 2019-07-07: 108 mg via INTRAVENOUS
  Filled 2019-07-07: qty 54

## 2019-07-07 MED ORDER — HEPARIN SOD (PORK) LOCK FLUSH 100 UNIT/ML IV SOLN
500.0000 [IU] | Freq: Once | INTRAVENOUS | Status: AC | PRN
  Administered 2019-07-07: 15:00:00 500 [IU]
  Filled 2019-07-07: qty 5

## 2019-07-07 MED ORDER — GOSERELIN ACETATE 3.6 MG ~~LOC~~ IMPL
DRUG_IMPLANT | SUBCUTANEOUS | Status: AC
  Filled 2019-07-07: qty 3.6

## 2019-07-07 MED ORDER — SODIUM CHLORIDE 0.9 % IV SOLN
600.0000 mg/m2 | Freq: Once | INTRAVENOUS | Status: AC
  Administered 2019-07-07: 14:00:00 1080 mg via INTRAVENOUS
  Filled 2019-07-07: qty 54

## 2019-07-07 MED ORDER — SODIUM CHLORIDE 0.9% FLUSH
10.0000 mL | INTRAVENOUS | Status: DC | PRN
  Administered 2019-07-07: 15:00:00 10 mL
  Filled 2019-07-07: qty 10

## 2019-07-07 NOTE — Progress Notes (Signed)
Per Dr. Jana Hakim, ok to treat with elevated ALT.

## 2019-07-07 NOTE — Patient Instructions (Signed)
[facility] Discharge Instructions for Patients Receiving Chemotherapy  Today you received the following chemotherapy agents: Adriamycin, Cytoxan  To help prevent nausea and vomiting after your treatment, we encourage you to take your nausea medication as directed.   If you develop nausea and vomiting that is not controlled by your nausea medication, call the clinic.   BELOW ARE SYMPTOMS THAT SHOULD BE REPORTED IMMEDIATELY:  *FEVER GREATER THAN 100.5 F  *CHILLS WITH OR WITHOUT FEVER  NAUSEA AND VOMITING THAT IS NOT CONTROLLED WITH YOUR NAUSEA MEDICATION  *UNUSUAL SHORTNESS OF BREATH  *UNUSUAL BRUISING OR BLEEDING  TENDERNESS IN MOUTH AND THROAT WITH OR WITHOUT PRESENCE OF ULCERS  *URINARY PROBLEMS  *BOWEL PROBLEMS  UNUSUAL RASH Items with * indicate a potential emergency and should be followed up as soon as possible.  Feel free to call the clinic should you have any questions or concerns. The clinic phone number is [phone].  Please show the CHEMO ALERT CARD at check-in to the Emergency Department and triage nurse.   

## 2019-07-08 NOTE — Progress Notes (Signed)
Brianna Owens is due for Zoladex. It is currently in "holding pattern."    Theotis Burrow has faxed an application for Zoladex patient assistance to [employer] today (07/08/19).   We do not have a determination yet.  In order to avoid a financial burden on the patient, we have discussed w/ Dr. Jana Hakim and he ok'd that we wait on Zoladex until we have heard from drug assistance.  Pt will only receive Udenyca on 07/09/19.  She has been approved for [employer] assistance.  Kennith Center, Pharm.D., CPP 07/08/2019@12 :51 PM

## 2019-07-09 ENCOUNTER — Inpatient Hospital Stay: Payer: No Typology Code available for payment source

## 2019-07-09 ENCOUNTER — Other Ambulatory Visit: Payer: Self-pay

## 2019-07-09 VITALS — BP 116/77 | HR 86 | Temp 99.1°F | Resp 16

## 2019-07-09 DIAGNOSIS — C50811 Malignant neoplasm of overlapping sites of right female breast: Secondary | ICD-10-CM

## 2019-07-09 MED ORDER — PEGFILGRASTIM-CBQV 6 MG/0.6ML ~~LOC~~ SOSY
6.0000 mg | PREFILLED_SYRINGE | Freq: Once | SUBCUTANEOUS | Status: AC
  Administered 2019-07-09: 6 mg via SUBCUTANEOUS

## 2019-07-12 ENCOUNTER — Encounter: Payer: Self-pay | Admitting: *Deleted

## 2019-07-14 ENCOUNTER — Other Ambulatory Visit: Payer: Self-pay | Admitting: Oncology

## 2019-07-14 NOTE — Progress Notes (Signed)
START OFF PATHWAY REGIMEN - Breast   [id number]:Carboplatin + Paclitaxel (2/80) weekly:   Administer weekly:     Paclitaxel      Carboplatin   **Always confirm dose/schedule in your pharmacy ordering system**  Patient Characteristics: Preoperative or Nonsurgical Candidate (Clinical Staging), Neoadjuvant Therapy followed by Surgery, Invasive Disease, Chemotherapy, HER2 Negative/Unknown/Equivocal, ER Negative/Unknown, Platinum Therapy Indicated Therapeutic Status: Preoperative or Nonsurgical Candidate (Clinical Staging) AJCC M Category: cM0 AJCC Grade: G3 Breast Surgical Plan: Neoadjuvant Therapy followed by Surgery ER Status: Negative (-) AJCC 8 Stage Grouping: IIIB HER2 Status: Negative (-) AJCC T Category: cT2 AJCC N Category: cN1 PR Status: Negative (-) Type of Therapy: Platinum Therapy Indicated Intent of Therapy: Curative Intent, Discussed with Patient

## 2019-07-20 NOTE — Progress Notes (Signed)
[name]  Telephone:([phone]) [phone] [phone]     ID: Brianna Owens DOB: 12-05-1990  MR#: [id number]  TLX#:[id number]  Patient Care Team: Patient, [name] as PCP - General (General Practice) Khoa Opdahl, [name], MD as Consulting Physician (Oncology) Erroll Luna, MD as Consulting Physician (General Surgery) [name], RN as Oncology Nurse Navigator [name], RN as Oncology Nurse Navigator Chauncey Cruel, MD OTHER MD:  CHIEF COMPLAINT: Triple negative breast cancer, BRCA1+  CURRENT TREATMENT: neoadjuvant chemotherapy   INTERVAL HISTORY: Brianna Owens returns today for follow up and treatment of her triple negative breast cancer accompanied by a translator.    She completed doxorubicin and cyclophosphamide on 07/07/2019. She is now ready to begin weekly paclitacxel and carboplatin x12.  Her most recent echocardiogram on 05/25/2019 showed an ejection fraction of 55-60%.   REVIEW OF SYSTEMS: Brianna Owens feels ready to get back to work.  She thinks she could lift 20 or 30 pounds easily.  Currently she is staying at home and doing a lot of sleeping.  She missed her last Zoladex not because she was not ready to receive it but because we did not have approval.  Problems arise at the new year as insurance changes.  Hopefully we have this approved now and she will be able to receive it today.  In any case she has not had a.  For several months and she is having hot flashes at present.  A detailed review of systems today was otherwise noncontributory     HISTORY OF CURRENT ILLNESS: From the original intake note:  Brianna Owens [facility] with a 3 month history of a right breast lump that became painful in early 04/2019. Physical exam performed at that time showed a palpable, tender 13 cm lump within the right center breast under the nipple area. She underwent right breast  ultrasonography at [facility] on 04/27/2019 showing: 6.1 cm palpable mass centered in the 5 o'clock position of the right breast; single right inferomedial axillary lymph node with focal cortical thickening inferiorly.  Accordingly on 05/04/2019 she proceeded to biopsy of the right breast area in question. The pathology from this procedure ([id number]) showed: invasive ductal carcinoma, grade 2-3. Prognostic indicators significant for: estrogen receptor, 0% negative and progesterone receptor, 0% negative. Proliferation marker Ki67 at 40%.  I do not find HER-2 receptor documentation  The right axillary lymph node biopsied at that time showed reactive germinal centers.  She underwent bilateral diagnostic mammography with tomography at [facility] on 05/05/2019 showing: breast density category D; biopsy-proven right breast malignancy measures 6.1 cm; no additional masses or calcifications seen in the right breast; no evidence of malignancy in the left breast.  The patient's subsequent history is as detailed below.   PAST MEDICAL HISTORY: No past medical history on file.  PAST SURGICAL HISTORY: Past Surgical History:  Procedure Laterality Date  . IR IMAGING GUIDED PORT INSERTION  05/24/2019    FAMILY HISTORY: Family History  Problem Relation Age of Onset  . Breast cancer Mother   . Breast cancer Maternal Aunt    Patient's father is 1 and her mother 69 as of November 2020.  The patient's mother was diagnosed with breast cancer in her early 27s.  She lives in [place]. The patient's mother's sister was also diagnosed with breast cancer in her early 40s.  The patient herself has 1 sister, no brothers.  She is not  aware of any ovarian cancer cases in the family.   GYNECOLOGIC HISTORY:  No LMP recorded. Menarche: 29 years old Age at first live birth: 29 years old [place] P 2 LMPregular Contraceptive HRT n/a  Hysterectomy? no BSO? no   SOCIAL HISTORY: (updated 04/2019)    [name] is currently working at [employer]  She is originally from [place].  She is divorced.  Her children are Brianna Owens, 27 years old, living in [place] with his father, and Brianna Owens, 35 years old, who lives with the patient.  Also at home are her uncle Langston Brianna Owens, his wife, and that wife's cousin.     ADVANCED DIRECTIVES: Not in place   HEALTH MAINTENANCE: Social History   Tobacco Use  . Smoking status: Never Smoker  . Smokeless tobacco: Never Used  Substance Use Topics  . Alcohol use: Not Currently  . Drug use: Not Currently     Colonoscopy: n/a  PAP: 04/26/2019, negative  Bone density: n/a   No Known Allergies  Current Outpatient Medications  Medication Sig Dispense Refill  . lidocaine-prilocaine (EMLA) cream Apply to affected area once 30 g 3  . loratadine (CLARITIN) 10 MG tablet Take 1 tablet (10 mg total) by mouth daily. 60 tablet 0  . prochlorperazine (COMPAZINE) 10 MG tablet Take 1 tablet (10 mg total) by mouth every 6 (six) hours as needed (Nausea or vomiting). 30 tablet 1  . venlafaxine XR (EFFEXOR-XR) 75 MG 24 hr capsule Take 1 capsule (75 mg total) by mouth daily with breakfast. 30 capsule 4   No current facility-administered medications for this visit.    OBJECTIVE: Brianna Owens Spanish speaker who appears stated age  29:   07/21/19 1050  BP: 110/74  Pulse: 86  Resp: 20  Temp: 98.5 F (36.9 C)  SpO2: 100%     Body mass index is 23.48 kg/m.   Wt Readings from Last 3 Encounters:  07/21/19 154 lb 6.4 oz (70 kg)  07/07/19 155 lb 14.4 oz (70.7 kg)  06/23/19 158 lb 4.8 oz (71.8 kg)      ECOG FS:1 - Symptomatic but completely ambulatory  Sclerae unicteric, EOMs intact Wearing a mask No cervical or supraclavicular adenopathy Lungs no rales or rhonchi Heart regular rate and rhythm Abd soft, nontender, positive bowel sounds MSK no focal spinal tenderness, no upper extremity lymphedema Neuro: nonfocal, well oriented, appropriate affect Breasts: The mass in  the right breast is still palpable, feeling up from the inframammary fold medially.  It measures 1-1/2 cm at most.  It is easily movable.  There is no skin or nipple involvement.  Left breast is benign.  Both axillae are benign.   LAB RESULTS:  CMP     Component Value Date/Time   NA 141 07/07/2019 1138   K 4.2 07/07/2019 1138   CL 108 07/07/2019 1138   CO2 25 07/07/2019 1138   GLUCOSE 97 07/07/2019 1138   BUN 11 07/07/2019 1138   CREATININE 0.60 07/07/2019 1138   CALCIUM 9.1 07/07/2019 1138   PROT 7.4 07/07/2019 1138   ALBUMIN 4.2 07/07/2019 1138   AST 35 07/07/2019 1138   ALT 83 (H) 07/07/2019 1138   ALKPHOS 138 (H) 07/07/2019 1138   BILITOT 0.3 07/07/2019 1138   GFRNONAA >60 07/07/2019 1138   GFRAA >60 07/07/2019 1138    No results found for: TOTALPROTELP, ALBUMINELP, A1GS, A2GS, BETS, BETA2SER, GAMS, MSPIKE, SPEI  No results found for: KPAFRELGTCHN, LAMBDASER, KAPLAMBRATIO  Lab Results  Component Value Date   WBC 17.1 (  H) 07/21/2019   NEUTROABS 10.9 (H) 07/21/2019   HGB 8.7 (L) 07/21/2019   HCT 25.8 (L) 07/21/2019   MCV 92.8 07/21/2019   PLT 177 07/21/2019    No results found for: LABCA2  No components found for: PYKDXI338  No results for input(s): INR in the last 168 hours.  No results found for: LABCA2  No results found for: SNK539  No results found for: JQB341  No results found for: PFX902  No results found for: CA2729  No components found for: HGQUANT  No results found for: CEA1 / No results found for: CEA1   No results found for: AFPTUMOR  No results found for: CHROMOGRNA  No results found for: HGBA, HGBA2QUANT, HGBFQUANT, HGBSQUAN (Hemoglobinopathy evaluation)   No results found for: LDH  No results found for: IRON, TIBC, IRONPCTSAT (Iron and TIBC)  No results found for: FERRITIN  Urinalysis No results found for: COLORURINE, APPEARANCEUR, LABSPEC, PHURINE, GLUCOSEU, HGBUR, BILIRUBINUR, KETONESUR, PROTEINUR, UROBILINOGEN, NITRITE,  LEUKOCYTESUR   STUDIES: No results found.   ELIGIBLE FOR AVAILABLE RESEARCH PROTOCOL:no  ASSESSMENT: 29 y.o. BRCA1 positive [name] woman status post right breast overlapping sites biopsy 05/04/2019 for a clinical T3 N0, stage IIIB invasive ductal carcinoma, grade 2, triple negative, with an MIB-1 of 40%.  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  The Common Hereditary Gene Panel offered by [facility] includes sequencing and/or deletion duplication testing of the following 48 genes: APC, ATM, AXIN2, BARD1, BMPR1A, BRCA1, BRCA2, BRIP1, CDH1, CDK4, CDKN2A (p14ARF), CDKN2A (p16INK4a), CHEK2, CTNNA1, DICER1, EPCAM (Deletion/duplication testing only), GREM1 (promoter region deletion/duplication testing only), KIT, MEN1, MLH1, MSH2, MSH3, MSH6, MUTYH, NBN, NF1, NHTL1, PALB2, PDGFRA, PMS2, POLD1, POLE, PTEN, RAD50, RAD51C, RAD51D, RNF43, SDHB, SDHC, SDHD, SMAD4, SMARCA4. STK11, TP53, TSC1, TSC2, and VHL.  The following genes were evaluated for sequence changes only: SDHA and HOXB13 c.251G>A variant only. The report date is 05/24/2019.  (2) neoadjuvant chemotherapy consisting of doxorubicin and cyclophosphamide in dose dense fashion x4 started 05/26/2019 to be followed by paclitaxel and carboplatin weekly x12  (3) definitive surgery to follow  (4) adjuvant radiation to follow  (5) consider bilateral salpingo-oophorectomy at the end of breast cancer treatment   PLAN: Betul did remarkably well with the more intense portion of her chemotherapy.  Unfortunately there is still palpable disease in the right breast.  I am hoping this will be cleared with the remaining chemotherapy, which will start today.  She understands the possible side effects toxicities and complications of carboplatin and paclitaxel, which  we have previously discussed, and we will be reviewing peripheral neuropathy issues beginning next week with each dose.  I asked her to take Compazine tonight and tomorrow morning and then as needed.  I refilled that medication for her.  I have written her notes that she is able to get back to work.  She did not receive her goserelin last month because of insurance issues.  I am hopeful we can resume it today.  Once she is done with the breast cancer issue we will have to decide what to do regarding the ovarian cancer risk  She knows to call for any other issue that may develop before the next visit.  Total encounter time 25 minutes.[name] Jews C. Kasheem Toner, MD   07/21/2019 11:20 AM Medical Oncology and Hematology [facility]  [place], [place] [address] Tel. [phone]    Fax. [phone]   I, Wilburn Mylar, am acting as scribe for Dr. [name]. Daphine Loch.  I, Lurline Del MD, have reviewed the above documentation for accuracy and completeness, and I agree with the above.    *Total Encounter Time as defined by the Centers for Medicare and Medicaid Services includes, in addition to the face-to-face time of a patient visit (documented in the note above) non-face-to-face time: obtaining and reviewing outside history, ordering and reviewing medications, tests or procedures, care coordination (communications with other health care professionals or caregivers) and documentation in the medical record.

## 2019-07-21 ENCOUNTER — Inpatient Hospital Stay: Payer: No Typology Code available for payment source

## 2019-07-21 ENCOUNTER — Encounter: Payer: Self-pay | Admitting: General Practice

## 2019-07-21 ENCOUNTER — Inpatient Hospital Stay: Payer: No Typology Code available for payment source | Attending: Oncology

## 2019-07-21 ENCOUNTER — Inpatient Hospital Stay (HOSPITAL_BASED_OUTPATIENT_CLINIC_OR_DEPARTMENT_OTHER): Payer: Self-pay | Admitting: Oncology

## 2019-07-21 ENCOUNTER — Other Ambulatory Visit: Payer: Self-pay

## 2019-07-21 VITALS — BP 108/75 | HR 74 | Temp 98.2°F | Resp 18

## 2019-07-21 VITALS — BP 110/74 | HR 86 | Temp 98.5°F | Resp 20 | Ht 68.0 in | Wt 154.4 lb

## 2019-07-21 DIAGNOSIS — Z171 Estrogen receptor negative status [ER-]: Secondary | ICD-10-CM | POA: Insufficient documentation

## 2019-07-21 DIAGNOSIS — Z79818 Long term (current) use of other agents affecting estrogen receptors and estrogen levels: Secondary | ICD-10-CM | POA: Insufficient documentation

## 2019-07-21 DIAGNOSIS — Z1502 Genetic susceptibility to malignant neoplasm of ovary: Secondary | ICD-10-CM

## 2019-07-21 DIAGNOSIS — Z1501 Genetic susceptibility to malignant neoplasm of breast: Secondary | ICD-10-CM | POA: Insufficient documentation

## 2019-07-21 DIAGNOSIS — C50811 Malignant neoplasm of overlapping sites of right female breast: Secondary | ICD-10-CM | POA: Insufficient documentation

## 2019-07-21 DIAGNOSIS — Z803 Family history of malignant neoplasm of breast: Secondary | ICD-10-CM | POA: Insufficient documentation

## 2019-07-21 DIAGNOSIS — K760 Fatty (change of) liver, not elsewhere classified: Secondary | ICD-10-CM

## 2019-07-21 DIAGNOSIS — Z79899 Other long term (current) drug therapy: Secondary | ICD-10-CM | POA: Insufficient documentation

## 2019-07-21 DIAGNOSIS — Z5111 Encounter for antineoplastic chemotherapy: Secondary | ICD-10-CM | POA: Insufficient documentation

## 2019-07-21 LAB — CMP (CANCER CENTER ONLY)
ALT: 58 U/L — ABNORMAL HIGH (ref 0–44)
AST: 29 U/L (ref 15–41)
Albumin: 4.2 g/dL (ref 3.5–5.0)
Alkaline Phosphatase: 120 U/L (ref 38–126)
Anion gap: 7 (ref 5–15)
BUN: 8 mg/dL (ref 6–20)
CO2: 26 mmol/L (ref 22–32)
Calcium: 9.3 mg/dL (ref 8.9–10.3)
Chloride: 106 mmol/L (ref 98–111)
Creatinine: 0.45 mg/dL (ref 0.44–1.00)
GFR, Est AFR Am: >60 mL/min (ref >60)
GFR, Estimated: >60 mL/min (ref >60)
Glucose, Bld: 101 mg/dL — ABNORMAL HIGH (ref 70–99)
Potassium: 4 mmol/L (ref 3.5–5.1)
Sodium: 139 mmol/L (ref 135–145)
Total Bilirubin: 0.3 mg/dL (ref 0.3–1.2)
Total Protein: 7.2 g/dL (ref 6.5–8.1)

## 2019-07-21 LAB — CBC WITH DIFFERENTIAL/PLATELET
Abs Immature Granulocytes: 3.4 10*3/uL — ABNORMAL HIGH (ref 0.00–0.07)
Basophils Absolute: 0.1 10*3/uL (ref 0.0–0.1)
Basophils Relative: 1 %
Eosinophils Absolute: 0.1 10*3/uL (ref 0.0–0.5)
Eosinophils Relative: 1 %
HCT: 25.8 % — ABNORMAL LOW (ref 36.0–46.0)
Hemoglobin: 8.7 g/dL — ABNORMAL LOW (ref 12.0–15.0)
Immature Granulocytes: 20 %
Lymphocytes Relative: 8 %
Lymphs Abs: 1.3 10*3/uL (ref 0.7–4.0)
MCH: 31.3 pg (ref 26.0–34.0)
MCHC: 33.7 g/dL (ref 30.0–36.0)
MCV: 92.8 fL (ref 80.0–100.0)
Monocytes Absolute: 1.2 10*3/uL — ABNORMAL HIGH (ref 0.1–1.0)
Monocytes Relative: 7 %
Neutro Abs: 10.9 10*3/uL — ABNORMAL HIGH (ref 1.7–7.7)
Neutrophils Relative %: 63 %
Platelets: 177 10*3/uL (ref 150–400)
RBC: 2.78 MIL/uL — ABNORMAL LOW (ref 3.87–5.11)
RDW: 15.8 % — ABNORMAL HIGH (ref 11.5–15.5)
WBC: 17.1 10*3/uL — ABNORMAL HIGH (ref 4.0–10.5)
nRBC: 0.6 % — ABNORMAL HIGH (ref 0.0–0.2)

## 2019-07-21 LAB — PREGNANCY, URINE: Preg Test, Ur: NEGATIVE

## 2019-07-21 MED ORDER — SODIUM CHLORIDE 0.9 % IV SOLN
20.0000 mg | Freq: Once | INTRAVENOUS | Status: AC
  Administered 2019-07-21: 12:00:00 20 mg via INTRAVENOUS
  Filled 2019-07-21: qty 20

## 2019-07-21 MED ORDER — DIPHENHYDRAMINE HCL 50 MG/ML IJ SOLN
25.0000 mg | Freq: Once | INTRAMUSCULAR | Status: AC
  Administered 2019-07-21: 12:00:00 25 mg via INTRAVENOUS

## 2019-07-21 MED ORDER — SODIUM CHLORIDE 0.9 % IV SOLN
Freq: Once | INTRAVENOUS | Status: AC
  Filled 2019-07-21: qty 250

## 2019-07-21 MED ORDER — FAMOTIDINE IN NACL 20-0.9 MG/50ML-% IV SOLN
INTRAVENOUS | Status: AC
  Filled 2019-07-21: qty 50

## 2019-07-21 MED ORDER — LIDOCAINE-PRILOCAINE 2.5-2.5 % EX CREA: TOPICAL_CREAM | CUTANEOUS | 3 refills | Status: DC

## 2019-07-21 MED ORDER — SODIUM CHLORIDE 0.9 % IV SOLN
80.0000 mg/m2 | Freq: Once | INTRAVENOUS | Status: AC
  Administered 2019-07-21: 13:00:00 150 mg via INTRAVENOUS
  Filled 2019-07-21: qty 25

## 2019-07-21 MED ORDER — PALONOSETRON HCL INJECTION 0.25 MG/5ML
INTRAVENOUS | Status: AC
  Filled 2019-07-21: qty 5

## 2019-07-21 MED ORDER — HEPARIN SOD (PORK) LOCK FLUSH 100 UNIT/ML IV SOLN
500.0000 [IU] | Freq: Once | INTRAVENOUS | Status: AC | PRN
  Administered 2019-07-21: 500 [IU]
  Filled 2019-07-21: qty 5

## 2019-07-21 MED ORDER — DIPHENHYDRAMINE HCL 50 MG/ML IJ SOLN
INTRAMUSCULAR | Status: AC
  Filled 2019-07-21: qty 1

## 2019-07-21 MED ORDER — SODIUM CHLORIDE 0.9 % IV SOLN
287.8000 mg | Freq: Once | INTRAVENOUS | Status: AC
  Administered 2019-07-21: 15:00:00 290 mg via INTRAVENOUS
  Filled 2019-07-21: qty 29

## 2019-07-21 MED ORDER — PROCHLORPERAZINE MALEATE 10 MG PO TABS: 10.0000 mg | ORAL_TABLET | Freq: Four times a day (QID) | ORAL | 1 refills | Status: DC | PRN

## 2019-07-21 MED ORDER — SODIUM CHLORIDE 0.9% FLUSH
10.0000 mL | INTRAVENOUS | Status: DC | PRN
  Administered 2019-07-21: 10 mL
  Filled 2019-07-21: qty 10

## 2019-07-21 MED ORDER — PALONOSETRON HCL INJECTION 0.25 MG/5ML
0.2500 mg | Freq: Once | INTRAVENOUS | Status: AC
  Administered 2019-07-21: 0.25 mg via INTRAVENOUS

## 2019-07-21 MED ORDER — FAMOTIDINE IN NACL 20-0.9 MG/50ML-% IV SOLN
20.0000 mg | Freq: Once | INTRAVENOUS | Status: AC
  Administered 2019-07-21: 12:00:00 20 mg via INTRAVENOUS

## 2019-07-21 NOTE — Patient Instructions (Signed)
[facility] °Discharge Instructions for Patients Receiving Chemotherapy ° °Today you received the following chemotherapy agents Taxol, Carboplatin ° °To help prevent nausea and vomiting after your treatment, we encourage you to take your nausea medication DO NOT TAKE ZOFRAN FOR THREE DAYS AFTER TREATMENT. °  °If you develop nausea and vomiting that is not controlled by your nausea medication, call the clinic.  ° °BELOW ARE SYMPTOMS THAT SHOULD BE REPORTED IMMEDIATELY: °· *FEVER GREATER THAN 100.5 F °· *CHILLS WITH OR WITHOUT FEVER °· NAUSEA AND VOMITING THAT IS NOT CONTROLLED WITH YOUR NAUSEA MEDICATION °· *UNUSUAL SHORTNESS OF BREATH °· *UNUSUAL BRUISING OR BLEEDING °· TENDERNESS IN MOUTH AND THROAT WITH OR WITHOUT PRESENCE OF ULCERS °· *URINARY PROBLEMS °· *BOWEL PROBLEMS °· UNUSUAL RASH °Items with * indicate a potential emergency and should be followed up as soon as possible. ° °Feel free to call the clinic should you have any questions or concerns. The clinic phone number is [phone]. ° °Please show the CHEMO ALERT CARD at check-in to the Emergency Department and triage nurse. ° °Paclitaxel injection °What is this medicine? °PACLITAXEL (PAK li TAX el) is a chemotherapy drug. It targets fast dividing cells, like cancer cells, and causes these cells to die. This medicine is used to treat ovarian cancer, breast cancer, lung cancer, Kaposi's sarcoma, and other cancers. °This medicine may be used for other purposes; ask your health care provider or pharmacist if you have questions. °COMMON BRAND NAME(S): Onxol, Taxol °What should I tell my health care provider before I take this medicine? °They need to know if you have any of these conditions: °· history of irregular heartbeat °· liver disease °· low blood counts, like low white cell, platelet, or red cell counts °· lung or breathing disease, like asthma °· tingling of the fingers or toes, or other nerve disorder °· an unusual or allergic  reaction to paclitaxel, alcohol, polyoxyethylated castor oil, other chemotherapy, other medicines, foods, dyes, or preservatives °· pregnant or trying to get pregnant °· breast-feeding °How should I use this medicine? °This drug is given as an infusion into a vein. It is administered in a hospital or clinic by a specially trained health care professional. °Talk to your pediatrician regarding the use of this medicine in children. Special care may be needed. °Overdosage: If you think you have taken too much of this medicine contact a poison control center or emergency room at once. °NOTE: This medicine is only for you. Do not share this medicine with others. °What if I miss a dose? °It is important not to miss your dose. Call your doctor or health care professional if you are unable to keep an appointment. °What may interact with this medicine? °Do not take this medicine with any of the following medications: °· disulfiram °· metronidazole °This medicine may also interact with the following medications: °· antiviral medicines for hepatitis, HIV or AIDS °· certain antibiotics like erythromycin and clarithromycin °· certain medicines for fungal infections like ketoconazole and itraconazole °· certain medicines for seizures like carbamazepine, phenobarbital, phenytoin °· gemfibrozil °· nefazodone °· rifampin °· St. John's wort °This list may not describe all possible interactions. Give your health care provider a list of all the medicines, herbs, non-prescription drugs, or dietary supplements you use. Also tell them if you smoke, drink alcohol, or use illegal drugs. Some items may interact with your medicine. °What should I watch for while using this medicine? °Your condition will be monitored carefully while you are receiving   this medicine. You will need important blood work done while you are taking this medicine. °This medicine can cause serious allergic reactions. To reduce your risk you will need to take other  medicine(s) before treatment with this medicine. If you experience allergic reactions like skin rash, itching or hives, swelling of the face, lips, or tongue, tell your doctor or health care professional right away. °In some cases, you may be given additional medicines to help with side effects. Follow all directions for their use. °This drug may make you feel generally unwell. This is not uncommon, as chemotherapy can affect healthy cells as well as cancer cells. Report any side effects. Continue your course of treatment even though you feel ill unless your doctor tells you to stop. °Call your doctor or health care professional for advice if you get a fever, chills or sore throat, or other symptoms of a cold or flu. Do not treat yourself. This drug decreases your body's ability to fight infections. Try to avoid being around people who are sick. °This medicine may increase your risk to bruise or bleed. Call your doctor or health care professional if you notice any unusual bleeding. °Be careful brushing and flossing your teeth or using a toothpick because you may get an infection or bleed more easily. If you have any dental work done, tell your dentist you are receiving this medicine. °Avoid taking products that contain aspirin, acetaminophen, ibuprofen, naproxen, or ketoprofen unless instructed by your doctor. These medicines may hide a fever. °Do not become pregnant while taking this medicine. Women should inform their doctor if they wish to become pregnant or think they might be pregnant. There is a potential for serious side effects to an unborn child. Talk to your health care professional or pharmacist for more information. Do not breast-feed an infant while taking this medicine. °Men are advised not to father a child while receiving this medicine. °This product may contain alcohol. Ask your pharmacist or healthcare provider if this medicine contains alcohol. Be sure to tell all healthcare providers you are  taking this medicine. Certain medicines, like metronidazole and disulfiram, can cause an unpleasant reaction when taken with alcohol. The reaction includes flushing, headache, nausea, vomiting, sweating, and increased thirst. The reaction can last from 30 minutes to several hours. °What side effects may I notice from receiving this medicine? °Side effects that you should report to your doctor or health care professional as soon as possible: °· allergic reactions like skin rash, itching or hives, swelling of the face, lips, or tongue °· breathing problems °· changes in vision °· fast, irregular heartbeat °· high or low blood pressure °· mouth sores °· pain, tingling, numbness in the hands or feet °· signs of decreased platelets or bleeding - bruising, pinpoint red spots on the skin, black, tarry stools, blood in the urine °· signs of decreased red blood cells - unusually weak or tired, feeling faint or lightheaded, falls °· signs of infection - fever or chills, cough, sore throat, pain or difficulty passing urine °· signs and symptoms of liver injury like dark yellow or brown urine; general ill feeling or flu-like symptoms; light-colored stools; loss of appetite; nausea; right upper belly pain; unusually weak or tired; yellowing of the eyes or skin °· swelling of the ankles, feet, hands °· unusually slow heartbeat °Side effects that usually do not require medical attention (report to your doctor or health care professional if they continue or are bothersome): °· diarrhea °· hair loss °· loss   of appetite °· muscle or joint pain °· nausea, vomiting °· pain, redness, or irritation at site where injected °· tiredness °This list may not describe all possible side effects. Call your doctor for medical advice about side effects. You may report side effects to FDA at 1-800-FDA-1088. °Where should I keep my medicine? °This drug is given in a hospital or clinic and will not be stored at home. °NOTE: This sheet is a summary. It  may not cover all possible information. If you have questions about this medicine, talk to your doctor, pharmacist, or health care provider. °© 2020 [employer] (2017-02-03 13:14:55) °Carboplatin injection °What is this medicine? °CARBOPLATIN (KAR boe pla tin) is a chemotherapy drug. It targets fast dividing cells, like cancer cells, and causes these cells to die. This medicine is used to treat ovarian cancer and many other cancers. °This medicine may be used for other purposes; ask your health care provider or pharmacist if you have questions. °COMMON BRAND NAME(S): Paraplatin °What should I tell my health care provider before I take this medicine? °They need to know if you have any of these conditions: °· blood disorders °· hearing problems °· kidney disease °· recent or ongoing radiation therapy °· an unusual or allergic reaction to carboplatin, cisplatin, other chemotherapy, other medicines, foods, dyes, or preservatives °· pregnant or trying to get pregnant °· breast-feeding °How should I use this medicine? °This drug is usually given as an infusion into a vein. It is administered in a hospital or clinic by a specially trained health care professional. °Talk to your pediatrician regarding the use of this medicine in children. Special care may be needed. °Overdosage: If you think you have taken too much of this medicine contact a poison control center or emergency room at once. °NOTE: This medicine is only for you. Do not share this medicine with others. °What if I miss a dose? °It is important not to miss a dose. Call your doctor or health care professional if you are unable to keep an appointment. °What may interact with this medicine? °· medicines for seizures °· medicines to increase blood counts like filgrastim, pegfilgrastim, sargramostim °· some antibiotics like amikacin, gentamicin, neomycin, streptomycin, tobramycin °· vaccines °Talk to your doctor or health care professional before taking  any of these medicines: °· acetaminophen °· aspirin °· ibuprofen °· ketoprofen °· naproxen °This list may not describe all possible interactions. Give your health care provider a list of all the medicines, herbs, non-prescription drugs, or dietary supplements you use. Also tell them if you smoke, drink alcohol, or use illegal drugs. Some items may interact with your medicine. °What should I watch for while using this medicine? °Your condition will be monitored carefully while you are receiving this medicine. You will need important blood work done while you are taking this medicine. °This drug may make you feel generally unwell. This is not uncommon, as chemotherapy can affect healthy cells as well as cancer cells. Report any side effects. Continue your course of treatment even though you feel ill unless your doctor tells you to stop. °In some cases, you may be given additional medicines to help with side effects. Follow all directions for their use. °Call your doctor or health care professional for advice if you get a fever, chills or sore throat, or other symptoms of a cold or flu. Do not treat yourself. This drug decreases your body's ability to fight infections. Try to avoid being around people who are sick. °This medicine may   increase your risk to bruise or bleed. Call your doctor or health care professional if you notice any unusual bleeding. Be careful brushing and flossing your teeth or using a toothpick because you may get an infection or bleed more easily. If you have any dental work done, tell your dentist you are receiving this medicine. Avoid taking products that contain aspirin, acetaminophen, ibuprofen, naproxen, or ketoprofen unless instructed by your doctor. These medicines may hide a fever. Do not become pregnant while taking this medicine. Women should inform their doctor if they wish to become pregnant or think they might be pregnant. There is a potential for serious side effects to an unborn  child. Talk to your health care professional or pharmacist for more information. Do not breast-feed an infant while taking this medicine. What side effects may I notice from receiving this medicine? Side effects that you should report to your doctor or health care professional as soon as possible:  allergic reactions like skin rash, itching or hives, swelling of the face, lips, or tongue  signs of infection - fever or chills, cough, sore throat, pain or difficulty passing urine  signs of decreased platelets or bleeding - bruising, pinpoint red spots on the skin, black, tarry stools, nosebleeds  signs of decreased red blood cells - unusually weak or tired, fainting spells, lightheadedness  breathing problems  changes in hearing  changes in vision  chest pain  high blood pressure  low blood counts - This drug may decrease the number of white blood cells, red blood cells and platelets. You may be at increased risk for infections and bleeding.  nausea and vomiting  pain, swelling, redness or irritation at the injection site  pain, tingling, numbness in the hands or feet  problems with balance, talking, walking  trouble passing urine or change in the amount of urine Side effects that usually do not require medical attention (report to your doctor or health care professional if they continue or are bothersome):  hair loss  loss of appetite  metallic taste in the mouth or changes in taste This list may not describe all possible side effects. Call your doctor for medical advice about side effects. You may report side effects to FDA at 1-800-FDA-1088. Where should I keep my medicine? This drug is given in a hospital or clinic and will not be stored at home. NOTE: This sheet is a summary. It may not cover all possible information. If you have questions about this medicine, talk to your doctor, pharmacist, or health care provider.  2020 [employer] (2007-09-07  14:38:05)

## 2019-07-21 NOTE — Progress Notes (Signed)
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  She has assisted patient w completing/submitting Medicaid application to DSS.  They are awaiting DSS decision on Medicaid eligibility.    Edwyna Shell, LCSW Clinical Social Worker Phone:  [phone]

## 2019-07-21 NOTE — Progress Notes (Signed)
First time Taxol and Carboplatin today. Interpreter present. Questions translated. English information given (spanish material not available).

## 2019-07-27 ENCOUNTER — Other Ambulatory Visit: Payer: Self-pay | Admitting: Oncology

## 2019-07-27 NOTE — Progress Notes (Signed)
[place]  [phone] [phone]     ID: Brianna Owens DOB: 14-Oct-1990  MR#: [id number]  UXN#:[id number]  Patient Care Team: Patient, [name] as PCP - General (General Practice) Jessenia Filippone, [name], MD as Consulting Physician (Oncology) Erroll Luna, MD as Consulting Physician (General Surgery) [name], RN as Oncology Nurse Navigator [name], RN as Oncology Nurse Navigator Chauncey Cruel, MD OTHER MD:  CHIEF COMPLAINT: Triple negative breast cancer, BRCA1+  CURRENT TREATMENT: neoadjuvant chemotherapy   INTERVAL HISTORY: Brianna Owens returns today for follow up and treatment of her triple negative breast cancer accompanied by a translator.    She received her first dose of weekly paclitacxel and carboplatin on 07/21/2019.  She tolerated this remarkably well.  She had no nausea, no mouth sores, no fatigue.  Her port continues to work well.  Her most recent echocardiogram on 05/25/2019 showed an ejection fraction of 55-60%.   REVIEW OF SYSTEMS: Brianna Owens is very concerned because her and [place] who lives with her in [place] has stage IV breast cancer.  She is being treated through [facility].  Brianna Owens tells me her and has been told that this chemo she is receiving is the last one that she will receive and if that does not work then she will be referred to hospice.  The patient tells me her mother who lives in [place] is also very worried that Brianna Owens is going to die from this cancer.  Aside from these issues a detailed review of systems today was noncontributory and in particular has been no peripheral neuropathy.  HISTORY OF CURRENT ILLNESS: From the original intake note:  Brianna Owens [facility] with a 3 month history of a right breast lump that became painful in early 04/2019. Physical exam performed at that time showed a palpable, tender 13  cm lump within the right center breast under the nipple area. She underwent right breast ultrasonography at [facility] on 04/27/2019 showing: 6.1 cm palpable mass centered in the 5 o'clock position of the right breast; single right inferomedial axillary lymph node with focal cortical thickening inferiorly.  Accordingly on 05/04/2019 she proceeded to biopsy of the right breast area in question. The pathology from this procedure ([id number]) showed: invasive ductal carcinoma, grade 2-3. Prognostic indicators significant for: estrogen receptor, 0% negative and progesterone receptor, 0% negative. Proliferation marker Ki67 at 40%.  I do not find HER-2 receptor documentation  The right axillary lymph node biopsied at that time showed reactive germinal centers.  She underwent bilateral diagnostic mammography with tomography at [facility] on 05/05/2019 showing: breast density category D; biopsy-proven right breast malignancy measures 6.1 cm; no additional masses or calcifications seen in the right breast; no evidence of malignancy in the left breast.  The patient's subsequent history is as detailed below.   PAST MEDICAL HISTORY: No past medical history on file.  PAST SURGICAL HISTORY: Past Surgical History:  Procedure Laterality Date  . IR IMAGING GUIDED PORT INSERTION  05/24/2019    FAMILY HISTORY: Family History  Problem Relation Age of Onset  . Breast cancer Mother   . Breast cancer Maternal Aunt    Patient's father is 40 and her mother 83 as of November 2020.  The patient's mother was diagnosed with breast cancer in her early 63s.  She lives in [place]. The patient's mother's sister was also diagnosed with breast cancer in her early 32s.  The patient herself has 1 sister, no brothers.  She is not aware of any ovarian cancer cases in the family.   GYNECOLOGIC HISTORY:  No LMP recorded. Menarche: 29 years old Age at first live birth: 29 years old [place] P  2 LMPregular Contraceptive HRT n/a  Hysterectomy? no BSO? no   SOCIAL HISTORY: (updated 04/2019)  Brianna Owens is currently working at [employer].  She is originally from [place].  She is divorced.  Her children are [name], 41 years old, living in [place] with his father, and [name], 72 years old, who lives with the patient.  Also at home are her uncle [name], his wife, and that wife's cousin.     ADVANCED DIRECTIVES: Not in place   HEALTH MAINTENANCE: Social History   Tobacco Use  . Smoking status: Never Smoker  . Smokeless tobacco: Never Used  Substance Use Topics  . Alcohol use: Not Currently  . Drug use: Not Currently     Colonoscopy: n/a  PAP: 04/26/2019, negative  Bone density: n/a   No Known Allergies  Current Outpatient Medications  Medication Sig Dispense Refill  . lidocaine-prilocaine (EMLA) cream Apply to affected area once 30 g 3  . loratadine (CLARITIN) 10 MG tablet Take 1 tablet (10 mg total) by mouth daily. 60 tablet 0  . prochlorperazine (COMPAZINE) 10 MG tablet Take 1 tablet (10 mg total) by mouth every 6 (six) hours as needed (Nausea or vomiting). 30 tablet 1  . venlafaxine XR (EFFEXOR-XR) 75 MG 24 hr capsule Take 1 capsule (75 mg total) by mouth daily with breakfast. 30 capsule 4   No current facility-administered medications for this visit.    OBJECTIVE: Young Spanish speaker in no acute distress  Vitals:   07/28/19 1106  BP: 112/60  Pulse: 78  Resp: 18  Temp: 98.5 F (36.9 C)  SpO2: 100%     Body mass index is 23.43 kg/m.   Wt Readings from Last 3 Encounters:  07/28/19 154 lb 1.6 oz (69.9 kg)  07/21/19 154 lb 6.4 oz (70 kg)  07/07/19 155 lb 14.4 oz (70.7 kg)      ECOG FS:0 - Asymptomatic  Sclerae unicteric, EOMs intact Wearing a mask No cervical or supraclavicular adenopathy Lungs no rales or rhonchi Heart regular rate and rhythm Abd soft, nontender, positive bowel sounds MSK no focal spinal tenderness, no upper extremity  lymphedema Neuro: nonfocal, well oriented, appropriate affect Breasts: The mass in the right breast is still palpable in the inferior aspect of the breast just below the areola.  It measures approximately 1-1/2 cm, is movable, does not involve the skin or nipple.  Left breast is benign.  Both axillae are benign.   LAB RESULTS:  CMP     Component Value Date/Time   NA 139 07/21/2019 1025   K 4.0 07/21/2019 1025   CL 106 07/21/2019 1025   CO2 26 07/21/2019 1025   GLUCOSE 101 (H) 07/21/2019 1025   BUN 8 07/21/2019 1025   CREATININE 0.45 07/21/2019 1025   CALCIUM 9.3 07/21/2019 1025   PROT 7.2 07/21/2019 1025   ALBUMIN 4.2 07/21/2019 1025   AST 29 07/21/2019 1025   ALT 58 (H) 07/21/2019 1025   ALKPHOS 120 07/21/2019 1025   BILITOT 0.3 07/21/2019 1025   GFRNONAA >60 07/21/2019 1025   GFRAA >60 07/21/2019 1025    No results found for: TOTALPROTELP, ALBUMINELP, A1GS, A2GS, BETS, BETA2SER, GAMS, MSPIKE, SPEI  No results found for: KPAFRELGTCHN, LAMBDASER, KAPLAMBRATIO  Lab Results  Component Value Date   WBC 4.4 07/28/2019   NEUTROABS 2.8 07/28/2019   HGB 8.5 (L) 07/28/2019   HCT 24.9 (L) 07/28/2019   MCV 94.0 07/28/2019   PLT 322 07/28/2019    No results found for: LABCA2  No components found for: [id number]  No results for input(s): INR in the last 168 hours.  No results found for: LABCA2  No results found for: IZT245  No results found for: YKD983  No results found for: JAS505  No results found for: CA2729  No components found for: HGQUANT  No results found for: CEA1 / No results found for: CEA1   No results found for: AFPTUMOR  No results found for: CHROMOGRNA  No results found for: HGBA, HGBA2QUANT, HGBFQUANT, HGBSQUAN (Hemoglobinopathy evaluation)   No results found for: LDH  No results found for: IRON, TIBC, IRONPCTSAT (Iron and TIBC)  No results found for: FERRITIN  Urinalysis No results found for: COLORURINE, APPEARANCEUR, LABSPEC, PHURINE,  GLUCOSEU, HGBUR, BILIRUBINUR, KETONESUR, PROTEINUR, UROBILINOGEN, NITRITE, LEUKOCYTESUR   STUDIES: No results found.   ELIGIBLE FOR AVAILABLE RESEARCH PROTOCOL:no  ASSESSMENT: 29 y.o. BRCA1 positive Brianna Owens woman status post right breast overlapping sites biopsy 05/04/2019 for a clinical T3 N0, stage IIIB invasive ductal carcinoma, grade 2, triple negative, with an MIB-1 of 40%.  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  The Common Hereditary Gene Panel offered by [facility] includes sequencing and/or deletion duplication testing of the following 48 genes: APC, ATM, AXIN2, BARD1, BMPR1A, BRCA1, BRCA2, BRIP1, CDH1, CDK4, CDKN2A (p14ARF), CDKN2A (p16INK4a), CHEK2, CTNNA1, DICER1, EPCAM (Deletion/duplication testing only), GREM1 (promoter region deletion/duplication testing only), KIT, MEN1, MLH1, MSH2, MSH3, MSH6, MUTYH, NBN, NF1, NHTL1, PALB2, PDGFRA, PMS2, POLD1, POLE, PTEN, RAD50, RAD51C, RAD51D, RNF43, SDHB, SDHC, SDHD, SMAD4, SMARCA4. STK11, TP53, TSC1, TSC2, and VHL.  The following genes were evaluated for sequence changes only: SDHA and HOXB13 c.251G>A variant only. The report date is 05/24/2019.  (2) neoadjuvant chemotherapy consisting of doxorubicin and cyclophosphamide in dose dense fashion x4 started 05/26/2019 to be followed by paclitaxel and carboplatin weekly x12  (3) definitive surgery to follow  (4) adjuvant radiation to follow  (5) consider bilateral salpingo-oophorectomy at the end of breast cancer treatment   PLAN: Brianna Owens is tolerating the Taxol/carboplatin treatment remarkably well.  The plan is to continue that weekly until she has a total of 12 doses.  We again reviewed peripheral neuropathy concerns.  So far there has been no symptoms suggesting that.  She is appropriately  concerned because of her aunt who looks like she is going to not do very well with her stage IV breast cancer.  In fact the [facility] folks are telling her that she may go under hospice care in the next few months.  This is of course a big concern to the patient and her mother.  Today I wrote the entire treatment plan out for Brianna Owens so she could share with her mother and understand that the plan here is for cure  She is planning to get back to work.  If we treat her on Thursday she could work Monday Tuesday Wednesday and if she wants to switch to Friday then she could work up to 4 days a week  Total encounter time 25 minutes.[name] Jews C. Trajan Grove, MD   07/28/2019 11:20 AM Medical Oncology and Hematology  [facility] [place], [place] [address] Tel. [phone]    Fax. [phone]   I, Wilburn Mylar, am acting as scribe for Dr. [name]. Kegan Mckeithan.  I, Lurline Del MD, have reviewed the above documentation for accuracy and completeness, and I agree with the above.    *Total Encounter Time as defined by the Centers for Medicare and Medicaid Services includes, in addition to the face-to-face time of a patient visit (documented in the note above) non-face-to-face time: obtaining and reviewing outside history, ordering and reviewing medications, tests or procedures, care coordination (communications with other health care professionals or caregivers) and documentation in the medical record.

## 2019-07-28 ENCOUNTER — Other Ambulatory Visit: Payer: No Typology Code available for payment source

## 2019-07-28 ENCOUNTER — Inpatient Hospital Stay: Payer: No Typology Code available for payment source

## 2019-07-28 ENCOUNTER — Other Ambulatory Visit: Payer: Self-pay

## 2019-07-28 ENCOUNTER — Ambulatory Visit: Payer: No Typology Code available for payment source | Admitting: Adult Health

## 2019-07-28 ENCOUNTER — Inpatient Hospital Stay (HOSPITAL_BASED_OUTPATIENT_CLINIC_OR_DEPARTMENT_OTHER): Payer: No Typology Code available for payment source | Admitting: Oncology

## 2019-07-28 VITALS — BP 112/60 | HR 78 | Temp 98.5°F | Resp 18 | Ht 68.0 in | Wt 154.1 lb

## 2019-07-28 DIAGNOSIS — Z1501 Genetic susceptibility to malignant neoplasm of breast: Secondary | ICD-10-CM

## 2019-07-28 DIAGNOSIS — Z95828 Presence of other vascular implants and grafts: Secondary | ICD-10-CM

## 2019-07-28 DIAGNOSIS — C50811 Malignant neoplasm of overlapping sites of right female breast: Secondary | ICD-10-CM

## 2019-07-28 DIAGNOSIS — K760 Fatty (change of) liver, not elsewhere classified: Secondary | ICD-10-CM

## 2019-07-28 DIAGNOSIS — Z171 Estrogen receptor negative status [ER-]: Secondary | ICD-10-CM

## 2019-07-28 DIAGNOSIS — Z01419 Encounter for gynecological examination (general) (routine) without abnormal findings: Secondary | ICD-10-CM

## 2019-07-28 DIAGNOSIS — Z1502 Genetic susceptibility to malignant neoplasm of ovary: Secondary | ICD-10-CM

## 2019-07-28 LAB — CBC WITH DIFFERENTIAL/PLATELET
Abs Immature Granulocytes: 0.03 10*3/uL (ref 0.00–0.07)
Basophils Absolute: 0 10*3/uL (ref 0.0–0.1)
Basophils Relative: 1 %
Eosinophils Absolute: 0 10*3/uL (ref 0.0–0.5)
Eosinophils Relative: 1 %
HCT: 24.9 % — ABNORMAL LOW (ref 36.0–46.0)
Hemoglobin: 8.5 g/dL — ABNORMAL LOW (ref 12.0–15.0)
Immature Granulocytes: 1 %
Lymphocytes Relative: 23 %
Lymphs Abs: 1 10*3/uL (ref 0.7–4.0)
MCH: 32.1 pg (ref 26.0–34.0)
MCHC: 34.1 g/dL (ref 30.0–36.0)
MCV: 94 fL (ref 80.0–100.0)
Monocytes Absolute: 0.5 10*3/uL (ref 0.1–1.0)
Monocytes Relative: 11 %
Neutro Abs: 2.8 10*3/uL (ref 1.7–7.7)
Neutrophils Relative %: 63 %
Platelets: 322 10*3/uL (ref 150–400)
RBC: 2.65 MIL/uL — ABNORMAL LOW (ref 3.87–5.11)
RDW: 16.8 % — ABNORMAL HIGH (ref 11.5–15.5)
WBC: 4.4 10*3/uL (ref 4.0–10.5)
nRBC: 0.5 % — ABNORMAL HIGH (ref 0.0–0.2)

## 2019-07-28 LAB — COMPREHENSIVE METABOLIC PANEL
ALT: 109 U/L — ABNORMAL HIGH (ref 0–44)
AST: 51 U/L — ABNORMAL HIGH (ref 15–41)
Albumin: 4.1 g/dL (ref 3.5–5.0)
Alkaline Phosphatase: 100 U/L (ref 38–126)
Anion gap: 9 (ref 5–15)
BUN: 8 mg/dL (ref 6–20)
CO2: 25 mmol/L (ref 22–32)
Calcium: 9.2 mg/dL (ref 8.9–10.3)
Chloride: 108 mmol/L (ref 98–111)
Creatinine, Ser: 0.57 mg/dL (ref 0.44–1.00)
GFR calc Af Amer: >60 mL/min (ref >60)
GFR calc non Af Amer: >60 mL/min (ref >60)
Glucose, Bld: 96 mg/dL (ref 70–99)
Potassium: 4.1 mmol/L (ref 3.5–5.1)
Sodium: 142 mmol/L (ref 135–145)
Total Bilirubin: 0.3 mg/dL (ref 0.3–1.2)
Total Protein: 7.2 g/dL (ref 6.5–8.1)

## 2019-07-28 LAB — PREGNANCY, URINE: Preg Test, Ur: NEGATIVE

## 2019-07-28 MED ORDER — SODIUM CHLORIDE 0.9 % IV SOLN
Freq: Once | INTRAVENOUS | Status: AC
  Filled 2019-07-28: qty 250

## 2019-07-28 MED ORDER — FAMOTIDINE IN NACL 20-0.9 MG/50ML-% IV SOLN
20.0000 mg | Freq: Once | INTRAVENOUS | Status: AC
  Administered 2019-07-28: 13:00:00 20 mg via INTRAVENOUS

## 2019-07-28 MED ORDER — SODIUM CHLORIDE 0.9 % IV SOLN
80.0000 mg/m2 | Freq: Once | INTRAVENOUS | Status: AC
  Administered 2019-07-28: 14:00:00 150 mg via INTRAVENOUS
  Filled 2019-07-28: qty 25

## 2019-07-28 MED ORDER — SODIUM CHLORIDE 0.9% FLUSH
10.0000 mL | Freq: Once | INTRAVENOUS | Status: AC
  Administered 2019-07-28: 10 mL
  Filled 2019-07-28: qty 10

## 2019-07-28 MED ORDER — PALONOSETRON HCL INJECTION 0.25 MG/5ML
INTRAVENOUS | Status: AC
  Filled 2019-07-28: qty 5

## 2019-07-28 MED ORDER — HEPARIN SOD (PORK) LOCK FLUSH 100 UNIT/ML IV SOLN
500.0000 [IU] | Freq: Once | INTRAVENOUS | Status: AC | PRN
  Administered 2019-07-28: 15:00:00 500 [IU]
  Filled 2019-07-28: qty 5

## 2019-07-28 MED ORDER — DIPHENHYDRAMINE HCL 50 MG/ML IJ SOLN
INTRAMUSCULAR | Status: AC
  Filled 2019-07-28: qty 1

## 2019-07-28 MED ORDER — SODIUM CHLORIDE 0.9 % IV SOLN
287.8000 mg | Freq: Once | INTRAVENOUS | Status: AC
  Administered 2019-07-28: 15:00:00 290 mg via INTRAVENOUS
  Filled 2019-07-28: qty 29

## 2019-07-28 MED ORDER — SODIUM CHLORIDE 0.9% FLUSH
10.0000 mL | INTRAVENOUS | Status: DC | PRN
  Administered 2019-07-28: 15:00:00 10 mL
  Filled 2019-07-28: qty 10

## 2019-07-28 MED ORDER — DEXAMETHASONE SODIUM PHOSPHATE 10 MG/ML IJ SOLN
10.0000 mg | Freq: Once | INTRAMUSCULAR | Status: AC
  Administered 2019-07-28: 13:00:00 10 mg via INTRAVENOUS

## 2019-07-28 MED ORDER — DIPHENHYDRAMINE HCL 50 MG/ML IJ SOLN
25.0000 mg | Freq: Once | INTRAMUSCULAR | Status: AC
  Administered 2019-07-28: 12:00:00 25 mg via INTRAVENOUS

## 2019-07-28 MED ORDER — PALONOSETRON HCL INJECTION 0.25 MG/5ML
0.2500 mg | Freq: Once | INTRAVENOUS | Status: AC
  Administered 2019-07-28: 12:00:00 0.25 mg via INTRAVENOUS

## 2019-07-28 MED ORDER — FAMOTIDINE IN NACL 20-0.9 MG/50ML-% IV SOLN
INTRAVENOUS | Status: AC
  Filled 2019-07-28: qty 50

## 2019-07-28 NOTE — Progress Notes (Signed)
Per Dr. Jana Hakim, Patient is OK to treat with today's labs

## 2019-07-29 ENCOUNTER — Telehealth: Payer: Self-pay | Admitting: Oncology

## 2019-07-29 NOTE — Telephone Encounter (Signed)
I talk with patient regarding schedule  

## 2019-08-03 ENCOUNTER — Telehealth: Payer: Self-pay | Admitting: Oncology

## 2019-08-03 NOTE — Telephone Encounter (Signed)
I call interpreters talk with patient regarding reschedule due to weather

## 2019-08-04 ENCOUNTER — Inpatient Hospital Stay: Payer: No Typology Code available for payment source

## 2019-08-04 ENCOUNTER — Inpatient Hospital Stay: Payer: No Typology Code available for payment source | Admitting: Oncology

## 2019-08-04 NOTE — Progress Notes (Signed)
[place]  Telephone:(336) [phone] Fax:(336) [phone]     ID: Brianna Owens DOB: Jan 29, 1991  MR#: [id number]  VQQ#:[id number]  Patient Care Team: Patient, [name] as PCP - General (General Practice) Garvey Westcott, [name], MD as Consulting Physician (Oncology) Erroll Luna, MD as Consulting Physician (General Surgery) [name], RN as Oncology Nurse Navigator [name], RN as Oncology Nurse Navigator Chauncey Cruel, MD OTHER MD:  CHIEF COMPLAINT: Triple negative breast cancer, BRCA1 positive  CURRENT TREATMENT: neoadjuvant chemotherapy   INTERVAL HISTORY: Brianna Owens returns today for follow up and treatment of her triple negative breast cancer.  She continues on weekly paclitacxel and carboplatin. Today is her third dose.  She is doing quite well with this.  She is walking with her baby, working 1 or 2 days a week, has no altered taste no nausea or vomiting no mouth sores no problems with her port and generally feels very normal.  REVIEW OF SYSTEMS: Brianna Owens occasionally eats something that does not agree with her and she gets a little bellyache, but otherwise she is fine.  There have been no fevers rash bleeding, no menopausal symptoms, and no periods for many months.  A detailed review of systems today was otherwise stable.   HISTORY OF CURRENT ILLNESS: From the original intake note:  [place] presented to the [facility] with a 3 month history of a right breast lump that became painful in early 04/2019. Physical exam performed at that time showed a palpable, tender 13 cm lump within the right center breast under the nipple area. She underwent right breast ultrasonography at [facility] on 04/27/2019 showing: 6.1 cm palpable mass centered in the 5 o'clock position of the right breast; single right inferomedial axillary lymph node with focal cortical thickening  inferiorly.  Accordingly on 05/04/2019 she proceeded to biopsy of the right breast area in question. The pathology from this procedure ([id number]) showed: invasive ductal carcinoma, grade 2-3. Prognostic indicators significant for: estrogen receptor, 0% negative and progesterone receptor, 0% negative. Proliferation marker Ki67 at 40%.  I do not find HER-2 receptor documentation  The right axillary lymph node biopsied at that time showed reactive germinal centers.  She underwent bilateral diagnostic mammography with tomography at [facility] on 05/05/2019 showing: breast density category D; biopsy-proven right breast malignancy measures 6.1 cm; no additional masses or calcifications seen in the right breast; no evidence of malignancy in the left breast.  The patient's subsequent history is as detailed below.   PAST MEDICAL HISTORY: No past medical history on file.  PAST SURGICAL HISTORY: Past Surgical History:  Procedure Laterality Date  . IR IMAGING GUIDED PORT INSERTION  05/24/2019    FAMILY HISTORY: Family History  Problem Relation Age of Onset  . Breast cancer Mother   . Breast cancer Maternal Aunt    Patient's father is 73 and her mother 75 as of November 2020.  The patient's mother was diagnosed with breast cancer in her early 70s.  She lives in [place]. The patient's mother's sister was also diagnosed with breast cancer in her early 72s.  The patient herself has 1 sister, no brothers.  She is not aware of any ovarian cancer cases in the family.   GYNECOLOGIC HISTORY:  No LMP recorded. Menarche: 29 years old Age at first live birth: 29 years old [place] P 2 LMPregular Contraceptive HRT n/a  Hysterectomy? no BSO? no   SOCIAL HISTORY: (updated 04/2019)  Brianna Owens is currently working at [employer].  She is originally from [place].  She is divorced.  Her children are [name], 38 years old, living in [place] with his father, and [name], 73 years old, who lives with the patient.   Also at home are her uncle [name], his wife, and that wife's cousin.     ADVANCED DIRECTIVES: Not in place   HEALTH MAINTENANCE: Social History   Tobacco Use  . Smoking status: Never Smoker  . Smokeless tobacco: Never Used  Substance Use Topics  . Alcohol use: Not Currently  . Drug use: Not Currently     Colonoscopy: n/a  PAP: 04/26/2019, negative  Bone density: n/a   No Known Allergies  Current Outpatient Medications  Medication Sig Dispense Refill  . lidocaine-prilocaine (EMLA) cream Apply to affected area once 30 g 3  . loratadine (CLARITIN) 10 MG tablet Take 1 tablet (10 mg total) by mouth daily. 60 tablet 0  . prochlorperazine (COMPAZINE) 10 MG tablet Take 1 tablet (10 mg total) by mouth every 6 (six) hours as needed (Nausea or vomiting). 30 tablet 1  . venlafaxine XR (EFFEXOR-XR) 75 MG 24 hr capsule Take 1 capsule (75 mg total) by mouth daily with breakfast. 30 capsule 4   No current facility-administered medications for this visit.    OBJECTIVE: Young Spanish speaker who appears stated age  29:   08/05/19 1133  BP: 109/66  Pulse: 86  Resp: 18  Temp: 98 F (36.7 C)  SpO2: 100%     Body mass index is 23.63 kg/m.   Wt Readings from Last 3 Encounters:  08/05/19 155 lb 6.4 oz (70.5 kg)  07/28/19 154 lb 1.6 oz (69.9 kg)  07/21/19 154 lb 6.4 oz (70 kg)      ECOG FS:0 - Asymptomatic  Sclerae unicteric, EOMs intact Wearing a mask No cervical or supraclavicular adenopathy Lungs no rales or rhonchi Heart regular rate and rhythm Abd soft, nontender, positive bowel sounds MSK no focal spinal tenderness, no upper extremity lymphedema Neuro: nonfocal, well oriented, appropriate affect Breasts: The mass in the right breast is still palpable under the areola feeling from below, and measures perhaps 1 cm.  It is movable, with no skin involvement and no nipple involvement.  The left breast is benign.  Both axillae are benign.   LAB RESULTS:  CMP      Component Value Date/Time   NA 140 08/05/2019 1105   K 4.0 08/05/2019 1105   CL 108 08/05/2019 1105   CO2 24 08/05/2019 1105   GLUCOSE 115 (H) 08/05/2019 1105   BUN 7 08/05/2019 1105   CREATININE 0.57 08/05/2019 1105   CREATININE 0.45 07/21/2019 1025   CALCIUM 9.3 08/05/2019 1105   PROT 7.6 08/05/2019 1105   ALBUMIN 4.2 08/05/2019 1105   AST 55 (H) 08/05/2019 1105   AST 29 07/21/2019 1025   ALT 144 (H) 08/05/2019 1105   ALT 58 (H) 07/21/2019 1025   ALKPHOS 89 08/05/2019 1105   BILITOT 0.3 08/05/2019 1105   BILITOT 0.3 07/21/2019 1025   GFRNONAA >60 08/05/2019 1105   GFRNONAA >60 07/21/2019 1025   GFRAA >60 08/05/2019 1105   GFRAA >60 07/21/2019 1025    No results found for: TOTALPROTELP, ALBUMINELP, A1GS, A2GS, BETS, BETA2SER, GAMS, MSPIKE, SPEI  No results found for: KPAFRELGTCHN, LAMBDASER, KAPLAMBRATIO  Lab Results  Component Value Date   WBC 4.1 08/05/2019   NEUTROABS 2.4 08/05/2019   HGB 8.7 (L) 08/05/2019   HCT 25.9 (L)  08/05/2019   MCV 97.0 08/05/2019   PLT 244 08/05/2019    No results found for: LABCA2  No components found for: [id number]  No results for input(s): INR in the last 168 hours.  No results found for: LABCA2  No results found for: WGY659  No results found for: DJT701  No results found for: XBL390  No results found for: CA2729  No components found for: HGQUANT  No results found for: CEA1 / No results found for: CEA1   No results found for: AFPTUMOR  No results found for: CHROMOGRNA  No results found for: HGBA, HGBA2QUANT, HGBFQUANT, HGBSQUAN (Hemoglobinopathy evaluation)   No results found for: LDH  No results found for: IRON, TIBC, IRONPCTSAT (Iron and TIBC)  No results found for: FERRITIN  Urinalysis No results found for: COLORURINE, APPEARANCEUR, LABSPEC, PHURINE, GLUCOSEU, HGBUR, BILIRUBINUR, KETONESUR, PROTEINUR, UROBILINOGEN, NITRITE, LEUKOCYTESUR   STUDIES: No results found.   ELIGIBLE FOR AVAILABLE RESEARCH  PROTOCOL:no  ASSESSMENT: 29 y.o. BRCA1 positive Brianna Owens woman status post right breast overlapping sites biopsy 05/04/2019 for a clinical T3 N0, stage IIIB invasive ductal carcinoma, grade 2, triple negative, with an MIB-1 of 40%.  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  The Common Hereditary Gene Panel offered by [facility] includes sequencing and/or deletion duplication testing of the following 48 genes: APC, ATM, AXIN2, BARD1, BMPR1A, BRCA1, BRCA2, BRIP1, CDH1, CDK4, CDKN2A (p14ARF), CDKN2A (p16INK4a), CHEK2, CTNNA1, DICER1, EPCAM (Deletion/duplication testing only), GREM1 (promoter region deletion/duplication testing only), KIT, MEN1, MLH1, MSH2, MSH3, MSH6, MUTYH, NBN, NF1, NHTL1, PALB2, PDGFRA, PMS2, POLD1, POLE, PTEN, RAD50, RAD51C, RAD51D, RNF43, SDHB, SDHC, SDHD, SMAD4, SMARCA4. STK11, TP53, TSC1, TSC2, and VHL.  The following genes were evaluated for sequence changes only: SDHA and HOXB13 c.251G>A variant only. The report date is 05/24/2019.  (2) neoadjuvant chemotherapy consisting of doxorubicin and cyclophosphamide in dose dense fashion x4 started 05/26/2019, completed 07/07/2019, followed by paclitaxel and carboplatin weekly x12 started 07/21/2019  (3) definitive surgery to follow  (4) adjuvant radiation to follow  (5) consider bilateral salpingo-oophorectomy at the end of breast cancer treatment   PLAN: Emaya is doing remarkably well with her Taxol and carboplatin.  She has no peripheral neuropathy.  She is anemic but not symptomatic from that.  She has an excellent functional status.  Her port is working well.  We are proceeding with the third dose today.  She will also receive her Zoladex today.  She has not had a menstrual period for many months.  Surprisingly she is not having hot  flashes or other obvious menopausal symptoms.  At this point I think we can start seeing her every other treatment.  She will return 08/11/2019 for #4 and then she will see me 08/18/2019 for treatment #5  She knows to call for any other issue that may develop before the next visit.  Total encounter time 25 minutes.[name] Jews C. Donica Derouin, MD   08/05/2019 11:50 AM Medical Oncology and Hematology [facility] [place], [place] [address] Tel. [phone]    Fax. [phone]   I, Wilburn Mylar, am acting as scribe for Dr. [name]. Okley Magnussen.  I, Lurline Del MD, have reviewed the above documentation for accuracy and completeness, and I agree with the above.   *Total Encounter Time as defined by the Centers for Medicare and  Medicaid Services includes, in addition to the face-to-face time of a patient visit (documented in the note above) non-face-to-face time: obtaining and reviewing outside history, ordering and reviewing medications, tests or procedures, care coordination (communications with other health care professionals or caregivers) and documentation in the medical record.

## 2019-08-05 ENCOUNTER — Other Ambulatory Visit: Payer: Self-pay

## 2019-08-05 ENCOUNTER — Inpatient Hospital Stay: Payer: Self-pay

## 2019-08-05 ENCOUNTER — Inpatient Hospital Stay (HOSPITAL_BASED_OUTPATIENT_CLINIC_OR_DEPARTMENT_OTHER): Payer: Self-pay | Admitting: Oncology

## 2019-08-05 ENCOUNTER — Inpatient Hospital Stay: Payer: No Typology Code available for payment source

## 2019-08-05 VITALS — BP 109/66 | HR 86 | Temp 98.0°F | Resp 18 | Wt 155.4 lb

## 2019-08-05 DIAGNOSIS — Z1501 Genetic susceptibility to malignant neoplasm of breast: Secondary | ICD-10-CM

## 2019-08-05 DIAGNOSIS — K760 Fatty (change of) liver, not elsewhere classified: Secondary | ICD-10-CM

## 2019-08-05 DIAGNOSIS — C50811 Malignant neoplasm of overlapping sites of right female breast: Secondary | ICD-10-CM

## 2019-08-05 DIAGNOSIS — Z95828 Presence of other vascular implants and grafts: Secondary | ICD-10-CM

## 2019-08-05 DIAGNOSIS — Z01419 Encounter for gynecological examination (general) (routine) without abnormal findings: Secondary | ICD-10-CM

## 2019-08-05 DIAGNOSIS — Z171 Estrogen receptor negative status [ER-]: Secondary | ICD-10-CM

## 2019-08-05 DIAGNOSIS — Z1502 Genetic susceptibility to malignant neoplasm of ovary: Secondary | ICD-10-CM

## 2019-08-05 LAB — COMPREHENSIVE METABOLIC PANEL
ALT: 144 U/L — ABNORMAL HIGH (ref 0–44)
AST: 55 U/L — ABNORMAL HIGH (ref 15–41)
Albumin: 4.2 g/dL (ref 3.5–5.0)
Alkaline Phosphatase: 89 U/L (ref 38–126)
Anion gap: 8 (ref 5–15)
BUN: 7 mg/dL (ref 6–20)
CO2: 24 mmol/L (ref 22–32)
Calcium: 9.3 mg/dL (ref 8.9–10.3)
Chloride: 108 mmol/L (ref 98–111)
Creatinine, Ser: 0.57 mg/dL (ref 0.44–1.00)
GFR calc Af Amer: >60 mL/min (ref >60)
GFR calc non Af Amer: >60 mL/min (ref >60)
Glucose, Bld: 115 mg/dL — ABNORMAL HIGH (ref 70–99)
Potassium: 4 mmol/L (ref 3.5–5.1)
Sodium: 140 mmol/L (ref 135–145)
Total Bilirubin: 0.3 mg/dL (ref 0.3–1.2)
Total Protein: 7.6 g/dL (ref 6.5–8.1)

## 2019-08-05 LAB — CBC WITH DIFFERENTIAL/PLATELET
Abs Immature Granulocytes: 0.04 10*3/uL (ref 0.00–0.07)
Basophils Absolute: 0 10*3/uL (ref 0.0–0.1)
Basophils Relative: 1 %
Eosinophils Absolute: 0.1 10*3/uL (ref 0.0–0.5)
Eosinophils Relative: 2 %
HCT: 25.9 % — ABNORMAL LOW (ref 36.0–46.0)
Hemoglobin: 8.7 g/dL — ABNORMAL LOW (ref 12.0–15.0)
Immature Granulocytes: 1 %
Lymphocytes Relative: 25 %
Lymphs Abs: 1 10*3/uL (ref 0.7–4.0)
MCH: 32.6 pg (ref 26.0–34.0)
MCHC: 33.6 g/dL (ref 30.0–36.0)
MCV: 97 fL (ref 80.0–100.0)
Monocytes Absolute: 0.6 10*3/uL (ref 0.1–1.0)
Monocytes Relative: 14 %
Neutro Abs: 2.4 10*3/uL (ref 1.7–7.7)
Neutrophils Relative %: 57 %
Platelets: 244 10*3/uL (ref 150–400)
RBC: 2.67 MIL/uL — ABNORMAL LOW (ref 3.87–5.11)
RDW: 18.7 % — ABNORMAL HIGH (ref 11.5–15.5)
WBC: 4.1 10*3/uL (ref 4.0–10.5)
nRBC: 0 % (ref 0.0–0.2)

## 2019-08-05 LAB — PREGNANCY, URINE: Preg Test, Ur: NEGATIVE

## 2019-08-05 MED ORDER — FAMOTIDINE IN NACL 20-0.9 MG/50ML-% IV SOLN
INTRAVENOUS | Status: AC
  Filled 2019-08-05: qty 50

## 2019-08-05 MED ORDER — PALONOSETRON HCL INJECTION 0.25 MG/5ML
INTRAVENOUS | Status: AC
  Filled 2019-08-05: qty 5

## 2019-08-05 MED ORDER — FAMOTIDINE IN NACL 20-0.9 MG/50ML-% IV SOLN
20.0000 mg | Freq: Once | INTRAVENOUS | Status: AC
  Administered 2019-08-05: 20 mg via INTRAVENOUS

## 2019-08-05 MED ORDER — DEXAMETHASONE SODIUM PHOSPHATE 10 MG/ML IJ SOLN
INTRAMUSCULAR | Status: AC
  Filled 2019-08-05: qty 1

## 2019-08-05 MED ORDER — DEXAMETHASONE SODIUM PHOSPHATE 10 MG/ML IJ SOLN
10.0000 mg | Freq: Once | INTRAMUSCULAR | Status: AC
  Administered 2019-08-05: 10 mg via INTRAVENOUS

## 2019-08-05 MED ORDER — SODIUM CHLORIDE 0.9% FLUSH
10.0000 mL | Freq: Once | INTRAVENOUS | Status: AC
  Administered 2019-08-05: 10 mL
  Filled 2019-08-05: qty 10

## 2019-08-05 MED ORDER — HEPARIN SOD (PORK) LOCK FLUSH 100 UNIT/ML IV SOLN
500.0000 [IU] | Freq: Once | INTRAVENOUS | Status: AC | PRN
  Administered 2019-08-05: 500 [IU]
  Filled 2019-08-05: qty 5

## 2019-08-05 MED ORDER — PALONOSETRON HCL INJECTION 0.25 MG/5ML
0.2500 mg | Freq: Once | INTRAVENOUS | Status: AC
  Administered 2019-08-05: 0.25 mg via INTRAVENOUS

## 2019-08-05 MED ORDER — SODIUM CHLORIDE 0.9 % IV SOLN
80.0000 mg/m2 | Freq: Once | INTRAVENOUS | Status: AC
  Administered 2019-08-05: 13:00:00 150 mg via INTRAVENOUS
  Filled 2019-08-05: qty 25

## 2019-08-05 MED ORDER — SODIUM CHLORIDE 0.9% FLUSH
10.0000 mL | INTRAVENOUS | Status: DC | PRN
  Administered 2019-08-05: 10 mL
  Filled 2019-08-05: qty 10

## 2019-08-05 MED ORDER — DIPHENHYDRAMINE HCL 50 MG/ML IJ SOLN
25.0000 mg | Freq: Once | INTRAMUSCULAR | Status: AC
  Administered 2019-08-05: 25 mg via INTRAVENOUS

## 2019-08-05 MED ORDER — DIPHENHYDRAMINE HCL 50 MG/ML IJ SOLN
INTRAMUSCULAR | Status: AC
  Filled 2019-08-05: qty 1

## 2019-08-05 MED ORDER — SODIUM CHLORIDE 0.9 % IV SOLN
287.8000 mg | Freq: Once | INTRAVENOUS | Status: AC
  Administered 2019-08-05: 290 mg via INTRAVENOUS
  Filled 2019-08-05: qty 29

## 2019-08-05 MED ORDER — SODIUM CHLORIDE 0.9 % IV SOLN
Freq: Once | INTRAVENOUS | Status: AC
  Filled 2019-08-05: qty 250

## 2019-08-05 NOTE — Patient Instructions (Signed)

## 2019-08-05 NOTE — Patient Instructions (Addendum)
   [facility] Discharge Instructions for Patients Receiving Chemotherapy  Today you received the following chemotherapy agents Taxol and Carboplatin   To help prevent nausea and vomiting after your treatment, we encourage you to take your nausea medication as directed.    If you develop nausea and vomiting that is not controlled by your nausea medication, call the clinic.   BELOW ARE SYMPTOMS THAT SHOULD BE REPORTED IMMEDIATELY:  *FEVER GREATER THAN 100.5 F  *CHILLS WITH OR WITHOUT FEVER  NAUSEA AND VOMITING THAT IS NOT CONTROLLED WITH YOUR NAUSEA MEDICATION  *UNUSUAL SHORTNESS OF BREATH  *UNUSUAL BRUISING OR BLEEDING  TENDERNESS IN MOUTH AND THROAT WITH OR WITHOUT PRESENCE OF ULCERS  *URINARY PROBLEMS  *BOWEL PROBLEMS  UNUSUAL RASH Items with * indicate a potential emergency and should be followed up as soon as possible.  Feel free to call the clinic should you have any questions or concerns. The clinic phone number is [phone].  Please show the CHEMO ALERT CARD at check-in to the Emergency Department and triage nurse.   

## 2019-08-06 ENCOUNTER — Inpatient Hospital Stay: Payer: No Typology Code available for payment source

## 2019-08-10 ENCOUNTER — Encounter: Payer: Self-pay | Admitting: *Deleted

## 2019-08-11 ENCOUNTER — Other Ambulatory Visit: Payer: Self-pay

## 2019-08-11 ENCOUNTER — Inpatient Hospital Stay: Payer: Self-pay

## 2019-08-11 VITALS — BP 110/73 | HR 86 | Temp 97.8°F | Resp 18

## 2019-08-11 DIAGNOSIS — C50811 Malignant neoplasm of overlapping sites of right female breast: Secondary | ICD-10-CM

## 2019-08-11 DIAGNOSIS — Z171 Estrogen receptor negative status [ER-]: Secondary | ICD-10-CM

## 2019-08-11 DIAGNOSIS — Z01419 Encounter for gynecological examination (general) (routine) without abnormal findings: Secondary | ICD-10-CM

## 2019-08-11 DIAGNOSIS — Z95828 Presence of other vascular implants and grafts: Secondary | ICD-10-CM

## 2019-08-11 LAB — CBC WITH DIFFERENTIAL/PLATELET
Abs Immature Granulocytes: 0.01 10*3/uL (ref 0.00–0.07)
Basophils Absolute: 0 10*3/uL (ref 0.0–0.1)
Basophils Relative: 1 %
Eosinophils Absolute: 0.1 10*3/uL (ref 0.0–0.5)
Eosinophils Relative: 2 %
HCT: 24.9 % — ABNORMAL LOW (ref 36.0–46.0)
Hemoglobin: 8.4 g/dL — ABNORMAL LOW (ref 12.0–15.0)
Immature Granulocytes: 0 %
Lymphocytes Relative: 23 %
Lymphs Abs: 0.7 10*3/uL (ref 0.7–4.0)
MCH: 32.4 pg (ref 26.0–34.0)
MCHC: 33.7 g/dL (ref 30.0–36.0)
MCV: 96.1 fL (ref 80.0–100.0)
Monocytes Absolute: 0.2 10*3/uL (ref 0.1–1.0)
Monocytes Relative: 8 %
Neutro Abs: 2.1 10*3/uL (ref 1.7–7.7)
Neutrophils Relative %: 66 %
Platelets: 211 10*3/uL (ref 150–400)
RBC: 2.59 MIL/uL — ABNORMAL LOW (ref 3.87–5.11)
RDW: 17.7 % — ABNORMAL HIGH (ref 11.5–15.5)
WBC: 3.1 10*3/uL — ABNORMAL LOW (ref 4.0–10.5)
nRBC: 0 % (ref 0.0–0.2)

## 2019-08-11 LAB — COMPREHENSIVE METABOLIC PANEL
ALT: 101 U/L — ABNORMAL HIGH (ref 0–44)
AST: 42 U/L — ABNORMAL HIGH (ref 15–41)
Albumin: 4 g/dL (ref 3.5–5.0)
Alkaline Phosphatase: 96 U/L (ref 38–126)
Anion gap: 10 (ref 5–15)
BUN: 10 mg/dL (ref 6–20)
CO2: 22 mmol/L (ref 22–32)
Calcium: 8.8 mg/dL — ABNORMAL LOW (ref 8.9–10.3)
Chloride: 109 mmol/L (ref 98–111)
Creatinine, Ser: 0.54 mg/dL (ref 0.44–1.00)
GFR calc Af Amer: >60 mL/min (ref >60)
GFR calc non Af Amer: >60 mL/min (ref >60)
Glucose, Bld: 103 mg/dL — ABNORMAL HIGH (ref 70–99)
Potassium: 4.1 mmol/L (ref 3.5–5.1)
Sodium: 141 mmol/L (ref 135–145)
Total Bilirubin: 0.3 mg/dL (ref 0.3–1.2)
Total Protein: 7.1 g/dL (ref 6.5–8.1)

## 2019-08-11 LAB — PREGNANCY, URINE: Preg Test, Ur: NEGATIVE

## 2019-08-11 MED ORDER — FAMOTIDINE IN NACL 20-0.9 MG/50ML-% IV SOLN
20.0000 mg | Freq: Once | INTRAVENOUS | Status: AC
  Administered 2019-08-11: 20 mg via INTRAVENOUS

## 2019-08-11 MED ORDER — SODIUM CHLORIDE 0.9 % IV SOLN
287.8000 mg | Freq: Once | INTRAVENOUS | Status: AC
  Administered 2019-08-11: 290 mg via INTRAVENOUS
  Filled 2019-08-11: qty 29

## 2019-08-11 MED ORDER — SODIUM CHLORIDE 0.9 % IV SOLN
80.0000 mg/m2 | Freq: Once | INTRAVENOUS | Status: AC
  Administered 2019-08-11: 150 mg via INTRAVENOUS
  Filled 2019-08-11: qty 25

## 2019-08-11 MED ORDER — LEUPROLIDE ACETATE 3.75 MG IM KIT
3.7500 mg | PACK | Freq: Once | INTRAMUSCULAR | Status: AC
  Administered 2019-08-11: 3.75 mg via INTRAMUSCULAR
  Filled 2019-08-11: qty 3.75

## 2019-08-11 MED ORDER — FAMOTIDINE IN NACL 20-0.9 MG/50ML-% IV SOLN
INTRAVENOUS | Status: AC
  Filled 2019-08-11: qty 50

## 2019-08-11 MED ORDER — DIPHENHYDRAMINE HCL 50 MG/ML IJ SOLN
INTRAMUSCULAR | Status: AC
  Filled 2019-08-11: qty 1

## 2019-08-11 MED ORDER — PALONOSETRON HCL INJECTION 0.25 MG/5ML
INTRAVENOUS | Status: AC
  Filled 2019-08-11: qty 5

## 2019-08-11 MED ORDER — DIPHENHYDRAMINE HCL 50 MG/ML IJ SOLN
25.0000 mg | Freq: Once | INTRAMUSCULAR | Status: AC
  Administered 2019-08-11: 25 mg via INTRAVENOUS

## 2019-08-11 MED ORDER — SODIUM CHLORIDE 0.9 % IV SOLN
Freq: Once | INTRAVENOUS | Status: AC
  Filled 2019-08-11: qty 250

## 2019-08-11 MED ORDER — HEPARIN SOD (PORK) LOCK FLUSH 100 UNIT/ML IV SOLN
500.0000 [IU] | Freq: Once | INTRAVENOUS | Status: AC | PRN
  Administered 2019-08-11: 500 [IU]
  Filled 2019-08-11: qty 5

## 2019-08-11 MED ORDER — SODIUM CHLORIDE 0.9% FLUSH
10.0000 mL | INTRAVENOUS | Status: DC | PRN
  Administered 2019-08-11: 10 mL
  Filled 2019-08-11: qty 10

## 2019-08-11 MED ORDER — DEXAMETHASONE SODIUM PHOSPHATE 10 MG/ML IJ SOLN
INTRAMUSCULAR | Status: AC
  Filled 2019-08-11: qty 1

## 2019-08-11 MED ORDER — DEXAMETHASONE SODIUM PHOSPHATE 10 MG/ML IJ SOLN
10.0000 mg | Freq: Once | INTRAMUSCULAR | Status: AC
  Administered 2019-08-11: 10 mg via INTRAVENOUS

## 2019-08-11 MED ORDER — PALONOSETRON HCL INJECTION 0.25 MG/5ML
0.2500 mg | Freq: Once | INTRAVENOUS | Status: AC
  Administered 2019-08-11: 0.25 mg via INTRAVENOUS

## 2019-08-11 NOTE — Patient Instructions (Addendum)
[facility] Discharge Instructions for Patients Receiving Chemotherapy  Today you received the following chemotherapy agents Taxol and Carboplatin   To help prevent nausea and vomiting after your treatment, we encourage you to take your nausea medication as directed.   If you develop nausea and vomiting that is not controlled by your nausea medication, call the clinic.   BELOW ARE SYMPTOMS THAT SHOULD BE REPORTED IMMEDIATELY:  *FEVER GREATER THAN 100.5 F  *CHILLS WITH OR WITHOUT FEVER  NAUSEA AND VOMITING THAT IS NOT CONTROLLED WITH YOUR NAUSEA MEDICATION  *UNUSUAL SHORTNESS OF BREATH  *UNUSUAL BRUISING OR BLEEDING  TENDERNESS IN MOUTH AND THROAT WITH OR WITHOUT PRESENCE OF ULCERS  *URINARY PROBLEMS  *BOWEL PROBLEMS  UNUSUAL RASH Items with * indicate a potential emergency and should be followed up as soon as possible.  Feel free to call the clinic should you have any questions or concerns. The clinic phone number is [phone].  Please show the [place] at check-in to the Emergency Department and triage nurse.Leuprolide injection Qu es este medicamento? El LEUPROLIDE es una hormona artificial. Se utiliza para tratar sntomas del cncer de prstata. Este medicamento tambin puede utilizarse para el tratamiento de la pubertad precoz en nios. Se puede utilizar para otras enfermedades hormonales. Este medicamento puede ser utilizado para otros usos; si tiene alguna pregunta consulte con su proveedor de atencin mdica o con su farmacutico. MARCAS COMUNES: Lupron Qu le debo informar a mi profesional de la salud antes de tomar este medicamento? Necesitan saber si usted presenta alguno de los [employer] o situaciones: diabetes enfermedad cardiaca o ataque cardiaco previo alta presin sangunea nivel de colesterol alto dolor o dificultad para orinar metstasis en la mdula espinal accidente cerebrovascular fuma tabaco una reaccin alrgica o  inusual a la leuprolida (leuprorelina), al alcohol benclico, a otros medicamentos, alimentos, colorantes o conservantes si est embarazada o buscando quedar embarazada si est amamantando a un beb Cmo debo utilizar este medicamento? Este medicamento se administra mediante inyeccin debajo de la piel o en un msculo. Le ensearn cmo preparar y Biomedical engineer. selo exactamente como se le indique. Tome su medicamento a intervalos regulares. No tome su medicamento con una frecuencia mayor a la indicada. Es importante que deseche las agujas y las jeringas usadas en un recipiente resistente a los pinchazos. No los deseche en la basura. Si no tiene un recipiente resistente a los pinchazos, consulte a Midwife o proveedor de atencin de la salud para obtenerlo. Su farmacutico le dar una Gua del medicamento especial (MedGuide, nombre en ingls) con cada receta y en cada ocasin que la vuelva a surtir. Asegrese de leer esta informacin cada vez cuidadosamente. Hable con su pediatra para informarse acerca del uso de este medicamento en nios. Aunque este medicamento se puede recetar a nios tan pequeos como de 8 aos de edad en casos selectos, existen precauciones que deben tomarse. Sobredosis: Pngase en contacto inmediatamente con un centro toxicolgico o una sala de urgencia si usted cree que haya tomado demasiado medicamento. ATENCIN: [employer] es solo para usted. No comparta este medicamento con nadie. Qu sucede si me olvido de una dosis? Si olvida una dosis, sela lo antes posible. Si es casi la hora de la prxima dosis, use slo esa dosis. No use dosis adicionales o dobles. Qu puede interactuar con este medicamento? No tome esta medicina con ninguno de los siguientes medicamentos:  vitex Esta medicina tambin puede interactuar con los siguientes medicamentos:  suplementos a  base de hierbas o dietticos como cimicifuga racemosa o DHEA  hormonas femeninas,  como estrgenos, progestinas o pldoras, parches, anillos o inyecciones anticonceptivas  hormonas masculinas como la testosterona Puede ser que esta lista no menciona todas las posibles interacciones. Informe a su profesional de [employer]	[place] de [employer] productos a base de hierbas, medicamentos de [place] o suplementos nutritivos que est tomando. Si usted fuma, consume bebidas alcohlicas o si utiliza drogas ilegales, indqueselo tambin a su profesional de [employer]	[place]. Algunas sustancias pueden interactuar con su medicamento. A qu debo estar atento al usar [employer]? Visite a su mdico o a su profesional de la salud para que revise regularmente su evolucin. Durante la primera semana, sus sntomas podran empeorar, pero luego mejorarn a medida que contine su tratamiento. Es posible que tenga sofocos, dolor seo ms intenso, mayor dificultad para Garment/textile technologist o un agravamiento de los sntomas neurolgicos. Converse sobre estos efectos con su mdico o con su profesional de [employer]	[place]. Algunos pueden mejorar con el uso continuo de [place]. Las mujeres pueden tener un ciclo menstrual o [employer] durante los primeros 2 meses de terapia con este medicamento. Si esto contina, contacte a su mdico o profesional de [employer]	[place]. Este medicamento puede aumentar los niveles de Dispensing optician. Pregntele a su profesional de la salud si es Chartered loss adjuster cambios en la dieta o en los medicamentos si usted tiene diabetes. Qu efectos secundarios puedo tener al [employer] este medicamento? Efectos secundarios que debe informar a su mdico o a Barrister's clerk de la salud tan pronto como sea posible: Chief of Staff, como erupcin cutnea, comezn/picazn o urticaria, e hinchazn de la cara, los labios o la lengua problemas para Contractor en el pecho depresin o trastornos de Programmer, applications en las piernas o la entrepierna Management consultant de la inyeccin dolor de cabeza grave signos y sntomas de  niveles altos de Location manager en la sangre, tales como tener ms sed o apetito, o tener que orinar con mayor frecuencia que lo habitual. Tambin puede sentirse muy cansado o tener visin borrosa hinchazn de los pies y las piernas cambios en la visin vmito Efectos secundarios que generalmente no requieren atencin mdica (infrmelos a su mdico o a su profesional de la salud si persisten o si son molestos): sensibilidad o hinchazn de las mamas disminucin del deseo o desempeo sexual diarrea sofocos prdida del apetito Patent examiner, articulaciones o huesos nuseas enrojecimiento o Actor de la inyeccin problemas de piel o acn Puede ser que esta lista no menciona todos los posibles efectos secundarios. Comunquese a su mdico por asesoramiento mdico [employer]. Usted puede informar los efectos secundarios a la FDA por telfono al 1-800-FDA-1088. Dnde debo guardar mi medicina? Mantngala fuera del alcance de los nios. Gurdela a una temperatura inferior de 25 grados C (77 grados F). No la congele. Protjala de la luz. No utilizarla si no es transparente o si contiene partculas. Deseche todo el medicamento que no haya utilizado, despus de la fecha de vencimiento. ATENCIN: Este folleto es un resumen. Puede ser que no cubra toda la posible informacin. Si usted tiene preguntas acerca de esta medicina, consulte con su mdico, su farmacutico o su profesional de Technical sales engineer.  2020 Elsevier/Gold Standard (2018-11-01 00:00:00)

## 2019-08-11 NOTE — Progress Notes (Signed)
Okay to treat today with ALT 101, per Dr. Jana Hakim.

## 2019-08-18 ENCOUNTER — Inpatient Hospital Stay: Payer: No Typology Code available for payment source | Attending: Oncology

## 2019-08-18 ENCOUNTER — Other Ambulatory Visit: Payer: Self-pay

## 2019-08-18 ENCOUNTER — Inpatient Hospital Stay: Payer: No Typology Code available for payment source

## 2019-08-18 ENCOUNTER — Inpatient Hospital Stay (HOSPITAL_BASED_OUTPATIENT_CLINIC_OR_DEPARTMENT_OTHER): Payer: Self-pay | Admitting: Adult Health

## 2019-08-18 VITALS — BP 101/69 | HR 87 | Temp 98.2°F | Resp 18 | Ht 68.0 in | Wt 159.3 lb

## 2019-08-18 DIAGNOSIS — Z79899 Other long term (current) drug therapy: Secondary | ICD-10-CM | POA: Insufficient documentation

## 2019-08-18 DIAGNOSIS — C50811 Malignant neoplasm of overlapping sites of right female breast: Secondary | ICD-10-CM

## 2019-08-18 DIAGNOSIS — Z95828 Presence of other vascular implants and grafts: Secondary | ICD-10-CM

## 2019-08-18 DIAGNOSIS — Z171 Estrogen receptor negative status [ER-]: Secondary | ICD-10-CM | POA: Insufficient documentation

## 2019-08-18 DIAGNOSIS — Z5111 Encounter for antineoplastic chemotherapy: Secondary | ICD-10-CM | POA: Insufficient documentation

## 2019-08-18 DIAGNOSIS — Z1501 Genetic susceptibility to malignant neoplasm of breast: Secondary | ICD-10-CM

## 2019-08-18 DIAGNOSIS — R945 Abnormal results of liver function studies: Secondary | ICD-10-CM | POA: Insufficient documentation

## 2019-08-18 DIAGNOSIS — Z79818 Long term (current) use of other agents affecting estrogen receptors and estrogen levels: Secondary | ICD-10-CM | POA: Insufficient documentation

## 2019-08-18 DIAGNOSIS — Z1502 Genetic susceptibility to malignant neoplasm of ovary: Secondary | ICD-10-CM

## 2019-08-18 DIAGNOSIS — Z803 Family history of malignant neoplasm of breast: Secondary | ICD-10-CM | POA: Insufficient documentation

## 2019-08-18 LAB — CBC WITH DIFFERENTIAL/PLATELET
Abs Immature Granulocytes: 0.01 10*3/uL (ref 0.00–0.07)
Basophils Absolute: 0 10*3/uL (ref 0.0–0.1)
Basophils Relative: 1 %
Eosinophils Absolute: 0.1 10*3/uL (ref 0.0–0.5)
Eosinophils Relative: 2 %
HCT: 25.3 % — ABNORMAL LOW (ref 36.0–46.0)
Hemoglobin: 8.6 g/dL — ABNORMAL LOW (ref 12.0–15.0)
Immature Granulocytes: 0 %
Lymphocytes Relative: 24 %
Lymphs Abs: 0.9 10*3/uL (ref 0.7–4.0)
MCH: 33.2 pg (ref 26.0–34.0)
MCHC: 34 g/dL (ref 30.0–36.0)
MCV: 97.7 fL (ref 80.0–100.0)
Monocytes Absolute: 0.3 10*3/uL (ref 0.1–1.0)
Monocytes Relative: 8 %
Neutro Abs: 2.5 10*3/uL (ref 1.7–7.7)
Neutrophils Relative %: 65 %
Platelets: 197 10*3/uL (ref 150–400)
RBC: 2.59 MIL/uL — ABNORMAL LOW (ref 3.87–5.11)
RDW: 18.2 % — ABNORMAL HIGH (ref 11.5–15.5)
WBC: 3.9 10*3/uL — ABNORMAL LOW (ref 4.0–10.5)
nRBC: 0 % (ref 0.0–0.2)

## 2019-08-18 LAB — COMPREHENSIVE METABOLIC PANEL
ALT: 147 U/L — ABNORMAL HIGH (ref 0–44)
AST: 65 U/L — ABNORMAL HIGH (ref 15–41)
Albumin: 4.2 g/dL (ref 3.5–5.0)
Alkaline Phosphatase: 78 U/L (ref 38–126)
Anion gap: 8 (ref 5–15)
BUN: 8 mg/dL (ref 6–20)
CO2: 25 mmol/L (ref 22–32)
Calcium: 9.2 mg/dL (ref 8.9–10.3)
Chloride: 107 mmol/L (ref 98–111)
Creatinine, Ser: 0.4 mg/dL — ABNORMAL LOW (ref 0.44–1.00)
GFR calc Af Amer: >60 mL/min (ref >60)
GFR calc non Af Amer: >60 mL/min (ref >60)
Glucose, Bld: 129 mg/dL — ABNORMAL HIGH (ref 70–99)
Potassium: 3.9 mmol/L (ref 3.5–5.1)
Sodium: 140 mmol/L (ref 135–145)
Total Bilirubin: 0.4 mg/dL (ref 0.3–1.2)
Total Protein: 7.3 g/dL (ref 6.5–8.1)

## 2019-08-18 LAB — PREGNANCY, URINE: Preg Test, Ur: NEGATIVE

## 2019-08-18 MED ORDER — DIPHENHYDRAMINE HCL 50 MG/ML IJ SOLN
INTRAMUSCULAR | Status: AC
  Filled 2019-08-18: qty 1

## 2019-08-18 MED ORDER — SODIUM CHLORIDE 0.9% FLUSH
10.0000 mL | INTRAVENOUS | Status: DC | PRN
  Administered 2019-08-18: 10 mL
  Filled 2019-08-18: qty 10

## 2019-08-18 MED ORDER — HEPARIN SOD (PORK) LOCK FLUSH 100 UNIT/ML IV SOLN
500.0000 [IU] | Freq: Once | INTRAVENOUS | Status: AC | PRN
  Administered 2019-08-18: 500 [IU]
  Filled 2019-08-18: qty 5

## 2019-08-18 MED ORDER — DEXAMETHASONE SODIUM PHOSPHATE 10 MG/ML IJ SOLN
INTRAMUSCULAR | Status: AC
  Filled 2019-08-18: qty 1

## 2019-08-18 MED ORDER — SODIUM CHLORIDE 0.9% FLUSH
10.0000 mL | Freq: Once | INTRAVENOUS | Status: AC
  Administered 2019-08-18: 13:00:00 10 mL
  Filled 2019-08-18: qty 10

## 2019-08-18 MED ORDER — FAMOTIDINE IN NACL 20-0.9 MG/50ML-% IV SOLN
INTRAVENOUS | Status: AC
  Filled 2019-08-18: qty 50

## 2019-08-18 MED ORDER — SODIUM CHLORIDE 0.9 % IV SOLN
Freq: Once | INTRAVENOUS | Status: AC
  Filled 2019-08-18: qty 250

## 2019-08-18 MED ORDER — PALONOSETRON HCL INJECTION 0.25 MG/5ML
INTRAVENOUS | Status: AC
  Filled 2019-08-18: qty 5

## 2019-08-18 NOTE — Patient Instructions (Signed)

## 2019-08-18 NOTE — Progress Notes (Signed)
No chemotherapy today due to increase ALT pet Wilber Bihari, NP.  Pt to come back next week for recheck.  Infusion nurse notified.

## 2019-08-18 NOTE — Progress Notes (Signed)
[facility]  [phone] [phone]     ID: Brianna Owens DOB: 1990-07-20  MR#: [id number]  MGQ#:[id number]  Patient Care Team: Patient, [name] as PCP - General (General Practice) Brianna Owens, Brianna Dad, Brianna Owens as Consulting Physician (Oncology) Brianna Luna, Brianna Owens as Consulting Physician (General Surgery) Brianna Kaufmann, Brianna Owens as Oncology Nurse Navigator Brianna Germany, Brianna Owens as Oncology Nurse Navigator Brianna Dock, Brianna Owens OTHER Brianna Owens:  CHIEF COMPLAINT: Triple negative breast cancer, BRCA1 positive  CURRENT TREATMENT: neoadjuvant chemotherapy   INTERVAL HISTORY: Brianna Owens returns today for follow up and treatment of her triple negative breast cancer.  She continues on weekly paclitacxel and carboplatin. Today is her fifth dose.  She has been tolerating treatment well.  She has no peripheral neuropathy.    REVIEW OF SYSTEMS: Brianna Owens feels well.  She denies any fever, chills, chest pain, palpitations, cough, shortness of breath, bowel/bladder changes, headaches, vision issues, nausea, vomiting, skin changes or any other concerns.  A detailed ROS was otherwise non contributory.    HISTORY OF CURRENT ILLNESS: From the original intake note:  Brianna [facility] with a 3 month history of a right breast lump that became painful in early 04/2019. Physical exam performed at that time showed a palpable, tender 13 cm lump within the right center breast under the nipple area. She underwent right breast ultrasonography at [facility] showing: 6.1 cm palpable mass centered in the 5 o'clock position of the right breast; single right inferomedial axillary lymph node with focal cortical thickening inferiorly.  Accordingly on 05/04/2019 she proceeded to biopsy of the right breast area in question. The pathology from this procedure ([id number]) showed: invasive ductal  carcinoma, grade 2-3. Prognostic indicators significant for: estrogen receptor, 0% negative and progesterone receptor, 0% negative. Proliferation marker Ki67 at 40%.  I do not find HER-2 receptor documentation  The right axillary lymph node biopsied at that time showed reactive germinal centers.  She underwent bilateral diagnostic mammography with tomography at [facility] on 05/05/2019 showing: breast density category D; biopsy-proven right breast malignancy measures 6.1 cm; no additional masses or calcifications seen in the right breast; no evidence of malignancy in the left breast.  The patient's subsequent history is as detailed below.   PAST MEDICAL HISTORY: No past medical history on file.  PAST SURGICAL HISTORY: Past Surgical History:  Procedure Laterality Date  . IR IMAGING GUIDED PORT INSERTION  05/24/2019    FAMILY HISTORY: Family History  Problem Relation Age of Onset  . Breast cancer Mother   . Breast cancer Maternal Aunt    Patient's father is 80 and her mother 62 as of November 2020.  The patient's mother was diagnosed with breast cancer in her early 47s.  She lives in [place]. The patient's mother's sister was also diagnosed with breast cancer in her early 18s.  The patient herself has 1 sister, no brothers.  She is not aware of any ovarian cancer cases in the family.   GYNECOLOGIC HISTORY:  No LMP recorded. Menarche: 29 years old Age at first live birth: 29 years old Brianna Owens P 2 LMPregular Contraceptive HRT n/a  Hysterectomy? no BSO? no   SOCIAL HISTORY: (updated 04/2019)  Brianna Owens is currently working at [employer].  She is originally from [place].  She is divorced.  Her children are Brianna Owens, 48 years old, living in [place] with his father, and Brianna Owens, 20 years old, who  lives with the patient.  Also at home are her uncle Brianna Owens, his wife, and that wife's cousin.     ADVANCED DIRECTIVES: Not in place   HEALTH MAINTENANCE: Social History   Tobacco  Use  . Smoking status: Never Smoker  . Smokeless tobacco: Never Used  Substance Use Topics  . Alcohol use: Not Currently  . Drug use: Not Currently     Colonoscopy: n/a  PAP: 04/26/2019, negative  Bone density: n/a   No Known Allergies  Current Outpatient Medications  Medication Sig Dispense Refill  . lidocaine-prilocaine (EMLA) cream Apply to affected area once 30 g 3  . loratadine (CLARITIN) 10 MG tablet Take 1 tablet (10 mg total) by mouth daily. 60 tablet 0  . prochlorperazine (COMPAZINE) 10 MG tablet Take 1 tablet (10 mg total) by mouth every 6 (six) hours as needed (Nausea or vomiting). 30 tablet 1  . venlafaxine XR (EFFEXOR-XR) 75 MG 24 hr capsule Take 1 capsule (75 mg total) by mouth daily with breakfast. 30 capsule 4   No current facility-administered medications for this visit.    OBJECTIVE: Young Spanish speaker who appears stated age  There were no vitals filed for this visit.   There is no height or weight on file to calculate BMI.   Wt Readings from Last 3 Encounters:  08/05/19 155 lb 6.4 oz (70.5 kg)  07/28/19 154 lb 1.6 oz (69.9 kg)  07/21/19 154 lb 6.4 oz (70 kg)      ECOG FS:0 - Asymptomatic GENERAL: Patient is a well appearing female in no acute distress HEENT:  Sclerae anicteric.  Mask in place Neck is supple.  NODES:  No cervical, supraclavicular, or axillary lymphadenopathy palpated.  BREAST EXAM:  Difficult to palpate right breast mass ? 0.5 cm, no progression noted. LUNGS:  Clear to auscultation bilaterally.  No wheezes or rhonchi. HEART:  Regular rate and rhythm. No murmur appreciated. ABDOMEN:  Soft, nontender.  Positive, normoactive bowel sounds. No organomegaly palpated. MSK:  No focal spinal tenderness to palpation. Full range of motion bilaterally in the upper extremities. EXTREMITIES:  No peripheral edema.   SKIN:  Clear with no obvious rashes or skin changes. No nail dyscrasia. NEURO:  Nonfocal. Well oriented.  Appropriate affect.    LAB  RESULTS:  CMP     Component Value Date/Time   NA 141 08/11/2019 0900   K 4.1 08/11/2019 0900   CL 109 08/11/2019 0900   CO2 22 08/11/2019 0900   GLUCOSE 103 (H) 08/11/2019 0900   BUN 10 08/11/2019 0900   CREATININE 0.54 08/11/2019 0900   CREATININE 0.45 07/21/2019 1025   CALCIUM 8.8 (L) 08/11/2019 0900   PROT 7.1 08/11/2019 0900   ALBUMIN 4.0 08/11/2019 0900   AST 42 (H) 08/11/2019 0900   AST 29 07/21/2019 1025   ALT 101 (H) 08/11/2019 0900   ALT 58 (H) 07/21/2019 1025   ALKPHOS 96 08/11/2019 0900   BILITOT 0.3 08/11/2019 0900   BILITOT 0.3 07/21/2019 1025   GFRNONAA >60 08/11/2019 0900   GFRNONAA >60 07/21/2019 1025   GFRAA >60 08/11/2019 0900   GFRAA >60 07/21/2019 1025    No results found for: TOTALPROTELP, ALBUMINELP, A1GS, A2GS, BETS, BETA2SER, GAMS, MSPIKE, SPEI  No results found for: KPAFRELGTCHN, LAMBDASER, KAPLAMBRATIO  Lab Results  Component Value Date   WBC 3.9 (L) 08/18/2019   NEUTROABS 2.5 08/18/2019   HGB 8.6 (L) 08/18/2019   HCT 25.3 (L) 08/18/2019   MCV 97.7 08/18/2019   PLT  197 08/18/2019    No results found for: LABCA2  No components found for: QZESPQ330  No results for input(s): INR in the last 168 hours.  No results found for: LABCA2  No results found for: QTM226  No results found for: JFH545  No results found for: GYB638  No results found for: CA2729  No components found for: HGQUANT  No results found for: CEA1 / No results found for: CEA1   No results found for: AFPTUMOR  No results found for: CHROMOGRNA  No results found for: HGBA, HGBA2QUANT, HGBFQUANT, HGBSQUAN (Hemoglobinopathy evaluation)   No results found for: LDH  No results found for: IRON, TIBC, IRONPCTSAT (Iron and TIBC)  No results found for: FERRITIN  Urinalysis No results found for: COLORURINE, APPEARANCEUR, LABSPEC, PHURINE, GLUCOSEU, HGBUR, BILIRUBINUR, KETONESUR, PROTEINUR, UROBILINOGEN, NITRITE, LEUKOCYTESUR   STUDIES: No results  found.   ELIGIBLE FOR AVAILABLE RESEARCH PROTOCOL:no  ASSESSMENT: 29 y.o. BRCA1 positive Brianna Owens woman status post right breast overlapping sites biopsy 05/04/2019 for a clinical T3 N0, stage IIIB invasive ductal carcinoma, grade 2, triple negative, with an MIB-1 of 40%.  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  The Common Hereditary Gene Panel offered by [facility] includes sequencing and/or deletion duplication testing of the following 48 genes: APC, ATM, AXIN2, BARD1, BMPR1A, BRCA1, BRCA2, BRIP1, CDH1, CDK4, CDKN2A (p14ARF), CDKN2A (p16INK4a), CHEK2, CTNNA1, DICER1, EPCAM (Deletion/duplication testing only), GREM1 (promoter region deletion/duplication testing only), KIT, MEN1, MLH1, MSH2, MSH3, MSH6, MUTYH, NBN, NF1, NHTL1, PALB2, PDGFRA, PMS2, POLD1, POLE, PTEN, RAD50, RAD51C, RAD51D, RNF43, SDHB, SDHC, SDHD, SMAD4, SMARCA4. STK11, TP53, TSC1, TSC2, and VHL.  The following genes were evaluated for sequence changes only: SDHA and HOXB13 c.251G>A variant only. The report date is 05/24/2019.  (2) neoadjuvant chemotherapy consisting of doxorubicin and cyclophosphamide in dose dense fashion x4 started 05/26/2019, completed 07/07/2019, followed by paclitaxel and carboplatin weekly x12 started 07/21/2019  (3) definitive surgery to follow  (4) adjuvant radiation to follow  (5) consider bilateral salpingo-oophorectomy at the end of breast cancer treatment   PLAN: Brianna Owens conitnues on chemotherapy with weekly Paclitaxel and Carboplatin.  Her CBC today is normal, CMET is pending.  She is tolerating treatment well, and will continue on therapy.  Her tumor is responding, and she has not developed peripheral neuropathy at this point which we are monitoring her closely for.    Brianna Owens will return weekly for her  treatment.  I recommended healthy diet and that she remain as active as she can.  She was recommended to continue with the appropriate pandemic precautions. She knows to call for any questions that may arise between now and her next appointment.  We are happy to see her sooner if needed.  Total encounter time: 20 minutes*  Wilber Bihari, Brianna Owens 08/19/19 9:54 AM Medical Oncology and Hematology [facility] [place], [place] [address] Tel. [phone]    Fax. [phone]   *Total Encounter Time as defined by the Centers for Medicare and Medicaid Services includes, in addition to the face-to-face time of a patient visit (documented in the note above) non-face-to-face time: obtaining and reviewing outside history, ordering and reviewing medications, tests or procedures, care coordination (communications with other health care professionals or caregivers) and documentation in the medical record.  Addendum: Brianna Owens's LFS were elevated, her ALT at 147.  I recommended  that we hold her chemotherapy today, and have her come back next week for recheck.

## 2019-08-19 ENCOUNTER — Other Ambulatory Visit: Payer: Self-pay | Admitting: Oncology

## 2019-08-19 ENCOUNTER — Encounter: Payer: Self-pay | Admitting: Adult Health

## 2019-08-24 ENCOUNTER — Telehealth: Payer: Self-pay | Admitting: General Practice

## 2019-08-24 ENCOUNTER — Encounter: Payer: Self-pay | Admitting: Oncology

## 2019-08-24 NOTE — Telephone Encounter (Signed)
[facility] CSW Progress Notes  Email received from Sharene Skeans, Museum/gallery conservator w [facility] [employer].  They have been assisting patient w Medicaid application - they have learned patient has been denied and are trying to contact patient to help her apply for other available financial assistance.  State they cannot reach patient at prior cell phone number.  Spoke w patient w Benjamine Sprague, [place] interpreter.  Provided patient w request to call Ms Charmaine Downs and also provided [facility] w patient's new contact information w patient's consent.  Edwyna Shell, LCSW Clinical Social Worker Phone:  [phone] Cell:  [phone]

## 2019-08-24 NOTE — Progress Notes (Signed)
Per hospital couselor notes, patient not a citizen and not eligible for Medicaid.  Gave Julie(interpreter) a [facility] FAA for patient along with list of documents needed to process with application to give to patient on 08/25/19.  Application will be processed by Customer Service once submitted and patient will be notified by letter of determination.

## 2019-08-24 NOTE — Telephone Encounter (Signed)
[place] CSW Progress Notes  Per Red Christians, Financial Advocate, [facility] [employer] is aware that patient has been denied for Medicaid, application for [facility] [employer] [facility] was mailed 1/21, has not been returned.  Is working w Spanish interpreter [name] to help patient complete required application in [place].  Edwyna Shell, LCSW Clinical Social Worker Phone:  [phone]

## 2019-08-25 ENCOUNTER — Inpatient Hospital Stay: Payer: Self-pay

## 2019-08-25 ENCOUNTER — Encounter: Payer: Self-pay | Admitting: Oncology

## 2019-08-25 ENCOUNTER — Other Ambulatory Visit: Payer: Self-pay | Admitting: Oncology

## 2019-08-25 ENCOUNTER — Other Ambulatory Visit: Payer: Self-pay

## 2019-08-25 VITALS — BP 111/60 | HR 79 | Temp 97.8°F | Resp 16

## 2019-08-25 DIAGNOSIS — Z171 Estrogen receptor negative status [ER-]: Secondary | ICD-10-CM

## 2019-08-25 DIAGNOSIS — C50811 Malignant neoplasm of overlapping sites of right female breast: Secondary | ICD-10-CM

## 2019-08-25 DIAGNOSIS — Z95828 Presence of other vascular implants and grafts: Secondary | ICD-10-CM

## 2019-08-25 LAB — COMPREHENSIVE METABOLIC PANEL
ALT: 119 U/L — ABNORMAL HIGH (ref 0–44)
AST: 56 U/L — ABNORMAL HIGH (ref 15–41)
Albumin: 3.9 g/dL (ref 3.5–5.0)
Alkaline Phosphatase: 93 U/L (ref 38–126)
Anion gap: 10 (ref 5–15)
BUN: 12 mg/dL (ref 6–20)
CO2: 25 mmol/L (ref 22–32)
Calcium: 8.8 mg/dL — ABNORMAL LOW (ref 8.9–10.3)
Chloride: 107 mmol/L (ref 98–111)
Creatinine, Ser: 0.66 mg/dL (ref 0.44–1.00)
GFR calc Af Amer: >60 mL/min (ref >60)
GFR calc non Af Amer: >60 mL/min (ref >60)
Glucose, Bld: 122 mg/dL — ABNORMAL HIGH (ref 70–99)
Potassium: 3.8 mmol/L (ref 3.5–5.1)
Sodium: 142 mmol/L (ref 135–145)
Total Bilirubin: 0.4 mg/dL (ref 0.3–1.2)
Total Protein: 7.1 g/dL (ref 6.5–8.1)

## 2019-08-25 LAB — CBC WITH DIFFERENTIAL/PLATELET
Abs Immature Granulocytes: 0 10*3/uL (ref 0.00–0.07)
Basophils Absolute: 0 10*3/uL (ref 0.0–0.1)
Basophils Relative: 0 %
Eosinophils Absolute: 0 10*3/uL (ref 0.0–0.5)
Eosinophils Relative: 1 %
HCT: 27.2 % — ABNORMAL LOW (ref 36.0–46.0)
Hemoglobin: 9.1 g/dL — ABNORMAL LOW (ref 12.0–15.0)
Immature Granulocytes: 0 %
Lymphocytes Relative: 24 %
Lymphs Abs: 0.8 10*3/uL (ref 0.7–4.0)
MCH: 32.4 pg (ref 26.0–34.0)
MCHC: 33.5 g/dL (ref 30.0–36.0)
MCV: 96.8 fL (ref 80.0–100.0)
Monocytes Absolute: 0.5 10*3/uL (ref 0.1–1.0)
Monocytes Relative: 14 %
Neutro Abs: 2 10*3/uL (ref 1.7–7.7)
Neutrophils Relative %: 61 %
Platelets: 200 10*3/uL (ref 150–400)
RBC: 2.81 MIL/uL — ABNORMAL LOW (ref 3.87–5.11)
RDW: 18.1 % — ABNORMAL HIGH (ref 11.5–15.5)
WBC: 3.3 10*3/uL — ABNORMAL LOW (ref 4.0–10.5)
nRBC: 0 % (ref 0.0–0.2)

## 2019-08-25 LAB — PREGNANCY, URINE: Preg Test, Ur: NEGATIVE

## 2019-08-25 MED ORDER — SODIUM CHLORIDE 0.9 % IV SOLN
287.8000 mg | Freq: Once | INTRAVENOUS | Status: AC
  Administered 2019-08-25: 290 mg via INTRAVENOUS
  Filled 2019-08-25: qty 29

## 2019-08-25 MED ORDER — SODIUM CHLORIDE 0.9% FLUSH
10.0000 mL | INTRAVENOUS | Status: DC | PRN
  Administered 2019-08-25: 12:00:00 10 mL via INTRAVENOUS
  Filled 2019-08-25: qty 10

## 2019-08-25 MED ORDER — DEXAMETHASONE SODIUM PHOSPHATE 10 MG/ML IJ SOLN
10.0000 mg | Freq: Once | INTRAMUSCULAR | Status: AC
  Administered 2019-08-25: 10 mg via INTRAVENOUS

## 2019-08-25 MED ORDER — PALONOSETRON HCL INJECTION 0.25 MG/5ML
INTRAVENOUS | Status: AC
  Filled 2019-08-25: qty 5

## 2019-08-25 MED ORDER — SODIUM CHLORIDE 0.9% FLUSH
10.0000 mL | INTRAVENOUS | Status: DC | PRN
  Administered 2019-08-25: 10 mL
  Filled 2019-08-25: qty 10

## 2019-08-25 MED ORDER — DEXAMETHASONE SODIUM PHOSPHATE 10 MG/ML IJ SOLN
INTRAMUSCULAR | Status: AC
  Filled 2019-08-25: qty 1

## 2019-08-25 MED ORDER — DIPHENHYDRAMINE HCL 50 MG/ML IJ SOLN
25.0000 mg | Freq: Once | INTRAMUSCULAR | Status: AC
  Administered 2019-08-25: 25 mg via INTRAVENOUS

## 2019-08-25 MED ORDER — PALONOSETRON HCL INJECTION 0.25 MG/5ML
0.2500 mg | Freq: Once | INTRAVENOUS | Status: AC
  Administered 2019-08-25: 0.25 mg via INTRAVENOUS

## 2019-08-25 MED ORDER — FAMOTIDINE IN NACL 20-0.9 MG/50ML-% IV SOLN
20.0000 mg | Freq: Once | INTRAVENOUS | Status: AC
  Administered 2019-08-25: 20 mg via INTRAVENOUS

## 2019-08-25 MED ORDER — SODIUM CHLORIDE 0.9 % IV SOLN
Freq: Once | INTRAVENOUS | Status: AC
  Filled 2019-08-25: qty 250

## 2019-08-25 MED ORDER — HEPARIN SOD (PORK) LOCK FLUSH 100 UNIT/ML IV SOLN
500.0000 [IU] | Freq: Once | INTRAVENOUS | Status: AC | PRN
  Administered 2019-08-25: 500 [IU]
  Filled 2019-08-25: qty 5

## 2019-08-25 MED ORDER — FAMOTIDINE IN NACL 20-0.9 MG/50ML-% IV SOLN
INTRAVENOUS | Status: AC
  Filled 2019-08-25: qty 50

## 2019-08-25 MED ORDER — DIPHENHYDRAMINE HCL 50 MG/ML IJ SOLN
INTRAMUSCULAR | Status: AC
  Filled 2019-08-25: qty 1

## 2019-08-25 MED ORDER — SODIUM CHLORIDE 0.9 % IV SOLN
80.0000 mg/m2 | Freq: Once | INTRAVENOUS | Status: AC
  Administered 2019-08-25: 150 mg via INTRAVENOUS
  Filled 2019-08-25: qty 25

## 2019-08-25 NOTE — Progress Notes (Signed)
Per Dr Jana Hakim, ok to treat with ALT 119.

## 2019-08-25 NOTE — Progress Notes (Signed)
Patient turned in [place] and documents.  Placed in outgoing mail to [facility] office Attn:Customer Service    [address] [place] [address]

## 2019-08-25 NOTE — Progress Notes (Signed)
Met with patient at registration to provide a copy of Medicaid ineligibility letter and [place] for her to complete and return to be submitted to Customer Service. This is to determine if she is eligible for any more than the 56% uninsured discount for services billed through [facility].  She has my card for any additional financial questions or concerns.

## 2019-08-25 NOTE — Patient Instructions (Signed)
[facility] Discharge Instructions for Patients Receiving Chemotherapy  Today you received the following chemotherapy agents: paclitaxel and carboplatin.  To help prevent nausea and vomiting after your treatment, we encourage you to take your nausea medication as directed.   If you develop nausea and vomiting that is not controlled by your nausea medication, call the clinic.   BELOW ARE SYMPTOMS THAT SHOULD BE REPORTED IMMEDIATELY:  *FEVER GREATER THAN 100.5 F  *CHILLS WITH OR WITHOUT FEVER  NAUSEA AND VOMITING THAT IS NOT CONTROLLED WITH YOUR NAUSEA MEDICATION  *UNUSUAL SHORTNESS OF BREATH  *UNUSUAL BRUISING OR BLEEDING  TENDERNESS IN MOUTH AND THROAT WITH OR WITHOUT PRESENCE OF ULCERS  *URINARY PROBLEMS  *BOWEL PROBLEMS  UNUSUAL RASH Items with * indicate a potential emergency and should be followed up as soon as possible.  Feel free to call the clinic should you have any questions or concerns. The clinic phone number is [phone].  Please show the CHEMO ALERT CARD at check-in to the Emergency Department and triage nurse.   

## 2019-08-31 NOTE — Progress Notes (Signed)
[facility]  [phone] Fax:(336) [phone]     ID: Brianna Owens DOB: 05-08-1991  MR#: [id number]  ION#:[id number]  Patient Care Team: Patient, [name] as PCP - General (General Practice) Tesia Lybrand, [name], MD as Consulting Physician (Oncology) Erroll Luna, MD as Consulting Physician (General Surgery) [name], RN as Oncology Nurse Navigator [name], RN as Oncology Nurse Navigator Chauncey Cruel, MD OTHER MD:  CHIEF COMPLAINT: Triple negative breast cancer, BRCA1 positive  CURRENT TREATMENT: neoadjuvant chemotherapy   INTERVAL HISTORY: Brianna Owens returns today for follow up and treatment of her triple negative breast cancer.  She continues on weekly paclitacxel and carboplatin. Today is her sixth dose.  She has been tolerating treatment well.  She has no peripheral neuropathy.  She is having a little bit more nausea than before   REVIEW OF SYSTEMS: Brianna Owens continues to work 1 or 2 days a week.  She takes walks.  Otherwise she is pretty bored.  She tells me she has some nausea and less energy than before.  She is not having any hot flashes.  She has not had any periods even though her Lupron was delayed up for insurance reasons.  A detailed review of systems today was otherwise stable.   HISTORY OF CURRENT ILLNESS: From the original intake note:  [place] [facility] with a 3 month history of a right breast lump that became painful in early 04/2019. Physical exam performed at that time showed a palpable, tender 13 cm lump within the right center breast under the nipple area. She underwent right breast ultrasonography at [facility] on 04/27/2019 showing: 6.1 cm palpable mass centered in the 5 o'clock position of the right breast; single right inferomedial axillary lymph node with focal cortical thickening inferiorly.  Accordingly on 05/04/2019  she proceeded to biopsy of the right breast area in question. The pathology from this procedure ([id number]) showed: invasive ductal carcinoma, grade 2-3. Prognostic indicators significant for: estrogen receptor, 0% negative and progesterone receptor, 0% negative. Proliferation marker Ki67 at 40%.  I do not find HER-2 receptor documentation  The right axillary lymph node biopsied at that time showed reactive germinal centers.  She underwent bilateral diagnostic mammography with tomography at [facility] on 05/05/2019 showing: breast density category D; biopsy-proven right breast malignancy measures 6.1 cm; no additional masses or calcifications seen in the right breast; no evidence of malignancy in the left breast.  The patient's subsequent history is as detailed below.   PAST MEDICAL HISTORY: No past medical history on file.  PAST SURGICAL HISTORY: Past Surgical History:  Procedure Laterality Date   IR IMAGING GUIDED PORT INSERTION  05/24/2019    FAMILY HISTORY: Family History  Problem Relation Age of Onset   Breast cancer Mother    Breast cancer Maternal Aunt    Patient's father is 58 and her mother 5 as of November 2020.  The patient's mother was diagnosed with breast cancer in her early 85s.  She lives in [place]. The patient's mother's sister was also diagnosed with breast cancer in her early 74s.  The patient herself has 1 sister, no brothers.  She is not aware of any ovarian cancer cases in the family.   GYNECOLOGIC HISTORY:  No LMP recorded. Menarche: 29 years old Age at first live birth: 28 years old [place] P 2 LMPregular Contraceptive HRT n/a  Hysterectomy? no BSO? no   SOCIAL HISTORY: (  updated 04/2019)  Nandita is currently working at [employer].  She is originally from [place].  She is divorced.  Her children are Brianna Owens, 52 years old, living in [place] with his father, and Brianna Owens, 51 years old, who lives with the patient.  Also at home are her uncle [name], his wife, and that wife's cousin.     ADVANCED DIRECTIVES: Not in place   HEALTH MAINTENANCE: Social History   Tobacco Use   Smoking status: Never Smoker   Smokeless tobacco: Never Used  Substance Use Topics   Alcohol use: Not Currently   Drug use: Not Currently     Colonoscopy: n/a  PAP: 04/26/2019, negative  Bone density: n/a   No Known Allergies  Current Outpatient Medications  Medication Sig Dispense Refill   lidocaine-prilocaine (EMLA) cream Apply to affected area once 30 g 3   loratadine (CLARITIN) 10 MG tablet Take 1 tablet (10 mg total) by mouth daily. 60 tablet 0   prochlorperazine (COMPAZINE) 10 MG tablet Take 1 tablet (10 mg total) by mouth every 6 (six) hours as needed (Nausea or vomiting). 30 tablet 1   venlafaxine XR (EFFEXOR-XR) 75 MG 24 hr capsule Take 1 capsule (75 mg total) by mouth daily with breakfast. 30 capsule 4   No current facility-administered medications for this visit.    OBJECTIVE: Young Spanish speaker in no acute distress  Vitals:   09/01/19 1109  BP: 112/76  Pulse: 84  Resp: 18  Temp: 97.8 F (36.6 C)  SpO2: 100%     Body mass index is 24.21 kg/m.   Wt Readings from Last 3 Encounters:  09/01/19 159 lb 3.2 oz (72.2 kg)  08/18/19 159 lb 4.8 oz (72.3 kg)  08/05/19 155 lb 6.4 oz (70.5 kg)      ECOG FS:0 - Asymptomatic  Sclerae unicteric, EOMs intact Wearing a mask No cervical or supraclavicular adenopathy Lungs no rales or rhonchi Heart regular rate and rhythm Abd soft, nontender, positive bowel sounds MSK no focal spinal tenderness, no upper extremity lymphedema Neuro: nonfocal, well oriented, appropriate affect Breasts: I do not feel a well-defined mass in the right breast.  Left breast is benign.  Both axillae are benign.   LAB RESULTS:  CMP     Component Value Date/Time   NA 142 08/25/2019 1206   K 3.8 08/25/2019 1206   CL 107 08/25/2019 1206   CO2 25 08/25/2019 1206   GLUCOSE 122 (H) 08/25/2019  1206   BUN 12 08/25/2019 1206   CREATININE 0.66 08/25/2019 1206   CREATININE 0.45 07/21/2019 1025   CALCIUM 8.8 (L) 08/25/2019 1206   PROT 7.1 08/25/2019 1206   ALBUMIN 3.9 08/25/2019 1206   AST 56 (H) 08/25/2019 1206   AST 29 07/21/2019 1025   ALT 119 (H) 08/25/2019 1206   ALT 58 (H) 07/21/2019 1025   ALKPHOS 93 08/25/2019 1206   BILITOT 0.4 08/25/2019 1206   BILITOT 0.3 07/21/2019 1025   GFRNONAA >60 08/25/2019 1206   GFRNONAA >60 07/21/2019 1025   GFRAA >60 08/25/2019 1206   GFRAA >60 07/21/2019 1025    No results found for: TOTALPROTELP, ALBUMINELP, A1GS, A2GS, BETS, BETA2SER, GAMS, MSPIKE, SPEI  No results found for: KPAFRELGTCHN, LAMBDASER, KAPLAMBRATIO  Lab Results  Component Value Date   WBC 3.4 (L) 09/01/2019   NEUTROABS 2.1 09/01/2019   HGB 9.6 (L) 09/01/2019   HCT 28.0 (L) 09/01/2019   MCV 96.2 09/01/2019   PLT 243 09/01/2019    No results found  for: LABCA2  No components found for: [id number]  No results for input(s): INR in the last 168 hours.  No results found for: LABCA2  No results found for: WUJ811  No results found for: BJY782  No results found for: NFA213  No results found for: CA2729  No components found for: HGQUANT  No results found for: CEA1 / No results found for: CEA1   No results found for: AFPTUMOR  No results found for: CHROMOGRNA  No results found for: HGBA, HGBA2QUANT, HGBFQUANT, HGBSQUAN (Hemoglobinopathy evaluation)   No results found for: LDH  No results found for: IRON, TIBC, IRONPCTSAT (Iron and TIBC)  No results found for: FERRITIN  Urinalysis No results found for: COLORURINE, APPEARANCEUR, LABSPEC, PHURINE, GLUCOSEU, HGBUR, BILIRUBINUR, KETONESUR, PROTEINUR, UROBILINOGEN, NITRITE, LEUKOCYTESUR   STUDIES: No results found.   ELIGIBLE FOR AVAILABLE RESEARCH PROTOCOL:no  ASSESSMENT: 29 y.o. BRCA1 positive [name] woman status post right breast overlapping sites biopsy 05/04/2019 for a clinical T3 N0,  stage IIIB invasive ductal carcinoma, grade 2, triple negative, with an MIB-1 of 40%.  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  The Common Hereditary Gene Panel offered by [facility] includes sequencing and/or deletion duplication testing of the following 48 genes: APC, ATM, AXIN2, BARD1, BMPR1A, BRCA1, BRCA2, BRIP1, CDH1, CDK4, CDKN2A (p14ARF), CDKN2A (p16INK4a), CHEK2, CTNNA1, DICER1, EPCAM (Deletion/duplication testing only), GREM1 (promoter region deletion/duplication testing only), KIT, MEN1, MLH1, MSH2, MSH3, MSH6, MUTYH, NBN, NF1, NHTL1, PALB2, PDGFRA, PMS2, POLD1, POLE, PTEN, RAD50, RAD51C, RAD51D, RNF43, SDHB, SDHC, SDHD, SMAD4, SMARCA4. STK11, TP53, TSC1, TSC2, and VHL.  The following genes were evaluated for sequence changes only: SDHA and HOXB13 c.251G>A variant only. The report date is 05/24/2019.  (2) neoadjuvant chemotherapy consisting of doxorubicin and cyclophosphamide in dose dense fashion x4 started 05/26/2019, completed 07/07/2019, followed by paclitaxel and carboplatin weekly x12 started 07/21/2019  (3) definitive surgery to follow  (4) adjuvant radiation to follow  (5) consider bilateral salpingo-oophorectomy at the end of breast cancer treatment  (a) receiving Lupron every 28 days   PLAN: Corita will proceed to her 6 cycle of carboplatin and paclitaxel today unless that we get a surprise from her liver tests which are not yet available.  She is a little bit behind on her Lupron because of some insurance issues but she should be able to receive that today.  She has not had any periods.  She also is not having any hot flashes or vaginal dryness problems at present.  She understood somebody to have told her that the next treatment would be the last 1.  I reviewed that with her so she  understands she is scheduled for chemotherapy through the end of April.  After that she will of course have her surgery and then radiation.  She is very concerned that she will not have a ride daily.  It is hard for her to even get a ride once a week here.  We will have to work with our social workers to facilitate that when the time comes  At this point I do not feel a definitive mass in the right breast-- I think she will have a very good pathologic response to her treatment  At this point she does have some nausea and I am refilling her Compazine  Total encounter time 30 minutes.[name] Jews C. Chong Wojdyla, MD  09/01/19 11:25 AM Medical Oncology and Hematology [facility] [place], [place] [address] Tel. [phone]    Fax. [phone]   I, Wilburn Mylar, am acting as scribe for Dr. [name]. Mitchael Luckey.  I, Lurline Del MD, have reviewed the above documentation for accuracy and completeness, and I agree with the above.    *Total Encounter Time as defined by the Centers for Medicare and Medicaid Services includes, in addition to the face-to-face time of a patient visit (documented in the note above) non-face-to-face time: obtaining and reviewing outside history, ordering and reviewing medications, tests or procedures, care coordination (communications with other health care professionals or caregivers) and documentation in the medical record.   ADDENDUM: [place] continues to have some transaminitis.  I am going to drop the Taxol dose by 20%.

## 2019-09-01 ENCOUNTER — Inpatient Hospital Stay: Payer: No Typology Code available for payment source

## 2019-09-01 ENCOUNTER — Other Ambulatory Visit: Payer: Self-pay

## 2019-09-01 ENCOUNTER — Encounter: Payer: Self-pay | Admitting: *Deleted

## 2019-09-01 ENCOUNTER — Inpatient Hospital Stay (HOSPITAL_BASED_OUTPATIENT_CLINIC_OR_DEPARTMENT_OTHER): Payer: Self-pay | Admitting: Oncology

## 2019-09-01 VITALS — BP 112/76 | HR 84 | Temp 97.8°F | Resp 18 | Ht 68.0 in | Wt 159.2 lb

## 2019-09-01 DIAGNOSIS — Z95828 Presence of other vascular implants and grafts: Secondary | ICD-10-CM

## 2019-09-01 DIAGNOSIS — C50811 Malignant neoplasm of overlapping sites of right female breast: Secondary | ICD-10-CM

## 2019-09-01 DIAGNOSIS — Z1501 Genetic susceptibility to malignant neoplasm of breast: Secondary | ICD-10-CM

## 2019-09-01 DIAGNOSIS — K760 Fatty (change of) liver, not elsewhere classified: Secondary | ICD-10-CM

## 2019-09-01 DIAGNOSIS — Z1502 Genetic susceptibility to malignant neoplasm of ovary: Secondary | ICD-10-CM

## 2019-09-01 DIAGNOSIS — Z171 Estrogen receptor negative status [ER-]: Secondary | ICD-10-CM

## 2019-09-01 LAB — CBC WITH DIFFERENTIAL/PLATELET
Abs Immature Granulocytes: 0.01 10*3/uL (ref 0.00–0.07)
Basophils Absolute: 0 10*3/uL (ref 0.0–0.1)
Basophils Relative: 1 %
Eosinophils Absolute: 0.1 10*3/uL (ref 0.0–0.5)
Eosinophils Relative: 2 %
HCT: 28 % — ABNORMAL LOW (ref 36.0–46.0)
Hemoglobin: 9.6 g/dL — ABNORMAL LOW (ref 12.0–15.0)
Immature Granulocytes: 0 %
Lymphocytes Relative: 28 %
Lymphs Abs: 0.9 10*3/uL (ref 0.7–4.0)
MCH: 33 pg (ref 26.0–34.0)
MCHC: 34.3 g/dL (ref 30.0–36.0)
MCV: 96.2 fL (ref 80.0–100.0)
Monocytes Absolute: 0.3 10*3/uL (ref 0.1–1.0)
Monocytes Relative: 9 %
Neutro Abs: 2.1 10*3/uL (ref 1.7–7.7)
Neutrophils Relative %: 60 %
Platelets: 243 10*3/uL (ref 150–400)
RBC: 2.91 MIL/uL — ABNORMAL LOW (ref 3.87–5.11)
RDW: 16.1 % — ABNORMAL HIGH (ref 11.5–15.5)
WBC: 3.4 10*3/uL — ABNORMAL LOW (ref 4.0–10.5)
nRBC: 0 % (ref 0.0–0.2)

## 2019-09-01 LAB — COMPREHENSIVE METABOLIC PANEL
ALT: 169 U/L — ABNORMAL HIGH (ref 0–44)
AST: 88 U/L — ABNORMAL HIGH (ref 15–41)
Albumin: 4.1 g/dL (ref 3.5–5.0)
Alkaline Phosphatase: 105 U/L (ref 38–126)
Anion gap: 9 (ref 5–15)
BUN: 10 mg/dL (ref 6–20)
CO2: 24 mmol/L (ref 22–32)
Calcium: 9.2 mg/dL (ref 8.9–10.3)
Chloride: 106 mmol/L (ref 98–111)
Creatinine, Ser: 0.54 mg/dL (ref 0.44–1.00)
GFR calc Af Amer: >60 mL/min (ref >60)
GFR calc non Af Amer: >60 mL/min (ref >60)
Glucose, Bld: 100 mg/dL — ABNORMAL HIGH (ref 70–99)
Potassium: 4.1 mmol/L (ref 3.5–5.1)
Sodium: 139 mmol/L (ref 135–145)
Total Bilirubin: 0.4 mg/dL (ref 0.3–1.2)
Total Protein: 7.5 g/dL (ref 6.5–8.1)

## 2019-09-01 LAB — PREGNANCY, URINE: Preg Test, Ur: NEGATIVE

## 2019-09-01 MED ORDER — DEXAMETHASONE SODIUM PHOSPHATE 10 MG/ML IJ SOLN
INTRAMUSCULAR | Status: AC
  Filled 2019-09-01: qty 1

## 2019-09-01 MED ORDER — FAMOTIDINE IN NACL 20-0.9 MG/50ML-% IV SOLN
INTRAVENOUS | Status: AC
  Filled 2019-09-01: qty 50

## 2019-09-01 MED ORDER — SODIUM CHLORIDE 0.9 % IV SOLN
65.0000 mg/m2 | Freq: Once | INTRAVENOUS | Status: AC
  Administered 2019-09-01: 14:00:00 120 mg via INTRAVENOUS
  Filled 2019-09-01: qty 20

## 2019-09-01 MED ORDER — HEPARIN SOD (PORK) LOCK FLUSH 100 UNIT/ML IV SOLN
500.0000 [IU] | Freq: Once | INTRAVENOUS | Status: AC | PRN
  Administered 2019-09-01: 16:00:00 500 [IU]
  Filled 2019-09-01: qty 5

## 2019-09-01 MED ORDER — DEXAMETHASONE SODIUM PHOSPHATE 10 MG/ML IJ SOLN
10.0000 mg | Freq: Once | INTRAMUSCULAR | Status: AC
  Administered 2019-09-01: 13:00:00 10 mg via INTRAVENOUS

## 2019-09-01 MED ORDER — SODIUM CHLORIDE 0.9% FLUSH
10.0000 mL | INTRAVENOUS | Status: DC | PRN
  Administered 2019-09-01: 10 mL via INTRAVENOUS
  Filled 2019-09-01: qty 10

## 2019-09-01 MED ORDER — PALONOSETRON HCL INJECTION 0.25 MG/5ML
0.2500 mg | Freq: Once | INTRAVENOUS | Status: AC
  Administered 2019-09-01: 13:00:00 0.25 mg via INTRAVENOUS

## 2019-09-01 MED ORDER — SODIUM CHLORIDE 0.9 % IV SOLN
Freq: Once | INTRAVENOUS | Status: AC
  Filled 2019-09-01: qty 250

## 2019-09-01 MED ORDER — HEPARIN SOD (PORK) LOCK FLUSH 100 UNIT/ML IV SOLN
500.0000 [IU] | Freq: Once | INTRAVENOUS | Status: DC
  Filled 2019-09-01: qty 5

## 2019-09-01 MED ORDER — SODIUM CHLORIDE 0.9% FLUSH
10.0000 mL | INTRAVENOUS | Status: DC | PRN
  Administered 2019-09-01: 16:00:00 10 mL
  Filled 2019-09-01: qty 10

## 2019-09-01 MED ORDER — PALONOSETRON HCL INJECTION 0.25 MG/5ML
INTRAVENOUS | Status: AC
  Filled 2019-09-01: qty 5

## 2019-09-01 MED ORDER — DIPHENHYDRAMINE HCL 50 MG/ML IJ SOLN
INTRAMUSCULAR | Status: AC
  Filled 2019-09-01: qty 1

## 2019-09-01 MED ORDER — SODIUM CHLORIDE 0.9 % IV SOLN
287.8000 mg | Freq: Once | INTRAVENOUS | Status: AC
  Administered 2019-09-01: 15:00:00 290 mg via INTRAVENOUS
  Filled 2019-09-01: qty 29

## 2019-09-01 MED ORDER — DIPHENHYDRAMINE HCL 50 MG/ML IJ SOLN
25.0000 mg | Freq: Once | INTRAMUSCULAR | Status: AC
  Administered 2019-09-01: 25 mg via INTRAVENOUS

## 2019-09-01 MED ORDER — PROCHLORPERAZINE MALEATE 10 MG PO TABS: 10.0000 mg | ORAL_TABLET | Freq: Four times a day (QID) | ORAL | 1 refills | Status: DC | PRN

## 2019-09-01 MED ORDER — FAMOTIDINE IN NACL 20-0.9 MG/50ML-% IV SOLN
20.0000 mg | Freq: Once | INTRAVENOUS | Status: AC
  Administered 2019-09-01: 13:00:00 20 mg via INTRAVENOUS

## 2019-09-01 NOTE — Patient Instructions (Addendum)
[facility] Discharge Instructions for Patients Receiving Chemotherapy  Today you received the following chemotherapy agents: paclitaxel and carboplatin.  To help prevent nausea and vomiting after your treatment, we encourage you to take your nausea medication as directed.   If you develop nausea and vomiting that is not controlled by your nausea medication, call the clinic.   BELOW ARE SYMPTOMS THAT SHOULD BE REPORTED IMMEDIATELY:  *FEVER GREATER THAN 100.5 F  *CHILLS WITH OR WITHOUT FEVER  NAUSEA AND VOMITING THAT IS NOT CONTROLLED WITH YOUR NAUSEA MEDICATION  *UNUSUAL SHORTNESS OF BREATH  *UNUSUAL BRUISING OR BLEEDING  TENDERNESS IN MOUTH AND THROAT WITH OR WITHOUT PRESENCE OF ULCERS  *URINARY PROBLEMS  *BOWEL PROBLEMS  UNUSUAL RASH Items with * indicate a potential emergency and should be followed up as soon as possible.  Feel free to call the clinic should you have any questions or concerns. The clinic phone number is [phone].  Please show the CHEMO ALERT CARD at check-in to the Emergency Department and triage nurse.   

## 2019-09-01 NOTE — Progress Notes (Signed)
Per Dr Jana Hakim, ok to treat with elevated LFTs. Will dose-reduce chemotherapy.

## 2019-09-02 ENCOUNTER — Encounter: Payer: Self-pay | Admitting: Pharmacy Technician

## 2019-09-02 NOTE — Progress Notes (Signed)
Pharmacist Chemotherapy Monitoring - Follow Up Assessment    I verify that I have reviewed each item in the below checklist:  . Regimen for the patient is scheduled for the appropriate day and plan matches scheduled date. Marland Kitchen Appropriate non-routine labs are ordered dependent on drug ordered. . If applicable, additional medications reviewed and ordered per protocol based on lifetime cumulative doses and/or treatment regimen.   Plan for follow-up and/or issues identified: No . I-vent associated with next due treatment: No . MD and/or nursing notified: No  Brianna Owens 09/02/2019 1:34 PM

## 2019-09-02 NOTE — Progress Notes (Signed)
Patient has been approved for drug assistance by [employer] for Lupron. The enrollment period is from 09/01/19-08/31/20 based on self pay. First DOS covered is 09/08/19.

## 2019-09-05 ENCOUNTER — Telehealth: Payer: Self-pay | Admitting: Oncology

## 2019-09-05 NOTE — Telephone Encounter (Signed)
No changes made to pt's schedule. Pt was scheduled more than three treatments out.

## 2019-09-08 ENCOUNTER — Other Ambulatory Visit: Payer: Self-pay

## 2019-09-08 ENCOUNTER — Inpatient Hospital Stay: Payer: No Typology Code available for payment source

## 2019-09-08 ENCOUNTER — Other Ambulatory Visit: Payer: Self-pay | Admitting: Oncology

## 2019-09-08 VITALS — BP 112/63 | HR 80 | Temp 98.1°F | Resp 18

## 2019-09-08 DIAGNOSIS — Z01419 Encounter for gynecological examination (general) (routine) without abnormal findings: Secondary | ICD-10-CM

## 2019-09-08 DIAGNOSIS — Z95828 Presence of other vascular implants and grafts: Secondary | ICD-10-CM

## 2019-09-08 DIAGNOSIS — C50811 Malignant neoplasm of overlapping sites of right female breast: Secondary | ICD-10-CM

## 2019-09-08 LAB — COMPREHENSIVE METABOLIC PANEL
ALT: 168 U/L — ABNORMAL HIGH (ref 0–44)
AST: 76 U/L — ABNORMAL HIGH (ref 15–41)
Albumin: 4 g/dL (ref 3.5–5.0)
Alkaline Phosphatase: 102 U/L (ref 38–126)
Anion gap: 9 (ref 5–15)
BUN: 10 mg/dL (ref 6–20)
CO2: 26 mmol/L (ref 22–32)
Calcium: 9.6 mg/dL (ref 8.9–10.3)
Chloride: 107 mmol/L (ref 98–111)
Creatinine, Ser: 0.57 mg/dL (ref 0.44–1.00)
GFR calc Af Amer: >60 mL/min (ref >60)
GFR calc non Af Amer: >60 mL/min (ref >60)
Glucose, Bld: 135 mg/dL — ABNORMAL HIGH (ref 70–99)
Potassium: 3.7 mmol/L (ref 3.5–5.1)
Sodium: 142 mmol/L (ref 135–145)
Total Bilirubin: 0.4 mg/dL (ref 0.3–1.2)
Total Protein: 7.4 g/dL (ref 6.5–8.1)

## 2019-09-08 LAB — CBC WITH DIFFERENTIAL/PLATELET
Abs Immature Granulocytes: 0 10*3/uL (ref 0.00–0.07)
Basophils Absolute: 0 10*3/uL (ref 0.0–0.1)
Basophils Relative: 0 %
Eosinophils Absolute: 0 10*3/uL (ref 0.0–0.5)
Eosinophils Relative: 1 %
HCT: 26.5 % — ABNORMAL LOW (ref 36.0–46.0)
Hemoglobin: 9.1 g/dL — ABNORMAL LOW (ref 12.0–15.0)
Immature Granulocytes: 0 %
Lymphocytes Relative: 32 %
Lymphs Abs: 0.9 10*3/uL (ref 0.7–4.0)
MCH: 33.6 pg (ref 26.0–34.0)
MCHC: 34.3 g/dL (ref 30.0–36.0)
MCV: 97.8 fL (ref 80.0–100.0)
Monocytes Absolute: 0.2 10*3/uL (ref 0.1–1.0)
Monocytes Relative: 9 %
Neutro Abs: 1.6 10*3/uL — ABNORMAL LOW (ref 1.7–7.7)
Neutrophils Relative %: 58 %
Platelets: 317 10*3/uL (ref 150–400)
RBC: 2.71 MIL/uL — ABNORMAL LOW (ref 3.87–5.11)
RDW: 15.4 % (ref 11.5–15.5)
WBC: 2.7 10*3/uL — ABNORMAL LOW (ref 4.0–10.5)
nRBC: 0 % (ref 0.0–0.2)

## 2019-09-08 LAB — PREGNANCY, URINE: Preg Test, Ur: NEGATIVE

## 2019-09-08 MED ORDER — DEXAMETHASONE SODIUM PHOSPHATE 10 MG/ML IJ SOLN
INTRAMUSCULAR | Status: AC
  Filled 2019-09-08: qty 1

## 2019-09-08 MED ORDER — SODIUM CHLORIDE 0.9 % IV SOLN
287.8000 mg | Freq: Once | INTRAVENOUS | Status: AC
  Administered 2019-09-08: 290 mg via INTRAVENOUS
  Filled 2019-09-08: qty 29

## 2019-09-08 MED ORDER — PALONOSETRON HCL INJECTION 0.25 MG/5ML
0.2500 mg | Freq: Once | INTRAVENOUS | Status: AC
  Administered 2019-09-08: 0.25 mg via INTRAVENOUS

## 2019-09-08 MED ORDER — HEPARIN SOD (PORK) LOCK FLUSH 100 UNIT/ML IV SOLN
500.0000 [IU] | Freq: Once | INTRAVENOUS | Status: AC | PRN
  Administered 2019-09-08: 500 [IU]
  Filled 2019-09-08: qty 5

## 2019-09-08 MED ORDER — DEXAMETHASONE SODIUM PHOSPHATE 10 MG/ML IJ SOLN
10.0000 mg | Freq: Once | INTRAMUSCULAR | Status: AC
  Administered 2019-09-08: 10 mg via INTRAVENOUS

## 2019-09-08 MED ORDER — SODIUM CHLORIDE 0.9% FLUSH
10.0000 mL | INTRAVENOUS | Status: DC | PRN
  Administered 2019-09-08: 10 mL via INTRAVENOUS
  Filled 2019-09-08: qty 10

## 2019-09-08 MED ORDER — SODIUM CHLORIDE 0.9 % IV SOLN
65.0000 mg/m2 | Freq: Once | INTRAVENOUS | Status: AC
  Administered 2019-09-08: 120 mg via INTRAVENOUS
  Filled 2019-09-08: qty 20

## 2019-09-08 MED ORDER — HEPARIN SOD (PORK) LOCK FLUSH 100 UNIT/ML IV SOLN
500.0000 [IU] | Freq: Once | INTRAVENOUS | Status: DC
  Filled 2019-09-08: qty 5

## 2019-09-08 MED ORDER — FAMOTIDINE IN NACL 20-0.9 MG/50ML-% IV SOLN
INTRAVENOUS | Status: AC
  Filled 2019-09-08: qty 50

## 2019-09-08 MED ORDER — DIPHENHYDRAMINE HCL 50 MG/ML IJ SOLN
25.0000 mg | Freq: Once | INTRAMUSCULAR | Status: AC
  Administered 2019-09-08: 25 mg via INTRAVENOUS

## 2019-09-08 MED ORDER — FAMOTIDINE IN NACL 20-0.9 MG/50ML-% IV SOLN
20.0000 mg | Freq: Once | INTRAVENOUS | Status: AC
  Administered 2019-09-08: 20 mg via INTRAVENOUS

## 2019-09-08 MED ORDER — DIPHENHYDRAMINE HCL 50 MG/ML IJ SOLN
INTRAMUSCULAR | Status: AC
  Filled 2019-09-08: qty 1

## 2019-09-08 MED ORDER — PALONOSETRON HCL INJECTION 0.25 MG/5ML
INTRAVENOUS | Status: AC
  Filled 2019-09-08: qty 5

## 2019-09-08 MED ORDER — SODIUM CHLORIDE 0.9% FLUSH
10.0000 mL | INTRAVENOUS | Status: DC | PRN
  Administered 2019-09-08: 10 mL
  Filled 2019-09-08: qty 10

## 2019-09-08 MED ORDER — SODIUM CHLORIDE 0.9 % IV SOLN
Freq: Once | INTRAVENOUS | Status: AC
  Filled 2019-09-08: qty 250

## 2019-09-08 MED ORDER — LEUPROLIDE ACETATE 3.75 MG IM KIT
3.7500 mg | PACK | Freq: Once | INTRAMUSCULAR | Status: AC
  Administered 2019-09-08: 3.75 mg via INTRAMUSCULAR
  Filled 2019-09-08: qty 3.75

## 2019-09-08 NOTE — Patient Instructions (Signed)
[facility] Discharge Instructions for Patients Receiving Chemotherapy  Today you received the following chemotherapy agents: paclitaxel and carboplatin.  To help prevent nausea and vomiting after your treatment, we encourage you to take your nausea medication as directed.   If you develop nausea and vomiting that is not controlled by your nausea medication, call the clinic.   BELOW ARE SYMPTOMS THAT SHOULD BE REPORTED IMMEDIATELY:  *FEVER GREATER THAN 100.5 F  *CHILLS WITH OR WITHOUT FEVER  NAUSEA AND VOMITING THAT IS NOT CONTROLLED WITH YOUR NAUSEA MEDICATION  *UNUSUAL SHORTNESS OF BREATH  *UNUSUAL BRUISING OR BLEEDING  TENDERNESS IN MOUTH AND THROAT WITH OR WITHOUT PRESENCE OF ULCERS  *URINARY PROBLEMS  *BOWEL PROBLEMS  UNUSUAL RASH Items with * indicate a potential emergency and should be followed up as soon as possible.  Feel free to call the clinic should you have any questions or concerns. The clinic phone number is [phone].  Please show the CHEMO ALERT CARD at check-in to the Emergency Department and triage nurse.   

## 2019-09-08 NOTE — Progress Notes (Signed)
OK to treat with AST/ALT today per MD Magrinat

## 2019-09-09 NOTE — Progress Notes (Signed)
Pharmacist Chemotherapy Monitoring - Follow Up Assessment    I verify that I have reviewed each item in the below checklist:  . Regimen for the patient is scheduled for the appropriate day and plan matches scheduled date. Marland Kitchen Appropriate non-routine labs are ordered dependent on drug ordered. . If applicable, additional medications reviewed and ordered per protocol based on lifetime cumulative doses and/or treatment regimen.   Plan for follow-up and/or issues identified: No . I-vent associated with next due treatment: Yes . MD and/or nursing notified: No    Kennith Center, Pharm.D., CPP 09/09/2019@4 :20 PM

## 2019-09-15 ENCOUNTER — Inpatient Hospital Stay: Payer: No Typology Code available for payment source

## 2019-09-15 ENCOUNTER — Other Ambulatory Visit: Payer: Self-pay

## 2019-09-15 ENCOUNTER — Inpatient Hospital Stay (HOSPITAL_BASED_OUTPATIENT_CLINIC_OR_DEPARTMENT_OTHER): Payer: Self-pay | Admitting: Adult Health

## 2019-09-15 ENCOUNTER — Encounter: Payer: Self-pay | Admitting: Adult Health

## 2019-09-15 ENCOUNTER — Inpatient Hospital Stay: Payer: No Typology Code available for payment source | Attending: Oncology

## 2019-09-15 VITALS — BP 125/76 | HR 81 | Temp 98.2°F | Resp 20 | Ht 68.0 in | Wt 164.1 lb

## 2019-09-15 DIAGNOSIS — Z1501 Genetic susceptibility to malignant neoplasm of breast: Secondary | ICD-10-CM | POA: Insufficient documentation

## 2019-09-15 DIAGNOSIS — Z95828 Presence of other vascular implants and grafts: Secondary | ICD-10-CM

## 2019-09-15 DIAGNOSIS — C50811 Malignant neoplasm of overlapping sites of right female breast: Secondary | ICD-10-CM

## 2019-09-15 DIAGNOSIS — Z171 Estrogen receptor negative status [ER-]: Secondary | ICD-10-CM

## 2019-09-15 DIAGNOSIS — C50311 Malignant neoplasm of lower-inner quadrant of right female breast: Secondary | ICD-10-CM | POA: Insufficient documentation

## 2019-09-15 DIAGNOSIS — Z9013 Acquired absence of bilateral breasts and nipples: Secondary | ICD-10-CM | POA: Insufficient documentation

## 2019-09-15 DIAGNOSIS — Z803 Family history of malignant neoplasm of breast: Secondary | ICD-10-CM | POA: Insufficient documentation

## 2019-09-15 DIAGNOSIS — Z5111 Encounter for antineoplastic chemotherapy: Secondary | ICD-10-CM | POA: Insufficient documentation

## 2019-09-15 DIAGNOSIS — Z79899 Other long term (current) drug therapy: Secondary | ICD-10-CM | POA: Insufficient documentation

## 2019-09-15 DIAGNOSIS — Z8541 Personal history of malignant neoplasm of cervix uteri: Secondary | ICD-10-CM | POA: Insufficient documentation

## 2019-09-15 DIAGNOSIS — R748 Abnormal levels of other serum enzymes: Secondary | ICD-10-CM | POA: Insufficient documentation

## 2019-09-15 LAB — CBC WITH DIFFERENTIAL/PLATELET
Abs Immature Granulocytes: 0.07 10*3/uL (ref 0.00–0.07)
Basophils Absolute: 0 10*3/uL (ref 0.0–0.1)
Basophils Relative: 1 %
Eosinophils Absolute: 0 10*3/uL (ref 0.0–0.5)
Eosinophils Relative: 1 %
HCT: 27 % — ABNORMAL LOW (ref 36.0–46.0)
Hemoglobin: 9.3 g/dL — ABNORMAL LOW (ref 12.0–15.0)
Immature Granulocytes: 2 %
Lymphocytes Relative: 27 %
Lymphs Abs: 0.9 10*3/uL (ref 0.7–4.0)
MCH: 33.9 pg (ref 26.0–34.0)
MCHC: 34.4 g/dL (ref 30.0–36.0)
MCV: 98.5 fL (ref 80.0–100.0)
Monocytes Absolute: 0.3 10*3/uL (ref 0.1–1.0)
Monocytes Relative: 10 %
Neutro Abs: 2.1 10*3/uL (ref 1.7–7.7)
Neutrophils Relative %: 59 %
Platelets: 254 10*3/uL (ref 150–400)
RBC: 2.74 MIL/uL — ABNORMAL LOW (ref 3.87–5.11)
RDW: 15 % (ref 11.5–15.5)
WBC: 3.4 10*3/uL — ABNORMAL LOW (ref 4.0–10.5)
nRBC: 0 % (ref 0.0–0.2)

## 2019-09-15 LAB — COMPREHENSIVE METABOLIC PANEL
ALT: 180 U/L — ABNORMAL HIGH (ref 0–44)
AST: 82 U/L — ABNORMAL HIGH (ref 15–41)
Albumin: 4 g/dL (ref 3.5–5.0)
Alkaline Phosphatase: 115 U/L (ref 38–126)
Anion gap: 10 (ref 5–15)
BUN: 8 mg/dL (ref 6–20)
CO2: 22 mmol/L (ref 22–32)
Calcium: 9.2 mg/dL (ref 8.9–10.3)
Chloride: 107 mmol/L (ref 98–111)
Creatinine, Ser: 0.6 mg/dL (ref 0.44–1.00)
GFR calc Af Amer: >60 mL/min (ref >60)
GFR calc non Af Amer: >60 mL/min (ref >60)
Glucose, Bld: 130 mg/dL — ABNORMAL HIGH (ref 70–99)
Potassium: 3.7 mmol/L (ref 3.5–5.1)
Sodium: 139 mmol/L (ref 135–145)
Total Bilirubin: 0.3 mg/dL (ref 0.3–1.2)
Total Protein: 7.4 g/dL (ref 6.5–8.1)

## 2019-09-15 LAB — PREGNANCY, URINE: Preg Test, Ur: NEGATIVE

## 2019-09-15 MED ORDER — SODIUM CHLORIDE 0.9% FLUSH
10.0000 mL | INTRAVENOUS | Status: DC | PRN
  Administered 2019-09-15: 10 mL via INTRAVENOUS
  Filled 2019-09-15: qty 10

## 2019-09-15 MED ORDER — HEPARIN SOD (PORK) LOCK FLUSH 100 UNIT/ML IV SOLN
500.0000 [IU] | Freq: Once | INTRAVENOUS | Status: AC
  Administered 2019-09-15: 500 [IU] via INTRAVENOUS
  Filled 2019-09-15: qty 5

## 2019-09-15 MED ORDER — HEPARIN SOD (PORK) LOCK FLUSH 100 UNIT/ML IV SOLN
500.0000 [IU] | Freq: Once | INTRAVENOUS | Status: DC
  Filled 2019-09-15: qty 5

## 2019-09-15 NOTE — Progress Notes (Signed)
[place]  Telephone:(336) [phone] Fax:(336) [phone]     ID: Brianna Owens DOB: [birth year]  MR#: [id number]  HQR#:[id number]  Patient Care Team: Patient, [name] as PCP - General (General Practice) Magrinat, [name], MD as Consulting Physician (Oncology) Erroll Luna, MD as Consulting Physician (General Surgery) [name], RN as Oncology Nurse Navigator [name], RN as Oncology Nurse Navigator Scot Dock, NP OTHER MD:  CHIEF COMPLAINT: Triple negative breast cancer, BRCA1 positive  CURRENT TREATMENT: neoadjuvant chemotherapy   INTERVAL HISTORY: Brianna Owens returns today for follow up and treatment of her triple negative breast cancer with spanish interpreter.    She continues on weekly paclitacxel and carboplatin. Today is week #8.  She has been tolerating treatment well.  She has no peripheral neuropathy.    REVIEW OF SYSTEMS: Brianna Owens is doing moderately well today.  She is working 12 hours per day at [employer].  She denies any new issues like fever, chills, chest pain, palpitations, cough, shortness of breath, bowel/bladder changes, headaches, vision issues, nausea, vomiting, pain, or any other concerns.  A detailed ROS was otherwise non contributory.    HISTORY OF CURRENT ILLNESS: From the original intake note:  [place] presented [facility] with a 3 month history of a right breast lump that became painful in early 04/2019. Physical exam performed at that time showed a palpable, tender 13 cm lump within the right center breast under the nipple area. She underwent right breast ultrasonography at [facility] on 04/27/2019 showing: 6.1 cm palpable mass centered in the 5 o'clock position of the right breast; single right inferomedial axillary lymph node with focal cortical thickening inferiorly.  Accordingly on 05/04/2019 she proceeded to biopsy of the right breast  area in question. The pathology from this procedure ([id number]) showed: invasive ductal carcinoma, grade 2-3. Prognostic indicators significant for: estrogen receptor, 0% negative and progesterone receptor, 0% negative. Proliferation marker Ki67 at 40%.  I do not find HER-2 receptor documentation  The right axillary lymph node biopsied at that time showed reactive germinal centers.  She underwent bilateral diagnostic mammography with tomography at [facility] on 05/05/2019 showing: breast density category D; biopsy-proven right breast malignancy measures 6.1 cm; no additional masses or calcifications seen in the right breast; no evidence of malignancy in the left breast.  The patient's subsequent history is as detailed below.   PAST MEDICAL HISTORY: No past medical history on file.  PAST SURGICAL HISTORY: Past Surgical History:  Procedure Laterality Date  . IR IMAGING GUIDED PORT INSERTION  05/24/2019    FAMILY HISTORY: Family History  Problem Relation Age of Onset  . Breast cancer Mother   . Breast cancer Maternal Aunt    Patient's father is 45 and her mother 68 as of November 2020.  The patient's mother was diagnosed with breast cancer in her early 26s.  She lives in [place]. The patient's mother's sister was also diagnosed with breast cancer in her early 21s.  The patient herself has 1 sister, no brothers.  She is not aware of any ovarian cancer cases in the family.   GYNECOLOGIC HISTORY:  No LMP recorded. Menarche: 29 years old Age at first live birth: 29 years old [place] P 2 LMPregular Contraceptive HRT n/a  Hysterectomy? no BSO? no   SOCIAL HISTORY: (updated 04/2019)  Brianna Owens is currently working at [employer].  She is originally from [place].  She is divorced.  Her children are [name], 38 years old, living in [place] with his father, and [name], 62 years old, who lives with the patient.  Also at home are her uncle [name], his wife, and that wife's cousin.      ADVANCED DIRECTIVES: Not in place   HEALTH MAINTENANCE: Social History   Tobacco Use  . Smoking status: Never Smoker  . Smokeless tobacco: Never Used  Substance Use Topics  . Alcohol use: Not Currently  . Drug use: Not Currently     Colonoscopy: n/a  PAP: 04/26/2019, negative  Bone density: n/a   No Known Allergies  Current Outpatient Medications  Medication Sig Dispense Refill  . lidocaine-prilocaine (EMLA) cream Apply to affected area once 30 g 3  . loratadine (CLARITIN) 10 MG tablet Take 1 tablet (10 mg total) by mouth daily. 60 tablet 0  . prochlorperazine (COMPAZINE) 10 MG tablet Take 1 tablet (10 mg total) by mouth every 6 (six) hours as needed (Nausea or vomiting). 30 tablet 1  . venlafaxine XR (EFFEXOR-XR) 75 MG 24 hr capsule Take 1 capsule (75 mg total) by mouth daily with breakfast. 30 capsule 4   No current facility-administered medications for this visit.   Facility-Administered Medications Ordered in Other Visits  Medication Dose Route Frequency Provider Last Rate Last Admin  . heparin lock flush 100 unit/mL  500 Units Intravenous Once Magrinat, [name], MD      . sodium chloride flush (NS) 0.9 % injection 10 mL  10 mL Intravenous PRN Magrinat, [name], MD   10 mL at 09/15/19 1258    OBJECTIVE: Young Spanish speaker in no acute distress  There were no vitals filed for this visit.   There is no height or weight on file to calculate BMI.   Wt Readings from Last 3 Encounters:  09/01/19 159 lb 3.2 oz (72.2 kg)  08/18/19 159 lb 4.8 oz (72.3 kg)  08/05/19 155 lb 6.4 oz (70.5 kg)      ECOG FS:0 - Asymptomatic GENERAL: Patient is a well appearing female in no acute distress HEENT:  Sclerae anicteric.Mask in place.  Neck is supple.  NODES:  No cervical, supraclavicular, or axillary lymphadenopathy palpated.  BREAST EXAM:  Deferred. LUNGS:  Clear to auscultation bilaterally.  No wheezes or rhonchi. HEART:  Regular rate and rhythm. No murmur  appreciated. ABDOMEN:  Soft, nontender.  Positive, normoactive bowel sounds. No organomegaly palpated. MSK:  No focal spinal tenderness to palpation. Full range of motion bilaterally in the upper extremities. EXTREMITIES:  No peripheral edema.   SKIN:  Clear with no obvious rashes or skin changes. + nail discoloration on fingernails noted NEURO:  Nonfocal. Well oriented.  Appropriate affect.    LAB RESULTS:  CMP     Component Value Date/Time   NA 142 09/08/2019 1222   K 3.7 09/08/2019 1222   CL 107 09/08/2019 1222   CO2 26 09/08/2019 1222   GLUCOSE 135 (H) 09/08/2019 1222   BUN 10 09/08/2019 1222   CREATININE 0.57 09/08/2019 1222   CREATININE 0.45 07/21/2019 1025   CALCIUM 9.6 09/08/2019 1222   PROT 7.4 09/08/2019 1222   ALBUMIN 4.0 09/08/2019 1222   AST 76 (H) 09/08/2019 1222   AST 29 07/21/2019 1025   ALT 168 (H) 09/08/2019 1222   ALT 58 (H) 07/21/2019 1025   ALKPHOS 102 09/08/2019 1222   BILITOT 0.4 09/08/2019 1222   BILITOT 0.3 07/21/2019 1025   GFRNONAA >60 09/08/2019 1222   GFRNONAA >60 07/21/2019  1025   GFRAA >60 09/08/2019 1222   GFRAA >60 07/21/2019 1025    No results found for: TOTALPROTELP, ALBUMINELP, A1GS, A2GS, BETS, BETA2SER, GAMS, MSPIKE, SPEI  No results found for: KPAFRELGTCHN, LAMBDASER, KAPLAMBRATIO  Lab Results  Component Value Date   WBC 2.7 (L) 09/08/2019   NEUTROABS 1.6 (L) 09/08/2019   HGB 9.1 (L) 09/08/2019   HCT 26.5 (L) 09/08/2019   MCV 97.8 09/08/2019   PLT 317 09/08/2019    No results found for: LABCA2  No components found for: UMPNTI144  No results for input(s): INR in the last 168 hours.  No results found for: LABCA2  No results found for: RXV400  No results found for: QQP619  No results found for: JKD326  No results found for: CA2729  No components found for: HGQUANT  No results found for: CEA1 / No results found for: CEA1   No results found for: AFPTUMOR  No results found for: CHROMOGRNA  No results found  for: HGBA, HGBA2QUANT, HGBFQUANT, HGBSQUAN (Hemoglobinopathy evaluation)   No results found for: LDH  No results found for: IRON, TIBC, IRONPCTSAT (Iron and TIBC)  No results found for: FERRITIN  Urinalysis No results found for: COLORURINE, APPEARANCEUR, LABSPEC, PHURINE, GLUCOSEU, HGBUR, BILIRUBINUR, KETONESUR, PROTEINUR, UROBILINOGEN, NITRITE, LEUKOCYTESUR   STUDIES: No results found.   ELIGIBLE FOR AVAILABLE RESEARCH PROTOCOL:no  ASSESSMENT: 29 y.o. BRCA1 positive Brianna Owens woman status post right breast overlapping sites biopsy 05/04/2019 for a clinical T3 N0, stage IIIB invasive ductal carcinoma, grade 2, triple negative, with an MIB-1 of 40%.  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  The Common Hereditary Gene Panel offered by [facility] includes sequencing and/or deletion duplication testing of the following 48 genes: APC, ATM, AXIN2, BARD1, BMPR1A, BRCA1, BRCA2, BRIP1, CDH1, CDK4, CDKN2A (p14ARF), CDKN2A (p16INK4a), CHEK2, CTNNA1, DICER1, EPCAM (Deletion/duplication testing only), GREM1 (promoter region deletion/duplication testing only), KIT, MEN1, MLH1, MSH2, MSH3, MSH6, MUTYH, NBN, NF1, NHTL1, PALB2, PDGFRA, PMS2, POLD1, POLE, PTEN, RAD50, RAD51C, RAD51D, RNF43, SDHB, SDHC, SDHD, SMAD4, SMARCA4. STK11, TP53, TSC1, TSC2, and VHL.  The following genes were evaluated for sequence changes only: SDHA and HOXB13 c.251G>A variant only. The report date is 05/24/2019.  (2) neoadjuvant chemotherapy consisting of doxorubicin and cyclophosphamide in dose dense fashion x4 started 05/26/2019, completed 07/07/2019, followed by paclitaxel and carboplatin weekly x12 started 07/21/2019  (3) definitive surgery to follow  (4) adjuvant radiation to follow  (5) consider bilateral salpingo-oophorectomy at the  end of breast cancer treatment  (a) receiving Lupron every 28 days   PLAN: Brianna Owens continues on weekly Paclitaxel and Carboplatin and is tolerating this well.  She has no peripheral neuroathy and can no longer feel her breast mass.  This is good.  Her liver enzymes have been elevated and her treatment has been dose reduced.  Should they remain increased, I will order an ultrasound of her RUQ.  (all of her labs are pending at appointment time)  We will see her back weekly for labs, f/u and her weekly chemotherapy.  She was recommended to continue with the appropriate pandemic precautions. She knows to call for any questions that may arise between now and her next appointment.  We are happy to see her sooner if needed.    Total encounter time: 20 minutes*  Wilber Bihari, NP 09/15/19 12:59 PM Medical Oncology and Hematology [facility]  [address] [place], [place] [address] Tel. [phone]    Fax. [phone]  *Total Encounter Time as defined by the Centers for Medicare and Medicaid Services includes, in addition to the face-to-face time of a patient visit (documented in the note above) non-face-to-face time: obtaining and reviewing outside history, ordering and reviewing medications, tests or procedures, care coordination (communications with other health care professionals or caregivers) and documentation in the medical record.

## 2019-09-15 NOTE — Patient Instructions (Signed)

## 2019-09-15 NOTE — Progress Notes (Signed)

## 2019-09-15 NOTE — Progress Notes (Signed)
No treatment today per Wilber Bihari, NP

## 2019-09-16 ENCOUNTER — Telehealth: Payer: Self-pay | Admitting: Adult Health

## 2019-09-16 NOTE — Telephone Encounter (Signed)
No 4/1 los. No changes made to pt's schedule.

## 2019-09-21 NOTE — Progress Notes (Signed)
[place]  Telephone:(336) [phone] Fax:(336) [phone]     ID: Brianna Owens DOB: 1990/09/09  MR#: [id number]  LFY#:[id number]  Patient Care Team: Patient, [name] as PCP - General (General Practice) Magrinat, [name], MD as Consulting Physician (Oncology) Brianna Luna, MD as Consulting Physician (General Surgery) Brianna Kaufmann, RN as Oncology Nurse Navigator Brianna Germany, RN as Oncology Nurse Navigator Brianna Cruel, MD OTHER MD:  CHIEF COMPLAINT: Triple negative breast cancer, BRCA1 positive  CURRENT TREATMENT: neoadjuvant chemotherapy   INTERVAL HISTORY: Brianna Owens returns today for follow up and treatment of her triple negative breast cancer.  She is currently receiving paclitaxel and carboplatin, and we have had to hold some treatments because of liver irritation.  Last week we did not give her treatment for that reason and we also dropped her dose with today's treatment.  On the plus side she has an excellent appetite no taste perversion no nausea or vomiting and no abdominal discomfort.  She has had no peripheral neuropathy  REVIEW OF SYSTEMS: Brianna Owens works 1 or 2 days a week.  Otherwise she enjoys taking walks.  She has not yet had any of the coronavirus vaccines.  Generally she "feels good".  A detailed review of systems today was otherwise stable   HISTORY OF CURRENT ILLNESS: From the original intake note:  Brianna Owens presented to the [facility] with a 3 month history of a right breast lump that became painful in early 04/2019. Physical exam performed at that time showed a palpable, tender 13 cm lump within the right center breast under the nipple area. She underwent right breast ultrasonography at [facility] on 04/27/2019 showing: 6.1 cm palpable mass centered in the 5 o'clock position of the right breast; single right inferomedial axillary lymph node with focal cortical  thickening inferiorly.  Accordingly on 05/04/2019 she proceeded to biopsy of the right breast area in question. The pathology from this procedure ([id number]) showed: invasive ductal carcinoma, grade 2-3. Prognostic indicators significant for: estrogen receptor, 0% negative and progesterone receptor, 0% negative. Proliferation marker Ki67 at 40%.  I do not find HER-2 receptor documentation  The right axillary lymph node biopsied at that time showed reactive germinal centers.  She underwent bilateral diagnostic mammography with tomography at [facility] on 05/05/2019 showing: breast density category D; biopsy-proven right breast malignancy measures 6.1 cm; no additional masses or calcifications seen in the right breast; no evidence of malignancy in the left breast.  The patient's subsequent history is as detailed below.   PAST MEDICAL HISTORY: No past medical history on file.  PAST SURGICAL HISTORY: Past Surgical History:  Procedure Laterality Date  . IR IMAGING GUIDED PORT INSERTION  05/24/2019    FAMILY HISTORY: Family History  Problem Relation Age of Onset  . Breast cancer Mother   . Breast cancer Maternal Aunt    Patient's father is 27 and her mother 27 as of November 2020.  The patient's mother was diagnosed with breast cancer in her early 83s.  She lives in [place]. The patient's mother's sister was also diagnosed with breast cancer in her early 35s.  The patient herself has 1 sister, no brothers.  She is not aware of any ovarian cancer cases in the family.   GYNECOLOGIC HISTORY:  No LMP recorded. Menarche: 29 years old Age at first live birth: 29 years old [place] P 2 LMPregular Contraceptive HRT n/a  Hysterectomy? no BSO? no  SOCIAL HISTORY: (updated 04/2019)  Brianna Owens is currently working at [employer].  She is originally from [place].  She is divorced.  Her children are Brianna Owens, 51 years old, living in [place] with his father, and Brianna Owens, 26 years old, who lives with  the patient.  Also at home are her uncle Brianna Owens, his wife, and that wife's cousin.     ADVANCED DIRECTIVES: Not in place   HEALTH MAINTENANCE: Social History   Tobacco Use  . Smoking status: Never Smoker  . Smokeless tobacco: Never Used  Substance Use Topics  . Alcohol use: Not Currently  . Drug use: Not Currently     Colonoscopy: n/a  PAP: 04/26/2019, negative  Bone density: n/a   No Known Allergies  Current Outpatient Medications  Medication Sig Dispense Refill  . lidocaine-prilocaine (EMLA) cream Apply to affected area once 30 g 3  . loratadine (CLARITIN) 10 MG tablet Take 1 tablet (10 mg total) by mouth daily. 60 tablet 0  . prochlorperazine (COMPAZINE) 10 MG tablet Take 1 tablet (10 mg total) by mouth every 6 (six) hours as needed (Nausea or vomiting). 30 tablet 1  . venlafaxine XR (EFFEXOR-XR) 75 MG 24 hr capsule Take 1 capsule (75 mg total) by mouth daily with breakfast. 30 capsule 4   No current facility-administered medications for this visit.    OBJECTIVE:  Spanish speaker who appears stated age  29:   09/22/19 1320  BP: 112/69  Pulse: 82  Resp: 18  Temp: 98.9 F (37.2 C)  SpO2: 100%     Body mass index is 24.69 kg/m.   Wt Readings from Last 3 Encounters:  09/22/19 162 lb 6.4 oz (73.7 kg)  09/15/19 164 lb 1.6 oz (74.4 kg)  09/01/19 159 lb 3.2 oz (72.2 kg)      ECOG FS:1 - Symptomatic but completely ambulatory  Sclerae unicteric, EOMs intact Wearing a mask No cervical or supraclavicular adenopathy Lungs no rales or rhonchi Heart regular rate and rhythm Abd soft, nontender, positive bowel sounds MSK no focal spinal tenderness, no upper extremity lymphedema Neuro: nonfocal, well oriented, appropriate affect Breasts: I do not palpate a mass in the right breast.  The left breast is benign.  Both axillae are benign.   LAB RESULTS:  CMP     Component Value Date/Time   NA 139 09/15/2019 1258   K 3.7 09/15/2019 1258   CL 107  09/15/2019 1258   CO2 22 09/15/2019 1258   GLUCOSE 130 (H) 09/15/2019 1258   BUN 8 09/15/2019 1258   CREATININE 0.60 09/15/2019 1258   CREATININE 0.45 07/21/2019 1025   CALCIUM 9.2 09/15/2019 1258   PROT 7.4 09/15/2019 1258   ALBUMIN 4.0 09/15/2019 1258   AST 82 (H) 09/15/2019 1258   AST 29 07/21/2019 1025   ALT 180 (H) 09/15/2019 1258   ALT 58 (H) 07/21/2019 1025   ALKPHOS 115 09/15/2019 1258   BILITOT 0.3 09/15/2019 1258   BILITOT 0.3 07/21/2019 1025   GFRNONAA >60 09/15/2019 1258   GFRNONAA >60 07/21/2019 1025   GFRAA >60 09/15/2019 1258   GFRAA >60 07/21/2019 1025    No results found for: TOTALPROTELP, ALBUMINELP, A1GS, A2GS, BETS, BETA2SER, GAMS, MSPIKE, SPEI  No results found for: KPAFRELGTCHN, LAMBDASER, KAPLAMBRATIO  Lab Results  Component Value Date   WBC 4.1 09/22/2019   NEUTROABS 2.2 09/22/2019   HGB 9.8 (L) 09/22/2019   HCT 28.9 (L) 09/22/2019   MCV 98.0 09/22/2019   PLT 183 09/22/2019  No results found for: LABCA2  No components found for: EPPIRJ188  No results for input(s): INR in the last 168 hours.  No results found for: LABCA2  No results found for: CZY606  No results found for: TKZ601  No results found for: UXN235  No results found for: CA2729  No components found for: HGQUANT  No results found for: CEA1 / No results found for: CEA1   No results found for: AFPTUMOR  No results found for: CHROMOGRNA  No results found for: HGBA, HGBA2QUANT, HGBFQUANT, HGBSQUAN (Hemoglobinopathy evaluation)   No results found for: LDH  No results found for: IRON, TIBC, IRONPCTSAT (Iron and TIBC)  No results found for: FERRITIN  Urinalysis No results found for: COLORURINE, APPEARANCEUR, LABSPEC, PHURINE, GLUCOSEU, HGBUR, BILIRUBINUR, KETONESUR, PROTEINUR, UROBILINOGEN, NITRITE, LEUKOCYTESUR   STUDIES: No results found.   ELIGIBLE FOR AVAILABLE RESEARCH PROTOCOL:no  ASSESSMENT: 29 y.o. BRCA1 positive Brianna Owens woman status post right  breast overlapping sites biopsy 05/04/2019 for a clinical T3 N0, stage IIIB invasive ductal carcinoma, grade 2, triple negative, with an MIB-1 of 40%.  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  The Common Hereditary Gene Panel offered by [facility] includes sequencing and/or deletion duplication testing of the following 48 genes: APC, ATM, AXIN2, BARD1, BMPR1A, BRCA1, BRCA2, BRIP1, CDH1, CDK4, CDKN2A (p14ARF), CDKN2A (p16INK4a), CHEK2, CTNNA1, DICER1, EPCAM (Deletion/duplication testing only), GREM1 (promoter region deletion/duplication testing only), KIT, MEN1, MLH1, MSH2, MSH3, MSH6, MUTYH, NBN, NF1, NHTL1, PALB2, PDGFRA, PMS2, POLD1, POLE, PTEN, RAD50, RAD51C, RAD51D, RNF43, SDHB, SDHC, SDHD, SMAD4, SMARCA4. STK11, TP53, TSC1, TSC2, and VHL.  The following genes were evaluated for sequence changes only: SDHA and HOXB13 c.251G>A variant only. The report date is 05/24/2019.  (2) neoadjuvant chemotherapy consisting of doxorubicin and cyclophosphamide in dose dense fashion x4 started 05/26/2019, completed 07/07/2019, followed by paclitaxel and carboplatin weekly x12 started 07/21/2019  (3) definitive surgery to follow  (4) adjuvant radiation to follow  (5) consider bilateral salpingo-oophorectomy at the end of breast cancer treatment  (a) receiving Lupron every 28 days   PLAN: Brianna Owens is tolerating her treatments remarkably well.  The flying the ointment is to deliver irritation.  As stated above we have dropped the dose and hopefully this will allow [place] to complete her treatments.  Today's #8 of 12.  At this point it would be useful to repeat a breast MRI.  If she is in complete radiologic response or near it we need to start preparing for surgery.  Given that she is BRCA positive it may be prudent to proceed to  bilateral mastectomies in this young woman's case.  The question is whether she will be able to have reconstruction.  I will refer her back to her surgeon for further discussion  Otherwise she will return to see [place] in 1 week.  She knows to call for any other issue that may develop before then.  Total encounter time 25 minutes.Brianna Owens C. Magrinat, MD 09/22/19 1:45 PM Medical Oncology and Hematology [facility] [place], [place] [address] Tel. [phone]    Fax. [phone]   I, Wilburn Mylar, am acting as scribe for Dr. Virgie Owens. Magrinat.  I, Lurline Del MD, have reviewed the above documentation for accuracy and completeness, and I agree with the above.    *Total Encounter Time as  defined by the Centers for Medicare and Medicaid Services includes, in addition to the face-to-face time of a patient visit (documented in the note above) non-face-to-face time: obtaining and reviewing outside history, ordering and reviewing medications, tests or procedures, care coordination (communications with other health care professionals or caregivers) and documentation in the medical record.

## 2019-09-22 ENCOUNTER — Other Ambulatory Visit: Payer: Self-pay

## 2019-09-22 ENCOUNTER — Inpatient Hospital Stay: Payer: No Typology Code available for payment source

## 2019-09-22 ENCOUNTER — Encounter: Payer: Self-pay | Admitting: *Deleted

## 2019-09-22 ENCOUNTER — Inpatient Hospital Stay (HOSPITAL_BASED_OUTPATIENT_CLINIC_OR_DEPARTMENT_OTHER): Payer: Self-pay | Admitting: Oncology

## 2019-09-22 VITALS — BP 112/69 | HR 82 | Temp 98.9°F | Resp 18 | Ht 68.0 in | Wt 162.4 lb

## 2019-09-22 DIAGNOSIS — Z171 Estrogen receptor negative status [ER-]: Secondary | ICD-10-CM

## 2019-09-22 DIAGNOSIS — C50811 Malignant neoplasm of overlapping sites of right female breast: Secondary | ICD-10-CM

## 2019-09-22 DIAGNOSIS — Z95828 Presence of other vascular implants and grafts: Secondary | ICD-10-CM

## 2019-09-22 DIAGNOSIS — K760 Fatty (change of) liver, not elsewhere classified: Secondary | ICD-10-CM

## 2019-09-22 DIAGNOSIS — Z1502 Genetic susceptibility to malignant neoplasm of ovary: Secondary | ICD-10-CM

## 2019-09-22 DIAGNOSIS — Z1501 Genetic susceptibility to malignant neoplasm of breast: Secondary | ICD-10-CM

## 2019-09-22 LAB — CBC WITH DIFFERENTIAL/PLATELET
Abs Immature Granulocytes: 0.01 10*3/uL (ref 0.00–0.07)
Basophils Absolute: 0 10*3/uL (ref 0.0–0.1)
Basophils Relative: 1 %
Eosinophils Absolute: 0 10*3/uL (ref 0.0–0.5)
Eosinophils Relative: 1 %
HCT: 28.9 % — ABNORMAL LOW (ref 36.0–46.0)
Hemoglobin: 9.8 g/dL — ABNORMAL LOW (ref 12.0–15.0)
Immature Granulocytes: 0 %
Lymphocytes Relative: 30 %
Lymphs Abs: 1.2 10*3/uL (ref 0.7–4.0)
MCH: 33.2 pg (ref 26.0–34.0)
MCHC: 33.9 g/dL (ref 30.0–36.0)
MCV: 98 fL (ref 80.0–100.0)
Monocytes Absolute: 0.6 10*3/uL (ref 0.1–1.0)
Monocytes Relative: 15 %
Neutro Abs: 2.2 10*3/uL (ref 1.7–7.7)
Neutrophils Relative %: 53 %
Platelets: 183 10*3/uL (ref 150–400)
RBC: 2.95 MIL/uL — ABNORMAL LOW (ref 3.87–5.11)
RDW: 14.9 % (ref 11.5–15.5)
WBC: 4.1 10*3/uL (ref 4.0–10.5)
nRBC: 0 % (ref 0.0–0.2)

## 2019-09-22 LAB — COMPREHENSIVE METABOLIC PANEL
ALT: 172 U/L — ABNORMAL HIGH (ref 0–44)
AST: 74 U/L — ABNORMAL HIGH (ref 15–41)
Albumin: 4 g/dL (ref 3.5–5.0)
Alkaline Phosphatase: 97 U/L (ref 38–126)
Anion gap: 6 (ref 5–15)
BUN: 13 mg/dL (ref 6–20)
CO2: 26 mmol/L (ref 22–32)
Calcium: 9.3 mg/dL (ref 8.9–10.3)
Chloride: 107 mmol/L (ref 98–111)
Creatinine, Ser: 0.6 mg/dL (ref 0.44–1.00)
GFR calc Af Amer: >60 mL/min (ref >60)
GFR calc non Af Amer: >60 mL/min (ref >60)
Glucose, Bld: 111 mg/dL — ABNORMAL HIGH (ref 70–99)
Potassium: 3.8 mmol/L (ref 3.5–5.1)
Sodium: 139 mmol/L (ref 135–145)
Total Bilirubin: 0.3 mg/dL (ref 0.3–1.2)
Total Protein: 7.6 g/dL (ref 6.5–8.1)

## 2019-09-22 LAB — PREGNANCY, URINE: Preg Test, Ur: NEGATIVE

## 2019-09-22 MED ORDER — SODIUM CHLORIDE 0.9% FLUSH
10.0000 mL | INTRAVENOUS | Status: DC | PRN
  Administered 2019-09-22: 10 mL via INTRAVENOUS
  Filled 2019-09-22: qty 10

## 2019-09-22 MED ORDER — SODIUM CHLORIDE 0.9 % IV SOLN
65.0000 mg/m2 | Freq: Once | INTRAVENOUS | Status: AC
  Administered 2019-09-22: 120 mg via INTRAVENOUS
  Filled 2019-09-22: qty 20

## 2019-09-22 MED ORDER — PALONOSETRON HCL INJECTION 0.25 MG/5ML
INTRAVENOUS | Status: AC
  Filled 2019-09-22: qty 5

## 2019-09-22 MED ORDER — FAMOTIDINE IN NACL 20-0.9 MG/50ML-% IV SOLN
INTRAVENOUS | Status: AC
  Filled 2019-09-22: qty 50

## 2019-09-22 MED ORDER — SODIUM CHLORIDE 0.9 % IV SOLN
Freq: Once | INTRAVENOUS | Status: AC
  Filled 2019-09-22: qty 250

## 2019-09-22 MED ORDER — SODIUM CHLORIDE 0.9 % IV SOLN
10.0000 mg | Freq: Once | INTRAVENOUS | Status: AC
  Administered 2019-09-22: 10 mg via INTRAVENOUS
  Filled 2019-09-22: qty 10

## 2019-09-22 MED ORDER — FAMOTIDINE IN NACL 20-0.9 MG/50ML-% IV SOLN
20.0000 mg | Freq: Once | INTRAVENOUS | Status: AC
  Administered 2019-09-22: 20 mg via INTRAVENOUS

## 2019-09-22 MED ORDER — HEPARIN SOD (PORK) LOCK FLUSH 100 UNIT/ML IV SOLN
500.0000 [IU] | Freq: Once | INTRAVENOUS | Status: AC | PRN
  Administered 2019-09-22: 500 [IU]
  Filled 2019-09-22: qty 5

## 2019-09-22 MED ORDER — PALONOSETRON HCL INJECTION 0.25 MG/5ML
0.2500 mg | Freq: Once | INTRAVENOUS | Status: AC
  Administered 2019-09-22: 0.25 mg via INTRAVENOUS

## 2019-09-22 MED ORDER — SODIUM CHLORIDE 0.9 % IV SOLN
287.8000 mg | Freq: Once | INTRAVENOUS | Status: AC
  Administered 2019-09-22: 290 mg via INTRAVENOUS
  Filled 2019-09-22: qty 29

## 2019-09-22 MED ORDER — DEXAMETHASONE SODIUM PHOSPHATE 10 MG/ML IJ SOLN: 10.0000 mg | Freq: Once | INTRAMUSCULAR | Status: DC

## 2019-09-22 MED ORDER — DIPHENHYDRAMINE HCL 50 MG/ML IJ SOLN
INTRAMUSCULAR | Status: AC
  Filled 2019-09-22: qty 1

## 2019-09-22 MED ORDER — SODIUM CHLORIDE 0.9% FLUSH
10.0000 mL | INTRAVENOUS | Status: DC | PRN
  Administered 2019-09-22: 10 mL
  Filled 2019-09-22: qty 10

## 2019-09-22 MED ORDER — DIPHENHYDRAMINE HCL 50 MG/ML IJ SOLN
25.0000 mg | Freq: Once | INTRAMUSCULAR | Status: AC
  Administered 2019-09-22: 25 mg via INTRAVENOUS

## 2019-09-22 NOTE — Patient Instructions (Signed)
[facility] °Discharge Instructions for Patients Receiving Chemotherapy ° °Today you received the following chemotherapy agents Taxol; Carboplatin ° °To help prevent nausea and vomiting after your treatment, we encourage you to take your nausea medication as directed °  °If you develop nausea and vomiting that is not controlled by your nausea medication, call the clinic.  ° °BELOW ARE SYMPTOMS THAT SHOULD BE REPORTED IMMEDIATELY: °· *FEVER GREATER THAN 100.5 F °· *CHILLS WITH OR WITHOUT FEVER °· NAUSEA AND VOMITING THAT IS NOT CONTROLLED WITH YOUR NAUSEA MEDICATION °· *UNUSUAL SHORTNESS OF BREATH °· *UNUSUAL BRUISING OR BLEEDING °· TENDERNESS IN MOUTH AND THROAT WITH OR WITHOUT PRESENCE OF ULCERS °· *URINARY PROBLEMS °· *BOWEL PROBLEMS °· UNUSUAL RASH °Items with * indicate a potential emergency and should be followed up as soon as possible. ° °Feel free to call the clinic should you have any questions or concerns. The clinic phone number is [phone]. ° °Please show the CHEMO ALERT CARD at check-in to the Emergency Department and triage nurse. ° ° °

## 2019-09-22 NOTE — Patient Instructions (Signed)

## 2019-09-22 NOTE — Progress Notes (Signed)
Per Dr. Jana Hakim, okay to treat with ALT 172.

## 2019-09-23 ENCOUNTER — Telehealth: Payer: Self-pay | Admitting: Oncology

## 2019-09-23 ENCOUNTER — Encounter: Payer: Self-pay | Admitting: *Deleted

## 2019-09-23 NOTE — Progress Notes (Signed)
Pharmacist Chemotherapy Monitoring - Follow Up Assessment    I verify that I have reviewed each item in the below checklist:  . Regimen for the patient is scheduled for the appropriate day and plan matches scheduled date. Marland Kitchen Appropriate non-routine labs are ordered dependent on drug ordered. . If applicable, additional medications reviewed and ordered per protocol based on lifetime cumulative doses and/or treatment regimen.   Plan for follow-up and/or issues identified: Yes . I-vent associated with next due treatment: No . MD and/or nursing notified: No  Adelina Mings 09/23/2019 11:15 AM

## 2019-09-23 NOTE — Telephone Encounter (Signed)
No 4/8 los. No changes made to pt's schedule.

## 2019-09-26 ENCOUNTER — Ambulatory Visit
Admission: RE | Admit: 2019-09-26 | Discharge: 2019-09-26 | Disposition: A | Discharge status: Home / Self Care | Payer: No Typology Code available for payment source | Source: Ambulatory Visit | Attending: Oncology | Admitting: Oncology

## 2019-09-26 ENCOUNTER — Other Ambulatory Visit: Payer: Self-pay

## 2019-09-26 DIAGNOSIS — Z171 Estrogen receptor negative status [ER-]: Secondary | ICD-10-CM

## 2019-09-26 DIAGNOSIS — K760 Fatty (change of) liver, not elsewhere classified: Secondary | ICD-10-CM

## 2019-09-26 DIAGNOSIS — Z1501 Genetic susceptibility to malignant neoplasm of breast: Secondary | ICD-10-CM

## 2019-09-26 DIAGNOSIS — C50811 Malignant neoplasm of overlapping sites of right female breast: Secondary | ICD-10-CM

## 2019-09-26 IMAGING — MR MR BREAST BILAT WO/W CM
8 of 12 series · 29 of 48 positions shown · IV contrast (of gadavist)
Comparison: Previous exam(s).

CLINICAL DATA: 28-year-old female with known right breast cancer
presenting for re-evaluation after neoadjuvant chemotherapy.

LABS:  None performed on site.
EXAM:
BILATERAL BREAST MRI WITH AND WITHOUT CONTRAST
TECHNIQUE: Multiplanar, multisequence MR images of both breasts were obtained
prior to and following the intravenous administration of 10 ml of
Gadavist.

[Series 2: t2_tirm_tra ipat (a-p) · axial · 3.0mm · 0.70mm/px · 1 of 63 slices shown]
[im 1/63]
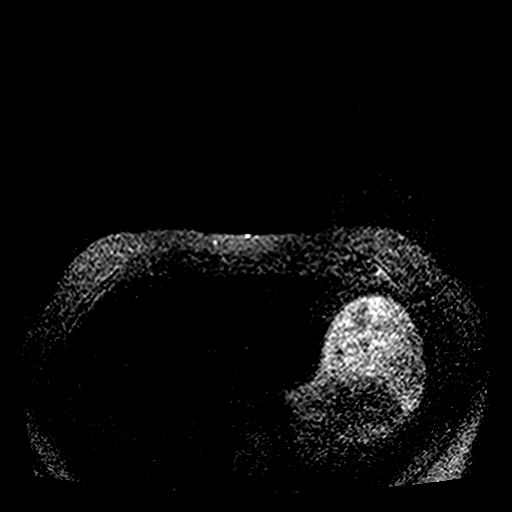

[Series 3: fl3d pre-cm no · axial · non-contrast · 0.9mm · 0.94mm/px · z∈[-102,+84]mm · 5 of 208 slices shown]
[im 1/208]
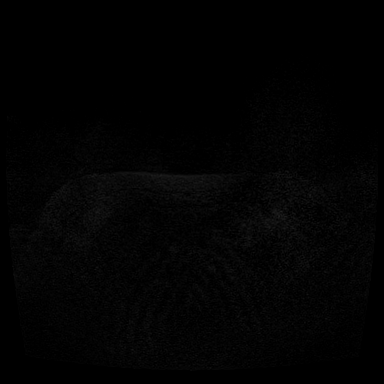
[im 52/208]
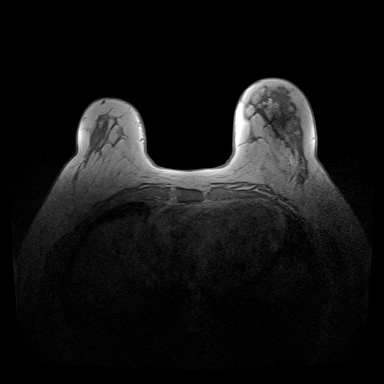
[im 104/208]
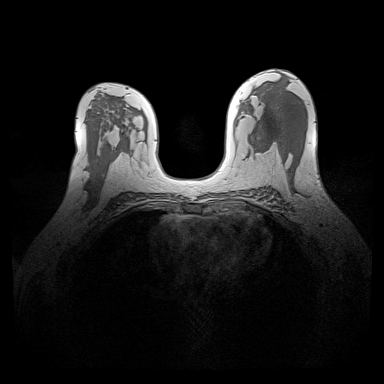
[im 156/208]
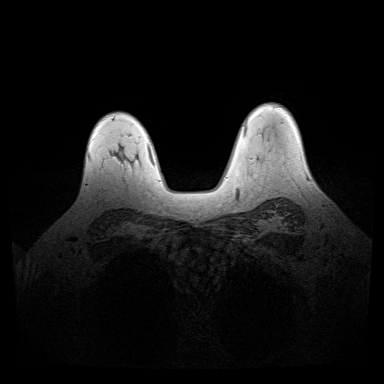
[im 208/208]
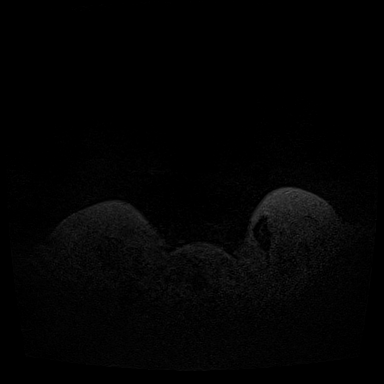

[Series 4: fl3d pre-cm · axial · non-contrast · 0.9mm · 0.87mm/px · z∈[-102,+84]mm · 5 of 208 slices shown]
[im 1/208]
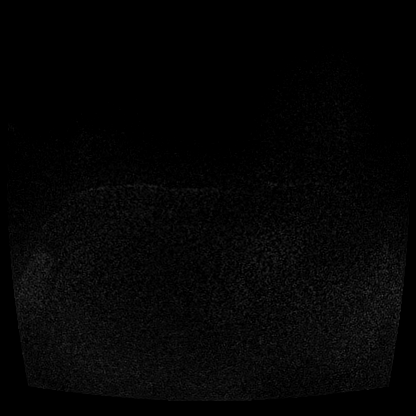
[im 52/208]
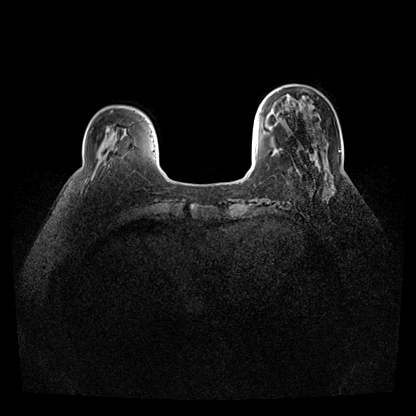
[im 104/208]
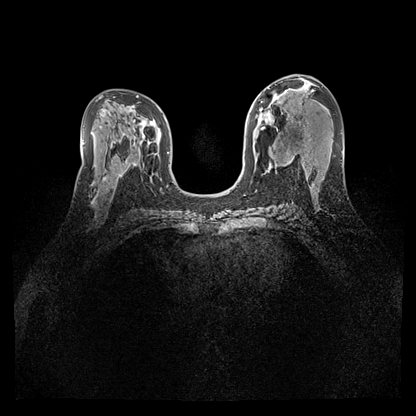
[im 156/208]
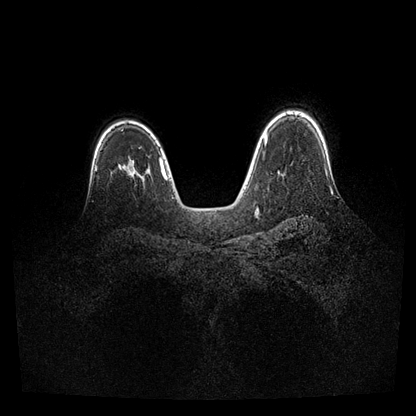
[im 208/208]
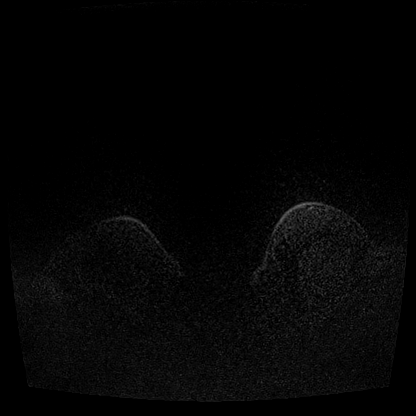

[Series 5: fl3d post-cm 20 · axial · 0.9mm · 0.87mm/px · z∈[-102,+84]mm · 5 of 208 slices shown (1 of 3)]
[im 1/208]
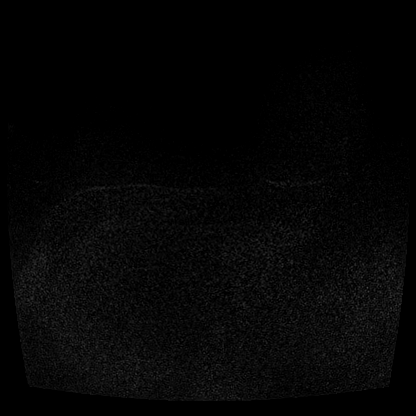
[im 52/208]
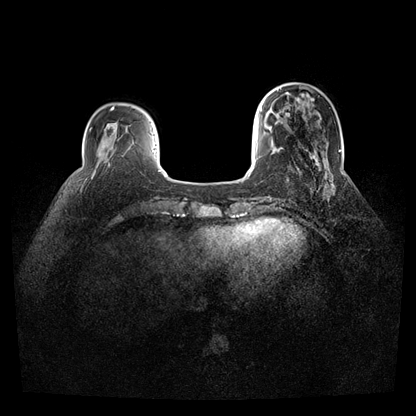
[im 104/208]
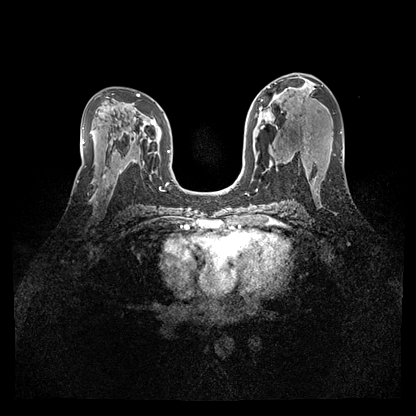
[im 156/208]
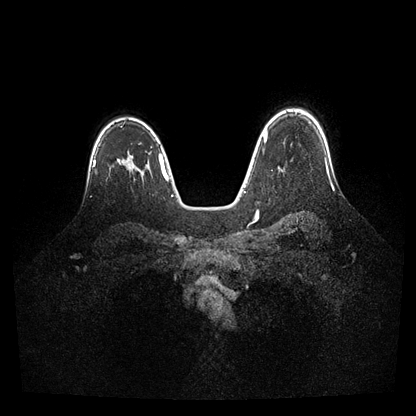
[im 208/208]
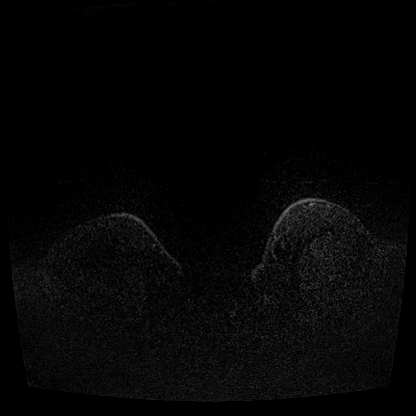

[Series 6: fl3d post-cm 20 · axial · 0.9mm · 0.87mm/px · z∈[-102,+84]mm · 5 of 208 slices shown (2 of 3)]
[im 1/208]
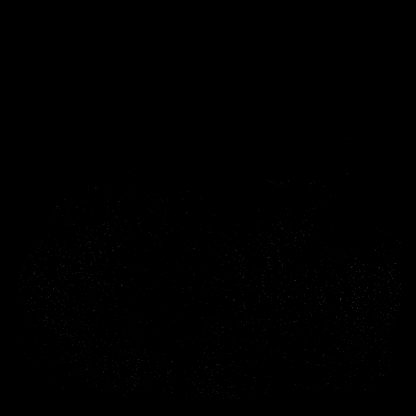
[im 52/208]
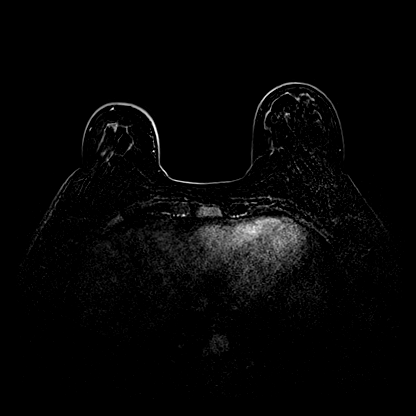
[im 104/208]
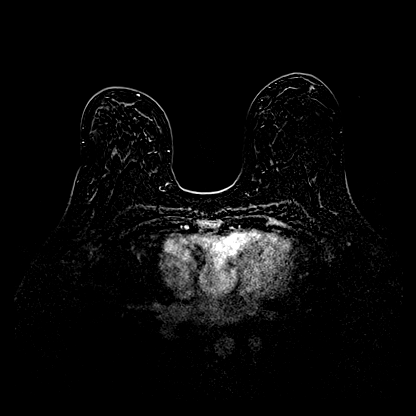
[im 156/208]
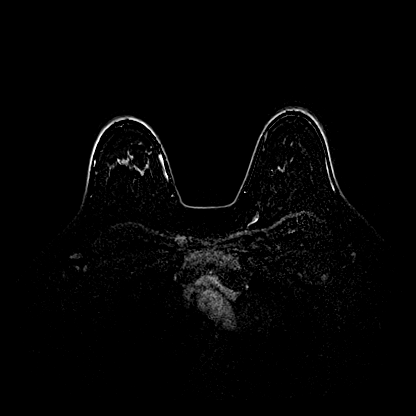
[im 208/208]
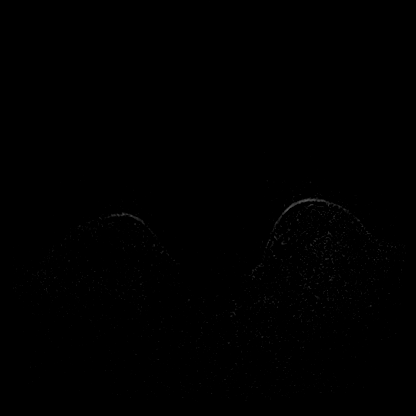

[Series 7: fl3d post-cm 20 · axial · 187.2mm · 0.87mm/px · 1 of 1 slices shown (3 of 3)]
[im 1/1]
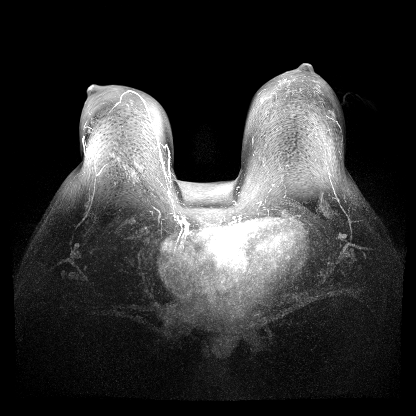

[Series 8: fl3d post-cm 3min · axial · 0.9mm · 0.87mm/px · z∈[-102,+84]mm · 6 of 208 slices shown]
[im 1/208]
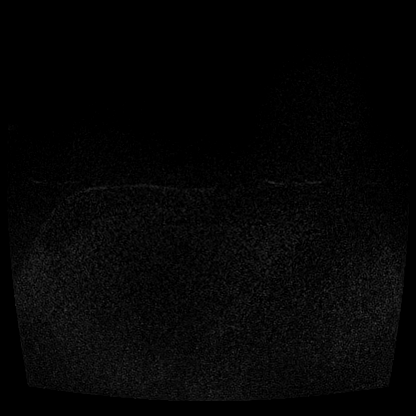
[im 42/208]
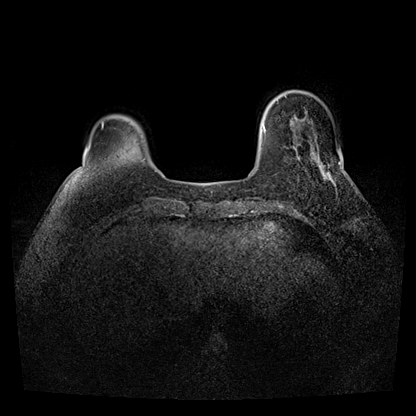
[im 83/208]
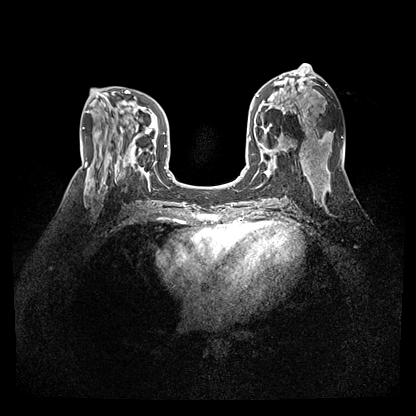
[im 125/208]
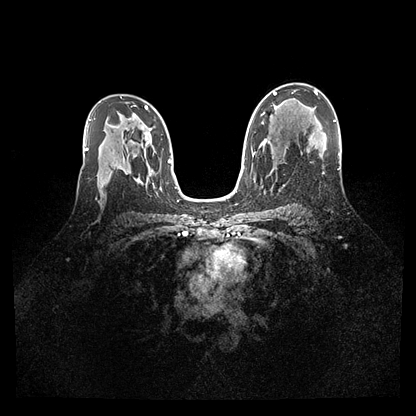
[im 166/208]
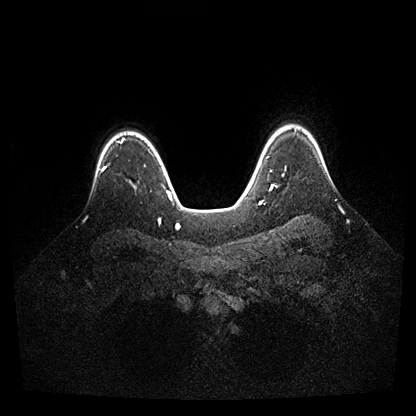
[im 208/208]
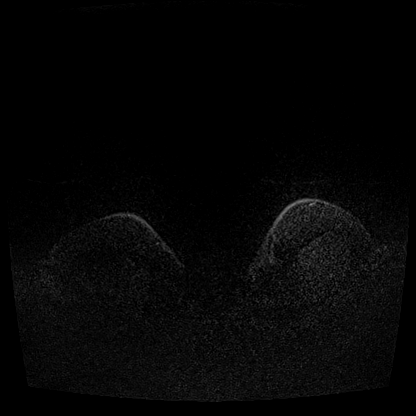

[Series 9: fl3d post-cm 3min_sub · axial · 0.9mm · 0.87mm/px · 1 of 208 slices shown]
[im 1/208]
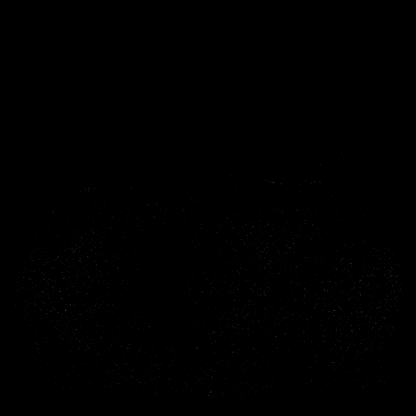

[29 of 48 positions shown; findings below may reference images not displayed]

Three-dimensional MR images were rendered by post-processing of the
original MR data on an independent workstation. The
three-dimensional MR images were interpreted, and findings are
reported in the following complete MRI report for this study. Three
dimensional images were evaluated at the independent DynaCad
workstation
FINDINGS: Breast composition: d. Extreme fibroglandular tissue.

Background parenchymal enhancement: Mild.

Right breast: Previously described large, enhancing mass in the
central right breast has markedly decreased in size. Approximately 2
cm of residual architectural distortion remain, though no
significant enhancement is seen in association with it today. No
additional suspicious findings in the remainder of the right breast.

Left breast: No suspicious mass or abnormal enhancement.

Lymph nodes: There is been interval resolution of marked right
axillary lymphadenopathy as previously described. Previously
described enlarged right internal mammary chain lymph nodes have
also resolved. No suspicious lymphadenopathy identified today.

Ancillary findings:  None.
IMPRESSION: Marked interval improvement/resolution in right breast mass and
lymphadenopathy consistent with excellent treatment response.

RECOMMENDATION:
Per clinical treatment plan.

BI-RADS CATEGORY  6: Known biopsy-proven malignancy.

## 2019-09-26 MED ORDER — GADOBUTROL 1 MMOL/ML IV SOLN
10.0000 mL | Freq: Once | INTRAVENOUS | Status: AC | PRN
  Administered 2019-09-26: 10 mL via INTRAVENOUS

## 2019-09-27 ENCOUNTER — Encounter: Payer: Self-pay | Admitting: *Deleted

## 2019-09-28 NOTE — Progress Notes (Signed)
[facility]  [phone] [phone]     ID: Brianna Owens DOB: Jul 16, 1990  MR#: [id number]  XBM#:[id number]  Patient Care Team: Patient, [name] as PCP - General (General Practice) Jessi Jessop, [name], MD as Consulting Physician (Oncology) Erroll Luna, MD as Consulting Physician (General Surgery) [name], RN as Oncology Nurse Navigator [name], RN as Oncology Nurse Navigator Chauncey Cruel, MD OTHER MD:  CHIEF COMPLAINT: Triple negative breast cancer, BRCA1 positive  CURRENT TREATMENT: neoadjuvant chemotherapy   INTERVAL HISTORY: Brianna Owens returns today for follow up and treatment of her triple negative breast cancer.    She is currently receiving paclitaxel and carboplatin. Today is the ninth of 12 planned weekly doses. We have had to hold doses here and there and drop the total dose because of liver irritation. This however should be transient.  I gave her a copy of the MRI results and I showed her the images, which show a complete radiologic resolution. This highly correlates with a complete pathologic resolution at the time of surgery. I also mentioned the possibility of bilateral mastectomies given her young age and BRCA1 positivity  REVIEW OF SYSTEMS: Brianna Owens is doing well, still working part-time, taking care of her nieces child at home. She has had no peripheral neuropathy. She has had no unusual headaches visual changes cough phlegm production pleurisy shortness of breath or change in bowel or bladder habits.   HISTORY OF CURRENT ILLNESS: From the original intake note:  Brianna Owens [facility] with a 3 month history of a right breast lump that became painful in early 04/2019. Physical exam performed at that time showed a palpable, tender 13 cm lump within the right center breast under the nipple area. She underwent right breast  ultrasonography at [facility] on 04/27/2019 showing: 6.1 cm palpable mass centered in the 5 o'clock position of the right breast; single right inferomedial axillary lymph node with focal cortical thickening inferiorly.  Accordingly on 05/04/2019 she proceeded to biopsy of the right breast area in question. The pathology from this procedure ([id number]) showed: invasive ductal carcinoma, grade 2-3. Prognostic indicators significant for: estrogen receptor, 0% negative and progesterone receptor, 0% negative. Proliferation marker Ki67 at 40%.  I do not find HER-2 receptor documentation  The right axillary lymph node biopsied at that time showed reactive germinal centers.  She underwent bilateral diagnostic mammography with tomography at [facility] on 05/05/2019 showing: breast density category D; biopsy-proven right breast malignancy measures 6.1 cm; no additional masses or calcifications seen in the right breast; no evidence of malignancy in the left breast.  The patient's subsequent history is as detailed below.   PAST MEDICAL HISTORY: No past medical history on file.  PAST SURGICAL HISTORY: Past Surgical History:  Procedure Laterality Date  . IR IMAGING GUIDED PORT INSERTION  05/24/2019    FAMILY HISTORY: Family History  Problem Relation Age of Onset  . Breast cancer Mother   . Breast cancer Maternal Aunt    Patient's father is 81 and her mother 33 as of November 2020.  The patient's mother was diagnosed with breast cancer in her early 81s.  She lives in [place]. The patient's mother's sister was also diagnosed with breast cancer in her early 34s.  The patient herself has 1 sister, no brothers.  She is not aware of any ovarian cancer cases in the family.   GYNECOLOGIC HISTORY:  No LMP  recorded. Menarche: 29 years old Age at first live birth: 29 years old [place] P 2 LMPregular Contraceptive HRT n/a  Hysterectomy? no BSO? no   SOCIAL HISTORY: (updated 04/2019)    Brianna Owens is currently working at [employer].  She is originally from [place].  She is divorced.  Her children are [name], 54 years old, living in [place] with his father, and [name], 45 years old, who lives with the patient.  Also at home are her uncle [name], his wife, and that wife's cousin.     ADVANCED DIRECTIVES: Not in place   HEALTH MAINTENANCE: Social History   Tobacco Use  . Smoking status: Never Smoker  . Smokeless tobacco: Never Used  Substance Use Topics  . Alcohol use: Not Currently  . Drug use: Not Currently     Colonoscopy: n/a  PAP: 04/26/2019, negative  Bone density: n/a   No Known Allergies  Current Outpatient Medications  Medication Sig Dispense Refill  . lidocaine-prilocaine (EMLA) cream Apply to affected area once 30 g 3  . loratadine (CLARITIN) 10 MG tablet Take 1 tablet (10 mg total) by mouth daily. 60 tablet 0  . prochlorperazine (COMPAZINE) 10 MG tablet Take 1 tablet (10 mg total) by mouth every 6 (six) hours as needed (Nausea or vomiting). 30 tablet 1  . venlafaxine XR (EFFEXOR-XR) 75 MG 24 hr capsule Take 1 capsule (75 mg total) by mouth daily with breakfast. 30 capsule 4   No current facility-administered medications for this visit.    OBJECTIVE:  Spanish speaker in no acute distress  Vitals:   09/29/19 1248  BP: (!) 115/56  Pulse: 94  Resp: 18  Temp: 99.1 F (37.3 C)  SpO2: 100%     Body mass index is 24.74 kg/m.   Wt Readings from Last 3 Encounters:  09/29/19 162 lb 11.2 oz (73.8 kg)  09/22/19 162 lb 6.4 oz (73.7 kg)  09/15/19 164 lb 1.6 oz (74.4 kg)      ECOG FS:1 - Symptomatic but completely ambulatory  Sclerae unicteric, EOMs intact Wearing a mask No cervical or supraclavicular adenopathy Lungs no rales or rhonchi Heart regular rate and rhythm Abd soft, nontender, positive bowel sounds MSK no focal spinal tenderness, no upper extremity lymphedema Neuro: nonfocal, well oriented, appropriate affect Breasts: The right  breast shows no masses no skin or nipple changes of concern. Left breast is benign. Both axillae are benign.   LAB RESULTS:  CMP     Component Value Date/Time   NA 139 09/22/2019 1310   K 3.8 09/22/2019 1310   CL 107 09/22/2019 1310   CO2 26 09/22/2019 1310   GLUCOSE 111 (H) 09/22/2019 1310   BUN 13 09/22/2019 1310   CREATININE 0.60 09/22/2019 1310   CREATININE 0.45 07/21/2019 1025   CALCIUM 9.3 09/22/2019 1310   PROT 7.6 09/22/2019 1310   ALBUMIN 4.0 09/22/2019 1310   AST 74 (H) 09/22/2019 1310   AST 29 07/21/2019 1025   ALT 172 (H) 09/22/2019 1310   ALT 58 (H) 07/21/2019 1025   ALKPHOS 97 09/22/2019 1310   BILITOT 0.3 09/22/2019 1310   BILITOT 0.3 07/21/2019 1025   GFRNONAA >60 09/22/2019 1310   GFRNONAA >60 07/21/2019 1025   GFRAA >60 09/22/2019 1310   GFRAA >60 07/21/2019 1025    No results found for: TOTALPROTELP, ALBUMINELP, A1GS, A2GS, BETS, BETA2SER, GAMS, MSPIKE, SPEI  No results found for: KPAFRELGTCHN, LAMBDASER, KAPLAMBRATIO  Lab Results  Component Value Date   WBC 4.7 09/29/2019  NEUTROABS 2.7 09/29/2019   HGB 10.3 (L) 09/29/2019   HCT 29.7 (L) 09/29/2019   MCV 98.0 09/29/2019   PLT 174 09/29/2019    No results found for: LABCA2  No components found for: VPXTGG269  No results for input(s): INR in the last 168 hours.  No results found for: LABCA2  No results found for: SWN462  No results found for: VOJ500  No results found for: XFG182  No results found for: CA2729  No components found for: HGQUANT  No results found for: CEA1 / No results found for: CEA1   No results found for: AFPTUMOR  No results found for: CHROMOGRNA  No results found for: HGBA, HGBA2QUANT, HGBFQUANT, HGBSQUAN (Hemoglobinopathy evaluation)   No results found for: LDH  No results found for: IRON, TIBC, IRONPCTSAT (Iron and TIBC)  No results found for: FERRITIN  Urinalysis No results found for: COLORURINE, APPEARANCEUR, LABSPEC, PHURINE, GLUCOSEU, HGBUR,  BILIRUBINUR, KETONESUR, PROTEINUR, UROBILINOGEN, NITRITE, LEUKOCYTESUR   STUDIES: MR BREAST BILATERAL W WO CONTRAST INC CAD  Result Date: 09/26/2019 CLINICAL DATA:  29 year old female with known right breast cancer presenting for re-evaluation after neoadjuvant chemotherapy. LABS:  None performed on site. EXAM: BILATERAL BREAST MRI WITH AND WITHOUT CONTRAST TECHNIQUE: Multiplanar, multisequence MR images of both breasts were obtained prior to and following the intravenous administration of 10 ml of Gadavist. Three-dimensional MR images were rendered by post-processing of the original MR data on an independent workstation. The three-dimensional MR images were interpreted, and findings are reported in the following complete MRI report for this study. Three dimensional images were evaluated at the independent DynaCad workstation COMPARISON:  Previous exam(s). FINDINGS: Breast composition: d. Extreme fibroglandular tissue. Background parenchymal enhancement: Mild. Right breast: Previously described large, enhancing mass in the central right breast has markedly decreased in size. Approximately 2 cm of residual architectural distortion remain, though no significant enhancement is seen in association with it today. No additional suspicious findings in the remainder of the right breast. Left breast: No suspicious mass or abnormal enhancement. Lymph nodes: There is been interval resolution of marked right axillary lymphadenopathy as previously described. Previously described enlarged right internal mammary chain lymph nodes have also resolved. No suspicious lymphadenopathy identified today. Ancillary findings:  None. IMPRESSION: Marked interval improvement/resolution in right breast mass and lymphadenopathy consistent with excellent treatment response. RECOMMENDATION: Per clinical treatment plan. BI-RADS CATEGORY  6: Known biopsy-proven malignancy. Electronically Signed   By: Kristopher Oppenheim M.D.   On: 09/26/2019 12:17       ELIGIBLE FOR AVAILABLE RESEARCH PROTOCOL:no  ASSESSMENT: 29 y.o. BRCA1 positive Brianna Owens woman status post right breast overlapping sites biopsy 05/04/2019 for a clinical T3 N0, stage IIIB invasive ductal carcinoma, grade 2, triple negative, with an MIB-1 of 40%.  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  The Common Hereditary Gene Panel offered by [facility] includes sequencing and/or deletion duplication testing of the following 48 genes: APC, ATM, AXIN2, BARD1, BMPR1A, BRCA1, BRCA2, BRIP1, CDH1, CDK4, CDKN2A (p14ARF), CDKN2A (p16INK4a), CHEK2, CTNNA1, DICER1, EPCAM (Deletion/duplication testing only), GREM1 (promoter region deletion/duplication testing only), KIT, MEN1, MLH1, MSH2, MSH3, MSH6, MUTYH, NBN, NF1, NHTL1, PALB2, PDGFRA, PMS2, POLD1, POLE, PTEN, RAD50, RAD51C, RAD51D, RNF43, SDHB, SDHC, SDHD, SMAD4, SMARCA4. STK11, TP53, TSC1, TSC2, and VHL.  The following genes were evaluated for sequence changes only: SDHA and HOXB13 c.251G>A variant only. The  report date is 05/24/2019.  (2) neoadjuvant chemotherapy consisting of doxorubicin and cyclophosphamide in dose dense fashion x4 started 05/26/2019, completed 07/07/2019, followed by paclitaxel and carboplatin weekly x12 started 07/21/2019  (a) breast MRI 09/26/2019 shows a complete radiologic response  (3) definitive surgery to follow  (4) adjuvant radiation to follow  (5) consider bilateral salpingo-oophorectomy at the end of breast cancer treatment  (a) receiving Lupron every 28 days   PLAN: Brianna Owens continues to tolerate treatment well, with no peripheral neuropathy and no significant toxicities. She does have some liver liver irritation from the treatment and we have dropped the dose somewhat but we hope to be able to get her through the  final 3 doses of chemotherapy.  At the breast cancer conference yesterday it was suggested that bilateral mastectomies might be a good option for her. She has very dense breasts. She also has a BRCA1 mutation which means she is at risk of developing another breast cancer in the future.  Today her first reaction was that she would like to keep her breasts. I think if she were offered bilateral nipple sparing mastectomies she might consider that. At any rate she will be meeting with Dr. Brantley Stage soon to plan her definitive surgery treatment.  We are going to see her weekly until she completes her chemotherapy  Total encounter time 25 minutes.[name] Jews C. Darnell Stimson, MD 09/29/19 1:08 PM Medical Oncology and Hematology [facility] [place], [place] [address] Tel. [phone]    Fax. [phone]   I, Wilburn Mylar, am acting as scribe for Dr. [name]. Petro Talent.  I, Lurline Del MD, have reviewed the above documentation for accuracy and completeness, and I agree with the above.   *Total Encounter Time as defined by the Centers for Medicare and Medicaid Services includes, in addition to the face-to-face time of a patient visit (documented in the note above) non-face-to-face time: obtaining and reviewing outside history, ordering and reviewing medications, tests or procedures, care coordination (communications with other health care professionals or caregivers) and documentation in the medical record.

## 2019-09-29 ENCOUNTER — Inpatient Hospital Stay: Payer: Self-pay

## 2019-09-29 ENCOUNTER — Other Ambulatory Visit: Payer: Self-pay

## 2019-09-29 ENCOUNTER — Inpatient Hospital Stay (HOSPITAL_BASED_OUTPATIENT_CLINIC_OR_DEPARTMENT_OTHER): Payer: Self-pay | Admitting: Oncology

## 2019-09-29 ENCOUNTER — Encounter: Payer: Self-pay | Admitting: *Deleted

## 2019-09-29 VITALS — BP 115/56 | HR 94 | Temp 99.1°F | Resp 18 | Ht 68.0 in | Wt 162.7 lb

## 2019-09-29 DIAGNOSIS — C50811 Malignant neoplasm of overlapping sites of right female breast: Secondary | ICD-10-CM

## 2019-09-29 DIAGNOSIS — Z171 Estrogen receptor negative status [ER-]: Secondary | ICD-10-CM

## 2019-09-29 DIAGNOSIS — Z95828 Presence of other vascular implants and grafts: Secondary | ICD-10-CM

## 2019-09-29 LAB — COMPREHENSIVE METABOLIC PANEL
ALT: 166 U/L — ABNORMAL HIGH (ref 0–44)
AST: 71 U/L — ABNORMAL HIGH (ref 15–41)
Albumin: 4 g/dL (ref 3.5–5.0)
Alkaline Phosphatase: 109 U/L (ref 38–126)
Anion gap: 10 (ref 5–15)
BUN: 9 mg/dL (ref 6–20)
CO2: 24 mmol/L (ref 22–32)
Calcium: 9.4 mg/dL (ref 8.9–10.3)
Chloride: 106 mmol/L (ref 98–111)
Creatinine, Ser: 0.61 mg/dL (ref 0.44–1.00)
GFR calc Af Amer: >60 mL/min (ref >60)
GFR calc non Af Amer: >60 mL/min (ref >60)
Glucose, Bld: 109 mg/dL — ABNORMAL HIGH (ref 70–99)
Potassium: 3.9 mmol/L (ref 3.5–5.1)
Sodium: 140 mmol/L (ref 135–145)
Total Bilirubin: 0.3 mg/dL (ref 0.3–1.2)
Total Protein: 8 g/dL (ref 6.5–8.1)

## 2019-09-29 LAB — CBC WITH DIFFERENTIAL/PLATELET
Abs Immature Granulocytes: 0.02 10*3/uL (ref 0.00–0.07)
Basophils Absolute: 0 10*3/uL (ref 0.0–0.1)
Basophils Relative: 1 %
Eosinophils Absolute: 0.1 10*3/uL (ref 0.0–0.5)
Eosinophils Relative: 2 %
HCT: 29.7 % — ABNORMAL LOW (ref 36.0–46.0)
Hemoglobin: 10.3 g/dL — ABNORMAL LOW (ref 12.0–15.0)
Immature Granulocytes: 0 %
Lymphocytes Relative: 31 %
Lymphs Abs: 1.4 10*3/uL (ref 0.7–4.0)
MCH: 34 pg (ref 26.0–34.0)
MCHC: 34.7 g/dL (ref 30.0–36.0)
MCV: 98 fL (ref 80.0–100.0)
Monocytes Absolute: 0.5 10*3/uL (ref 0.1–1.0)
Monocytes Relative: 10 %
Neutro Abs: 2.7 10*3/uL (ref 1.7–7.7)
Neutrophils Relative %: 56 %
Platelets: 174 10*3/uL (ref 150–400)
RBC: 3.03 MIL/uL — ABNORMAL LOW (ref 3.87–5.11)
RDW: 13.5 % (ref 11.5–15.5)
WBC: 4.7 10*3/uL (ref 4.0–10.5)
nRBC: 0 % (ref 0.0–0.2)

## 2019-09-29 LAB — PREGNANCY, URINE: Preg Test, Ur: NEGATIVE

## 2019-09-29 MED ORDER — SODIUM CHLORIDE 0.9% FLUSH
10.0000 mL | INTRAVENOUS | Status: DC | PRN
  Administered 2019-09-29: 10 mL via INTRAVENOUS
  Filled 2019-09-29: qty 10

## 2019-09-29 MED ORDER — DIPHENHYDRAMINE HCL 50 MG/ML IJ SOLN
25.0000 mg | Freq: Once | INTRAMUSCULAR | Status: AC
  Administered 2019-09-29: 25 mg via INTRAVENOUS

## 2019-09-29 MED ORDER — HEPARIN SOD (PORK) LOCK FLUSH 100 UNIT/ML IV SOLN
500.0000 [IU] | Freq: Once | INTRAVENOUS | Status: DC
  Filled 2019-09-29: qty 5

## 2019-09-29 MED ORDER — DEXAMETHASONE SODIUM PHOSPHATE 10 MG/ML IJ SOLN: 10.0000 mg | Freq: Once | INTRAMUSCULAR | Status: DC

## 2019-09-29 MED ORDER — SODIUM CHLORIDE 0.9 % IV SOLN
Freq: Once | INTRAVENOUS | Status: AC
  Filled 2019-09-29: qty 250

## 2019-09-29 MED ORDER — PALONOSETRON HCL INJECTION 0.25 MG/5ML
0.2500 mg | Freq: Once | INTRAVENOUS | Status: AC
  Administered 2019-09-29: 0.25 mg via INTRAVENOUS

## 2019-09-29 MED ORDER — SODIUM CHLORIDE 0.9 % IV SOLN
287.8000 mg | Freq: Once | INTRAVENOUS | Status: AC
  Administered 2019-09-29: 16:00:00 290 mg via INTRAVENOUS
  Filled 2019-09-29: qty 29

## 2019-09-29 MED ORDER — SODIUM CHLORIDE 0.9 % IV SOLN
65.0000 mg/m2 | Freq: Once | INTRAVENOUS | Status: AC
  Administered 2019-09-29: 120 mg via INTRAVENOUS
  Filled 2019-09-29: qty 20

## 2019-09-29 MED ORDER — SODIUM CHLORIDE 0.9 % IV SOLN
10.0000 mg | Freq: Once | INTRAVENOUS | Status: AC
  Administered 2019-09-29: 10 mg via INTRAVENOUS
  Filled 2019-09-29: qty 10

## 2019-09-29 MED ORDER — HEPARIN SOD (PORK) LOCK FLUSH 100 UNIT/ML IV SOLN
500.0000 [IU] | Freq: Once | INTRAVENOUS | Status: AC | PRN
  Administered 2019-09-29: 500 [IU]
  Filled 2019-09-29: qty 5

## 2019-09-29 MED ORDER — DIPHENHYDRAMINE HCL 50 MG/ML IJ SOLN
INTRAMUSCULAR | Status: AC
  Filled 2019-09-29: qty 1

## 2019-09-29 MED ORDER — FAMOTIDINE IN NACL 20-0.9 MG/50ML-% IV SOLN
20.0000 mg | Freq: Once | INTRAVENOUS | Status: AC
  Administered 2019-09-29: 20 mg via INTRAVENOUS

## 2019-09-29 MED ORDER — PALONOSETRON HCL INJECTION 0.25 MG/5ML
INTRAVENOUS | Status: AC
  Filled 2019-09-29: qty 5

## 2019-09-29 MED ORDER — FAMOTIDINE IN NACL 20-0.9 MG/50ML-% IV SOLN
INTRAVENOUS | Status: AC
  Filled 2019-09-29: qty 50

## 2019-09-29 MED ORDER — SODIUM CHLORIDE 0.9% FLUSH
10.0000 mL | INTRAVENOUS | Status: DC | PRN
  Administered 2019-09-29: 10 mL
  Filled 2019-09-29: qty 10

## 2019-09-29 NOTE — Patient Instructions (Signed)
COVID-19 Vaccine Information can be found at: [email or web address] For questions related to vaccine distribution or appointments, please email [email or web address] or call [phone].   [facility] Discharge Instructions for Patients Receiving Chemotherapy  Today you received the following chemotherapy agents: Paclitaxel (Taxol) and Carboplatin (Paraplatin)  To help prevent nausea and vomiting after your treatment, we encourage you to take your nausea medication as directed by your provider.   If you develop nausea and vomiting that is not controlled by your nausea medication, call the clinic.   BELOW ARE SYMPTOMS THAT SHOULD BE REPORTED IMMEDIATELY:  *FEVER GREATER THAN 100.5 F  *CHILLS WITH OR WITHOUT FEVER  NAUSEA AND VOMITING THAT IS NOT CONTROLLED WITH YOUR NAUSEA MEDICATION  *UNUSUAL SHORTNESS OF BREATH  *UNUSUAL BRUISING OR BLEEDING  TENDERNESS IN MOUTH AND THROAT WITH OR WITHOUT PRESENCE OF ULCERS  *URINARY PROBLEMS  *BOWEL PROBLEMS  UNUSUAL RASH Items with * indicate a potential emergency and should be followed up as soon as possible.  Feel free to call the clinic should you have any questions or concerns. The clinic phone number is [phone].  Please show the [facility] at check-in to the Emergency Department and triage nurse.  Coronavirus (COVID-19) Are you at risk?  Are you at risk for the Coronavirus (COVID-19)?  To be considered HIGH RISK for Coronavirus (COVID-19), you have to meet the following criteria:  . Traveled to [place], [place], [place], [place] or [place]; or in the [place] to [place], [place], [place], or [place]; and have fever, cough, and shortness of breath within the last 2 weeks of travel OR . Been in close contact with a person diagnosed with COVID-19 within the last 2 weeks and have fever, cough, and shortness of breath . IF YOU DO NOT MEET  THESE CRITERIA, YOU ARE CONSIDERED LOW RISK FOR COVID-19.  What to do if you are HIGH RISK for COVID-19?  Marland Kitchen If you are having a medical emergency, call 911. . Seek medical care right away. Before you go to a doctor's office, urgent care or emergency department, call ahead and tell them about your recent travel, contact with someone diagnosed with COVID-19, and your symptoms. You should receive instructions from your physician's office regarding next steps of care.  . When you arrive at healthcare provider, tell the healthcare staff immediately you have returned from visiting [place], [place], [place], [place] or [place]; or traveled in the [place] to [place], [place], [place], or [place]; in the last two weeks or you have been in close contact with a person diagnosed with COVID-19 in the last 2 weeks.   . Tell the health care staff about your symptoms: fever, cough and shortness of breath. . After you have been seen by a medical provider, you will be either: o Tested for (COVID-19) and discharged home on quarantine except to seek medical care if symptoms worsen, and asked to  - Stay home and avoid contact with others until you get your results (4-5 days)  - Avoid travel on public transportation if possible (such as bus, train, or airplane) or o Sent to the Emergency Department by EMS for evaluation, COVID-19 testing, and possible admission depending on your condition and test results.  What to do if you are LOW RISK for COVID-19?  Reduce your risk of any infection by using the same precautions used for avoiding the common cold or flu:  Marland Kitchen Wash your hands often with soap and warm  water for at least 20 seconds.  If soap and water are not readily available, use an alcohol-based hand sanitizer with at least 60% alcohol.  . If coughing or sneezing, cover your mouth and nose by coughing or sneezing into the elbow areas of your shirt or coat, into a tissue or into your sleeve (not your  hands). . Avoid shaking hands with others and consider head nods or verbal greetings only. . Avoid touching your eyes, nose, or mouth with unwashed hands.  . Avoid close contact with people who are sick. . Avoid places or events with large numbers of people in one location, like concerts or sporting events. . Carefully consider travel plans you have or are making. . If you are planning any travel outside or inside the [place], visit the CDC's Travelers' Health webpage for the latest health notices. . If you have some symptoms but not all symptoms, continue to monitor at home and seek medical attention if your symptoms worsen. . If you are having a medical emergency, call 911.   [place] / e-Visit: [email or web address]         [facility]: [facility]: [phone]                   [facility]: [phone]

## 2019-10-03 ENCOUNTER — Telehealth: Payer: Self-pay | Admitting: Oncology

## 2019-10-03 NOTE — Telephone Encounter (Signed)
Scheduled appts per 4/15 los. Interpreter Brianna Owens will call pt to confirm appts. Pt to get updated appt calender at next visit.

## 2019-10-06 ENCOUNTER — Encounter: Payer: Self-pay | Admitting: *Deleted

## 2019-10-06 ENCOUNTER — Other Ambulatory Visit: Payer: Self-pay

## 2019-10-06 ENCOUNTER — Inpatient Hospital Stay: Payer: Self-pay

## 2019-10-06 ENCOUNTER — Encounter: Payer: Self-pay | Admitting: Adult Health

## 2019-10-06 ENCOUNTER — Inpatient Hospital Stay (HOSPITAL_BASED_OUTPATIENT_CLINIC_OR_DEPARTMENT_OTHER): Payer: Self-pay | Admitting: Adult Health

## 2019-10-06 VITALS — BP 117/77 | HR 91 | Temp 99.1°F | Resp 18 | Ht 68.0 in | Wt 166.3 lb

## 2019-10-06 DIAGNOSIS — Z1501 Genetic susceptibility to malignant neoplasm of breast: Secondary | ICD-10-CM

## 2019-10-06 DIAGNOSIS — C50811 Malignant neoplasm of overlapping sites of right female breast: Secondary | ICD-10-CM

## 2019-10-06 DIAGNOSIS — Z95828 Presence of other vascular implants and grafts: Secondary | ICD-10-CM

## 2019-10-06 DIAGNOSIS — Z01419 Encounter for gynecological examination (general) (routine) without abnormal findings: Secondary | ICD-10-CM

## 2019-10-06 DIAGNOSIS — Z1502 Genetic susceptibility to malignant neoplasm of ovary: Secondary | ICD-10-CM

## 2019-10-06 DIAGNOSIS — Z171 Estrogen receptor negative status [ER-]: Secondary | ICD-10-CM

## 2019-10-06 LAB — CBC WITH DIFFERENTIAL/PLATELET
Abs Immature Granulocytes: 0.01 10*3/uL (ref 0.00–0.07)
Basophils Absolute: 0 10*3/uL (ref 0.0–0.1)
Basophils Relative: 0 %
Eosinophils Absolute: 0.1 10*3/uL (ref 0.0–0.5)
Eosinophils Relative: 1 %
HCT: 27.3 % — ABNORMAL LOW (ref 36.0–46.0)
Hemoglobin: 9.4 g/dL — ABNORMAL LOW (ref 12.0–15.0)
Immature Granulocytes: 0 %
Lymphocytes Relative: 26 %
Lymphs Abs: 1 10*3/uL (ref 0.7–4.0)
MCH: 33.8 pg (ref 26.0–34.0)
MCHC: 34.4 g/dL (ref 30.0–36.0)
MCV: 98.2 fL (ref 80.0–100.0)
Monocytes Absolute: 0.3 10*3/uL (ref 0.1–1.0)
Monocytes Relative: 6 %
Neutro Abs: 2.7 10*3/uL (ref 1.7–7.7)
Neutrophils Relative %: 67 %
Platelets: 156 10*3/uL (ref 150–400)
RBC: 2.78 MIL/uL — ABNORMAL LOW (ref 3.87–5.11)
RDW: 13.1 % (ref 11.5–15.5)
WBC: 4.1 10*3/uL (ref 4.0–10.5)
nRBC: 0 % (ref 0.0–0.2)

## 2019-10-06 LAB — COMPREHENSIVE METABOLIC PANEL
ALT: 202 U/L — ABNORMAL HIGH (ref 0–44)
AST: 94 U/L — ABNORMAL HIGH (ref 15–41)
Albumin: 3.9 g/dL (ref 3.5–5.0)
Alkaline Phosphatase: 100 U/L (ref 38–126)
Anion gap: 8 (ref 5–15)
BUN: 9 mg/dL (ref 6–20)
CO2: 23 mmol/L (ref 22–32)
Calcium: 9.2 mg/dL (ref 8.9–10.3)
Chloride: 107 mmol/L (ref 98–111)
Creatinine, Ser: 0.58 mg/dL (ref 0.44–1.00)
GFR calc Af Amer: >60 mL/min (ref >60)
GFR calc non Af Amer: >60 mL/min (ref >60)
Glucose, Bld: 140 mg/dL — ABNORMAL HIGH (ref 70–99)
Potassium: 3.9 mmol/L (ref 3.5–5.1)
Sodium: 138 mmol/L (ref 135–145)
Total Bilirubin: 0.2 mg/dL — ABNORMAL LOW (ref 0.3–1.2)
Total Protein: 7.5 g/dL (ref 6.5–8.1)

## 2019-10-06 LAB — PREGNANCY, URINE: Preg Test, Ur: NEGATIVE

## 2019-10-06 MED ORDER — SODIUM CHLORIDE 0.9% FLUSH
10.0000 mL | INTRAVENOUS | Status: DC | PRN
  Administered 2019-10-06: 10 mL via INTRAVENOUS
  Filled 2019-10-06: qty 10

## 2019-10-06 MED ORDER — FAMOTIDINE IN NACL 20-0.9 MG/50ML-% IV SOLN
INTRAVENOUS | Status: AC
  Filled 2019-10-06: qty 50

## 2019-10-06 MED ORDER — HEPARIN SOD (PORK) LOCK FLUSH 100 UNIT/ML IV SOLN
500.0000 [IU] | Freq: Once | INTRAVENOUS | Status: AC
  Administered 2019-10-06: 500 [IU]
  Filled 2019-10-06: qty 5

## 2019-10-06 MED ORDER — DIPHENHYDRAMINE HCL 50 MG/ML IJ SOLN
INTRAMUSCULAR | Status: AC
  Filled 2019-10-06: qty 1

## 2019-10-06 MED ORDER — SODIUM CHLORIDE 0.9% FLUSH
10.0000 mL | Freq: Once | INTRAVENOUS | Status: AC
  Administered 2019-10-06: 10 mL
  Filled 2019-10-06: qty 10

## 2019-10-06 MED ORDER — PALONOSETRON HCL INJECTION 0.25 MG/5ML
INTRAVENOUS | Status: AC
  Filled 2019-10-06: qty 5

## 2019-10-06 NOTE — Patient Instructions (Signed)
Cita con Doctor Cornette es el Lunes 26 de Abril a las 2.15 p.m.   10/10/2019 215pm [facility] [address], [address].  [place], [place].

## 2019-10-06 NOTE — Progress Notes (Signed)
[facility]  [phone]) [phone] [phone]) [phone]     ID: Brianna Owens DOB: 1991/05/28  MR#: [id number]  SWN#:[id number]  Patient Care Team: Patient, [name] as PCP - General (General Practice) Magrinat, [name], MD as Consulting Physician (Oncology) Erroll Luna, MD as Consulting Physician (General Surgery) [name], RN as Oncology Nurse Navigator [name], RN as Oncology Nurse Navigator Scot Dock, NP OTHER MD:  CHIEF COMPLAINT: Triple negative breast cancer, BRCA1 positive  CURRENT TREATMENT: neoadjuvant chemotherapy   INTERVAL HISTORY: Brianna Owens returns today for follow up and treatment of her triple negative breast cancer.  We are accompanied by Brianna Owens on [place] interpreter via the [facility].    She is currently receiving paclitaxel and carboplatin. Today is the tenth of 12 planned weekly doses. She says she is tolerating this well.  She does have some elevation in her liver enzymes due to this treatment, and we are following that closely.  She denies peripheral neuropathy.   Brianna Owens missed her surgery appt with Dr. Brantley Stage.  She notes she is available any time to reschedule.   REVIEW OF SYSTEMS: Brianna Owens is doing well today.  She denies any fever, chills, chest pain, palpitations, cough, shortness of breath, bowel/bladder changes, headaches, vision issues, fatigue, or any other concerns.  A detailed ROS was otherwise non contributory.     HISTORY OF CURRENT ILLNESS: From the original intake note:  Brianna Owens [facility] with a 3 month history of a right breast lump that became painful in early 04/2019. Physical exam performed at that time showed a palpable, tender 13 cm lump within the right center breast under the nipple area. She underwent right breast ultrasonography at [facility] on 04/27/2019 showing: 6.1 cm palpable mass centered in the  5 o'clock position of the right breast; single right inferomedial axillary lymph node with focal cortical thickening inferiorly.  Accordingly on 05/04/2019 she proceeded to biopsy of the right breast area in question. The pathology from this procedure ([id number]) showed: invasive ductal carcinoma, grade 2-3. Prognostic indicators significant for: estrogen receptor, 0% negative and progesterone receptor, 0% negative. Proliferation marker Ki67 at 40%.  I do not find HER-2 receptor documentation  The right axillary lymph node biopsied at that time showed reactive germinal centers.  She underwent bilateral diagnostic mammography with tomography at [facility] on 05/05/2019 showing: breast density category D; biopsy-proven right breast malignancy measures 6.1 cm; no additional masses or calcifications seen in the right breast; no evidence of malignancy in the left breast.  The patient's subsequent history is as detailed below.   PAST MEDICAL HISTORY: No past medical history on file.  PAST SURGICAL HISTORY: Past Surgical History:  Procedure Laterality Date  . IR IMAGING GUIDED PORT INSERTION  05/24/2019    FAMILY HISTORY: Family History  Problem Relation Age of Onset  . Breast cancer Mother   . Breast cancer Maternal Aunt    Patient's father is 1 and her mother 34 as of November 2020.  The patient's mother was diagnosed with breast cancer in her early 28s.  She lives in [place]. The patient's mother's sister was also diagnosed with breast cancer in her early 40s.  The patient herself has 1 sister, no brothers.  She is not aware of any ovarian cancer cases in the family.   GYNECOLOGIC HISTORY:  No LMP recorded. Menarche: 29 years old Age at first live birth: 29 years old  [place] P 2 LMPregular Contraceptive HRT n/a  Hysterectomy? no BSO? no   SOCIAL HISTORY: (updated 04/2019)  Brianna Owens is currently working at [employer].  She is originally from [place].  She is divorced.  Her  children are Brianna Owens, 55 years old, living in [place] with his father, and Brianna Owens, 59 years old, who lives with the patient.  Also at home are her uncle Brianna Owens, his wife, and that wife's cousin.     ADVANCED DIRECTIVES: Not in place   HEALTH MAINTENANCE: Social History   Tobacco Use  . Smoking status: Never Smoker  . Smokeless tobacco: Never Used  Substance Use Topics  . Alcohol use: Not Currently  . Drug use: Not Currently     Colonoscopy: n/a  PAP: 04/26/2019, negative  Bone density: n/a   No Known Allergies  Current Outpatient Medications  Medication Sig Dispense Refill  . lidocaine-prilocaine (EMLA) cream Apply to affected area once 30 g 3  . loratadine (CLARITIN) 10 MG tablet Take 1 tablet (10 mg total) by mouth daily. 60 tablet 0  . prochlorperazine (COMPAZINE) 10 MG tablet Take 1 tablet (10 mg total) by mouth every 6 (six) hours as needed (Nausea or vomiting). 30 tablet 1  . venlafaxine XR (EFFEXOR-XR) 75 MG 24 hr capsule Take 1 capsule (75 mg total) by mouth daily with breakfast. 30 capsule 4   No current facility-administered medications for this visit.    OBJECTIVE:  Spanish speaker in no acute distress  Vitals:   10/06/19 1257  BP: 117/77  Pulse: 91  Resp: 18  Temp: 99.1 F (37.3 C)  SpO2: 100%     Body mass index is 25.29 kg/m.   Wt Readings from Last 3 Encounters:  10/06/19 166 lb 4.8 oz (75.4 kg)  09/29/19 162 lb 11.2 oz (73.8 kg)  09/22/19 162 lb 6.4 oz (73.7 kg)      ECOG FS:1 - Symptomatic but completely ambulatory GENERAL: Patient is a well appearing female in no acute distress HEENT:  Sclerae anicteric.  Mask in place. Neck is supple.  NODES:  No cervical, supraclavicular, or axillary lymphadenopathy palpated.  BREAST EXAM:  Deferred. LUNGS:  Clear to auscultation bilaterally.  No wheezes or rhonchi. HEART:  Regular rate and rhythm. No murmur appreciated. ABDOMEN:  Soft, nontender.  Positive, normoactive bowel sounds. No organomegaly  palpated. MSK:  No focal spinal tenderness to palpation. Full range of motion bilaterally in the upper extremities. EXTREMITIES:  No peripheral edema.   SKIN:  Clear with no obvious rashes or skin changes. No nail dyscrasia. NEURO:  Nonfocal. Well oriented.  Appropriate affect.    LAB RESULTS:  CMP     Component Value Date/Time   NA 140 09/29/2019 1236   K 3.9 09/29/2019 1236   CL 106 09/29/2019 1236   CO2 24 09/29/2019 1236   GLUCOSE 109 (H) 09/29/2019 1236   BUN 9 09/29/2019 1236   CREATININE 0.61 09/29/2019 1236   CREATININE 0.45 07/21/2019 1025   CALCIUM 9.4 09/29/2019 1236   PROT 8.0 09/29/2019 1236   ALBUMIN 4.0 09/29/2019 1236   AST 71 (H) 09/29/2019 1236   AST 29 07/21/2019 1025   ALT 166 (H) 09/29/2019 1236   ALT 58 (H) 07/21/2019 1025   ALKPHOS 109 09/29/2019 1236   BILITOT 0.3 09/29/2019 1236   BILITOT 0.3 07/21/2019 1025   GFRNONAA >60 09/29/2019 1236   GFRNONAA >60 07/21/2019 1025   GFRAA >60 09/29/2019 1236   GFRAA >60 07/21/2019 1025  No results found for: TOTALPROTELP, ALBUMINELP, A1GS, A2GS, BETS, BETA2SER, GAMS, MSPIKE, SPEI  No results found for: KPAFRELGTCHN, LAMBDASER, KAPLAMBRATIO  Lab Results  Component Value Date   WBC 4.1 10/06/2019   NEUTROABS 2.7 10/06/2019   HGB 9.4 (L) 10/06/2019   HCT 27.3 (L) 10/06/2019   MCV 98.2 10/06/2019   PLT 156 10/06/2019    No results found for: LABCA2  No components found for: IWPYKD983  No results for input(s): INR in the last 168 hours.  No results found for: LABCA2  No results found for: JAS505  No results found for: LZJ673  No results found for: ALP379  No results found for: CA2729  No components found for: HGQUANT  No results found for: CEA1 / No results found for: CEA1   No results found for: AFPTUMOR  No results found for: CHROMOGRNA  No results found for: HGBA, HGBA2QUANT, HGBFQUANT, HGBSQUAN (Hemoglobinopathy evaluation)   No results found for: LDH  No results found for:  IRON, TIBC, IRONPCTSAT (Iron and TIBC)  No results found for: FERRITIN  Urinalysis No results found for: COLORURINE, APPEARANCEUR, LABSPEC, PHURINE, GLUCOSEU, HGBUR, BILIRUBINUR, KETONESUR, PROTEINUR, UROBILINOGEN, NITRITE, LEUKOCYTESUR   STUDIES: MR BREAST BILATERAL W WO CONTRAST INC CAD  Result Date: 09/26/2019 CLINICAL DATA:  29 year old female with known right breast cancer presenting for re-evaluation after neoadjuvant chemotherapy. LABS:  None performed on site. EXAM: BILATERAL BREAST MRI WITH AND WITHOUT CONTRAST TECHNIQUE: Multiplanar, multisequence MR images of both breasts were obtained prior to and following the intravenous administration of 10 ml of Gadavist. Three-dimensional MR images were rendered by post-processing of the original MR data on an independent workstation. The three-dimensional MR images were interpreted, and findings are reported in the following complete MRI report for this study. Three dimensional images were evaluated at the independent DynaCad workstation COMPARISON:  Previous exam(s). FINDINGS: Breast composition: d. Extreme fibroglandular tissue. Background parenchymal enhancement: Mild. Right breast: Previously described large, enhancing mass in the central right breast has markedly decreased in size. Approximately 2 cm of residual architectural distortion remain, though no significant enhancement is seen in association with it today. No additional suspicious findings in the remainder of the right breast. Left breast: No suspicious mass or abnormal enhancement. Lymph nodes: There is been interval resolution of marked right axillary lymphadenopathy as previously described. Previously described enlarged right internal mammary chain lymph nodes have also resolved. No suspicious lymphadenopathy identified today. Ancillary findings:  None. IMPRESSION: Marked interval improvement/resolution in right breast mass and lymphadenopathy consistent with excellent treatment  response. RECOMMENDATION: Per clinical treatment plan. BI-RADS CATEGORY  6: Known biopsy-proven malignancy. Electronically Signed   By: Kristopher Oppenheim M.D.   On: 09/26/2019 12:17     ELIGIBLE FOR AVAILABLE RESEARCH PROTOCOL:no  ASSESSMENT: 29 y.o. BRCA1 positive [name] woman status post right breast overlapping sites biopsy 05/04/2019 for a clinical T3 N0, stage IIIB invasive ductal carcinoma, grade 2, triple negative, with an MIB-1 of 40%.  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  The Common Hereditary Gene Panel offered by [facility] includes sequencing and/or deletion duplication testing of the following 48 genes: APC, ATM, AXIN2, BARD1, BMPR1A, BRCA1, BRCA2, BRIP1, CDH1, CDK4, CDKN2A (p14ARF), CDKN2A (p16INK4a), CHEK2, CTNNA1, DICER1, EPCAM (Deletion/duplication testing only), GREM1 (promoter region deletion/duplication testing only), KIT, MEN1, MLH1, MSH2, MSH3, MSH6, MUTYH, NBN, NF1, NHTL1, PALB2, PDGFRA, PMS2, POLD1,  POLE, PTEN, RAD50, RAD51C, RAD51D, RNF43, SDHB, SDHC, SDHD, SMAD4, SMARCA4. STK11, TP53, TSC1, TSC2, and VHL.  The following genes were evaluated for sequence changes only: SDHA and HOXB13 c.251G>A variant only. The report date is 05/24/2019.  (2) neoadjuvant chemotherapy consisting of doxorubicin and cyclophosphamide in dose dense fashion x4 started 05/26/2019, completed 07/07/2019, followed by paclitaxel and carboplatin weekly x12 started 07/21/2019  (a) breast MRI 09/26/2019 shows a complete radiologic response  (3) definitive surgery to follow  (4) adjuvant radiation to follow  (5) consider bilateral salpingo-oophorectomy at the end of breast cancer treatment  (a) receiving Lupron every 28 days   PLAN: Charene is feeling well today.  She will forego treatment with Paclitaxel  today due to her liver enzyme elevation.  We will re evaluate next week.  She is not drinking any ETOH and she is not taking increased amounts of tylenol.  I recommended she continue to refrain from these activities, which she understands.  Henchy missed her appointment with Dr. Brantley Stage, so we called and got it rescheduled while I had the interpreter on the line.  I was able (with the interpreter's assistance) to give her appointment in [place] in writing.  She was very Patent attorney.  She will see Dr. Brantley Stage on 4/26 at 215.    Stewart will return next week for labs, f/u and her next treatment.  She was recommended to continue with the appropriate pandemic precautions. She knows to call for any questions that may arise between now and her next appointment.  We are happy to see her sooner if needed.   We are going to see her weekly until she completes her chemotherapy  Total encounter time 20 minutes.Wilber Bihari, NP 10/06/19 4:19 PM Medical Oncology and Hematology [facility] [place], [place] [address] Tel. [phone]    Fax. [phone]     *Total Encounter Time as defined by the Centers for Medicare and Medicaid Services includes, in addition to the face-to-face time of a patient visit (documented in the note above) non-face-to-face time: obtaining and reviewing outside history, ordering and reviewing medications, tests or procedures, care coordination (communications with other health care professionals or caregivers) and documentation in the medical record.

## 2019-10-07 ENCOUNTER — Telehealth: Payer: Self-pay | Admitting: Adult Health

## 2019-10-07 NOTE — Telephone Encounter (Signed)
No 4/22 los. No changes made to pt's schedule.  

## 2019-10-10 ENCOUNTER — Ambulatory Visit: Payer: Self-pay | Admitting: Surgery

## 2019-10-10 DIAGNOSIS — C50911 Malignant neoplasm of unspecified site of right female breast: Secondary | ICD-10-CM

## 2019-10-10 NOTE — Progress Notes (Signed)

## 2019-10-10 NOTE — H&P (Signed)
[facility] Guest Appointment: 10/10/2019 2:40 PM Location: [facility] Patient #: [id number] DOB: 1991-05-09 Unknown / Language: Undefined / Race: Undefined Female  History of Present Illness Brianna Owens A. Brianna Randle MD; 10/10/2019 3:46 PM) Patient words: Patient returns after completion of neoadjuvant chemotherapy for a T3 N0 right breast cancer. Repeat magnetic resonance imaging shows an almost complete pathological response. Her initial biopsy of lymph node was benign prior to initiation of chemotherapy. A translator is available since she speaks no [place]. She has no complaints and has done well with chemotherapy.  The patient is a 29 year old female.   Allergies (Chanel Teressa Senter, CMA; 10/10/2019 2:55 PM) No Known Drug Allergies [05/06/2019]: Allergies Reconciled  Medication History (Chanel Teressa Senter, [place]; 10/10/2019 2:55 PM) No Current Medications Medications Reconciled    Vitals (Chanel Nolan CMA; 10/10/2019 2:55 PM) 10/10/2019 2:55 PM Weight: 162.38 lb Height: 62in Body Surface Area: 1.75 m Body Mass Index: 29.7 kg/m  Temp.: 98.38F  Pulse: 104 (Regular)         Physical Exam (Tyneshia Stivers A. Chivonne Rascon MD; 10/10/2019 3:46 PM)  General Mental Status-Alert. General Appearance-Consistent with stated age. Hydration-Well hydrated. Voice-Normal.  Neurologic Neurologic evaluation reveals -alert and oriented x 3 with no impairment of recent or remote memory. Mental Status-Normal.    Assessment & Plan (Channon Ambrosini A. Roldan Laforest MD; 10/10/2019 3:48 PM)  RIGHT BREAST CANCER WITH T3 TUMOR, >5 CM IN GREATEST DIMENSION (C50.911) Impression: BRCA 1  Discussed treatment options with the help of a translator. I recommended initially given her  were BRCA1 positivity,  bilateral mastectomy and reconstruction and sentinel lymph node mapping. She does not wish to undergo bilateral mastectomies at  this point time and desires lumpectomy with sentinel lymph node  mapping on the right. I discussed potential recurrence rates with her which will be extremely high given her young age and BRCA1 positivity : she understands this. I discussed the need for yearly magnetic resonance imagings to follow her closely and to expect either a new cancer recurrence of her cancer with the next 10-15 years. She understands this but still does not wish to undergo bilateral mastectomy at this time understanding the risk. We'll proceed with right breast seed lumpectomy with right axillary sentinel lymph node mapping. Port will stay in for now until final pathology reviewed. Risk of lumpectomy include bleeding, infection, seroma, more surgery, use of seed/wire, wound care, cosmetic deformity and the need for other treatments, death , blood clots, death. Pt agrees to proceed. Risk of sentinel lymph node mapping include bleeding, infection, lymphedema, shoulder pain. stiffness, dye allergy. cosmetic deformity , blood clots, death, need for more surgery. Pt agrees to proceed.  Current Plans You are being scheduled for surgery- Our schedulers will call you.  You should hear from our office's scheduling department within 5 working days about the location, date, and time of surgery. We try to make accommodations for patient's preferences in scheduling surgery, but sometimes the OR schedule or the surgeon's schedule prevents [place] from making those accommodations.  If you have not heard from our office [phone]) in 5 working days, call the office and ask for your surgeon's nurse.  If you have other questions about your diagnosis, plan, or surgery, call the office and ask for your surgeon's nurse.  Pt Education - [facility] Breast Cancer Information Given - [facility] "Breast Journey" Package We discussed the staging and pathophysiology of breast cancer. We discussed all of the different options for treatment for breast cancer including surgery, chemotherapy, radiation therapy, Herceptin,  and  antiestrogen therapy. We discussed a sentinel lymph node biopsy as she does not appear to having lymph node involvement right now. We discussed the performance of that with injection of radioactive tracer and blue dye. We discussed that she would have an incision underneath her axillary hairline. We discussed that there is a bout a 10-20% chance of having a positive node with a sentinel lymph node biopsy and we will await the permanent pathology to make any other first further decisions in terms of her treatment. One of these options might be to return to the operating room to perform an axillary lymph node dissection. We discussed about a 1-2% risk lifetime of chronic shoulder pain as well as lymphedema associated with a sentinel lymph node biopsy. We discussed the options for treatment of the breast cancer which included lumpectomy versus a mastectomy. We discussed the performance of the lumpectomy with a wire placement. We discussed a 10-20% chance of a positive margin requiring reexcision in the operating room. We also discussed that she may need radiation therapy or antiestrogen therapy or both if she undergoes lumpectomy. We discussed the mastectomy and the postoperative care for that as well. We discussed that there is no difference in her survival whether she undergoes lumpectomy with radiation therapy or antiestrogen therapy versus a mastectomy. There is a slight difference in the local recurrence rate being 3-5% with lumpectomy and about 1% with a mastectomy. We discussed the risks of operation including bleeding, infection, possible reoperation. She understands her further therapy will be based on what her stages at the time of her operation.  Pt Education - [facility] breast cancer surgery: discussed with patient and provided information. Pt Education - ABC (After Breast Cancer) Class Info: discussed with patient and provided information.

## 2019-10-12 ENCOUNTER — Other Ambulatory Visit: Payer: No Typology Code available for payment source

## 2019-10-13 ENCOUNTER — Encounter: Payer: Self-pay | Admitting: *Deleted

## 2019-10-14 ENCOUNTER — Inpatient Hospital Stay: Payer: Self-pay

## 2019-10-14 ENCOUNTER — Inpatient Hospital Stay (HOSPITAL_BASED_OUTPATIENT_CLINIC_OR_DEPARTMENT_OTHER): Payer: Self-pay | Admitting: Adult Health

## 2019-10-14 ENCOUNTER — Other Ambulatory Visit: Payer: Self-pay

## 2019-10-14 VITALS — BP 112/67 | HR 87 | Temp 98.5°F | Resp 18 | Ht 68.0 in | Wt 164.4 lb

## 2019-10-14 DIAGNOSIS — C50811 Malignant neoplasm of overlapping sites of right female breast: Secondary | ICD-10-CM

## 2019-10-14 DIAGNOSIS — Z95828 Presence of other vascular implants and grafts: Secondary | ICD-10-CM

## 2019-10-14 DIAGNOSIS — Z171 Estrogen receptor negative status [ER-]: Secondary | ICD-10-CM

## 2019-10-14 DIAGNOSIS — Z01419 Encounter for gynecological examination (general) (routine) without abnormal findings: Secondary | ICD-10-CM

## 2019-10-14 DIAGNOSIS — Z1502 Genetic susceptibility to malignant neoplasm of ovary: Secondary | ICD-10-CM

## 2019-10-14 DIAGNOSIS — Z1501 Genetic susceptibility to malignant neoplasm of breast: Secondary | ICD-10-CM

## 2019-10-14 LAB — CBC WITH DIFFERENTIAL/PLATELET
Abs Immature Granulocytes: 0.01 10*3/uL (ref 0.00–0.07)
Basophils Absolute: 0 10*3/uL (ref 0.0–0.1)
Basophils Relative: 0 %
Eosinophils Absolute: 0.1 10*3/uL (ref 0.0–0.5)
Eosinophils Relative: 1 %
HCT: 29.3 % — ABNORMAL LOW (ref 36.0–46.0)
Hemoglobin: 10 g/dL — ABNORMAL LOW (ref 12.0–15.0)
Immature Granulocytes: 0 %
Lymphocytes Relative: 30 %
Lymphs Abs: 1.2 10*3/uL (ref 0.7–4.0)
MCH: 33 pg (ref 26.0–34.0)
MCHC: 34.1 g/dL (ref 30.0–36.0)
MCV: 96.7 fL (ref 80.0–100.0)
Monocytes Absolute: 0.5 10*3/uL (ref 0.1–1.0)
Monocytes Relative: 12 %
Neutro Abs: 2.2 10*3/uL (ref 1.7–7.7)
Neutrophils Relative %: 57 %
Platelets: 180 10*3/uL (ref 150–400)
RBC: 3.03 MIL/uL — ABNORMAL LOW (ref 3.87–5.11)
RDW: 14 % (ref 11.5–15.5)
WBC: 3.9 10*3/uL — ABNORMAL LOW (ref 4.0–10.5)
nRBC: 0 % (ref 0.0–0.2)

## 2019-10-14 LAB — COMPREHENSIVE METABOLIC PANEL
ALT: 192 U/L — ABNORMAL HIGH (ref 0–44)
AST: 83 U/L — ABNORMAL HIGH (ref 15–41)
Albumin: 3.9 g/dL (ref 3.5–5.0)
Alkaline Phosphatase: 110 U/L (ref 38–126)
Anion gap: 9 (ref 5–15)
BUN: 9 mg/dL (ref 6–20)
CO2: 26 mmol/L (ref 22–32)
Calcium: 9.3 mg/dL (ref 8.9–10.3)
Chloride: 106 mmol/L (ref 98–111)
Creatinine, Ser: 0.61 mg/dL (ref 0.44–1.00)
GFR calc Af Amer: >60 mL/min (ref >60)
GFR calc non Af Amer: >60 mL/min (ref >60)
Glucose, Bld: 133 mg/dL — ABNORMAL HIGH (ref 70–99)
Potassium: 4 mmol/L (ref 3.5–5.1)
Sodium: 141 mmol/L (ref 135–145)
Total Bilirubin: 0.2 mg/dL — ABNORMAL LOW (ref 0.3–1.2)
Total Protein: 7.7 g/dL (ref 6.5–8.1)

## 2019-10-14 LAB — PREGNANCY, URINE: Preg Test, Ur: NEGATIVE

## 2019-10-14 MED ORDER — SODIUM CHLORIDE 0.9 % IV SOLN
10.0000 mg | Freq: Once | INTRAVENOUS | Status: AC
  Administered 2019-10-14: 13:00:00 10 mg via INTRAVENOUS
  Filled 2019-10-14: qty 1
  Filled 2019-10-14: qty 10

## 2019-10-14 MED ORDER — SODIUM CHLORIDE 0.9 % IV SOLN
Freq: Once | INTRAVENOUS | Status: AC
  Filled 2019-10-14: qty 250

## 2019-10-14 MED ORDER — SODIUM CHLORIDE 0.9% FLUSH
10.0000 mL | Freq: Once | INTRAVENOUS | Status: AC
  Administered 2019-10-14: 10 mL
  Filled 2019-10-14: qty 10

## 2019-10-14 MED ORDER — PALONOSETRON HCL INJECTION 0.25 MG/5ML
0.2500 mg | Freq: Once | INTRAVENOUS | Status: AC
  Administered 2019-10-14: 13:00:00 0.25 mg via INTRAVENOUS

## 2019-10-14 MED ORDER — SODIUM CHLORIDE 0.9% FLUSH
10.0000 mL | INTRAVENOUS | Status: DC | PRN
  Administered 2019-10-14: 16:00:00 10 mL
  Filled 2019-10-14: qty 10

## 2019-10-14 MED ORDER — FAMOTIDINE IN NACL 20-0.9 MG/50ML-% IV SOLN
20.0000 mg | Freq: Once | INTRAVENOUS | Status: AC
  Administered 2019-10-14: 13:00:00 20 mg via INTRAVENOUS

## 2019-10-14 MED ORDER — DIPHENHYDRAMINE HCL 50 MG/ML IJ SOLN
25.0000 mg | Freq: Once | INTRAMUSCULAR | Status: AC
  Administered 2019-10-14: 25 mg via INTRAVENOUS

## 2019-10-14 MED ORDER — DIPHENHYDRAMINE HCL 50 MG/ML IJ SOLN
INTRAMUSCULAR | Status: AC
  Filled 2019-10-14: qty 1

## 2019-10-14 MED ORDER — SODIUM CHLORIDE 0.9 % IV SOLN
287.8000 mg | Freq: Once | INTRAVENOUS | Status: AC
  Administered 2019-10-14: 15:00:00 290 mg via INTRAVENOUS
  Filled 2019-10-14: qty 29

## 2019-10-14 MED ORDER — HEPARIN SOD (PORK) LOCK FLUSH 100 UNIT/ML IV SOLN
500.0000 [IU] | Freq: Once | INTRAVENOUS | Status: AC | PRN
  Administered 2019-10-14: 16:00:00 500 [IU]
  Filled 2019-10-14: qty 5

## 2019-10-14 MED ORDER — FAMOTIDINE IN NACL 20-0.9 MG/50ML-% IV SOLN
INTRAVENOUS | Status: AC
  Filled 2019-10-14: qty 50

## 2019-10-14 MED ORDER — LEUPROLIDE ACETATE 3.75 MG IM KIT
3.7500 mg | PACK | Freq: Once | INTRAMUSCULAR | Status: AC
  Administered 2019-10-14: 16:00:00 3.75 mg via INTRAMUSCULAR
  Filled 2019-10-14: qty 3.75

## 2019-10-14 MED ORDER — SODIUM CHLORIDE 0.9 % IV SOLN
65.0000 mg/m2 | Freq: Once | INTRAVENOUS | Status: AC
  Administered 2019-10-14: 14:00:00 120 mg via INTRAVENOUS
  Filled 2019-10-14: qty 20

## 2019-10-14 MED ORDER — PALONOSETRON HCL INJECTION 0.25 MG/5ML
INTRAVENOUS | Status: AC
  Filled 2019-10-14: qty 5

## 2019-10-14 NOTE — Progress Notes (Signed)
OK to treat with ALT of 192 per Wilber Bihari, NP and Dr. Jana Hakim.

## 2019-10-14 NOTE — Progress Notes (Signed)
Brianna Owens  [phone] [phone]     ID: Brianna Owens DOB: Oct 15, 1990  MR#: [id number]  JYN#:[id number]  Patient Care Team: Patient, [name] as PCP - General (General Practice) Magrinat, [name], MD as Consulting Physician (Oncology) Erroll Luna, MD as Consulting Physician (General Surgery) [name], RN as Oncology Nurse Navigator [name], RN as Oncology Nurse Navigator Scot Dock, NP OTHER MD:  CHIEF COMPLAINT: Triple negative breast cancer, BRCA1 positive  CURRENT TREATMENT: neoadjuvant chemotherapy   INTERVAL HISTORY: Brianna Owens returns today for follow up and treatment of her triple negative breast cancer.  We are accompanied by [name] our in person Spanish interpreter.     She is currently receiving paclitaxel and carboplatin. Today is the tenth of 12 planned weekly doses. She says she is tolerating this well.  She had increased ALT of 202 last week and DrMagrinat wanted to give her a week off of chemotherapy.  Today, her ALT is 192.  She is not taking tylenol or drinking etoh.    Her breast MRI was on 09/26/2019 and showed marked improvement and resolution of her right breast mass.   REVIEW OF SYSTEMS: Brianna Owens is doing well today.  For activity, she goes to the park.   She says that she met with Dr. Brantley Stage to discuss surgery and is planning on lumpectomy.  She does not want bilateral mastectomies.  She denies any peripheral neuropathy, fever, chills, chest pain, palpitations, cough, shortness of breath, bowel/bladder changes, headaches, vision issues, nausea/vomiting, or any other concerns.  A detailed ROS was otherwise non contributory.     HISTORY OF CURRENT ILLNESS: From the original intake note:  [place] [facility] with a 3 month history of a right breast lump that became painful in early 04/2019. Physical exam performed  at that time showed a palpable, tender 13 cm lump within the right center breast under the nipple area. She underwent right breast ultrasonography at [facility] on 04/27/2019 showing: 6.1 cm palpable mass centered in the 5 o'clock position of the right breast; single right inferomedial axillary lymph node with focal cortical thickening inferiorly.  Accordingly on 05/04/2019 she proceeded to biopsy of the right breast area in question. The pathology from this procedure ([id number]) showed: invasive ductal carcinoma, grade 2-3. Prognostic indicators significant for: estrogen receptor, 0% negative and progesterone receptor, 0% negative. Proliferation marker Ki67 at 40%.  I do not find HER-2 receptor documentation  The right axillary lymph node biopsied at that time showed reactive germinal centers.  She underwent bilateral diagnostic mammography with tomography at [facility] on 05/05/2019 showing: breast density category D; biopsy-proven right breast malignancy measures 6.1 cm; no additional masses or calcifications seen in the right breast; no evidence of malignancy in the left breast.  The patient's subsequent history is as detailed below.   PAST MEDICAL HISTORY: No past medical history on file.  PAST SURGICAL HISTORY: Past Surgical History:  Procedure Laterality Date  . IR IMAGING GUIDED PORT INSERTION  05/24/2019    FAMILY HISTORY: Family History  Problem Relation Age of Onset  . Breast cancer Mother   . Breast cancer Maternal Aunt    Patient's father is 25 and her mother 25 as of November 2020.  The patient's mother was diagnosed with breast cancer in her early 32s.  She lives in [place]. The patient's mother's sister was also diagnosed with breast cancer in  her early 70s.  The patient herself has 1 sister, no brothers.  She is not aware of any ovarian cancer cases in the family.   GYNECOLOGIC HISTORY:  No LMP recorded. Menarche: 29 years old Age at first live birth:  29 years old [place] P 2 LMPregular Contraceptive HRT n/a  Hysterectomy? no BSO? no   SOCIAL HISTORY: (updated 04/2019)  Estephany is currently working at [employer].  She is originally from [place].  She is divorced.  Her children are Brianna Owens, 4 years old, living in [place] with his father, and Brianna Owens, 87 years old, who lives with the patient.  Also at home are her uncle [name], his wife, and that wife's cousin.     ADVANCED DIRECTIVES: Not in place   HEALTH MAINTENANCE: Social History   Tobacco Use  . Smoking status: Never Smoker  . Smokeless tobacco: Never Used  Substance Use Topics  . Alcohol use: Not Currently  . Drug use: Not Currently     Colonoscopy: n/a  PAP: 04/26/2019, negative  Bone density: n/a   No Known Allergies  Current Outpatient Medications  Medication Sig Dispense Refill  . lidocaine-prilocaine (EMLA) cream Apply to affected area once 30 g 3  . loratadine (CLARITIN) 10 MG tablet Take 1 tablet (10 mg total) by mouth daily. 60 tablet 0  . prochlorperazine (COMPAZINE) 10 MG tablet Take 1 tablet (10 mg total) by mouth every 6 (six) hours as needed (Nausea or vomiting). 30 tablet 1   No current facility-administered medications for this visit.    OBJECTIVE:  Spanish speaker in no acute distress  Vitals:   10/14/19 1148  BP: 112/67  Pulse: 87  Resp: 18  Temp: 98.5 F (36.9 C)  SpO2: 100%     Body mass index is 25 kg/m.   Wt Readings from Last 3 Encounters:  10/14/19 164 lb 6.4 oz (74.6 kg)  10/06/19 166 lb 4.8 oz (75.4 kg)  09/29/19 162 lb 11.2 oz (73.8 kg)      ECOG FS:1 - Symptomatic but completely ambulatory GENERAL: Patient is a well appearing female in no acute distress HEENT:  Sclerae anicteric.  Mask in place. Neck is supple.  NODES:  No cervical, supraclavicular, or axillary lymphadenopathy palpated.  BREAST EXAM:  Deferred. LUNGS:  Clear to auscultation bilaterally.  No wheezes or rhonchi. HEART:  Regular rate and rhythm. No murmur  appreciated. ABDOMEN:  Soft, nontender.  Positive, normoactive bowel sounds. No organomegaly palpated. MSK:  No focal spinal tenderness to palpation. Full range of motion bilaterally in the upper extremities. EXTREMITIES:  No peripheral edema.   SKIN:  Clear with no obvious rashes or skin changes. No nail dyscrasia. NEURO:  Nonfocal. Well oriented.  Appropriate affect.    LAB RESULTS:  CMP     Component Value Date/Time   NA 141 10/14/2019 1140   K 4.0 10/14/2019 1140   CL 106 10/14/2019 1140   CO2 26 10/14/2019 1140   GLUCOSE 133 (H) 10/14/2019 1140   BUN 9 10/14/2019 1140   CREATININE 0.61 10/14/2019 1140   CREATININE 0.45 07/21/2019 1025   CALCIUM 9.3 10/14/2019 1140   PROT 7.7 10/14/2019 1140   ALBUMIN 3.9 10/14/2019 1140   AST 83 (H) 10/14/2019 1140   AST 29 07/21/2019 1025   ALT 192 (H) 10/14/2019 1140   ALT 58 (H) 07/21/2019 1025   ALKPHOS 110 10/14/2019 1140   BILITOT 0.2 (L) 10/14/2019 1140   BILITOT 0.3 07/21/2019 1025   GFRNONAA >60 10/14/2019  1140   GFRNONAA >60 07/21/2019 1025   GFRAA >60 10/14/2019 1140   GFRAA >60 07/21/2019 1025    No results found for: TOTALPROTELP, ALBUMINELP, A1GS, A2GS, BETS, BETA2SER, GAMS, MSPIKE, SPEI  No results found for: KPAFRELGTCHN, LAMBDASER, KAPLAMBRATIO  Lab Results  Component Value Date   WBC 3.9 (L) 10/14/2019   NEUTROABS 2.2 10/14/2019   HGB 10.0 (L) 10/14/2019   HCT 29.3 (L) 10/14/2019   MCV 96.7 10/14/2019   PLT 180 10/14/2019    No results found for: LABCA2  No components found for: RCVELF810  No results for input(s): INR in the last 168 hours.  No results found for: LABCA2  No results found for: FBP102  No results found for: HEN277  No results found for: OEU235  No results found for: CA2729  No components found for: HGQUANT  No results found for: CEA1 / No results found for: CEA1   No results found for: AFPTUMOR  No results found for: CHROMOGRNA  No results found for: HGBA, HGBA2QUANT,  HGBFQUANT, HGBSQUAN (Hemoglobinopathy evaluation)   No results found for: LDH  No results found for: IRON, TIBC, IRONPCTSAT (Iron and TIBC)  No results found for: FERRITIN  Urinalysis No results found for: COLORURINE, APPEARANCEUR, LABSPEC, PHURINE, GLUCOSEU, HGBUR, BILIRUBINUR, KETONESUR, PROTEINUR, UROBILINOGEN, NITRITE, LEUKOCYTESUR   STUDIES: MR BREAST BILATERAL W WO CONTRAST INC CAD  Result Date: 09/26/2019 CLINICAL DATA:  29 year old female with known right breast cancer presenting for re-evaluation after neoadjuvant chemotherapy. LABS:  None performed on site. EXAM: BILATERAL BREAST MRI WITH AND WITHOUT CONTRAST TECHNIQUE: Multiplanar, multisequence MR images of both breasts were obtained prior to and following the intravenous administration of 10 ml of Gadavist. Three-dimensional MR images were rendered by post-processing of the original MR data on an independent workstation. The three-dimensional MR images were interpreted, and findings are reported in the following complete MRI report for this study. Three dimensional images were evaluated at the independent DynaCad workstation COMPARISON:  Previous exam(s). FINDINGS: Breast composition: d. Extreme fibroglandular tissue. Background parenchymal enhancement: Mild. Right breast: Previously described large, enhancing mass in the central right breast has markedly decreased in size. Approximately 2 cm of residual architectural distortion remain, though no significant enhancement is seen in association with it today. No additional suspicious findings in the remainder of the right breast. Left breast: No suspicious mass or abnormal enhancement. Lymph nodes: There is been interval resolution of marked right axillary lymphadenopathy as previously described. Previously described enlarged right internal mammary chain lymph nodes have also resolved. No suspicious lymphadenopathy identified today. Ancillary findings:  None. IMPRESSION: Marked interval  improvement/resolution in right breast mass and lymphadenopathy consistent with excellent treatment response. RECOMMENDATION: Per clinical treatment plan. BI-RADS CATEGORY  6: Known biopsy-proven malignancy. Electronically Signed   By: Kristopher Oppenheim M.D.   On: 09/26/2019 12:17     ELIGIBLE FOR AVAILABLE RESEARCH PROTOCOL:no  ASSESSMENT: 29 y.o. BRCA1 positive [name] woman status post right breast overlapping sites biopsy 05/04/2019 for a clinical T3 N0, stage IIIB invasive ductal carcinoma, grade 2, triple negative, with an MIB-1 of 40%.  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  The Common Hereditary Gene Panel offered by [facility] includes sequencing and/or deletion duplication testing of the following 48 genes: APC, ATM, AXIN2, BARD1, BMPR1A, BRCA1, BRCA2, BRIP1, CDH1, CDK4, CDKN2A (p14ARF), CDKN2A (p16INK4a), CHEK2, CTNNA1, DICER1, EPCAM (  Deletion/duplication testing only), GREM1 (promoter region deletion/duplication testing only), KIT, MEN1, MLH1, MSH2, MSH3, MSH6, MUTYH, NBN, NF1, NHTL1, PALB2, PDGFRA, PMS2, POLD1, POLE, PTEN, RAD50, RAD51C, RAD51D, RNF43, SDHB, SDHC, SDHD, SMAD4, SMARCA4. STK11, TP53, TSC1, TSC2, and VHL.  The following genes were evaluated for sequence changes only: SDHA and HOXB13 c.251G>A variant only. The report date is 05/24/2019.  (b) patient recommended bilateral mastectomies, due to potential barriers/risks with age and intensified screening  (2) neoadjuvant chemotherapy consisting of doxorubicin and cyclophosphamide in dose dense fashion x4 started 05/26/2019, completed 07/07/2019, followed by paclitaxel and carboplatin weekly x12 started 07/21/2019  (a) breast MRI 09/26/2019 shows a complete radiologic response  (3) definitive surgery to follow  (4) adjuvant radiation to  follow  (5) consider bilateral salpingo-oophorectomy at the end of breast cancer treatment  (a) receiving Lupron every 28 days   PLAN: Brilynn is feeling well today.  She continues on weekly Paclitaxel and carboplatin with good tolerance and no peripheral neruopathy.  She does have elevated liver enzymes.  Today, her ALT is 192 and I reviewed this with Dr. Jana Hakim who approved her today to receive treatment.    Saysha and I had a long discussion about her surgical choice of lumpectomy, her age, and her BRCA 1 positivity.  With the lumpectomy, she will keep her breasts and be at a greatly increased risk of an additional cancer.  She is very young, and she has no insurance, and cannot get medicaid.  With her BRCA 1 positivity she will need an annual mammogram and annual breast MRI.  Arnika understands her risk and the surveillance recommendations.  She would still like to pursue lumpectomy.   Barb will return next week for labs, f/u and her next treatment.  She was recommended to continue with the appropriate pandemic precautions. She knows to call for any questions that may arise between now and her next appointment.  We are happy to see her sooner if needed.   We are going to see her weekly until she completes her chemotherapy  Total encounter time 20 minutes.Wilber Bihari, NP 10/14/19 12:23 PM Medical Oncology and Hematology [facility] [place], [place] [address] Tel. [phone]    Fax. [phone]     *Total Encounter Time as defined by the Centers for Medicare and Medicaid Services includes, in addition to the face-to-face time of a patient visit (documented in the note above) non-face-to-face time: obtaining and reviewing outside history, ordering and reviewing medications, tests or procedures, care coordination (communications with other health care professionals or caregivers) and documentation in the medical record.

## 2019-10-18 ENCOUNTER — Encounter: Payer: Self-pay | Admitting: *Deleted

## 2019-10-20 ENCOUNTER — Inpatient Hospital Stay: Payer: Self-pay

## 2019-10-20 ENCOUNTER — Encounter: Payer: Self-pay | Admitting: *Deleted

## 2019-10-20 ENCOUNTER — Inpatient Hospital Stay (HOSPITAL_BASED_OUTPATIENT_CLINIC_OR_DEPARTMENT_OTHER): Payer: Self-pay | Admitting: Adult Health

## 2019-10-20 ENCOUNTER — Inpatient Hospital Stay: Payer: Self-pay | Attending: Oncology

## 2019-10-20 ENCOUNTER — Other Ambulatory Visit: Payer: Self-pay

## 2019-10-20 ENCOUNTER — Encounter: Payer: Self-pay | Admitting: Adult Health

## 2019-10-20 VITALS — BP 103/67 | HR 92 | Temp 99.8°F | Resp 18 | Ht 68.0 in | Wt 165.5 lb

## 2019-10-20 DIAGNOSIS — Z79899 Other long term (current) drug therapy: Secondary | ICD-10-CM | POA: Insufficient documentation

## 2019-10-20 DIAGNOSIS — C50811 Malignant neoplasm of overlapping sites of right female breast: Secondary | ICD-10-CM

## 2019-10-20 DIAGNOSIS — Z95828 Presence of other vascular implants and grafts: Secondary | ICD-10-CM

## 2019-10-20 DIAGNOSIS — Z01419 Encounter for gynecological examination (general) (routine) without abnormal findings: Secondary | ICD-10-CM

## 2019-10-20 DIAGNOSIS — Z1501 Genetic susceptibility to malignant neoplasm of breast: Secondary | ICD-10-CM | POA: Insufficient documentation

## 2019-10-20 DIAGNOSIS — R7989 Other specified abnormal findings of blood chemistry: Secondary | ICD-10-CM | POA: Insufficient documentation

## 2019-10-20 DIAGNOSIS — Z1502 Genetic susceptibility to malignant neoplasm of ovary: Secondary | ICD-10-CM

## 2019-10-20 DIAGNOSIS — Z452 Encounter for adjustment and management of vascular access device: Secondary | ICD-10-CM | POA: Insufficient documentation

## 2019-10-20 DIAGNOSIS — Z171 Estrogen receptor negative status [ER-]: Secondary | ICD-10-CM

## 2019-10-20 DIAGNOSIS — Z803 Family history of malignant neoplasm of breast: Secondary | ICD-10-CM | POA: Insufficient documentation

## 2019-10-20 DIAGNOSIS — C50311 Malignant neoplasm of lower-inner quadrant of right female breast: Secondary | ICD-10-CM | POA: Insufficient documentation

## 2019-10-20 DIAGNOSIS — Z3202 Encounter for pregnancy test, result negative: Secondary | ICD-10-CM | POA: Insufficient documentation

## 2019-10-20 DIAGNOSIS — Z5111 Encounter for antineoplastic chemotherapy: Secondary | ICD-10-CM | POA: Insufficient documentation

## 2019-10-20 LAB — CBC WITH DIFFERENTIAL/PLATELET
Abs Immature Granulocytes: 0.01 10*3/uL (ref 0.00–0.07)
Basophils Absolute: 0 10*3/uL (ref 0.0–0.1)
Basophils Relative: 0 %
Eosinophils Absolute: 0 10*3/uL (ref 0.0–0.5)
Eosinophils Relative: 1 %
HCT: 29.2 % — ABNORMAL LOW (ref 36.0–46.0)
Hemoglobin: 10.1 g/dL — ABNORMAL LOW (ref 12.0–15.0)
Immature Granulocytes: 0 %
Lymphocytes Relative: 30 %
Lymphs Abs: 1.2 10*3/uL (ref 0.7–4.0)
MCH: 33.2 pg (ref 26.0–34.0)
MCHC: 34.6 g/dL (ref 30.0–36.0)
MCV: 96.1 fL (ref 80.0–100.0)
Monocytes Absolute: 0.3 10*3/uL (ref 0.1–1.0)
Monocytes Relative: 6 %
Neutro Abs: 2.6 10*3/uL (ref 1.7–7.7)
Neutrophils Relative %: 63 %
Platelets: 191 10*3/uL (ref 150–400)
RBC: 3.04 MIL/uL — ABNORMAL LOW (ref 3.87–5.11)
RDW: 13.2 % (ref 11.5–15.5)
WBC: 4.1 10*3/uL (ref 4.0–10.5)
nRBC: 0 % (ref 0.0–0.2)

## 2019-10-20 LAB — PREGNANCY, URINE: Preg Test, Ur: NEGATIVE

## 2019-10-20 LAB — COMPREHENSIVE METABOLIC PANEL
ALT: 195 U/L — ABNORMAL HIGH (ref 0–44)
AST: 76 U/L — ABNORMAL HIGH (ref 15–41)
Albumin: 4 g/dL (ref 3.5–5.0)
Alkaline Phosphatase: 116 U/L (ref 38–126)
Anion gap: 11 (ref 5–15)
BUN: 10 mg/dL (ref 6–20)
CO2: 24 mmol/L (ref 22–32)
Calcium: 9.4 mg/dL (ref 8.9–10.3)
Chloride: 106 mmol/L (ref 98–111)
Creatinine, Ser: 0.62 mg/dL (ref 0.44–1.00)
GFR calc Af Amer: >60 mL/min (ref >60)
GFR calc non Af Amer: >60 mL/min (ref >60)
Glucose, Bld: 133 mg/dL — ABNORMAL HIGH (ref 70–99)
Potassium: 3.9 mmol/L (ref 3.5–5.1)
Sodium: 141 mmol/L (ref 135–145)
Total Bilirubin: 0.3 mg/dL (ref 0.3–1.2)
Total Protein: 7.8 g/dL (ref 6.5–8.1)

## 2019-10-20 MED ORDER — PALONOSETRON HCL INJECTION 0.25 MG/5ML
INTRAVENOUS | Status: AC
  Filled 2019-10-20: qty 5

## 2019-10-20 MED ORDER — SODIUM CHLORIDE 0.9 % IV SOLN
Freq: Once | INTRAVENOUS | Status: AC
  Filled 2019-10-20: qty 250

## 2019-10-20 MED ORDER — DIPHENHYDRAMINE HCL 50 MG/ML IJ SOLN
INTRAMUSCULAR | Status: AC
  Filled 2019-10-20: qty 1

## 2019-10-20 MED ORDER — DIPHENHYDRAMINE HCL 50 MG/ML IJ SOLN
25.0000 mg | Freq: Once | INTRAMUSCULAR | Status: AC
  Administered 2019-10-20: 25 mg via INTRAVENOUS

## 2019-10-20 MED ORDER — SODIUM CHLORIDE 0.9 % IV SOLN
10.0000 mg | Freq: Once | INTRAVENOUS | Status: AC
  Administered 2019-10-20: 10 mg via INTRAVENOUS
  Filled 2019-10-20: qty 10

## 2019-10-20 MED ORDER — SODIUM CHLORIDE 0.9 % IV SOLN
287.8000 mg | Freq: Once | INTRAVENOUS | Status: AC
  Administered 2019-10-20: 290 mg via INTRAVENOUS
  Filled 2019-10-20: qty 29

## 2019-10-20 MED ORDER — SODIUM CHLORIDE 0.9% FLUSH
10.0000 mL | INTRAVENOUS | Status: DC | PRN
  Administered 2019-10-20: 10 mL
  Filled 2019-10-20: qty 10

## 2019-10-20 MED ORDER — FAMOTIDINE IN NACL 20-0.9 MG/50ML-% IV SOLN
INTRAVENOUS | Status: AC
  Filled 2019-10-20: qty 50

## 2019-10-20 MED ORDER — HEPARIN SOD (PORK) LOCK FLUSH 100 UNIT/ML IV SOLN
500.0000 [IU] | Freq: Once | INTRAVENOUS | Status: AC | PRN
  Administered 2019-10-20: 500 [IU]
  Filled 2019-10-20: qty 5

## 2019-10-20 MED ORDER — SODIUM CHLORIDE 0.9% FLUSH
10.0000 mL | Freq: Once | INTRAVENOUS | Status: AC
  Administered 2019-10-20: 10 mL
  Filled 2019-10-20: qty 10

## 2019-10-20 MED ORDER — PALONOSETRON HCL INJECTION 0.25 MG/5ML
0.2500 mg | Freq: Once | INTRAVENOUS | Status: AC
  Administered 2019-10-20: 0.25 mg via INTRAVENOUS

## 2019-10-20 MED ORDER — SODIUM CHLORIDE 0.9 % IV SOLN
65.0000 mg/m2 | Freq: Once | INTRAVENOUS | Status: AC
  Administered 2019-10-20: 120 mg via INTRAVENOUS
  Filled 2019-10-20: qty 20

## 2019-10-20 MED ORDER — FAMOTIDINE IN NACL 20-0.9 MG/50ML-% IV SOLN
20.0000 mg | Freq: Once | INTRAVENOUS | Status: AC
  Administered 2019-10-20: 20 mg via INTRAVENOUS

## 2019-10-20 NOTE — Progress Notes (Signed)
[place]  Telephone:([phone]) [phone] [phone]     ID: Brianna Owens DOB: 06-03-1991  MR#: [id number]  DVV#:[id number]  Patient Care Team: Patient, [name] as PCP - General (General Practice) Magrinat, [name], MD as Consulting Physician (Oncology) Brianna Luna, MD as Consulting Physician (General Surgery) Brianna Kaufmann, RN as Oncology Nurse Navigator Brianna Germany, RN as Oncology Nurse Navigator Brianna Dock, NP OTHER MD:  CHIEF COMPLAINT: Triple negative breast cancer, BRCA1 positive  CURRENT TREATMENT: neoadjuvant chemotherapy   INTERVAL HISTORY: Brianna Owens returns today for follow up and treatment of her triple negative breast cancer.  We are accompanied by Brianna Owens our in person Spanish interpreter.     She is currently receiving paclitaxel and carboplatin. Today is the 11th of 12 planned weekly doses. She says she is tolerating this well.  She had increased ALT of 202 last week and Dr Brianna Owens wanted to give her a week off of chemotherapy.  Last week, her ALT is 192.  She is not taking tylenol or drinking etoh.    Her breast MRI was on 09/26/2019 and showed marked improvement and resolution of her right breast mass.   REVIEW OF SYSTEMS: Brianna Owens is doing well today.  She met with Dr. Brantley Owens and is planning on lumpectomy.  She is awaiting scheduling of her surgery.  She has no peripheral neruopathy.  She is going to the park sometimes throughout the week to enjoy the weather.  She denies any new issues like fever, chills, chest pain, palpitations, cough, shortness of breath, bowel/bladder changes, headaches, vision issues, nausea, vomiting, or any other concerns.  A detailed ROS was otherwise non contributory.     HISTORY OF CURRENT ILLNESS: From the original intake note:  Brianna Owens [facility] with a 3 month history of a right breast lump that became painful in  early 04/2019. Physical exam performed at that time showed a palpable, tender 13 cm lump within the right center breast under the nipple area. She underwent right breast ultrasonography at [facility] on 04/27/2019 showing: 6.1 cm palpable mass centered in the 5 o'clock position of the right breast; single right inferomedial axillary lymph node with focal cortical thickening inferiorly.  Accordingly on 05/04/2019 she proceeded to biopsy of the right breast area in question. The pathology from this procedure ([id number]) showed: invasive ductal carcinoma, grade 2-3. Prognostic indicators significant for: estrogen receptor, 0% negative and progesterone receptor, 0% negative. Proliferation marker Ki67 at 40%.  I do not find HER-2 receptor documentation  The right axillary lymph node biopsied at that time showed reactive germinal centers.  She underwent bilateral diagnostic mammography with tomography at [facility] on 05/05/2019 showing: breast density category D; biopsy-proven right breast malignancy measures 6.1 cm; no additional masses or calcifications seen in the right breast; no evidence of malignancy in the left breast.  The patient's subsequent history is as detailed below.   PAST MEDICAL HISTORY: No past medical history on file.  PAST SURGICAL HISTORY: Past Surgical History:  Procedure Laterality Date  . IR IMAGING GUIDED PORT INSERTION  05/24/2019    FAMILY HISTORY: Family History  Problem Relation Age of Onset  . Breast cancer Mother   . Breast cancer Maternal Aunt    Patient's father is 13 and her mother 60 as of November 2020.  The patient's mother was diagnosed with breast cancer in her early 55s.  She lives in [place].  The patient's mother's sister was also diagnosed with breast cancer in her early 74s.  The patient herself has 1 sister, no brothers.  She is not aware of any ovarian cancer cases in the family.   GYNECOLOGIC HISTORY:  No LMP recorded. Menarche:  29 years old Age at first live birth: 29 years old [place] P 2 LMPregular Contraceptive HRT n/a  Hysterectomy? no BSO? no   SOCIAL HISTORY: (updated 04/2019)  Brianna Owens is currently working at [employer].  She is originally from [place].  She is divorced.  Her children are Brianna Owens, 87 years old, living in [place] with his father, and Brianna Owens, 24 years old, who lives with the patient.  Also at home are her uncle Brianna Owens, his wife, and that wife's cousin.     ADVANCED DIRECTIVES: Not in place   HEALTH MAINTENANCE: Social History   Tobacco Use  . Smoking status: Never Smoker  . Smokeless tobacco: Never Used  Substance Use Topics  . Alcohol use: Not Currently  . Drug use: Not Currently     Colonoscopy: n/a  PAP: 04/26/2019, negative  Bone density: n/a   No Known Allergies  Current Outpatient Medications  Medication Sig Dispense Refill  . lidocaine-prilocaine (EMLA) cream Apply to affected area once 30 g 3  . loratadine (CLARITIN) 10 MG tablet Take 1 tablet (10 mg total) by mouth daily. 60 tablet 0  . prochlorperazine (COMPAZINE) 10 MG tablet Take 1 tablet (10 mg total) by mouth every 6 (six) hours as needed (Nausea or vomiting). 30 tablet 1   No current facility-administered medications for this visit.    OBJECTIVE:  Spanish speaker in no acute distress  Vitals:   10/20/19 1334  BP: 103/67  Pulse: 92  Resp: 18  Temp: 99.8 F (37.7 C)  SpO2: 100%     Body mass index is 25.16 kg/m.   Wt Readings from Last 3 Encounters:  10/20/19 165 lb 8 oz (75.1 kg)  10/14/19 164 lb 6.4 oz (74.6 kg)  10/06/19 166 lb 4.8 oz (75.4 kg)      ECOG FS:1 - Symptomatic but completely ambulatory GENERAL: Patient is a well appearing female in no acute distress HEENT:  Sclerae anicteric.  Mask in place. Neck is supple.  NODES:  No cervical, supraclavicular, or axillary lymphadenopathy palpated.  BREAST EXAM:  No breast cancer progression noted LUNGS:  Clear to auscultation bilaterally.  No  wheezes or rhonchi. HEART:  Regular rate and rhythm. No murmur appreciated. ABDOMEN:  Soft, nontender.  Positive, normoactive bowel sounds. No organomegaly palpated. MSK:  No focal spinal tenderness to palpation. Full range of motion bilaterally in the upper extremities. EXTREMITIES:  No peripheral edema.   SKIN:  Clear with no obvious rashes or skin changes. No nail dyscrasia. NEURO:  Nonfocal. Well oriented.  Appropriate affect.    LAB RESULTS:  CMP     Component Value Date/Time   NA 141 10/14/2019 1140   K 4.0 10/14/2019 1140   CL 106 10/14/2019 1140   CO2 26 10/14/2019 1140   GLUCOSE 133 (H) 10/14/2019 1140   BUN 9 10/14/2019 1140   CREATININE 0.61 10/14/2019 1140   CREATININE 0.45 07/21/2019 1025   CALCIUM 9.3 10/14/2019 1140   PROT 7.7 10/14/2019 1140   ALBUMIN 3.9 10/14/2019 1140   AST 83 (H) 10/14/2019 1140   AST 29 07/21/2019 1025   ALT 192 (H) 10/14/2019 1140   ALT 58 (H) 07/21/2019 1025   ALKPHOS 110 10/14/2019 1140   BILITOT  0.2 (L) 10/14/2019 1140   BILITOT 0.3 07/21/2019 1025   GFRNONAA >60 10/14/2019 1140   GFRNONAA >60 07/21/2019 1025   GFRAA >60 10/14/2019 1140   GFRAA >60 07/21/2019 1025    No results found for: TOTALPROTELP, ALBUMINELP, A1GS, A2GS, BETS, BETA2SER, GAMS, MSPIKE, SPEI  No results found for: KPAFRELGTCHN, LAMBDASER, KAPLAMBRATIO  Lab Results  Component Value Date   WBC 4.1 10/20/2019   NEUTROABS 2.6 10/20/2019   HGB 10.1 (L) 10/20/2019   HCT 29.2 (L) 10/20/2019   MCV 96.1 10/20/2019   PLT 191 10/20/2019    No results found for: LABCA2  No components found for: IBBCWU889  No results for input(s): INR in the last 168 hours.  No results found for: LABCA2  No results found for: VQX450  No results found for: TUU828  No results found for: MKL491  No results found for: CA2729  No components found for: HGQUANT  No results found for: CEA1 / No results found for: CEA1   No results found for: AFPTUMOR  No results found  for: CHROMOGRNA  No results found for: HGBA, HGBA2QUANT, HGBFQUANT, HGBSQUAN (Hemoglobinopathy evaluation)   No results found for: LDH  No results found for: IRON, TIBC, IRONPCTSAT (Iron and TIBC)  No results found for: FERRITIN  Urinalysis No results found for: COLORURINE, APPEARANCEUR, LABSPEC, PHURINE, GLUCOSEU, HGBUR, BILIRUBINUR, KETONESUR, PROTEINUR, UROBILINOGEN, NITRITE, LEUKOCYTESUR   STUDIES: MR BREAST BILATERAL W WO CONTRAST INC CAD  Result Date: 09/26/2019 CLINICAL DATA:  29 year old female with known right breast cancer presenting for re-evaluation after neoadjuvant chemotherapy. LABS:  None performed on site. EXAM: BILATERAL BREAST MRI WITH AND WITHOUT CONTRAST TECHNIQUE: Multiplanar, multisequence MR images of both breasts were obtained prior to and following the intravenous administration of 10 ml of Gadavist. Three-dimensional MR images were rendered by post-processing of the original MR data on an independent workstation. The three-dimensional MR images were interpreted, and findings are reported in the following complete MRI report for this study. Three dimensional images were evaluated at the independent DynaCad workstation COMPARISON:  Previous exam(s). FINDINGS: Breast composition: d. Extreme fibroglandular tissue. Background parenchymal enhancement: Mild. Right breast: Previously described large, enhancing mass in the central right breast has markedly decreased in size. Approximately 2 cm of residual architectural distortion remain, though no significant enhancement is seen in association with it today. No additional suspicious findings in the remainder of the right breast. Left breast: No suspicious mass or abnormal enhancement. Lymph nodes: There is been interval resolution of marked right axillary lymphadenopathy as previously described. Previously described enlarged right internal mammary chain lymph nodes have also resolved. No suspicious lymphadenopathy identified  today. Ancillary findings:  None. IMPRESSION: Marked interval improvement/resolution in right breast mass and lymphadenopathy consistent with excellent treatment response. RECOMMENDATION: Per clinical treatment plan. BI-RADS CATEGORY  6: Known biopsy-proven malignancy. Electronically Signed   By: Kristopher Oppenheim M.D.   On: 09/26/2019 12:17     ELIGIBLE FOR AVAILABLE RESEARCH PROTOCOL:no  ASSESSMENT: 29 y.o. BRCA1 positive Brianna Owens woman status post right breast overlapping sites biopsy 05/04/2019 for a clinical T3 N0, Owens IIIB invasive ductal carcinoma, grade 2, triple negative, with an MIB-1 of 40%.  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  The Common Hereditary Gene Panel offered by [facility] includes sequencing and/or deletion duplication testing of the following 48 genes: APC, ATM, AXIN2, BARD1,  BMPR1A, BRCA1, BRCA2, BRIP1, CDH1, CDK4, CDKN2A (p14ARF), CDKN2A (p16INK4a), CHEK2, CTNNA1, DICER1, EPCAM (Deletion/duplication testing only), GREM1 (promoter region deletion/duplication testing only), KIT, MEN1, MLH1, MSH2, MSH3, MSH6, MUTYH, NBN, NF1, NHTL1, PALB2, PDGFRA, PMS2, POLD1, POLE, PTEN, RAD50, RAD51C, RAD51D, RNF43, SDHB, SDHC, SDHD, SMAD4, SMARCA4. STK11, TP53, TSC1, TSC2, and VHL.  The following genes were evaluated for sequence changes only: SDHA and HOXB13 c.251G>A variant only. The report date is 05/24/2019.  (b) patient recommended bilateral mastectomies, due to potential barriers/risks with age and intensified screening  (2) neoadjuvant chemotherapy consisting of doxorubicin and cyclophosphamide in dose dense fashion x4 started 05/26/2019, completed 07/07/2019, followed by paclitaxel and carboplatin weekly x12 started 07/21/2019  (a) breast MRI 09/26/2019 shows a complete radiologic response  (3)  definitive surgery to follow  (4) adjuvant radiation to follow  (5) consider bilateral salpingo-oophorectomy at the end of breast cancer treatment  (a) receiving Lupron every 28 days   PLAN: Brianna Owens is feeling well today.  Her labs (AST and ALT) are stable this week as compared to last week. I reviewed them with Dr. Jana Owens who has cleared her to continue with her treatment with Paclitaxel and carboplatin.   We are awaiting her surgical scheduling.  She will undergo surgery a few weeks after her chemotherapy is completed.  We briefly discussed healthy diet and exercise.     Brianna Owens will return next week for labs, f/u and her next treatment.  She was recommended to continue with the appropriate pandemic precautions. She knows to call for any questions that may arise between now and her next appointment.  We are happy to see her sooner if needed.   Total encounter time 20 minutes.Wilber Bihari, NP 10/20/19 1:58 PM Medical Oncology and Hematology [facility] [place], [place] [address] Tel. [phone]    Fax. [phone]     *Total Encounter Time as defined by the Centers for Medicare and Medicaid Services includes, in addition to the face-to-face time of a patient visit (documented in the note above) non-face-to-face time: obtaining and reviewing outside history, ordering and reviewing medications, tests or procedures, care coordination (communications with other health care professionals or caregivers) and documentation in the medical record.

## 2019-10-20 NOTE — Patient Instructions (Signed)

## 2019-10-20 NOTE — Patient Instructions (Signed)
[facility] Discharge Instructions for Patients Receiving Chemotherapy  Today you received the following chemotherapy agent: Paclitaxel, and Carboplatin  To help prevent nausea and vomiting after your treatment, we encourage you to take your nausea medication as directed by the MD.   If you develop nausea and vomiting that is not controlled by your nausea medication, call the clinic.   BELOW ARE SYMPTOMS THAT SHOULD BE REPORTED IMMEDIATELY:  *FEVER GREATER THAN 100.5 F  *CHILLS WITH OR WITHOUT FEVER  NAUSEA AND VOMITING THAT IS NOT CONTROLLED WITH YOUR NAUSEA MEDICATION  *UNUSUAL SHORTNESS OF BREATH  *UNUSUAL BRUISING OR BLEEDING  TENDERNESS IN MOUTH AND THROAT WITH OR WITHOUT PRESENCE OF ULCERS  *URINARY PROBLEMS  *BOWEL PROBLEMS  UNUSUAL RASH Items with * indicate a potential emergency and should be followed up as soon as possible.  Feel free to call the clinic should you have any questions or concerns. The clinic phone number is [phone].  Please show the [facility] at check-in to the Emergency Department and triage nurse.  Coronavirus (COVID-19) Are you at risk?  Are you at risk for the Coronavirus (COVID-19)?  To be considered HIGH RISK for Coronavirus (COVID-19), you have to meet the following criteria:  . Traveled to [place], [place], [place], [place] or [place]; or in the [place] to [place], [place], [place], or [place]; and have fever, cough, and shortness of breath within the last 2 weeks of travel OR . Been in close contact with a person diagnosed with COVID-19 within the last 2 weeks and have fever, cough, and shortness of breath . IF YOU DO NOT MEET THESE CRITERIA, YOU ARE CONSIDERED LOW RISK FOR COVID-19.  What to do if you are HIGH RISK for COVID-19?  Marland Kitchen If you are having a medical emergency, call 911. . Seek medical care right away. Before you go to a doctor's office, urgent care or emergency department, call ahead  and tell them about your recent travel, contact with someone diagnosed with COVID-19, and your symptoms. You should receive instructions from your physician's office regarding next steps of care.  . When you arrive at healthcare provider, tell the healthcare staff immediately you have returned from visiting [place], [place], [place], [place] or [place]; or traveled in the [place] to [place], [place], [place], or [place]; in the last two weeks or you have been in close contact with a person diagnosed with COVID-19 in the last 2 weeks.   . Tell the health care staff about your symptoms: fever, cough and shortness of breath. . After you have been seen by a medical provider, you will be either: o Tested for (COVID-19) and discharged home on quarantine except to seek medical care if symptoms worsen, and asked to  - Stay home and avoid contact with others until you get your results (4-5 days)  - Avoid travel on public transportation if possible (such as bus, train, or airplane) or o Sent to the Emergency Department by EMS for evaluation, COVID-19 testing, and possible admission depending on your condition and test results.  What to do if you are LOW RISK for COVID-19?  Reduce your risk of any infection by using the same precautions used for avoiding the common cold or flu:  Marland Kitchen Wash your hands often with soap and warm water for at least 20 seconds.  If soap and water are not readily available, use an alcohol-based hand sanitizer with at least 60% alcohol.  Marland Kitchen  If coughing or sneezing, cover your mouth and nose by coughing or sneezing into the elbow areas of your shirt or coat, into a tissue or into your sleeve (not your hands). . Avoid shaking hands with others and consider head nods or verbal greetings only. . Avoid touching your eyes, nose, or mouth with unwashed hands.  . Avoid close contact with people who are sick. . Avoid places or events with large numbers of people in one location, like  concerts or sporting events. . Carefully consider travel plans you have or are making. . If you are planning any travel outside or inside the [place], visit the CDC's Travelers' Health webpage for the latest health notices. . If you have some symptoms but not all symptoms, continue to monitor at home and seek medical attention if your symptoms worsen. . If you are having a medical emergency, call 911.   ADDITIONAL HEALTHCARE OPTIONS FOR PATIENTS  [facility] / e-Visit: [email or web address]         [facility]: [phone]  [facility]: [phone]                   [facility]: [phone]  

## 2019-10-24 ENCOUNTER — Telehealth: Payer: Self-pay | Admitting: Oncology

## 2019-10-24 ENCOUNTER — Encounter: Payer: Self-pay | Admitting: *Deleted

## 2019-10-24 NOTE — Telephone Encounter (Signed)
Scheduled appts per 5/6 los. Representative from [employer] interpreters left voicemail with appt date and time.

## 2019-10-25 MED FILL — Dexamethasone Sodium Phosphate Inj 100 MG/10ML: INTRAMUSCULAR | Qty: 1 | Status: AC

## 2019-10-26 ENCOUNTER — Inpatient Hospital Stay (HOSPITAL_BASED_OUTPATIENT_CLINIC_OR_DEPARTMENT_OTHER): Payer: Self-pay | Admitting: Adult Health

## 2019-10-26 ENCOUNTER — Inpatient Hospital Stay: Payer: Self-pay

## 2019-10-26 ENCOUNTER — Other Ambulatory Visit: Payer: Self-pay

## 2019-10-26 ENCOUNTER — Encounter: Payer: Self-pay | Admitting: Adult Health

## 2019-10-26 ENCOUNTER — Encounter: Payer: Self-pay | Admitting: *Deleted

## 2019-10-26 VITALS — BP 114/79 | HR 97 | Temp 98.9°F | Resp 18 | Ht 68.0 in | Wt 168.3 lb

## 2019-10-26 DIAGNOSIS — Z01419 Encounter for gynecological examination (general) (routine) without abnormal findings: Secondary | ICD-10-CM

## 2019-10-26 DIAGNOSIS — Z1502 Genetic susceptibility to malignant neoplasm of ovary: Secondary | ICD-10-CM

## 2019-10-26 DIAGNOSIS — Z95828 Presence of other vascular implants and grafts: Secondary | ICD-10-CM

## 2019-10-26 DIAGNOSIS — C50811 Malignant neoplasm of overlapping sites of right female breast: Secondary | ICD-10-CM

## 2019-10-26 DIAGNOSIS — Z1501 Genetic susceptibility to malignant neoplasm of breast: Secondary | ICD-10-CM

## 2019-10-26 DIAGNOSIS — Z171 Estrogen receptor negative status [ER-]: Secondary | ICD-10-CM

## 2019-10-26 LAB — CBC WITH DIFFERENTIAL/PLATELET
Abs Immature Granulocytes: 0 10*3/uL (ref 0.00–0.07)
Basophils Absolute: 0 10*3/uL (ref 0.0–0.1)
Basophils Relative: 0 %
Eosinophils Absolute: 0 10*3/uL (ref 0.0–0.5)
Eosinophils Relative: 1 %
HCT: 27.2 % — ABNORMAL LOW (ref 36.0–46.0)
Hemoglobin: 9.4 g/dL — ABNORMAL LOW (ref 12.0–15.0)
Immature Granulocytes: 0 %
Lymphocytes Relative: 34 %
Lymphs Abs: 1 10*3/uL (ref 0.7–4.0)
MCH: 33.7 pg (ref 26.0–34.0)
MCHC: 34.6 g/dL (ref 30.0–36.0)
MCV: 97.5 fL (ref 80.0–100.0)
Monocytes Absolute: 0.2 10*3/uL (ref 0.1–1.0)
Monocytes Relative: 5 %
Neutro Abs: 1.8 10*3/uL (ref 1.7–7.7)
Neutrophils Relative %: 60 %
Platelets: 196 10*3/uL (ref 150–400)
RBC: 2.79 MIL/uL — ABNORMAL LOW (ref 3.87–5.11)
RDW: 13.1 % (ref 11.5–15.5)
WBC: 3 10*3/uL — ABNORMAL LOW (ref 4.0–10.5)
nRBC: 0 % (ref 0.0–0.2)

## 2019-10-26 LAB — COMPREHENSIVE METABOLIC PANEL
ALT: 254 U/L — ABNORMAL HIGH (ref 0–44)
AST: 100 U/L — ABNORMAL HIGH (ref 15–41)
Albumin: 3.9 g/dL (ref 3.5–5.0)
Alkaline Phosphatase: 118 U/L (ref 38–126)
Anion gap: 8 (ref 5–15)
BUN: 11 mg/dL (ref 6–20)
CO2: 25 mmol/L (ref 22–32)
Calcium: 9.4 mg/dL (ref 8.9–10.3)
Chloride: 107 mmol/L (ref 98–111)
Creatinine, Ser: 0.6 mg/dL (ref 0.44–1.00)
GFR calc Af Amer: >60 mL/min (ref >60)
GFR calc non Af Amer: >60 mL/min (ref >60)
Glucose, Bld: 110 mg/dL — ABNORMAL HIGH (ref 70–99)
Potassium: 4 mmol/L (ref 3.5–5.1)
Sodium: 140 mmol/L (ref 135–145)
Total Bilirubin: <0.2 mg/dL — ABNORMAL LOW (ref 0.3–1.2)
Total Protein: 7.5 g/dL (ref 6.5–8.1)

## 2019-10-26 LAB — PREGNANCY, URINE: Preg Test, Ur: NEGATIVE

## 2019-10-26 MED ORDER — DIPHENHYDRAMINE HCL 50 MG/ML IJ SOLN
INTRAMUSCULAR | Status: AC
  Filled 2019-10-26: qty 1

## 2019-10-26 MED ORDER — PALONOSETRON HCL INJECTION 0.25 MG/5ML
INTRAVENOUS | Status: AC
  Filled 2019-10-26: qty 5

## 2019-10-26 MED ORDER — HEPARIN SOD (PORK) LOCK FLUSH 100 UNIT/ML IV SOLN
500.0000 [IU] | Freq: Once | INTRAVENOUS | Status: AC
  Administered 2019-10-26: 500 [IU]
  Filled 2019-10-26: qty 5

## 2019-10-26 MED ORDER — FAMOTIDINE IN NACL 20-0.9 MG/50ML-% IV SOLN
INTRAVENOUS | Status: AC
  Filled 2019-10-26: qty 50

## 2019-10-26 MED ORDER — SODIUM CHLORIDE 0.9% FLUSH
10.0000 mL | Freq: Once | INTRAVENOUS | Status: AC
  Administered 2019-10-26: 10 mL
  Filled 2019-10-26: qty 10

## 2019-10-26 NOTE — Progress Notes (Signed)
Brianna Owens  [phone] [phone]     ID: Brianna Owens DOB: 08-07-1990  MR#: [id number]  PYP#:[id number]  Patient Care Team: Patient, [name] as PCP - General (General Practice) Magrinat, [name], MD as Consulting Physician (Oncology) Erroll Luna, MD as Consulting Physician (General Surgery) [name], RN as Oncology Nurse Navigator [name], RN as Oncology Nurse Navigator Scot Dock, NP OTHER MD:  CHIEF COMPLAINT: Triple negative breast cancer, BRCA1 positive  CURRENT TREATMENT: neoadjuvant chemotherapy   INTERVAL HISTORY: [name] returns today for follow up and treatment of her triple negative breast cancer.  We are accompanied by [name] our in person Spanish interpreter.     She is currently receiving paclitaxel and carboplatin. Today is the 12th of 12 planned weekly doses. She says she is tolerating this well.  She has had issues with elevated LFTs along with her chemotherapy.  Her labs are not yet back today.  She saw Dr. Brantley Stage on 10/10/2019 and he plans on lumpectomy with sentinel node biopsy.  Surgery has not been scheduled yet.    Her breast MRI was on 09/26/2019 and showed marked improvement and resolution of her right breast mass.   REVIEW OF SYSTEMS: Brianna Owens continues to feel well.  She has no new issues.  She is without fever, chills, chest pain, palpitations, cough, shortness of breath, bowel/bladder changes, peripheral neuropathy, nausea, vomiting, fatigue, decreased appetite.  She continues to walk daily.  A detailed ROS was otherwise non contributory.     HISTORY OF CURRENT ILLNESS: From the original intake note:  Packwood [facility] with a 3 month history of a right breast lump that became painful in early 04/2019. Physical exam performed at that time showed a palpable, tender 13 cm lump within the right center  breast under the nipple area. She underwent right breast ultrasonography at [facility] on 04/27/2019 showing: 6.1 cm palpable mass centered in the 5 o'clock position of the right breast; single right inferomedial axillary lymph node with focal cortical thickening inferiorly.  Accordingly on 05/04/2019 she proceeded to biopsy of the right breast area in question. The pathology from this procedure ([id number]) showed: invasive ductal carcinoma, grade 2-3. Prognostic indicators significant for: estrogen receptor, 0% negative and progesterone receptor, 0% negative. Proliferation marker Ki67 at 40%.  I do not find HER-2 receptor documentation  The right axillary lymph node biopsied at that time showed reactive germinal centers.  She underwent bilateral diagnostic mammography with tomography at [facility] on 05/05/2019 showing: breast density category D; biopsy-proven right breast malignancy measures 6.1 cm; no additional masses or calcifications seen in the right breast; no evidence of malignancy in the left breast.  The patient's subsequent history is as detailed below.   PAST MEDICAL HISTORY: No past medical history on file.  PAST SURGICAL HISTORY: Past Surgical History:  Procedure Laterality Date  . IR IMAGING GUIDED PORT INSERTION  05/24/2019    FAMILY HISTORY: Family History  Problem Relation Age of Onset  . Breast cancer Mother   . Breast cancer Maternal Aunt    Patient's father is 32 and her mother 57 as of November 2020.  The patient's mother was diagnosed with breast cancer in her early 89s.  She lives in [place]. The patient's mother's sister was also diagnosed with breast cancer in her early 29s.  The patient herself has 1 sister, no brothers.  She is not  aware of any ovarian cancer cases in the family.   GYNECOLOGIC HISTORY:  No LMP recorded. Menarche: 29 years old Age at first live birth: 29 years old [place] P 2 LMPregular Contraceptive HRT n/a  Hysterectomy?  no BSO? no   SOCIAL HISTORY: (updated 04/2019)  Siddalee is currently working at [employer].  She is originally from [place].  She is divorced.  Her children are [name], 69 years old, living in [place] with his father, and [name], 71 years old, who lives with the patient.  Also at home are her uncle [name], his wife, and that wife's cousin.     ADVANCED DIRECTIVES: Not in place   HEALTH MAINTENANCE: Social History   Tobacco Use  . Smoking status: Never Smoker  . Smokeless tobacco: Never Used  Substance Use Topics  . Alcohol use: Not Currently  . Drug use: Not Currently     Colonoscopy: n/a  PAP: 04/26/2019, negative  Bone density: n/a   No Known Allergies  Current Outpatient Medications  Medication Sig Dispense Refill  . lidocaine-prilocaine (EMLA) cream Apply to affected area once 30 g 3  . loratadine (CLARITIN) 10 MG tablet Take 1 tablet (10 mg total) by mouth daily. 60 tablet 0  . prochlorperazine (COMPAZINE) 10 MG tablet Take 1 tablet (10 mg total) by mouth every 6 (six) hours as needed (Nausea or vomiting). 30 tablet 1   No current facility-administered medications for this visit.    OBJECTIVE:  Spanish speaker in no acute distress  Vitals:   10/26/19 1132  BP: 114/79  Pulse: 97  Resp: 18  Temp: 98.9 F (37.2 C)  SpO2: 100%     Body mass index is 25.59 kg/m.   Wt Readings from Last 3 Encounters:  10/26/19 168 lb 4.8 oz (76.3 kg)  10/20/19 165 lb 8 oz (75.1 kg)  10/14/19 164 lb 6.4 oz (74.6 kg)      ECOG FS:1 - Symptomatic but completely ambulatory GENERAL: Patient is a well appearing female in no acute distress HEENT:  Sclerae anicteric.  Mask in place. Neck is supple.  NODES:  No cervical, supraclavicular, or axillary lymphadenopathy palpated.  BREAST EXAM:  No breast cancer progression noted LUNGS:  Clear to auscultation bilaterally.  No wheezes or rhonchi. HEART:  Regular rate and rhythm. No murmur appreciated. ABDOMEN:  Soft, nontender.   Positive, normoactive bowel sounds. No organomegaly palpated. MSK:  No focal spinal tenderness to palpation. Full range of motion bilaterally in the upper extremities. EXTREMITIES:  No peripheral edema.   SKIN:  Clear with no obvious rashes or skin changes. No nail dyscrasia. NEURO:  Nonfocal. Well oriented.  Appropriate affect.    LAB RESULTS:  CMP     Component Value Date/Time   NA 141 10/20/2019 1307   K 3.9 10/20/2019 1307   CL 106 10/20/2019 1307   CO2 24 10/20/2019 1307   GLUCOSE 133 (H) 10/20/2019 1307   BUN 10 10/20/2019 1307   CREATININE 0.62 10/20/2019 1307   CREATININE 0.45 07/21/2019 1025   CALCIUM 9.4 10/20/2019 1307   PROT 7.8 10/20/2019 1307   ALBUMIN 4.0 10/20/2019 1307   AST 76 (H) 10/20/2019 1307   AST 29 07/21/2019 1025   ALT 195 (H) 10/20/2019 1307   ALT 58 (H) 07/21/2019 1025   ALKPHOS 116 10/20/2019 1307   BILITOT 0.3 10/20/2019 1307   BILITOT 0.3 07/21/2019 1025   GFRNONAA >60 10/20/2019 1307   GFRNONAA >60 07/21/2019 1025   GFRAA >60 10/20/2019 1307  GFRAA >60 07/21/2019 1025    No results found for: TOTALPROTELP, ALBUMINELP, A1GS, A2GS, BETS, BETA2SER, GAMS, MSPIKE, SPEI  No results found for: KPAFRELGTCHN, LAMBDASER, KAPLAMBRATIO  Lab Results  Component Value Date   WBC 4.1 10/20/2019   NEUTROABS 2.6 10/20/2019   HGB 10.1 (L) 10/20/2019   HCT 29.2 (L) 10/20/2019   MCV 96.1 10/20/2019   PLT 191 10/20/2019    No results found for: LABCA2  No components found for: [id number]  No results for input(s): INR in the last 168 hours.  No results found for: LABCA2  No results found for: HYW737  No results found for: TGG269  No results found for: SWN462  No results found for: CA2729  No components found for: HGQUANT  No results found for: CEA1 / No results found for: CEA1   No results found for: AFPTUMOR  No results found for: CHROMOGRNA  No results found for: HGBA, HGBA2QUANT, HGBFQUANT, HGBSQUAN (Hemoglobinopathy evaluation)     No results found for: LDH  No results found for: IRON, TIBC, IRONPCTSAT (Iron and TIBC)  No results found for: FERRITIN  Urinalysis No results found for: COLORURINE, APPEARANCEUR, LABSPEC, PHURINE, GLUCOSEU, HGBUR, BILIRUBINUR, KETONESUR, PROTEINUR, UROBILINOGEN, NITRITE, LEUKOCYTESUR   STUDIES: MR BREAST BILATERAL W WO CONTRAST INC CAD  Result Date: 09/26/2019 CLINICAL DATA:  29 year old female with known right breast cancer presenting for re-evaluation after neoadjuvant chemotherapy. LABS:  None performed on site. EXAM: BILATERAL BREAST MRI WITH AND WITHOUT CONTRAST TECHNIQUE: Multiplanar, multisequence MR images of both breasts were obtained prior to and following the intravenous administration of 10 ml of Gadavist. Three-dimensional MR images were rendered by post-processing of the original MR data on an independent workstation. The three-dimensional MR images were interpreted, and findings are reported in the following complete MRI report for this study. Three dimensional images were evaluated at the independent DynaCad workstation COMPARISON:  Previous exam(s). FINDINGS: Breast composition: d. Extreme fibroglandular tissue. Background parenchymal enhancement: Mild. Right breast: Previously described large, enhancing mass in the central right breast has markedly decreased in size. Approximately 2 cm of residual architectural distortion remain, though no significant enhancement is seen in association with it today. No additional suspicious findings in the remainder of the right breast. Left breast: No suspicious mass or abnormal enhancement. Lymph nodes: There is been interval resolution of marked right axillary lymphadenopathy as previously described. Previously described enlarged right internal mammary chain lymph nodes have also resolved. No suspicious lymphadenopathy identified today. Ancillary findings:  None. IMPRESSION: Marked interval improvement/resolution in right breast mass and  lymphadenopathy consistent with excellent treatment response. RECOMMENDATION: Per clinical treatment plan. BI-RADS CATEGORY  6: Known biopsy-proven malignancy. Electronically Signed   By: Kristopher Oppenheim M.D.   On: 09/26/2019 12:17     ELIGIBLE FOR AVAILABLE RESEARCH PROTOCOL:no  ASSESSMENT: 29 y.o. BRCA1 positive [name] woman status post right breast overlapping sites biopsy 05/04/2019 for a clinical T3 N0, stage IIIB invasive ductal carcinoma, grade 2, triple negative, with an MIB-1 of 40%.  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  The Common Hereditary Gene Panel offered by [facility] includes sequencing and/or deletion duplication testing of the following 48 genes: APC, ATM, AXIN2, BARD1, BMPR1A, BRCA1, BRCA2, BRIP1, CDH1, CDK4, CDKN2A (p14ARF), CDKN2A (p16INK4a), CHEK2, CTNNA1, DICER1, EPCAM (Deletion/duplication testing only), GREM1 (promoter region deletion/duplication testing only), KIT, MEN1, MLH1, MSH2, MSH3, MSH6,  MUTYH, NBN, NF1, NHTL1, PALB2, PDGFRA, PMS2, POLD1, POLE, PTEN, RAD50, RAD51C, RAD51D, RNF43, SDHB, SDHC, SDHD, SMAD4, SMARCA4. STK11, TP53, TSC1, TSC2, and VHL.  The following genes were evaluated for sequence changes only: SDHA and HOXB13 c.251G>A variant only. The report date is 05/24/2019.  (b) patient recommended bilateral mastectomies, due to potential barriers/risks with age and intensified screening  (2) neoadjuvant chemotherapy consisting of doxorubicin and cyclophosphamide in dose dense fashion x4 started 05/26/2019, completed 07/07/2019, followed by paclitaxel and carboplatin weekly x12 started 07/21/2019  (a) breast MRI 09/26/2019 shows a complete radiologic response  (3) definitive surgery to follow  (4) adjuvant radiation to follow  (5) consider bilateral  salpingo-oophorectomy at the end of breast cancer treatment  (a) receiving Lupron every 28 days   PLAN: Jennilee is doing well today.  Her CMP has not yet resulted.  Depending on those results will determine whether or not she will be able to receive chemotherapy.  I am also reaching out to her navigator and surgical team to see where we are in getting her surgery scheduled.    I sent in a scheduling request for injections every 4 weeks.  She will see Dr. Jana Hakim again on 11/24/2019.    She was recommended to continue with the appropriate pandemic precautions. She knows to call for any questions that may arise between now and her next appointment.  We are happy to see her sooner if needed.   Total encounter time 20 minutes.Wilber Bihari, NP 10/26/19 11:40 AM Medical Oncology and Hematology [facility] [place], [name] [address] Tel. [phone]    Fax. [phone]     *Total Encounter Time as defined by the Centers for Medicare and Medicaid Services includes, in addition to the face-to-face time of a patient visit (documented in the note above) non-face-to-face time: obtaining and reviewing outside history, ordering and reviewing medications, tests or procedures, care coordination (communications with other health care professionals or caregivers) and documentation in the medical record.

## 2019-10-26 NOTE — Patient Instructions (Addendum)

## 2019-10-26 NOTE — Progress Notes (Signed)
Pt. AST 100, ALT 254, Per Wilber Bihari NP no treatment today. Pt. instructed to come back for appointment scheduled in 11/24/19 and per Mendel Ryder they will call to follow up about surgery.

## 2019-10-27 ENCOUNTER — Telehealth: Payer: Self-pay | Admitting: Adult Health

## 2019-10-27 ENCOUNTER — Other Ambulatory Visit: Payer: Self-pay | Admitting: Surgery

## 2019-10-27 DIAGNOSIS — C50911 Malignant neoplasm of unspecified site of right female breast: Secondary | ICD-10-CM

## 2019-10-27 NOTE — Telephone Encounter (Signed)
Scheduled appts per 5/12 los. Interpreter from [facility] interpreters relayed next appt date and time which was confirmed by the pt. Pt request an appt calendar at next visit which was documented in 5/27 appt notes.

## 2019-10-28 ENCOUNTER — Telehealth: Payer: Self-pay | Admitting: Adult Health

## 2019-10-28 NOTE — Progress Notes (Signed)
Pharmacist Chemotherapy Monitoring - Follow Up Assessment    I verify that I have reviewed each item in the below checklist:  . Regimen for the patient is scheduled for the appropriate day and plan matches scheduled date. Marland Kitchen Appropriate non-routine labs are ordered dependent on drug ordered. . If applicable, additional medications reviewed and ordered per protocol based on lifetime cumulative doses and/or treatment regimen.   Plan for follow-up and/or issues identified: Yes . I-vent associated with next due treatment: Yes . MD and/or nursing notified: Yes   Kennith Center, Pharm.D., CPP 10/28/2019@1 :13 PM

## 2019-10-28 NOTE — Telephone Encounter (Signed)
InterpreterMalva Cogan [phone]   called pt per 5/12 sch message - patient is aware of appt date and time.

## 2019-10-31 ENCOUNTER — Encounter: Payer: Self-pay | Admitting: *Deleted

## 2019-11-03 ENCOUNTER — Inpatient Hospital Stay: Payer: Self-pay

## 2019-11-03 ENCOUNTER — Other Ambulatory Visit: Payer: Self-pay

## 2019-11-03 ENCOUNTER — Encounter: Payer: Self-pay | Admitting: *Deleted

## 2019-11-03 ENCOUNTER — Inpatient Hospital Stay (HOSPITAL_BASED_OUTPATIENT_CLINIC_OR_DEPARTMENT_OTHER): Payer: Self-pay | Admitting: Adult Health

## 2019-11-03 ENCOUNTER — Encounter: Payer: Self-pay | Admitting: Adult Health

## 2019-11-03 VITALS — BP 118/73 | HR 94 | Temp 98.3°F | Resp 18 | Ht 68.0 in | Wt 167.0 lb

## 2019-11-03 DIAGNOSIS — Z95828 Presence of other vascular implants and grafts: Secondary | ICD-10-CM

## 2019-11-03 DIAGNOSIS — Z01419 Encounter for gynecological examination (general) (routine) without abnormal findings: Secondary | ICD-10-CM

## 2019-11-03 DIAGNOSIS — C50811 Malignant neoplasm of overlapping sites of right female breast: Secondary | ICD-10-CM

## 2019-11-03 DIAGNOSIS — Z171 Estrogen receptor negative status [ER-]: Secondary | ICD-10-CM

## 2019-11-03 LAB — COMPREHENSIVE METABOLIC PANEL
ALT: 265 U/L — ABNORMAL HIGH (ref 0–44)
AST: 110 U/L — ABNORMAL HIGH (ref 15–41)
Albumin: 3.9 g/dL (ref 3.5–5.0)
Alkaline Phosphatase: 102 U/L (ref 38–126)
Anion gap: 9 (ref 5–15)
BUN: 11 mg/dL (ref 6–20)
CO2: 25 mmol/L (ref 22–32)
Calcium: 9.2 mg/dL (ref 8.9–10.3)
Chloride: 107 mmol/L (ref 98–111)
Creatinine, Ser: 0.61 mg/dL (ref 0.44–1.00)
GFR calc Af Amer: >60 mL/min (ref >60)
GFR calc non Af Amer: >60 mL/min (ref >60)
Glucose, Bld: 134 mg/dL — ABNORMAL HIGH (ref 70–99)
Potassium: 3.6 mmol/L (ref 3.5–5.1)
Sodium: 141 mmol/L (ref 135–145)
Total Bilirubin: 0.3 mg/dL (ref 0.3–1.2)
Total Protein: 7.6 g/dL (ref 6.5–8.1)

## 2019-11-03 LAB — CBC WITH DIFFERENTIAL/PLATELET
Abs Immature Granulocytes: 0.01 10*3/uL (ref 0.00–0.07)
Basophils Absolute: 0 10*3/uL (ref 0.0–0.1)
Basophils Relative: 0 %
Eosinophils Absolute: 0 10*3/uL (ref 0.0–0.5)
Eosinophils Relative: 1 %
HCT: 29 % — ABNORMAL LOW (ref 36.0–46.0)
Hemoglobin: 9.7 g/dL — ABNORMAL LOW (ref 12.0–15.0)
Immature Granulocytes: 0 %
Lymphocytes Relative: 38 %
Lymphs Abs: 1.1 10*3/uL (ref 0.7–4.0)
MCH: 32.7 pg (ref 26.0–34.0)
MCHC: 33.4 g/dL (ref 30.0–36.0)
MCV: 97.6 fL (ref 80.0–100.0)
Monocytes Absolute: 0.4 10*3/uL (ref 0.1–1.0)
Monocytes Relative: 15 %
Neutro Abs: 1.3 10*3/uL — ABNORMAL LOW (ref 1.7–7.7)
Neutrophils Relative %: 46 %
Platelets: 251 10*3/uL (ref 150–400)
RBC: 2.97 MIL/uL — ABNORMAL LOW (ref 3.87–5.11)
RDW: 14.2 % (ref 11.5–15.5)
WBC: 2.8 10*3/uL — ABNORMAL LOW (ref 4.0–10.5)
nRBC: 0 % (ref 0.0–0.2)

## 2019-11-03 MED ORDER — SODIUM CHLORIDE 0.9% FLUSH
10.0000 mL | Freq: Once | INTRAVENOUS | Status: AC
  Administered 2019-11-03: 10 mL
  Filled 2019-11-03: qty 10

## 2019-11-03 MED ORDER — HEPARIN SOD (PORK) LOCK FLUSH 100 UNIT/ML IV SOLN
500.0000 [IU] | Freq: Once | INTRAVENOUS | Status: AC
  Administered 2019-11-03: 500 [IU]
  Filled 2019-11-03: qty 5

## 2019-11-04 ENCOUNTER — Telehealth: Payer: Self-pay | Admitting: Adult Health

## 2019-11-04 NOTE — Telephone Encounter (Signed)
No 5/20 LOS. No changes made to pt's schdedule.

## 2019-11-07 NOTE — Progress Notes (Addendum)
Brianna Owens  [phone] [phone]     ID: Brianna Owens DOB: 1991-02-08  MR#: [id number]  YQI#:[id number]  Patient Care Team: Patient, [name] as PCP - General (General Practice) Magrinat, Brianna Dad, MD as Consulting Physician (Oncology) Brianna Luna, MD as Consulting Physician (General Surgery) Brianna Kaufmann, RN as Oncology Nurse Navigator Brianna Germany, RN as Oncology Nurse Navigator Brianna Dock, NP OTHER MD:  CHIEF COMPLAINT: Triple negative breast cancer, BRCA1 positive  CURRENT TREATMENT: neoadjuvant chemotherapy   INTERVAL HISTORY: Brianna Owens returns today for follow up and treatment of her triple negative breast cancer.  We are accompanied by Brianna Owens our in person Spanish interpreter.     She is currently receiving paclitaxel and carboplatin. Today is the 12th of 12 planned weekly doses. She says she is tolerating this well.  She has had issues with elevated LFTs along with her chemotherapy.  Her LFTs have been persistently elevated.  She has a lumpectomy scheduled to be completed by Dr. Brantley Owens on 11/16/2019.    Her breast MRI was on 09/26/2019 and showed marked improvement and resolution of her right breast mass.   REVIEW OF SYSTEMS: Brianna Owens denies any new issues.  She has been feeling well and has no hot flashes, peripheral neuropathy, bowel/bladder changes, headaches, vomiting, nausea, mucositis, chest pain, cough, shortness of breath, or palpitations.  A detailed ROS was otherwise non contributory.    HISTORY OF CURRENT ILLNESS: From the original intake note:  Brianna Owens [facility] with a 3 month history of a right breast lump that became painful in early 04/2019. Physical exam performed at that time showed a palpable, tender 13 cm lump within the right center breast under the nipple area. She underwent right breast ultrasonography at [facility] on 04/27/2019 showing: 6.1 cm palpable mass centered in the 5 o'clock position of the right breast; single right inferomedial axillary lymph node with focal cortical thickening inferiorly.  Accordingly on 05/04/2019 she proceeded to biopsy of the right breast area in question. The pathology from this procedure ([id number]) showed: invasive ductal carcinoma, grade 2-3. Prognostic indicators significant for: estrogen receptor, 0% negative and progesterone receptor, 0% negative. Proliferation marker Ki67 at 40%.  I do not find HER-2 receptor documentation  The right axillary lymph node biopsied at that time showed reactive germinal centers.  She underwent bilateral diagnostic mammography with tomography at [facility] on 05/05/2019 showing: breast density category D; biopsy-proven right breast malignancy measures 6.1 cm; no additional masses or calcifications seen in the right breast; no evidence of malignancy in the left breast.  The patient's subsequent history is as detailed below.   PAST MEDICAL HISTORY: History reviewed. No pertinent past medical history.  PAST SURGICAL HISTORY: Past Surgical History:  Procedure Laterality Date  . IR IMAGING GUIDED PORT INSERTION  05/24/2019    FAMILY HISTORY: Family History  Problem Relation Age of Onset  . Breast cancer Mother   . Breast cancer Maternal Aunt    Patient's father is 64 and her mother 36 as of November 2020.  The patient's mother was diagnosed with breast cancer in her early 69s.  She lives in [place]. The patient's mother's sister was also diagnosed with breast cancer in her early 84s.  The patient herself has 1 sister, no brothers.  She is not aware of any ovarian cancer cases in the family.   GYNECOLOGIC HISTORY:  No LMP recorded.  Menarche: 29 years old Age at first live birth: 29 years old [place] P 2 LMPregular Contraceptive HRT n/a  Hysterectomy? no BSO? no   SOCIAL HISTORY: (updated 04/2019)  [name] is  currently working at [employer].  She is originally from [place].  She is divorced.  Her children are Brianna Owens, 46 years old, living in [place] with his father, and Brianna Owens, 53 years old, who lives with the patient.  Also at home are her uncle Brianna Owens, his wife, and that wife's cousin.     ADVANCED DIRECTIVES: Not in place   HEALTH MAINTENANCE: Social History   Tobacco Use  . Smoking status: Never Smoker  . Smokeless tobacco: Never Used  Substance Use Topics  . Alcohol use: Not Currently  . Drug use: Not Currently     Colonoscopy: n/a  PAP: 04/26/2019, negative  Bone density: n/a   No Known Allergies  Current Outpatient Medications  Medication Sig Dispense Refill  . prochlorperazine (COMPAZINE) 10 MG tablet Take 10 mg by mouth every 6 (six) hours as needed for nausea or vomiting.     No current facility-administered medications for this visit.    OBJECTIVE:  Spanish speaker in no acute distress  Vitals:   11/03/19 1305  BP: 118/73  Pulse: 94  Resp: 18  Temp: 98.3 F (36.8 C)  SpO2: 100%     Body mass index is 25.39 kg/m.   Wt Readings from Last 3 Encounters:  11/03/19 167 lb (75.8 kg)  10/26/19 168 lb 4.8 oz (76.3 kg)  10/20/19 165 lb 8 oz (75.1 kg)      ECOG FS:1 - Symptomatic but completely ambulatory GENERAL: Patient is a well appearing female in no acute distress HEENT:  Sclerae anicteric.  Mask in place. Neck is supple.  NODES:  No cervical, supraclavicular, or axillary lymphadenopathy palpated.  BREAST EXAM:  No breast cancer progression noted LUNGS:  Clear to auscultation bilaterally.  No wheezes or rhonchi. HEART:  Regular rate and rhythm. No murmur appreciated. ABDOMEN:  Soft, nontender.  Positive, normoactive bowel sounds. No organomegaly palpated. MSK:  No focal spinal tenderness to palpation. Full range of motion bilaterally in the upper extremities. EXTREMITIES:  No peripheral edema.   SKIN:  Clear with no obvious rashes or skin changes. No nail  dyscrasia. NEURO:  Nonfocal. Well oriented.  Appropriate affect.    LAB RESULTS:  CMP     Component Value Date/Time   NA 141 11/03/2019 1300   K 3.6 11/03/2019 1300   CL 107 11/03/2019 1300   CO2 25 11/03/2019 1300   GLUCOSE 134 (H) 11/03/2019 1300   BUN 11 11/03/2019 1300   CREATININE 0.61 11/03/2019 1300   CREATININE 0.45 07/21/2019 1025   CALCIUM 9.2 11/03/2019 1300   PROT 7.6 11/03/2019 1300   ALBUMIN 3.9 11/03/2019 1300   AST 110 (H) 11/03/2019 1300   AST 29 07/21/2019 1025   ALT 265 (H) 11/03/2019 1300   ALT 58 (H) 07/21/2019 1025   ALKPHOS 102 11/03/2019 1300   BILITOT 0.3 11/03/2019 1300   BILITOT 0.3 07/21/2019 1025   GFRNONAA >60 11/03/2019 1300   GFRNONAA >60 07/21/2019 1025   GFRAA >60 11/03/2019 1300   GFRAA >60 07/21/2019 1025    No results found for: TOTALPROTELP, ALBUMINELP, A1GS, A2GS, BETS, BETA2SER, GAMS, MSPIKE, SPEI  No results found for: KPAFRELGTCHN, LAMBDASER, KAPLAMBRATIO  Lab Results  Component Value Date   WBC 2.8 (L) 11/03/2019   NEUTROABS 1.3 (L) 11/03/2019   HGB 9.7 (L)  11/03/2019   HCT 29.0 (L) 11/03/2019   MCV 97.6 11/03/2019   PLT 251 11/03/2019    No results found for: LABCA2  No components found for: WVPXTG626  No results for input(s): INR in the last 168 hours.  No results found for: LABCA2  No results found for: RSW546  No results found for: EVO350  No results found for: KXF818  No results found for: CA2729  No components found for: HGQUANT  No results found for: CEA1 / No results found for: CEA1   No results found for: AFPTUMOR  No results found for: CHROMOGRNA  No results found for: HGBA, HGBA2QUANT, HGBFQUANT, HGBSQUAN (Hemoglobinopathy evaluation)   No results found for: LDH  No results found for: IRON, TIBC, IRONPCTSAT (Iron and TIBC)  No results found for: FERRITIN  Urinalysis No results found for: COLORURINE, APPEARANCEUR, LABSPEC, PHURINE, GLUCOSEU, HGBUR, BILIRUBINUR, KETONESUR, PROTEINUR,  UROBILINOGEN, NITRITE, LEUKOCYTESUR   STUDIES: No results found.   ELIGIBLE FOR AVAILABLE RESEARCH PROTOCOL:no  ASSESSMENT: 29 y.o. BRCA1 positive Brianna Owens woman status post right breast overlapping sites biopsy 05/04/2019 for a clinical T3 N0, Owens IIIB invasive ductal carcinoma, grade 2, triple negative, with an MIB-1 of 40%.  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  The Common Hereditary Gene Panel offered by [facility] includes sequencing and/or deletion duplication testing of the following 48 genes: APC, ATM, AXIN2, BARD1, BMPR1A, BRCA1, BRCA2, BRIP1, CDH1, CDK4, CDKN2A (p14ARF), CDKN2A (p16INK4a), CHEK2, CTNNA1, DICER1, EPCAM (Deletion/duplication testing only), GREM1 (promoter region deletion/duplication testing only), KIT, MEN1, MLH1, MSH2, MSH3, MSH6, MUTYH, NBN, NF1, NHTL1, PALB2, PDGFRA, PMS2, POLD1, POLE, PTEN, RAD50, RAD51C, RAD51D, RNF43, SDHB, SDHC, SDHD, SMAD4, SMARCA4. STK11, TP53, TSC1, TSC2, and VHL.  The following genes were evaluated for sequence changes only: SDHA and HOXB13 c.251G>A variant only. The report date is 05/24/2019.  (b) patient recommended bilateral mastectomies, due to potential barriers/risks with age and intensified screening  (2) neoadjuvant chemotherapy consisting of doxorubicin and cyclophosphamide in dose dense fashion x4 started 05/26/2019, completed 07/07/2019, followed by paclitaxel and carboplatin weekly x12 started 07/21/2019  (a) breast MRI 09/26/2019 shows a complete radiologic response  (3) definitive surgery to follow  (4) adjuvant radiation to follow  (5) consider bilateral salpingo-oophorectomy at the end of breast cancer treatment  (a) receiving Lupron every 28 days    PLAN: Brianna Owens is doing well today.  She met with myself and Dr. Jana Hakim  today, as her liver enzymes are continuing to increase.  She will forego her final chemotherapy today and proceed to surgery.  She was congratulated on completing her treatment.    Brianna Owens will return on 11/24/2019 for labs and f/u with Dr. Jana Hakim to discuss her pathology results.  She was recommended to continue with the appropriate pandemic precautions. She knows to call for any questions that may arise between now and her next appointment.  We are happy to see her sooner if needed.   Total encounter time 20 minutes.Wilber Bihari, NP 11/07/19 12:33 PM Medical Oncology and Hematology [facility] [place],  [address] Tel. [phone]    Fax. [phone]   ADDENDUM: Brianna Owens's liver function tests are actually a little bit higher today than last week.  At this point, with the patient having received 11 out of the 12 planned doses of weekly chemotherapy, we are  simply stopping the chemo.  She has had a very good response by MRI and I am hopeful that the surgery will bear this out.  She is not ready to proceed to surgery.  We have suggested to the patient that she strongly consider bilateral mastectomies.  She very much prefers to have a lumpectomy and keep her breasts.  This is very understandable.  At the same time she will need intensified screening and this will be problematic from a financial point of view.  It is also not clear if the patient will remain in this area or move.  Continuity of care also may be a concern.  She already has her surgery date.  She will see me again on 11/24/2019 to discuss results.  I personally saw this patient and performed a substantive portion of this encounter with the listed APP documented above.   Chauncey Cruel, MD Medical Oncology and Hematology [facility] [address] [place], [place] [address] Tel. [phone]    Fax. [phone]      *Total Encounter Time as defined by the  Centers for Medicare and Medicaid Services includes, in addition to the face-to-face time of a patient visit (documented in the note above) non-face-to-face time: obtaining and reviewing outside history, ordering and reviewing medications, tests or procedures, care coordination (communications with other health care professionals or caregivers) and documentation in the medical record.

## 2019-11-10 ENCOUNTER — Inpatient Hospital Stay: Payer: Self-pay

## 2019-11-10 ENCOUNTER — Other Ambulatory Visit: Payer: Self-pay

## 2019-11-10 DIAGNOSIS — Z01419 Encounter for gynecological examination (general) (routine) without abnormal findings: Secondary | ICD-10-CM

## 2019-11-10 DIAGNOSIS — C50811 Malignant neoplasm of overlapping sites of right female breast: Secondary | ICD-10-CM

## 2019-11-10 DIAGNOSIS — Z95828 Presence of other vascular implants and grafts: Secondary | ICD-10-CM

## 2019-11-10 MED ORDER — LEUPROLIDE ACETATE 3.75 MG IM KIT
3.7500 mg | PACK | Freq: Once | INTRAMUSCULAR | Status: DC
  Filled 2019-11-10: qty 3.75

## 2019-11-11 NOTE — Pre-Procedure Instructions (Signed)
[place]  11/11/2019      [place] [address] [place] [address] Phone: [phone] Fax: [phone]    Your procedure is scheduled on June 3  Report to [facility] Entrance  A at 5:30 A.M.  Call this number if you have problems the morning of surgery:  [phone]   Remember:  Do not eat  after midnight.  You may drink clear liquids until 4:30 A.M. .  Clear liquids allowed are:                    Water, Juice (non-citric and without pulp - diabetics please choose diet or no sugar options), Carbonated beverages - (diabetics please choose diet or no sugar options), Clear Tea, Black Coffee only (no creamer, milk or cream including half and half), Plain Jell-O only (diabetics please choose diet or no sugar options), Gatorade (diabetics please choose diet or no sugar options) and Plain Popsicles only    Take these medicines the morning of surgery with A SIP OF WATER :              Compazine if needed              7 days prior to surgery STOP taking any Aspirin (unless otherwise instructed by your surgeon), Aleve, Naproxen, Ibuprofen, Motrin, Advil, Goody's, BC's, all herbal medications, fish oil, and all vitamins.    Do not wear jewelry, make-up or nail polish.  Do not wear lotions, powders, or perfumes, or deodorant.  Do not shave 48 hours prior to surgery.  Men may shave face and neck.  Do not bring valuables to the hospital.  [facility] is not responsible for any belongings or valuables.  Contacts, dentures or bridgework may not be worn into surgery.  Leave your suitcase in the car.  After surgery it may be brought to your room.  For patients admitted to the hospital, discharge time will be determined by your treatment team.  Patients discharged the day of surgery will not be allowed to drive home.    Special instructions:  [facility]- Preparing For Surgery  Before surgery,  you can play an important role. Because skin is not sterile, your skin needs to be as free of germs as possible. You can reduce the number of germs on your skin by washing with CHG (chlorahexidine gluconate) Soap before surgery.  CHG is an antiseptic cleaner which kills germs and bonds with the skin to continue killing germs even after washing.    Oral Hygiene is also important to reduce your risk of infection.  Remember - BRUSH YOUR TEETH THE MORNING OF SURGERY WITH YOUR REGULAR TOOTHPASTE  Please do not use if you have an allergy to CHG or antibacterial soaps. If your skin becomes reddened/irritated stop using the CHG.  Do not shave (including legs and underarms) for at least 48 hours prior to first CHG shower. It is OK to shave your face.  Please follow these instructions carefully.   1. Shower the NIGHT BEFORE SURGERY and the MORNING OF SURGERY with CHG.   2. If you chose to wash your hair, wash your hair first as usual with your normal shampoo.  3. After you shampoo, rinse your hair and body thoroughly to remove the shampoo.  4. Use CHG as you would any other liquid soap. You can apply CHG directly to the skin and wash gently with  a scrungie or a clean washcloth.   5. Apply the CHG Soap to your body ONLY FROM THE NECK DOWN.  Do not use on open wounds or open sores. Avoid contact with your eyes, ears, mouth and genitals (private parts). Wash Face and genitals (private parts)  with your normal soap.  6. Wash thoroughly, paying special attention to the area where your surgery will be performed.  7. Thoroughly rinse your body with warm water from the neck down.  8. DO NOT shower/wash with your normal soap after using and rinsing off the CHG Soap.  9. Pat yourself dry with a CLEAN TOWEL.  10. Wear CLEAN PAJAMAS to bed the night before surgery, wear comfortable clothes the morning of surgery  11. Place CLEAN SHEETS on your bed the night of your first shower and DO NOT SLEEP WITH  PETS.    Day of Surgery:  Do not apply any deodorants/lotions.  Please wear clean clothes to the hospital/surgery center.   Remember to brush your teeth WITH YOUR REGULAR TOOTHPASTE.    Please read over the following fact sheets that you were given. Coughing and Deep Breathing and Surgical Site Infection Prevention

## 2019-11-12 ENCOUNTER — Other Ambulatory Visit (HOSPITAL_COMMUNITY): Payer: Self-pay

## 2019-11-15 ENCOUNTER — Other Ambulatory Visit (HOSPITAL_COMMUNITY)
Admission: RE | Admit: 2019-11-15 | Discharge: 2019-11-15 | Disposition: A | Discharge status: Home / Self Care | Payer: HRSA Program | Source: Ambulatory Visit | Attending: Surgery | Admitting: Surgery

## 2019-11-15 ENCOUNTER — Other Ambulatory Visit: Payer: Self-pay

## 2019-11-15 ENCOUNTER — Encounter (HOSPITAL_COMMUNITY)
Admission: RE | Admit: 2019-11-15 | Discharge: 2019-11-15 | Disposition: A | Discharge status: Home / Self Care | Payer: Self-pay | Source: Ambulatory Visit | Attending: Surgery | Admitting: Surgery

## 2019-11-15 ENCOUNTER — Encounter (HOSPITAL_COMMUNITY): Payer: Self-pay

## 2019-11-15 DIAGNOSIS — Z20822 Contact with and (suspected) exposure to covid-19: Secondary | ICD-10-CM | POA: Insufficient documentation

## 2019-11-15 DIAGNOSIS — Z01812 Encounter for preprocedural laboratory examination: Secondary | ICD-10-CM | POA: Diagnosis present

## 2019-11-15 LAB — BASIC METABOLIC PANEL
Anion gap: 8 (ref 5–15)
BUN: 7 mg/dL (ref 6–20)
CO2: 28 mmol/L (ref 22–32)
Calcium: 9.9 mg/dL (ref 8.9–10.3)
Chloride: 104 mmol/L (ref 98–111)
Creatinine, Ser: 0.45 mg/dL (ref 0.44–1.00)
GFR calc Af Amer: >60 mL/min (ref >60)
GFR calc non Af Amer: >60 mL/min (ref >60)
Glucose, Bld: 112 mg/dL — ABNORMAL HIGH (ref 70–99)
Potassium: 3.7 mmol/L (ref 3.5–5.1)
Sodium: 140 mmol/L (ref 135–145)

## 2019-11-15 LAB — HEPATIC FUNCTION PANEL
ALT: 209 U/L — ABNORMAL HIGH (ref 0–44)
AST: 102 U/L — ABNORMAL HIGH (ref 15–41)
Albumin: 3.8 g/dL (ref 3.5–5.0)
Alkaline Phosphatase: 87 U/L (ref 38–126)
Bilirubin, Direct: <0.1 mg/dL (ref 0.0–0.2)
Total Bilirubin: 0.5 mg/dL (ref 0.3–1.2)
Total Protein: 7.8 g/dL (ref 6.5–8.1)

## 2019-11-15 LAB — CBC
HCT: 33.2 % — ABNORMAL LOW (ref 36.0–46.0)
Hemoglobin: 11.1 g/dL — ABNORMAL LOW (ref 12.0–15.0)
MCH: 33.2 pg (ref 26.0–34.0)
MCHC: 33.4 g/dL (ref 30.0–36.0)
MCV: 99.4 fL (ref 80.0–100.0)
Platelets: 191 10*3/uL (ref 150–400)
RBC: 3.34 MIL/uL — ABNORMAL LOW (ref 3.87–5.11)
RDW: 13.8 % (ref 11.5–15.5)
WBC: 5.6 10*3/uL (ref 4.0–10.5)
nRBC: 0 % (ref 0.0–0.2)

## 2019-11-15 LAB — SARS CORONAVIRUS 2 (TAT 6-24 HRS): SARS Coronavirus 2: NEGATIVE

## 2019-11-15 LAB — POCT PREGNANCY, URINE: Preg Test, Ur: NEGATIVE

## 2019-11-15 NOTE — Progress Notes (Signed)
Anesthesia Chart Review:  Recently elevated LFTs secondary to chemotherapy. Per oncology note 11/03/19, "Brianna Owens's liver function tests are actually a little bit higher today than last week.  At this point, with the patient having received 11 out of the 12 planned doses of weekly chemotherapy, we are simply stopping the chemo."  Hgb also low, 9s range.   On preop labs Hgb is improved to 11.1. AST 102 and ALT 209. Down from 110 and 265, respectively, on 11/03/19.  Reviewed with Dr. Valma Cava. Okay to proceed given improving labs after stopping chemotherapy.    TTE 05/25/19: 1. Left ventricular ejection fraction, by visual estimation, is 55 to  60%. The left ventricle has normal function. There is no left ventricular  hypertrophy.  2. The left ventricle has no regional wall motion abnormalities.  3. GLS underestimated due to poor endocardial tracking.  4. Global right ventricle has normal systolic function.The right  ventricular size is normal. No increase in right ventricular wall  thickness.  5. Left atrial size was normal.  6. Right atrial size was normal.  7. Trivial pericardial effusion is present.  8. The mitral valve is normal in structure. No evidence of mitral valve  regurgitation.  9. The tricuspid valve is normal in structure. Tricuspid valve  regurgitation is trivial.  10. The aortic valve is normal in structure. Aortic valve regurgitation is  not visualized.  11. The pulmonic valve was normal in structure. Pulmonic valve  regurgitation is not visualized.  12. The average left ventricular global longitudinal strain is -13.8 %.    [name] [facility] Short Stay Center/Anesthesiology Phone [phone] 11/15/2019 4:23 PM

## 2019-11-15 NOTE — Progress Notes (Signed)
Patient did not show for her PAT appt.  Spoke to her on phone (some language barrier Spanish is her first language) and she was unaware of her appt.  She wanted to speak to her Dr. I told her I was not in her Dr's office and she would need to call him directly.  I did reiterate that she had a procedure scheduled 11/17/19.

## 2019-11-15 NOTE — Anesthesia Preprocedure Evaluation (Addendum)
Anesthesia Evaluation  Patient identified by MRN, date of birth, ID band Patient awake    Reviewed: Allergy & Precautions, NPO status , Patient's Chart, lab work & pertinent test results  Airway Mallampati: II  TM Distance: >3 FB Neck ROM: Full    Dental no notable dental hx. (+) Teeth Intact, Dental Advisory Given   Pulmonary neg pulmonary ROS,    Pulmonary exam normal breath sounds clear to auscultation       Cardiovascular Exercise Tolerance: Good Normal cardiovascular exam Rhythm:Regular Rate:Normal     Neuro/Psych negative neurological ROS  negative psych ROS   GI/Hepatic   Endo/Other    Renal/GU K+ 3.7     Musculoskeletal negative musculoskeletal ROS (+)   Abdominal   Peds  Hematology hgb 11.1   Anesthesia Other Findings R breast CA  Reproductive/Obstetrics negative OB ROS                            Anesthesia Physical Anesthesia Plan  ASA: II  Anesthesia Plan: General   Post-op Pain Management:  Regional for Post-op pain   Induction: Intravenous  PONV Risk Score and Plan: 4 or greater and Treatment may vary due to age or medical condition, Ondansetron and Dexamethasone  Airway Management Planned: LMA  Additional Equipment: None  Intra-op Plan:   Post-operative Plan:   Informed Consent: I have reviewed the patients History and Physical, chart, labs and discussed the procedure including the risks, benefits and alternatives for the proposed anesthesia with the patient or authorized representative who has indicated his/her understanding and acceptance.     Dental advisory given  Plan Discussed with: CRNA  Anesthesia Plan Comments: (GA w R pec block hx w spanish interpreter  PAT note by Karoline Caldwell, PA-C: Recently elevated LFTs secondary to chemotherapy. Per oncology note 11/03/19, "Montserrat's liver function tests are actually a little bit higher today than last  week.  At this point, with the patient having received 11 out of the 12 planned doses of weekly chemotherapy, we are simply stopping the chemo."  Hgb also low, 9s range.   On preop labs Hgb is improved to 11.1. AST 102 and ALT 209. Down from 110 and 265, respectively, on 11/03/19.  Reviewed with Dr. Valma Cava. Okay to proceed given improving labs after stopping chemotherapy.      TTE 05/25/19: 1. Left ventricular ejection fraction, by visual estimation, is 55 to  60%. The left ventricle has normal function. There is no left ventricular  hypertrophy.  2. The left ventricle has no regional wall motion abnormalities.  3. GLS underestimated due to poor endocardial tracking.  4. Global right ventricle has normal systolic function.The right  ventricular size is normal. No increase in right ventricular wall  thickness.  5. Left atrial size was normal.  6. Right atrial size was normal.  7. Trivial pericardial effusion is present.  8. The mitral valve is normal in structure. No evidence of mitral valve  regurgitation.  9. The tricuspid valve is normal in structure. Tricuspid valve  regurgitation is trivial.  10. The aortic valve is normal in structure. Aortic valve regurgitation is  not visualized.  11. The pulmonic valve was normal in structure. Pulmonic valve  regurgitation is not visualized.  12. The average left ventricular global longitudinal strain is -13.8 %. )     Anesthesia Quick Evaluation

## 2019-11-15 NOTE — Progress Notes (Addendum)
PCP - None Oncologist - Dr. [name] Jews Magrinat Cardiologist - None  No PMH other than her cancer   ERAS Protcol -Yes  COVID TEST- November 15, 2019  Anesthesia review: Yes has elevated AST / ALT labs redrawn today   Patient denies shortness of breath, fever, cough and chest pain at PAT appointment  All instructions explained to the patient, with a verbal understanding of the material. Patient agrees to go over the instructions while at home for a better understanding. Patient also instructed to self quarantine after being tested for COVID-19. The opportunity to ask questions was provided.  Interpreter used for PAT visit

## 2019-11-15 NOTE — Progress Notes (Signed)
Contacted patient with [place] interpreter services to clarify PAT and covid test appointment.  Patient stated state she was on the way for covid test appointment.  Informed patient that she missed her 1000 PAT appointment and that she had to have the PAT appointment prior to seed placement on 6/2.  Patient did not seem to understand that she had a PAT appointment.  Patient continued to say that she could not come today for appointment but finally said she would come at 2:00.  Patient given address of hospital.  Jackelyn Poling at [facility] notified of situation.

## 2019-11-16 ENCOUNTER — Ambulatory Visit
Admission: RE | Admit: 2019-11-16 | Discharge: 2019-11-16 | Disposition: A | Discharge status: Home / Self Care | Payer: No Typology Code available for payment source | Source: Ambulatory Visit | Attending: Surgery | Admitting: Surgery

## 2019-11-16 DIAGNOSIS — C50911 Malignant neoplasm of unspecified site of right female breast: Secondary | ICD-10-CM

## 2019-11-16 IMAGING — MG MM PLC BREAST LOC DEV 1ST LESION INC*R*
8 series · 8 of 8 positions shown · non-contrast
Comparison: Previous exam(s).

CLINICAL DATA: 28-year-old female for radioactive seed localization
of RIGHT breast cancer.

EXAM:
MAMMOGRAPHIC GUIDED RADIOACTIVE SEED LOCALIZATION OF THE RIGHT
BREAST

[R CC (1 of 4)]
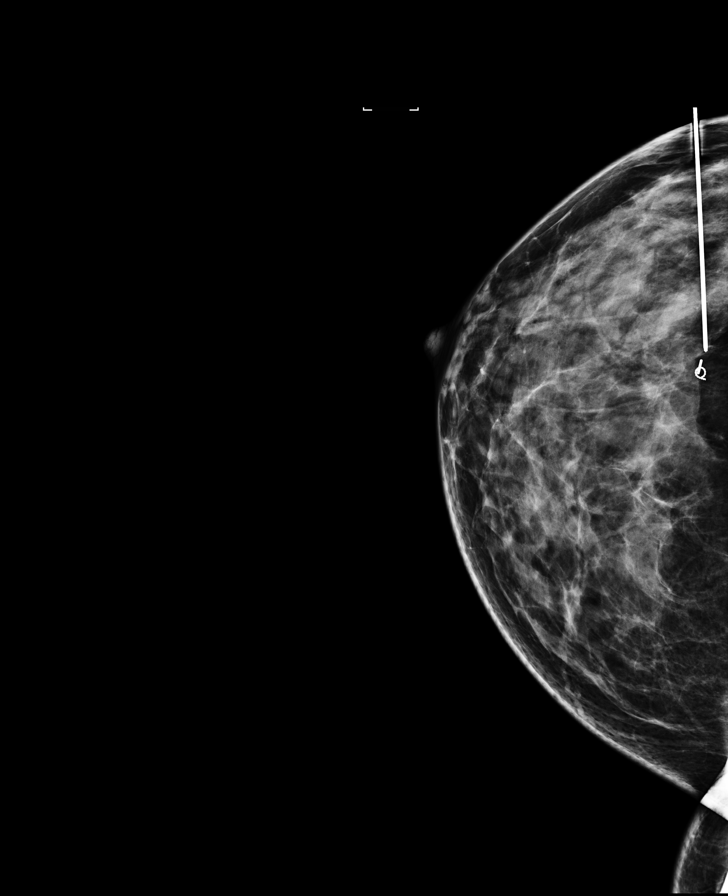

[R LM (1 of 4)]
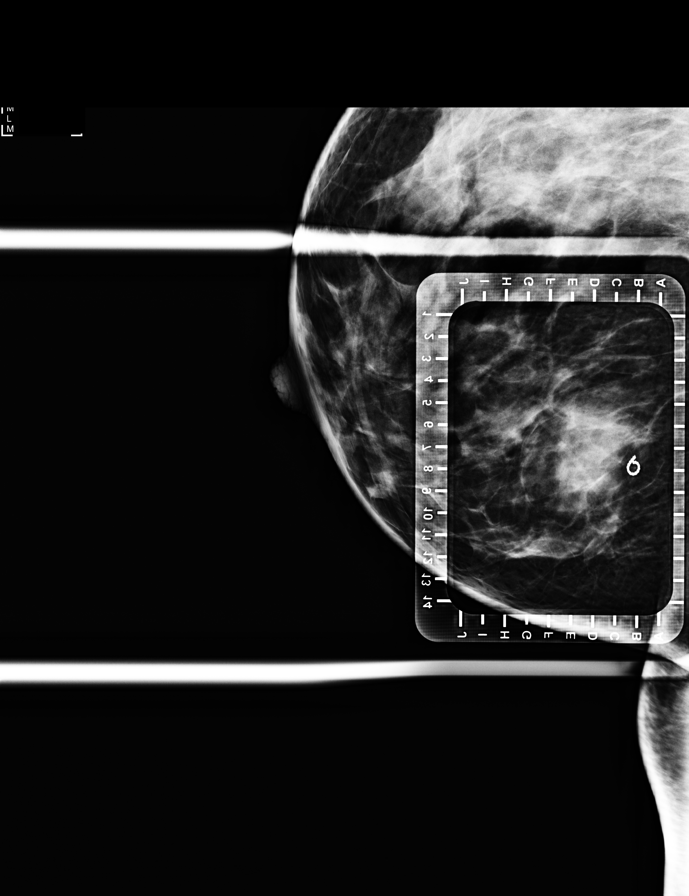

[R LM (2 of 4)]
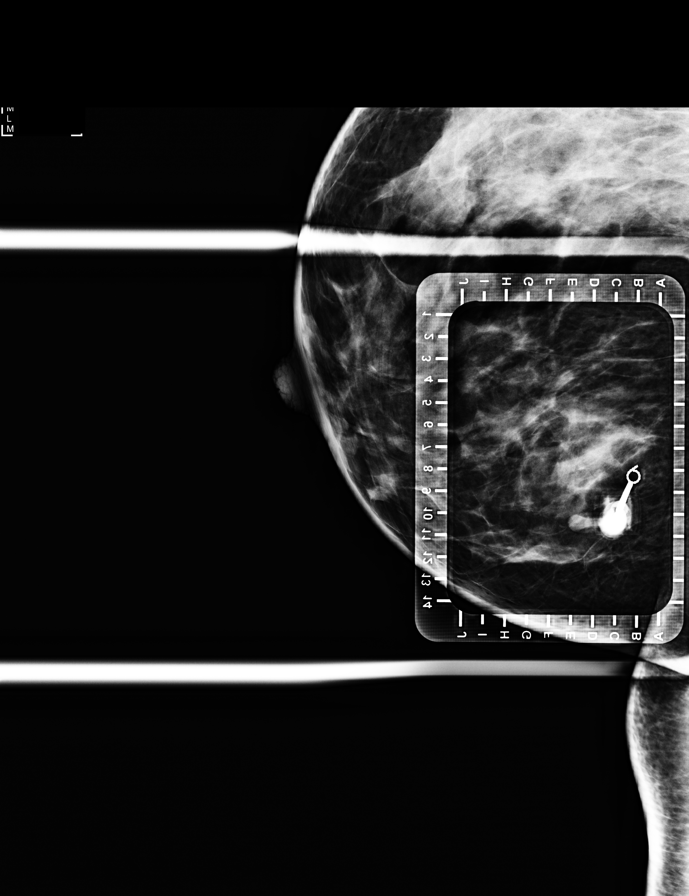

[R LM (3 of 4)]
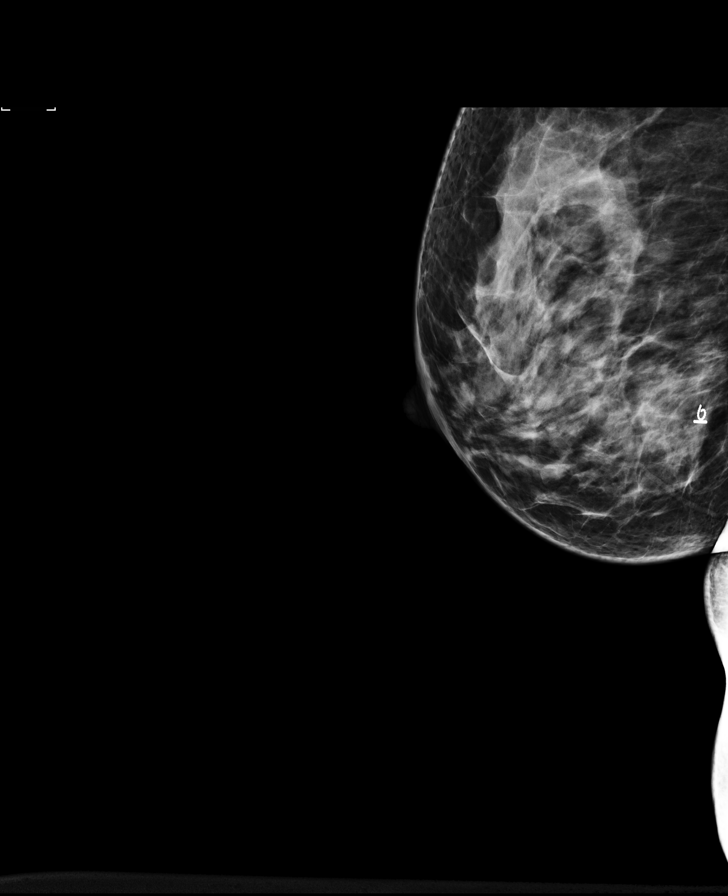

[R CC (2 of 4)]
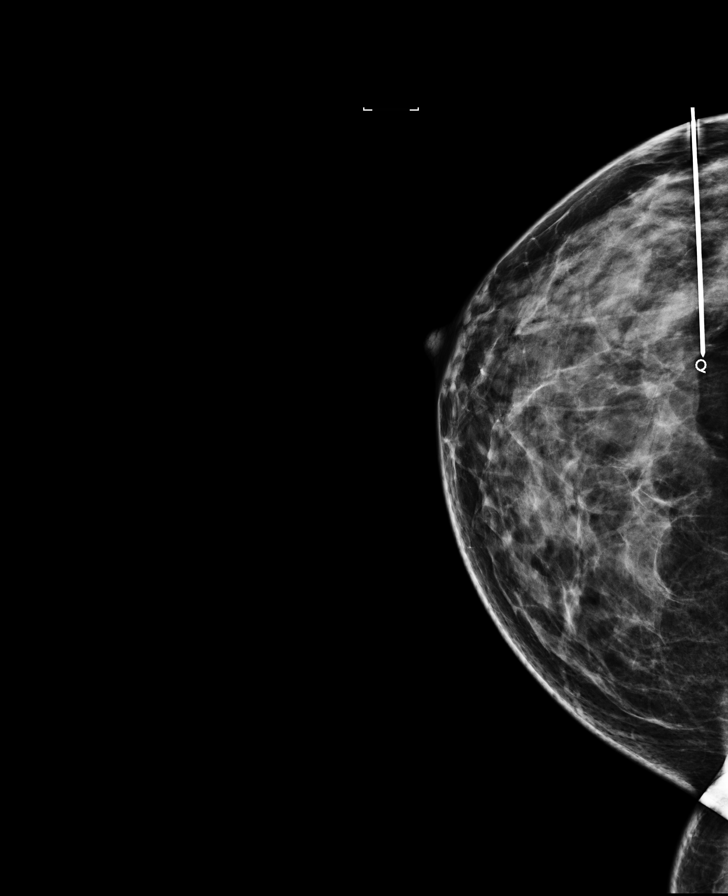

[R CC (3 of 4)]
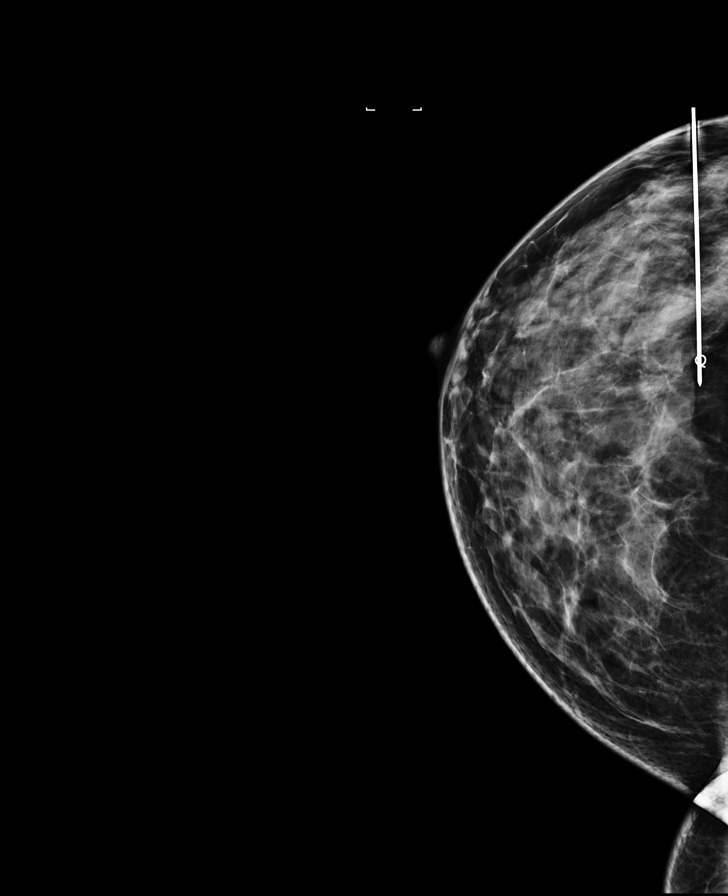

[R CC (4 of 4)]
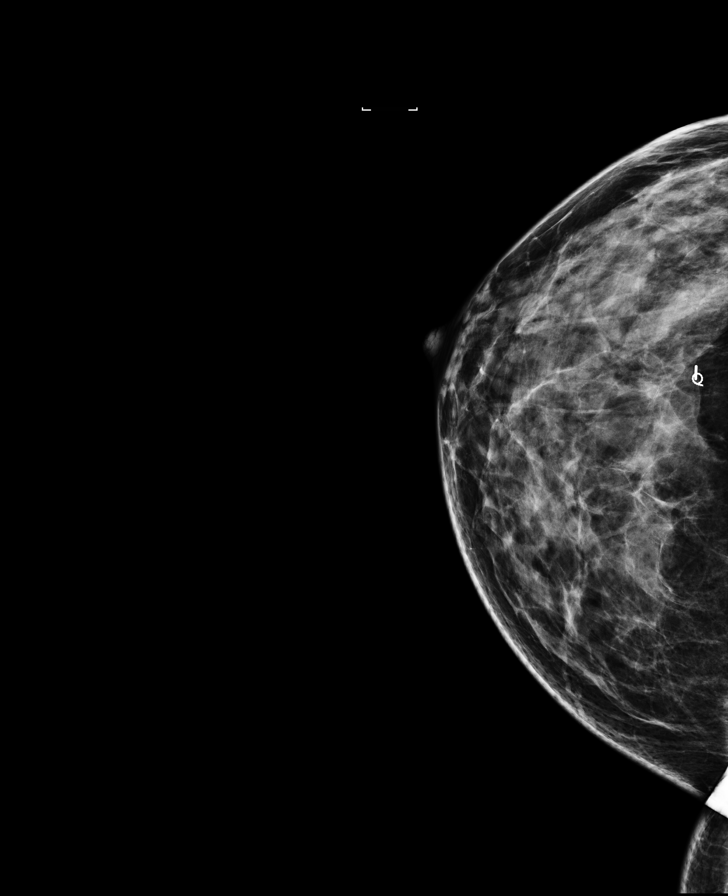

[R LM (4 of 4)]
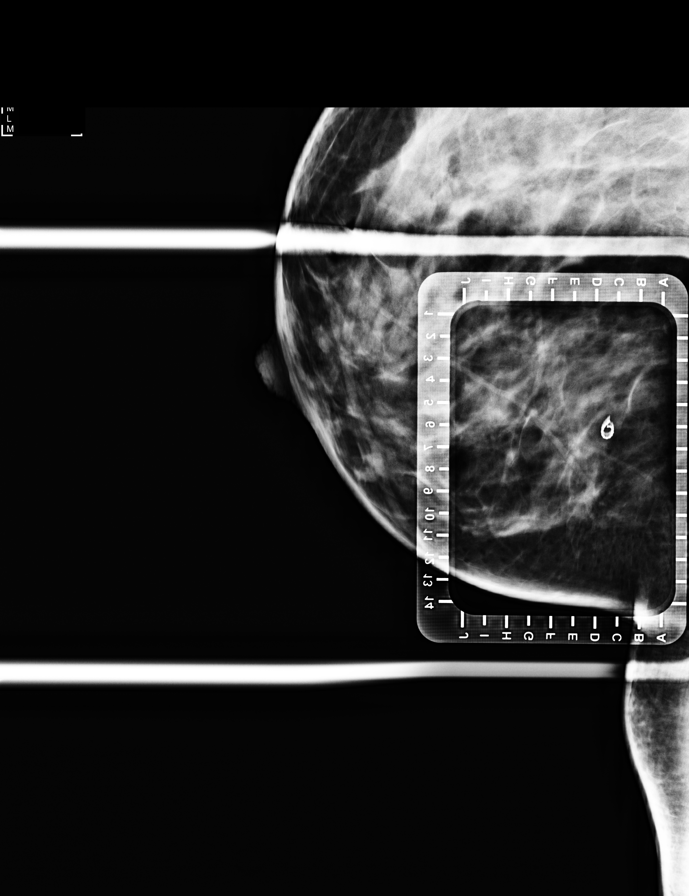

[8 of 8 positions shown; findings below may reference images not displayed]

FINDINGS: Patient presents for radioactive seed localization prior to RIGHT
lumpectomy. I met with the patient and we discussed the procedure of
seed localization including benefits and alternatives. We discussed
the high likelihood of a successful procedure. We discussed the
risks of the procedure including infection, bleeding, tissue injury
and further surgery. We discussed the low dose of radioactivity
involved in the procedure. Informed, written consent was given.

The usual time-out protocol was performed immediately prior to the
procedure.

Using mammographic guidance, sterile technique, 1% lidocaine and an
[9J] radioactive seed, the Q clip was localized using a LATERAL
approach. The follow-up mammogram images confirm the seed in the
expected location and were marked for Dr. AMAZIGH.

Follow-up survey of the patient confirms presence of the radioactive
seed.

Order number of [9J] seed:  [PHONE_NUMBER].

Total activity:  0.251 millicuries.  Reference Date: [DATE].

The patient tolerated the procedure well and was released from the
[REDACTED]. She was given instructions regarding seed removal.
IMPRESSION: Radioactive seed localization RIGHT breast. No apparent
complications.

## 2019-11-17 ENCOUNTER — Ambulatory Visit (HOSPITAL_COMMUNITY): Payer: No Typology Code available for payment source | Admitting: Physician Assistant

## 2019-11-17 ENCOUNTER — Ambulatory Visit
Admission: RE | Admit: 2019-11-17 | Discharge: 2019-11-17 | Disposition: A | Discharge status: Home / Self Care | Payer: No Typology Code available for payment source | Source: Ambulatory Visit | Attending: Surgery | Admitting: Surgery

## 2019-11-17 ENCOUNTER — Encounter (HOSPITAL_COMMUNITY)
Admission: RE | Admit: 2019-11-17 | Discharge: 2019-11-17 | Disposition: A | Discharge status: Home / Self Care | Payer: No Typology Code available for payment source | Source: Ambulatory Visit | Attending: Surgery | Admitting: Surgery

## 2019-11-17 ENCOUNTER — Ambulatory Visit (HOSPITAL_COMMUNITY)
Admission: RE | Admit: 2019-11-17 | Discharge: 2019-11-17 | Disposition: A | Discharge status: Home / Self Care | Payer: No Typology Code available for payment source | Attending: Surgery | Admitting: Surgery

## 2019-11-17 ENCOUNTER — Other Ambulatory Visit: Payer: Self-pay

## 2019-11-17 ENCOUNTER — Encounter (HOSPITAL_COMMUNITY): Payer: Self-pay | Admitting: Surgery

## 2019-11-17 ENCOUNTER — Encounter (HOSPITAL_COMMUNITY)
Admission: RE | Disposition: A | Discharge status: Home / Self Care | Payer: Self-pay | Source: Home / Self Care | Attending: Surgery

## 2019-11-17 DIAGNOSIS — Z9221 Personal history of antineoplastic chemotherapy: Secondary | ICD-10-CM | POA: Insufficient documentation

## 2019-11-17 DIAGNOSIS — C50911 Malignant neoplasm of unspecified site of right female breast: Secondary | ICD-10-CM

## 2019-11-17 DIAGNOSIS — C50511 Malignant neoplasm of lower-outer quadrant of right female breast: Secondary | ICD-10-CM | POA: Insufficient documentation

## 2019-11-17 HISTORY — PX: SENTINEL NODE BIOPSY: SHX6608

## 2019-11-17 HISTORY — PX: BREAST LUMPECTOMY WITH RADIOACTIVE SEED AND SENTINEL LYMPH NODE BIOPSY: SHX6550

## 2019-11-17 IMAGING — MG MM BREAST SURGICAL SPECIMEN
1 series · 1 of 1 positions shown · non-contrast
Comparison: Previous exam(s).

CLINICAL DATA: Specimen radiograph status post right breast
lumpectomy.

EXAM:
SPECIMEN RADIOGRAPH OF THE RIGHT BREAST

[R]
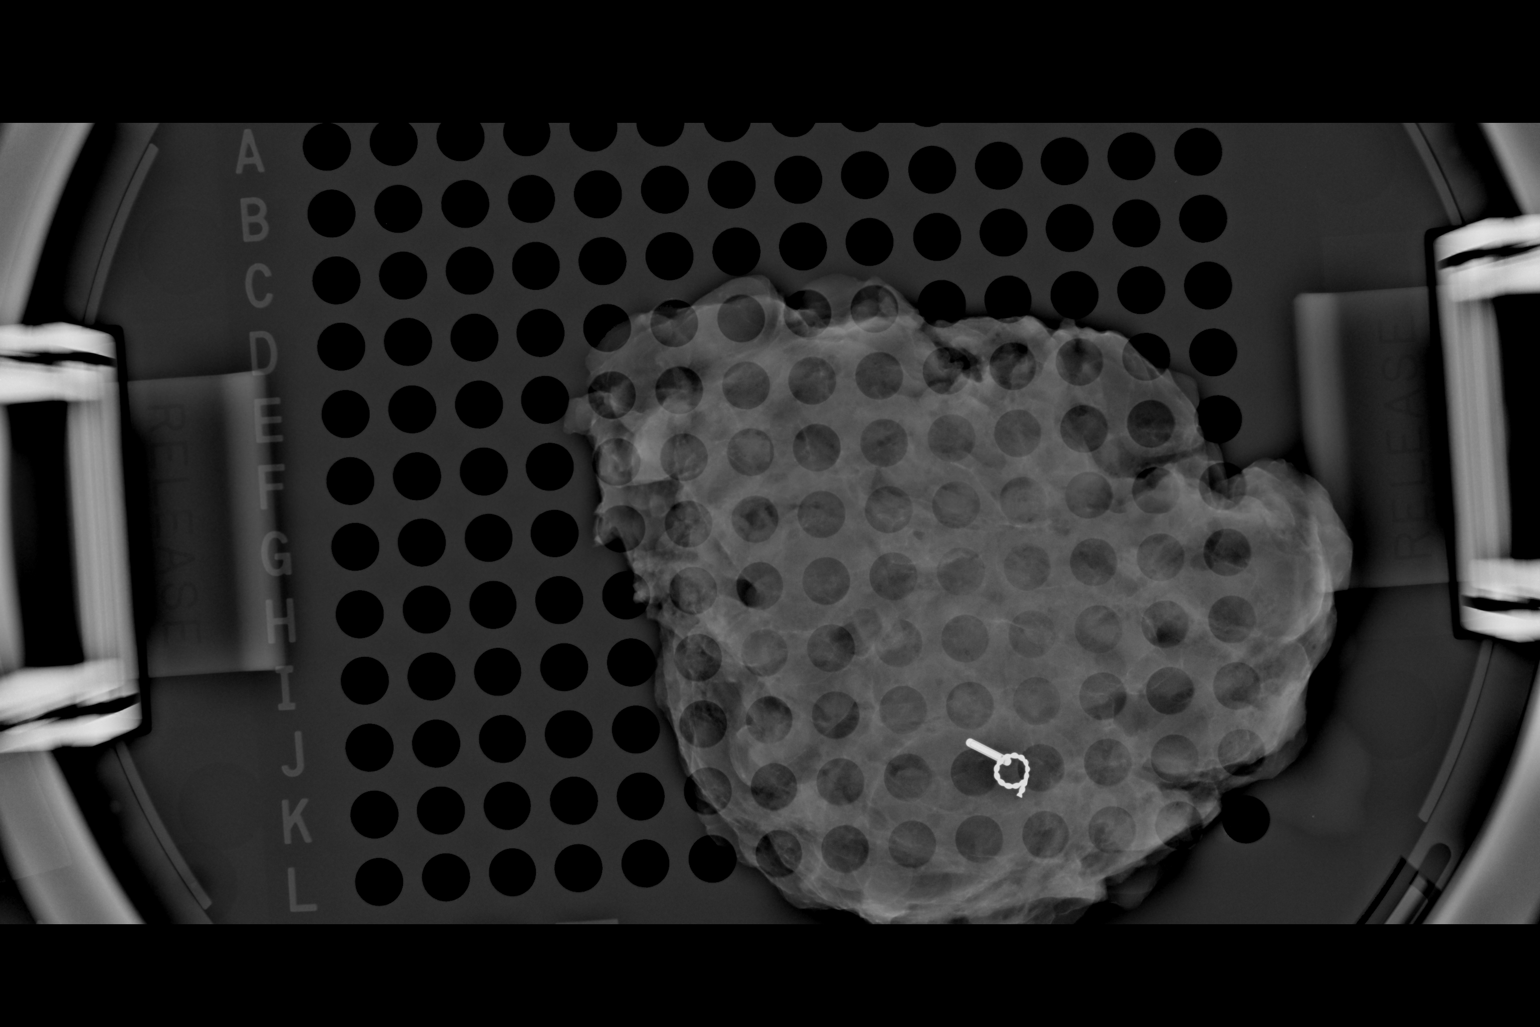

[1 of 1 positions shown; findings below may reference images not displayed]

FINDINGS: Status post excision of the right breast. The radioactive seed and
biopsy marker clip are present, completely intact, and were marked
for pathology. These findings were communicated with the OR at [DATE]
a.m.
IMPRESSION: Specimen radiograph of the right breast.

## 2019-11-17 SURGERY — BREAST LUMPECTOMY WITH RADIOACTIVE SEED AND SENTINEL LYMPH NODE BIOPSY
Anesthesia: General | Site: Breast | Laterality: Right

## 2019-11-17 MED ORDER — BUPIVACAINE HCL (PF) 0.25 % IJ SOLN
INTRAMUSCULAR | Status: AC
  Filled 2019-11-17: qty 30

## 2019-11-17 MED ORDER — FENTANYL CITRATE (PF) 250 MCG/5ML IJ SOLN
INTRAMUSCULAR | Status: AC
  Filled 2019-11-17: qty 5

## 2019-11-17 MED ORDER — GABAPENTIN 300 MG PO CAPS
300.0000 mg | ORAL_CAPSULE | ORAL | Status: AC
  Administered 2019-11-17: 300 mg via ORAL
  Filled 2019-11-17: qty 1

## 2019-11-17 MED ORDER — HYDROMORPHONE HCL 1 MG/ML IJ SOLN: 0.2500 mg | INTRAMUSCULAR | Status: DC | PRN

## 2019-11-17 MED ORDER — ORAL CARE MOUTH RINSE: 15.0000 mL | Freq: Once | OROMUCOSAL | Status: AC

## 2019-11-17 MED ORDER — DEXMEDETOMIDINE HCL IN NACL 200 MCG/50ML IV SOLN
INTRAVENOUS | Status: AC
  Filled 2019-11-17: qty 50

## 2019-11-17 MED ORDER — ACETAMINOPHEN 10 MG/ML IV SOLN: 1000.0000 mg | Freq: Once | INTRAVENOUS | Status: DC | PRN

## 2019-11-17 MED ORDER — FENTANYL CITRATE (PF) 100 MCG/2ML IJ SOLN
INTRAMUSCULAR | Status: DC | PRN
  Administered 2019-11-17: 50 ug via INTRAVENOUS
  Administered 2019-11-17: 25 ug via INTRAVENOUS
  Administered 2019-11-17 (×2): 50 ug via INTRAVENOUS

## 2019-11-17 MED ORDER — OXYCODONE HCL 5 MG PO TABS
5.0000 mg | ORAL_TABLET | Freq: Four times a day (QID) | ORAL | 0 refills | Status: DC | PRN
Start: 2019-11-17 — End: 2020-01-19

## 2019-11-17 MED ORDER — 0.9 % SODIUM CHLORIDE (POUR BTL) OPTIME
TOPICAL | Status: DC | PRN
  Administered 2019-11-17: 1000 mL

## 2019-11-17 MED ORDER — LACTATED RINGERS IV SOLN: INTRAVENOUS | Status: DC

## 2019-11-17 MED ORDER — CEFAZOLIN SODIUM-DEXTROSE 2-4 GM/100ML-% IV SOLN
2.0000 g | INTRAVENOUS | Status: AC
  Administered 2019-11-17: 2 g via INTRAVENOUS
  Filled 2019-11-17: qty 100

## 2019-11-17 MED ORDER — ONDANSETRON HCL 4 MG/2ML IJ SOLN
INTRAMUSCULAR | Status: AC
  Filled 2019-11-17: qty 2

## 2019-11-17 MED ORDER — OXYCODONE HCL 5 MG/5ML PO SOLN: 5.0000 mg | Freq: Once | ORAL | Status: DC | PRN

## 2019-11-17 MED ORDER — BUPIVACAINE-EPINEPHRINE 0.25% -1:200000 IJ SOLN
INTRAMUSCULAR | Status: DC | PRN
  Administered 2019-11-17: 9 mL

## 2019-11-17 MED ORDER — DEXAMETHASONE SODIUM PHOSPHATE 10 MG/ML IJ SOLN
INTRAMUSCULAR | Status: AC
  Filled 2019-11-17: qty 1

## 2019-11-17 MED ORDER — ACETAMINOPHEN 500 MG PO TABS
1000.0000 mg | ORAL_TABLET | ORAL | Status: AC
  Administered 2019-11-17: 1000 mg via ORAL
  Filled 2019-11-17: qty 2

## 2019-11-17 MED ORDER — SODIUM CHLORIDE (PF) 0.9 % IJ SOLN
INTRAVENOUS | Status: DC | PRN
  Administered 2019-11-17: 5 mL

## 2019-11-17 MED ORDER — DEXMEDETOMIDINE HCL IN NACL 200 MCG/50ML IV SOLN
INTRAVENOUS | Status: DC | PRN
  Administered 2019-11-17: 12 ug via INTRAVENOUS

## 2019-11-17 MED ORDER — PHENYLEPHRINE HCL (PRESSORS) 10 MG/ML IV SOLN
INTRAVENOUS | Status: DC | PRN
  Administered 2019-11-17: 80 ug via INTRAVENOUS
  Administered 2019-11-17: 120 ug via INTRAVENOUS
  Administered 2019-11-17: 80 ug via INTRAVENOUS
  Administered 2019-11-17: 120 ug via INTRAVENOUS

## 2019-11-17 MED ORDER — CHLORHEXIDINE GLUCONATE CLOTH 2 % EX PADS: 6.0000 | MEDICATED_PAD | Freq: Once | CUTANEOUS | Status: DC

## 2019-11-17 MED ORDER — SODIUM CHLORIDE (PF) 0.9 % IJ SOLN
INTRAMUSCULAR | Status: AC
  Filled 2019-11-17: qty 10

## 2019-11-17 MED ORDER — ONDANSETRON HCL 4 MG/2ML IJ SOLN
INTRAMUSCULAR | Status: DC | PRN
Start: 2019-11-17 — End: 2019-11-17
  Administered 2019-11-17: 4 mg via INTRAVENOUS

## 2019-11-17 MED ORDER — IBUPROFEN 800 MG PO TABS: 800.0000 mg | ORAL_TABLET | Freq: Three times a day (TID) | ORAL | 0 refills | Status: DC | PRN

## 2019-11-17 MED ORDER — TECHNETIUM TC 99M SULFUR COLLOID FILTERED
1.0000 | Freq: Once | INTRAVENOUS | Status: AC | PRN
  Administered 2019-11-17: 1 via INTRADERMAL

## 2019-11-17 MED ORDER — MEPERIDINE HCL 25 MG/ML IJ SOLN: 6.2500 mg | INTRAMUSCULAR | Status: DC | PRN

## 2019-11-17 MED ORDER — ONDANSETRON HCL 4 MG/2ML IJ SOLN: 4.0000 mg | Freq: Once | INTRAMUSCULAR | Status: DC | PRN

## 2019-11-17 MED ORDER — MIDAZOLAM HCL 2 MG/2ML IJ SOLN
INTRAMUSCULAR | Status: AC
  Filled 2019-11-17: qty 2

## 2019-11-17 MED ORDER — METHYLENE BLUE 0.5 % INJ SOLN
INTRAVENOUS | Status: AC
  Filled 2019-11-17: qty 10

## 2019-11-17 MED ORDER — PROPOFOL 10 MG/ML IV BOLUS
INTRAVENOUS | Status: AC
  Filled 2019-11-17: qty 20

## 2019-11-17 MED ORDER — CHLORHEXIDINE GLUCONATE 0.12 % MT SOLN
15.0000 mL | Freq: Once | OROMUCOSAL | Status: AC
  Administered 2019-11-17: 15 mL via OROMUCOSAL
  Filled 2019-11-17: qty 15

## 2019-11-17 MED ORDER — HEMOSTATIC AGENTS (NO CHARGE) OPTIME
TOPICAL | Status: DC | PRN
  Administered 2019-11-17: 1 via TOPICAL

## 2019-11-17 MED ORDER — EPHEDRINE SULFATE-NACL 50-0.9 MG/10ML-% IV SOSY
PREFILLED_SYRINGE | INTRAVENOUS | Status: DC | PRN
  Administered 2019-11-17: 5 mg via INTRAVENOUS

## 2019-11-17 MED ORDER — PROPOFOL 10 MG/ML IV BOLUS
INTRAVENOUS | Status: DC | PRN
  Administered 2019-11-17: 120 mg via INTRAVENOUS

## 2019-11-17 MED ORDER — LIDOCAINE 2% (20 MG/ML) 5 ML SYRINGE
INTRAMUSCULAR | Status: AC
  Filled 2019-11-17: qty 5

## 2019-11-17 MED ORDER — LIDOCAINE HCL (CARDIAC) PF 100 MG/5ML IV SOSY
PREFILLED_SYRINGE | INTRAVENOUS | Status: DC | PRN
  Administered 2019-11-17: 100 mg via INTRATRACHEAL

## 2019-11-17 MED ORDER — OXYCODONE HCL 5 MG PO TABS: 5.0000 mg | ORAL_TABLET | Freq: Once | ORAL | Status: DC | PRN

## 2019-11-17 MED ORDER — DEXAMETHASONE SODIUM PHOSPHATE 10 MG/ML IJ SOLN
INTRAMUSCULAR | Status: DC | PRN
  Administered 2019-11-17: 4 mg via INTRAVENOUS

## 2019-11-17 MED ORDER — MIDAZOLAM HCL 2 MG/2ML IJ SOLN
INTRAMUSCULAR | Status: DC | PRN
  Administered 2019-11-17: 2 mg via INTRAVENOUS

## 2019-11-17 SURGICAL SUPPLY — 43 items
APPLIER CLIP 9.375 MED OPEN (MISCELLANEOUS) ×4
BINDER BREAST LRG (GAUZE/BANDAGES/DRESSINGS) IMPLANT
BINDER BREAST XLRG (GAUZE/BANDAGES/DRESSINGS) ×2 IMPLANT
CANISTER SUCT 3000ML PPV (MISCELLANEOUS) ×4 IMPLANT
CHLORAPREP W/TINT 26 (MISCELLANEOUS) ×4 IMPLANT
CLIP APPLIE 9.375 MED OPEN (MISCELLANEOUS) ×2 IMPLANT
CNTNR URN SCR LID CUP LEK RST (MISCELLANEOUS) ×2 IMPLANT
CONT SPEC 4OZ STRL OR WHT (MISCELLANEOUS) ×2
COVER PROBE W GEL 5X96 (DRAPES) ×4 IMPLANT
COVER SURGICAL LIGHT HANDLE (MISCELLANEOUS) ×4 IMPLANT
COVER WAND RF STERILE (DRAPES) IMPLANT
DERMABOND ADVANCED (GAUZE/BANDAGES/DRESSINGS) ×2
DERMABOND ADVANCED .7 DNX12 (GAUZE/BANDAGES/DRESSINGS) ×2 IMPLANT
DEVICE DUBIN SPECIMEN MAMMOGRA (MISCELLANEOUS) ×4 IMPLANT
DRAPE CHEST BREAST 15X10 FENES (DRAPES) ×4 IMPLANT
ELECT CAUTERY BLADE 6.4 (BLADE) ×4 IMPLANT
ELECT REM PT RETURN 9FT ADLT (ELECTROSURGICAL) ×4
ELECTRODE REM PT RTRN 9FT ADLT (ELECTROSURGICAL) ×2 IMPLANT
GLOVE BIO SURGEON STRL SZ8 (GLOVE) ×4 IMPLANT
GLOVE BIOGEL PI IND STRL 8 (GLOVE) ×2 IMPLANT
GLOVE BIOGEL PI INDICATOR 8 (GLOVE) ×2
GOWN STRL REUS W/ TWL LRG LVL3 (GOWN DISPOSABLE) ×2 IMPLANT
GOWN STRL REUS W/ TWL XL LVL3 (GOWN DISPOSABLE) ×2 IMPLANT
GOWN STRL REUS W/TWL LRG LVL3 (GOWN DISPOSABLE) ×2
GOWN STRL REUS W/TWL XL LVL3 (GOWN DISPOSABLE) ×2
HEMOSTAT HEMOBLAST BELLOWS (HEMOSTASIS) ×2 IMPLANT
KIT BASIN OR (CUSTOM PROCEDURE TRAY) ×4 IMPLANT
KIT MARKER MARGIN INK (KITS) ×4 IMPLANT
LIGHT WAVEGUIDE WIDE FLAT (MISCELLANEOUS) IMPLANT
NDL 18GX1X1/2 (RX/OR ONLY) (NEEDLE) IMPLANT
NDL FILTER BLUNT 18X1 1/2 (NEEDLE) IMPLANT
NDL HYPO 25GX1X1/2 BEV (NEEDLE) ×2 IMPLANT
NEEDLE 18GX1X1/2 (RX/OR ONLY) (NEEDLE) IMPLANT
NEEDLE FILTER BLUNT 18X 1/2SAF (NEEDLE)
NEEDLE FILTER BLUNT 18X1 1/2 (NEEDLE) IMPLANT
NEEDLE HYPO 25GX1X1/2 BEV (NEEDLE) ×4 IMPLANT
NS IRRIG 1000ML POUR BTL (IV SOLUTION) ×4 IMPLANT
PACK GENERAL/GYN (CUSTOM PROCEDURE TRAY) ×4 IMPLANT
SUT MNCRL AB 4-0 PS2 18 (SUTURE) ×4 IMPLANT
SUT VIC AB 3-0 SH 18 (SUTURE) ×4 IMPLANT
SYR CONTROL 10ML LL (SYRINGE) ×4 IMPLANT
TOWEL GREEN STERILE (TOWEL DISPOSABLE) ×4 IMPLANT
TOWEL GREEN STERILE FF (TOWEL DISPOSABLE) ×4 IMPLANT

## 2019-11-17 NOTE — Anesthesia Procedure Notes (Signed)
Procedure Name: LMA Insertion Date/Time: 11/17/2019 7:39 AM Performed by: Myna Bright, CRNA Pre-anesthesia Checklist: Patient identified, Emergency Drugs available, Suction available and Patient being monitored Patient Re-evaluated:Patient Re-evaluated prior to induction Oxygen Delivery Method: Circle System Utilized Preoxygenation: Pre-oxygenation with 100% oxygen Induction Type: IV induction Ventilation: Mask ventilation without difficulty LMA: LMA inserted LMA Size: 4.0 Number of attempts: 1 Placement Confirmation: positive ETCO2 Tube secured with: Tape Dental Injury: Teeth and Oropharynx as per pre-operative assessment  Comments: Placed by D. [place], [place]

## 2019-11-17 NOTE — Op Note (Signed)
Preop diagnosis: Stage II right breast cancer lower outer quadrant status post neoadjuvant chemotherapy  Postoperative diagnosis: Same  Procedure: Right breast seed lumpectomy with right axillary sentinel lymph node mapping using methylene blue dye  Surgeon: Erroll Luna, MD  Anesthesia: LMA with pectoral block and 0.25% Marcaine plain  EBL: 20 cc  Specimen: Right breast tissue with seed and clip verified by Faxitron and right axillary sentinel nodes hot blue x5  Drains: None  IV fluids: Per anesthesia record  Indications for procedure: The patient presents for right breast lumpectomy after neoadjuvant chemotherapy. She was seen in the multidisciplinary clinic and with the help of translator care was discussed. She did not want bilateral mastectomies given her BRCA positivity after lengthy discussion with her. She opted for breast surgery realizing her young age she will more than likely breast cancer during her lifetime. Despite the discussion she opted for breast conserving surgery.The procedure has been discussed with the patient. Alternatives to surgery have been discussed with the patient.  Risks of surgery include bleeding,  Infection,  Seroma formation, death,  and the need for further surgery.   The patient understands and wishes to proceed.Sentinel lymph node mapping and dissection has been discussed with the patient.  Risk of bleeding,  Infection,  Seroma formation,  Additional procedures,,  Shoulder weakness ,  Shoulder stiffness,  Nerve and blood vessel injury and reaction to the mapping dyes have been discussed.  Alternatives to surgery have been discussed with the patient.  The patient agrees to proceed.  Description of procedure: The patient was met in the holding area. Translator available for communication. Questions were answered. Right breast was marked as the correct side and seed verified by neoprobe. She underwent pectoral block anesthesia. She was brought back to the  operating. She is placed on the OR table. After induction of general esthesia the right breast was prepped draped sterile fashion timeout performed. Neoprobe used to localize seed in the right lower outer quadrant. Local anesthetic was entered. Incision was made in the lower pole right breast. Dissection carried down and all tissue and the seed and clip were excised with a grossly negative margin. Faxitron revealed seed and clip to be in the specimen. Hemostasis achieved. Local anesthetic infiltrated into the cavity and the cavity closed with 3-0 Vicryl and 4-0 Monocryl.  4 cc methylene blue dye were injected in the subareolar position. Massaged for 5 minutes was performed. Neoprobe was used to identify hotspot in right axilla on the technetium settings. Incision made in the right axilla. Dissection carried down to the level 1 lymph node basin. There are total of 5 hot and blue sentinel nodes removed. Background counts approached 0. Long thoracic nerve, axillary vein trunk were all preserved. Hemostasis achieved with Hemoblast and cautery. Wound closed with 3-0 Vicryl Monocryl. Dermabond applied to both incisions. Breast binder placed. All counts were correct. The patient was awoke extubated taken to recovery in satisfactory condition.

## 2019-11-17 NOTE — Anesthesia Postprocedure Evaluation (Signed)
Anesthesia Post Note  Patient: Brianna Owens  Procedure(s) Performed: RIGHT BREAST LUMPECTOMY WITH RADIOACTIVE SEED (Right Breast) Sentinel Node Biopsy (Right Axilla)     Patient location during evaluation: PACU Anesthesia Type: General Level of consciousness: awake and alert Pain management: pain level controlled Vital Signs Assessment: post-procedure vital signs reviewed and stable Respiratory status: spontaneous breathing, nonlabored ventilation, respiratory function stable and patient connected to nasal cannula oxygen Cardiovascular status: blood pressure returned to baseline and stable Postop Assessment: no apparent nausea or vomiting Anesthetic complications: no    Last Vitals:  Vitals:   11/17/19 1000 11/17/19 1010  BP: 99/68 103/67  Pulse: 74   Resp: 16   Temp:    SpO2: 99%     Last Pain:  Vitals:   11/17/19 0945  TempSrc:   PainSc: 0-No pain                 Barnet Glasgow

## 2019-11-17 NOTE — Anesthesia Procedure Notes (Addendum)
Anesthesia Regional Block: Pectoralis block   Pre-Anesthetic Checklist: ,, timeout performed, Correct Patient, Correct Site, Correct Laterality, Correct Procedure, Correct Position, site marked, Risks and benefits discussed,  Surgical consent,  Pre-op evaluation,  At surgeon's request and post-op pain management  Laterality: Right  Prep: chloraprep       Needles:  Injection technique: Single-shot  Needle Type: Echogenic Needle     Needle Length: 9cm  Needle Gauge: 21     Additional Needles:   Procedures:,,,, ultrasound used (permanent image in chart),,,,  Narrative:  Start time: 11/17/2019 7:10 AM End time: 11/17/2019 7:18 AM Injection made incrementally with aspirations every 5 mL.  Performed by: Personally  Anesthesiologist: Barnet Glasgow, MD  Additional Notes: Block assessed. Patient tolerated procedure well.

## 2019-11-17 NOTE — Anesthesia Postprocedure Evaluation (Signed)
Anesthesia Post Note  Patient: [place]  Procedure(s) Performed: RIGHT BREAST LUMPECTOMY WITH RADIOACTIVE SEED (Right Breast) Sentinel Node Biopsy (Right Axilla)     Patient location during evaluation: PACU Anesthesia Type: General Level of consciousness: awake and alert Pain management: pain level controlled Vital Signs Assessment: post-procedure vital signs reviewed and stable Respiratory status: spontaneous breathing, nonlabored ventilation, respiratory function stable and patient connected to nasal cannula oxygen Cardiovascular status: blood pressure returned to baseline and stable Postop Assessment: no apparent nausea or vomiting Anesthetic complications: no    Last Vitals:  Vitals:   11/17/19 1000 11/17/19 1010  BP: 99/68 103/67  Pulse: 74   Resp: 16   Temp:    SpO2: 99%     Last Pain:  Vitals:   11/17/19 0945  TempSrc:   PainSc: 0-No pain                 Barnet Glasgow

## 2019-11-17 NOTE — Transfer of Care (Signed)
Immediate Anesthesia Transfer of Care Note  Patient: [place]  Procedure(s) Performed: RIGHT BREAST LUMPECTOMY WITH RADIOACTIVE SEED (Right Breast) Sentinel Node Biopsy (Right Axilla)  Patient Location: PACU  Anesthesia Type:GA combined with regional for post-op pain  Level of Consciousness: awake, alert , oriented and patient cooperative  Airway & Oxygen Therapy: Patient Spontanous Breathing and Patient connected to nasal cannula oxygen  Post-op Assessment: Report given to RN, Post -op Vital signs reviewed and stable and Patient moving all extremities  Post vital signs: Reviewed and stable  Last Vitals:  Vitals Value Taken Time  BP    Temp    Pulse    Resp    SpO2      Last Pain:  Vitals:   11/17/19 0624  TempSrc: Oral  PainSc:       Patients Stated Pain Goal: 7 ([id number])  Complications: No apparent anesthesia complications

## 2019-11-17 NOTE — Progress Notes (Signed)
Interpretor at bedside for duration of recovery and phase 2. Interpretor assisting with discharge instructions.

## 2019-11-17 NOTE — Discharge Instructions (Signed)
Anestesia general en adultos, cuidados posteriores General Anesthesia, Adult, Care After [place] esta informacin sobre cmo cuidarse despus del procedimiento. El mdico tambin podr darle instrucciones ms especficas. Comunquese con su mdico si tiene problemas o preguntas. Qu puedo esperar despus del procedimiento? Luego del procedimiento, son comunes los siguiente efectos secundarios:  Dolor o molestias en el lugar de la va intravenosa (i.v.).  Nuseas.  Vmitos.  Dolor de garganta.  Dificultad para concentrarse.  Sentir fro o tener escalofros.  Debilidad o cansancio.  Somnolencia y fatiga.  Malestar y dolor corporal. Estos efectos secundarios pueden afectar partes del cuerpo que no estuvieron involucradas en la ciruga. Siga estas indicaciones en su casa:  Durante al menos 24horas despus del procedimiento:  Pdale a un adulto responsable que permanezca con usted. Es importante que alguien cuide de usted hasta que se despierte y est alerta.  Descanse todo lo que sea necesario.  No haga lo siguiente: ? Participar en actividades en las que podra caerse o lastimarse. ? Conducir. ? Operar maquinarias pesadas. ? Beber alcohol. ? Tomar somnferos o medicamentos que causen somnolencia. ? Firmar documentos legales ni tomar decisiones importantes. ? Cuidar a nios por su cuenta. Qu debe comer y beber  Siga las indicaciones del mdico respecto de las restricciones de comidas o bebidas.  Cuando tenga hambre, comience a comer cantidades pequeas de alimentos que sean blandos y fciles de digerir (livianos), como una tostada. Retome su dieta habitual de forma gradual.  Beba suficiente lquido como para mantener la orina de color amarillo plido.  Si vomita, rehidrtese tomando agua, jugo o caldo transparente. Instrucciones generales  Si tiene apnea del sueo, la ciruga y ciertos medicamentos pueden aumentar el riesgo de problemas respiratorios. Siga las  indicaciones del mdico respecto al uso de su dispositivo para dormir: ? Siempre que duerma, incluso durante las siestas que tome en el da. ? Mientras tome analgsicos recetados, medicamentos para dormir o medicamentos que producen somnolencia.  Reanude sus actividades normales segn lo indicado por el mdico. Pregntele al mdico qu actividades son seguras para usted.  Tome los medicamentos de venta libre y los recetados solamente como se lo haya indicado el mdico.  Si fuma, no lo haga sin supervisin.  Concurra a todas las visitas de seguimiento como se lo haya indicado el mdico. Esto es importante. Comunquese con un mdico si:  Tiene nuseas o vmitos que no mejoran con medicamentos.  No puede comer ni beber sin vomitar.  El dolor no se alivia con medicamentos.  No puede orinar.  Tiene una erupcin cutnea.  Tiene fiebre.  Presenta enrojecimiento alrededor del lugar de la va intravenosa (i.v.) que empeora. Solicite ayuda de inmediato si:  Tiene dificultad para respirar.  Siente dolor en el pecho.  Observa sangre en la orina o heces, o vomita sangre. Resumen  Despus del procedimiento, es comn tener dolor de garganta y nuseas. Tambin es comn sentirse cansado.  Pdale a un adulto responsable que permanezca con usted durante 24 horas despus de la anestesia general. Es importante que alguien cuide de usted hasta que se despierte y est alerta.  Cuando tenga hambre, comience a comer cantidades pequeas de alimentos que sean blandos y fciles de digerir (livianos), como una tostada. Retome su dieta habitual de forma gradual.  Beba suficiente lquido como para mantener la orina de color amarillo plido.  Reanude sus actividades normales segn lo indicado por el mdico. Pregntele al mdico qu actividades son seguras para usted. Esta informacin no tiene como fin reemplazar   el consejo del mdico. Asegrese de hacerle al mdico cualquier pregunta que tenga. Document  Revised: 03/30/2017 Document Reviewed: 03/30/2017 Elsevier Patient Education  [place], cuidados posteriores Lumpectomy, Care After [employer] brinda informacin sobre cmo cuidarse despus del procedimiento. Su mdico tambin podr darle instrucciones ms especficas. Comunquese con el mdico si tiene problemas o preguntas. Qu puedo esperar despus del procedimiento? Despus del procedimiento, es comn [employer] siguientes sntomas:  Hinchazn de las [place].  Dolor a Radiographer, therapeutic.  Rigidez en el brazo o el hombro.  Cambio en la forma y la sensibilidad de las [place].  Tejido cicatricial que se siente duro al tacto en la zona donde se extrajo el ndulo. Siga estas instrucciones en su casa: Medicamentos  [employer] de venta libre y los recetados solamente como se lo haya indicado el mdico.  Si le recetaron un antibitico, tmelo como se lo haya indicado el mdico. No deje de tomar los antibiticos aunque comience a Sports administrator.  Pregntele al mdico si el medicamento recetado: ? Hace necesario que evite conducir o usar maquinaria pesada. ? Puede causarle estreimiento. Es posible que tenga que tomar estas medidas para prevenir o tratar el estreimiento:  Beba suficiente lquido como para Theatre manager la orina de color amarillo plido.  Tome medicamentos recetados o de venta [place].  Consuma alimentos ricos en fibra, como frijoles, cereales integrales, y frutas y verduras frescas.  Limite el consumo de alimentos ricos en grasa y azcares procesados, como alimentos fritos o dulces. Cuidados de la incisin      Siga las instrucciones del mdico acerca del cuidado de la incisin. Asegrese de hacer lo siguiente: ? Lvese las manos con agua y [place] antes y despus de cambiar la venda (vendaje). Use desinfectante para manos si no dispone de [place] y [place]. ? Cambie el vendaje como se lo haya indicado el mdico. ? No retire los puntos  (suturas), la goma para cerrar la piel o las tiras [place]. Es posible que estos cierres cutneos deban quedar puestos en la piel durante 2semanas o ms tiempo. Si los bordes de las tiras adhesivas empiezan a despegarse y Therapist, sports, puede recortar los que estn sueltos. No retire las tiras [facility] por completo a menos que el mdico se lo indique.  Controle la zona de la incisin todos los das para detectar signos de infeccin. Est atenta a los siguientes signos: ? Aumento del enrojecimiento, la hinchazn o Conservation officer, historic buildings. ? Lquido o sangre. ? Calor. ? Pus o mal olor.  Mantenga el vendaje limpio y seco.  Si la enviaron de regreso a su casa con un drenaje quirrgico colocado, siga las indicaciones del mdico sobre cmo vaciarlo. Baos  No tome baos de inmersin, no nade ni use el jacuzzi hasta que el mdico la autorice.  Pregntele al mdico si puede ducharse. Thurston Pounds solo le permitan darse baos de [place]. Actividad  Haga reposo como se lo haya indicado el mdico.  Evite estar sentada durante largos perodos sin moverse. Levntese y camine un poco cada 1 a 2 horas. Esto es importante para mejorar el flujo sanguneo y la respiracin. Pida ayuda si se siente dbil o inestable.  Retome sus actividades normales segn lo indicado por el mdico. Pregntele al mdico qu actividades son seguras para usted.  Evite cualquier actividad que pueda causarle una lesin en el brazo que est del lado de la [place].  No levante ningn objeto que pese ms de 10libras (4,5kg) o el  lmite de [place] hayan indicado, hasta que el mdico le diga que puede [place]. Evite levantar objetos con el brazo que est del lado de la [place].  No cargue objetos pesados sobre el hombro del lado de la [place].  Haga ejercicios para evitar que el hombro y el brazo se pongan rgidos y se hinchen. Consulte al [employer] tipos de ejercicios que son seguros para usted. Instrucciones generales  Use un sostn  de soporte como se lo haya indicado su mdico.  Cuando est sentada o acostada, levante (eleve) el brazo por encima del nivel del corazn.  No use anillos, pulseras ni otros accesorios ajustados en el brazo, la [place] o los dedos del lado de la [place].  Concurra a todas las visitas de seguimiento como se lo haya indicado el mdico. Esto es importante. ? Probablemente necesite que le hagan un estudio para determinar la presencia de ms lquido alrededor de los ganglios linfticos e hinchazn en la mama y el brazo (linfedema). Siga las instrucciones del mdico acerca de la frecuencia con la que se debe hacer los controles.  Si le han extrado algn ganglio linftico durante el procedimiento, asegrese de darles toda la informacin a sus mdicos. Esta es una informacin importante que se debe compartir antes de ciertos procedimientos, como anlisis de [place] o medicin de presin arterial. Comunquese con un mdico si:  Presenta una erupcin cutnea.  Tiene fiebre.  Los analgsicos no [employer].  Tiene hinchazn, debilidad o adormecimiento en el brazo que no mejora despus de unas semanas.  Tiene una hinchazn nueva en la mama.  Tiene cualquiera de estos signos de infeccin: ? Ms enrojecimiento, hinchazn o dolor en la zona de la incisin. ? Lquido o sangre que salen de la incisin. ? Calor que proviene de la zona de la incisin. ? Pus o mal olor en TEFL teacher de la incisin. Solicite ayuda inmediatamente si tiene:  Location manager intenso en la mama o el brazo.  Hinchazn en las piernas o los brazos.  Enrojecimiento, calor o dolor en las piernas o los brazos.  Dolor de pecho.  Dificultad para respirar. Resumen  Despus del procedimiento, es comn tener sensibilidad al tacto e hinchazn en la mama, y rigidez en el brazo y [place].  Siga las instrucciones del mdico acerca del cuidado de la incisin.  No levante ningn objeto que pese ms de 10libras (4,5kg) o el lmite  de peso que le hayan indicado, hasta que el mdico le diga que puede [place]. Evite levantar objetos con el brazo que est del lado de la [place].  Si le han extrado algn ganglio linftico durante el procedimiento, asegrese de darles toda la informacin a sus mdicos. Esta es una informacin importante que se debe compartir antes de ciertos procedimientos, como anlisis de [place] o medicin de presin arterial. Esta informacin no tiene Marine scientist el consejo del mdico. Asegrese de hacerle al mdico cualquier pregunta que tenga. Document Revised: 01/19/2019 Document Reviewed: 01/19/2019 Elsevier Patient Education  [place].

## 2019-11-17 NOTE — H&P (Signed)
[place] Appointment:Location: [facility] Patient #: [id number] DOB: [birth year] Unknown / Language: Undefined / Race: Undefined Female  History of Present Illness Patient words: Patient returns after completion of neoadjuvant chemotherapy for a T3 N0 right breast cancer. Repeat magnetic resonance imaging shows an almost complete pathological response. Her initial biopsy of lymph node was benign prior to initiation of chemotherapy. A translator is available since she speaks no [place]. She has no complaints and has done well with chemotherapy.  The patient is a 29 year old female.   Allergies No Known Drug Allergies [05/06/2019]: Allergies Reconciled  Medication History No Current Medications Medications Reconciled    Vitals  10/10/2019 2:55 PM Weight: 162.38 lb Height: 62in Body Surface Area: 1.75 m Body Mass Index: 29.7 kg/m  Temp.: 98.5F  Pulse: 104 (Regular)         Physical Exam  General Mental Status-Alert. General Appearance-Consistent with stated age. Hydration-Well hydrated. Voice-Normal.  Neurologic Neurologic evaluation reveals -alert and oriented x 3 with no impairment of recent or remote memory. Mental Status-Normal.    Assessment & Plan  RIGHT BREAST CANCER WITH T3 TUMOR, >5 CM IN GREATEST DIMENSION (C50.911) Impression: BRCA 1  Discussed treatment options with the help of a translator. I recommended initially given her  were BRCA1 positivity,  bilateral mastectomy and reconstruction and sentinel lymph node mapping. She does not wish to undergo bilateral mastectomies at  this point time and desires lumpectomy with sentinel lymph node mapping on the right. I discussed potential recurrence rates with her which will be extremely high given her young age and BRCA1 positivity : she understands this. I discussed the need for yearly magnetic resonance imagings to follow her  closely and to expect either a new cancer recurrence of her cancer with the next 10-15 years. She understands this but still does not wish to undergo bilateral mastectomy at this time understanding the risk. We'll proceed with right breast seed lumpectomy with right axillary sentinel lymph node mapping. Port will stay in for now until final pathology reviewed. Risk of lumpectomy include bleeding, infection, seroma, more surgery, use of seed/wire, wound care, cosmetic deformity and the need for other treatments, death , blood clots, death. Pt agrees to proceed. Risk of sentinel lymph node mapping include bleeding, infection, lymphedema, shoulder pain. stiffness, dye allergy. cosmetic deformity , blood clots, death, need for more surgery. Pt agrees to proceed.  Current Plans You are being scheduled for surgery- Our schedulers will call you.  You should hear from our office's scheduling department within 5 working days about the location, date, and time of surgery. We try to make accommodations for patient's preferences in scheduling surgery, but sometimes the OR schedule or the surgeon's schedule prevents [place] from making those accommodations.  If you have not heard from our office [phone]) in 5 working days, call the office and ask for your surgeon's nurse.  If you have other questions about your diagnosis, plan, or surgery, call the office and ask for your surgeon's nurse.  Pt Education - [facility] Breast Cancer Information Given - [facility] "Breast Journey" Package We discussed the staging and pathophysiology of breast cancer. We discussed all of the different options for treatment for breast cancer including surgery, chemotherapy, radiation therapy, Herceptin, and antiestrogen therapy. We discussed a sentinel lymph node biopsy as she does not appear to having lymph node involvement right now. We discussed the performance of that with injection of radioactive tracer and blue dye. We discussed that  she would  have an incision underneath her axillary hairline. We discussed that there is a bout a 10-20% chance of having a positive node with a sentinel lymph node biopsy and we will await the permanent pathology to make any other first further decisions in terms of her treatment. One of these options might be to return to the operating room to perform an axillary lymph node dissection. We discussed about a 1-2% risk lifetime of chronic shoulder pain as well as lymphedema associated with a sentinel lymph node biopsy. We discussed the options for treatment of the breast cancer which included lumpectomy versus a mastectomy. We discussed the performance of the lumpectomy with a wire placement. We discussed a 10-20% chance of a positive margin requiring reexcision in the operating room. We also discussed that she may need radiation therapy or antiestrogen therapy or both if she undergoes lumpectomy. We discussed the mastectomy and the postoperative care for that as well. We discussed that there is no difference in her survival whether she undergoes lumpectomy with radiation therapy or antiestrogen therapy versus a mastectomy. There is a slight difference in the local recurrence rate being 3-5% with lumpectomy and about 1% with a mastectomy. We discussed the risks of operation including bleeding, infection, possible reoperation. She understands her further therapy will be based on what her stages at the time of her operation.  Pt Education - [facility] breast cancer surgery: discussed with patient and provided information. Pt Education - ABC (After Breast Cancer) Class Info: discussed with patient and provided information.

## 2019-11-21 LAB — SURGICAL PATHOLOGY

## 2019-11-22 ENCOUNTER — Encounter: Payer: Self-pay | Admitting: *Deleted

## 2019-11-22 DIAGNOSIS — C50811 Malignant neoplasm of overlapping sites of right female breast: Secondary | ICD-10-CM

## 2019-11-22 DIAGNOSIS — Z95828 Presence of other vascular implants and grafts: Secondary | ICD-10-CM

## 2019-11-23 NOTE — Progress Notes (Signed)
Brianna Owens  [phone] [phone]     ID: Brianna Owens DOB: 1991-02-09  MR#: [id number]  TXH#:[id number]  Patient Care Team: Patient, [name] as PCP - General (General Practice) Brianna Owens, Brianna Dad, MD as Consulting Physician (Oncology) Brianna Luna, MD as Consulting Physician (General Surgery) Brianna Kaufmann, RN as Oncology Nurse Navigator [name], RN as Oncology Nurse Navigator Chauncey Cruel, MD OTHER MD:  CHIEF COMPLAINT: Triple negative breast cancer, BRCA1 positive  CURRENT TREATMENT: Adjuvant radiation pending; continuing Luprolide   INTERVAL HISTORY: Brianna Owens returns today for follow up and treatment of her triple negative breast cancer.  She is accompanied by Brianna Owens our in person Spanish interpreter.     Since her last visit, she underwent right lumpectomy on 11/17/2019 under Dr. Brantley Stage. Pathology from the procedure [id number]) showed: no residual carcinoma in the breast or lymph nodes.  A total of 5 axillary lymph nodes were removed.  All 5 biopsied lymph nodes were negative for carcinoma (0/5).  She has not yet been scheduled for follow-up with radiation oncology.  Her most recent goserelin treatment was 10/14/2019   REVIEW OF SYSTEMS: Mandi tolerated surgery remarkably well.  She still has some pain.  She has taken her pain medicines and is currently using ibuprofen.  She has numbness in the axilla particularly.  She had no bleeding or fever or other complications.  She has not yet been contacted by radiation.  She did not get a Lupron shot in May for some reason.  She will receive it today.  A detailed review of systems was otherwise stable.    HISTORY OF CURRENT ILLNESS: From the original intake note:  Brianna Owens [facility] with a 3 month history of a right breast lump that became painful in early 04/2019. Physical exam  performed at that time showed a palpable, tender 13 cm lump within the right center breast under the nipple area. She underwent right breast ultrasonography at [facility] on 04/27/2019 showing: 6.1 cm palpable mass centered in the 5 o'clock position of the right breast; single right inferomedial axillary lymph node with focal cortical thickening inferiorly.  Accordingly on 05/04/2019 she proceeded to biopsy of the right breast area in question. The pathology from this procedure ([id number]) showed: invasive ductal carcinoma, grade 2-3. Prognostic indicators significant for: estrogen receptor, 0% negative and progesterone receptor, 0% negative. Proliferation marker Ki67 at 40%.  I do not find HER-2 receptor documentation  The right axillary lymph node biopsied at that time showed reactive germinal centers.  She underwent bilateral diagnostic mammography with tomography at [facility] on 05/05/2019 showing: breast density category D; biopsy-proven right breast malignancy measures 6.1 cm; no additional masses or calcifications seen in the right breast; no evidence of malignancy in the left breast.  The patient's subsequent history is as detailed below.   PAST MEDICAL HISTORY: Past Medical History:  Diagnosis Date   Cancer ([place]) 05/05/2019    PAST SURGICAL HISTORY: Past Surgical History:  Procedure Laterality Date   BREAST LUMPECTOMY WITH RADIOACTIVE SEED AND SENTINEL LYMPH NODE BIOPSY Right 11/17/2019   Procedure: RIGHT BREAST LUMPECTOMY WITH RADIOACTIVE SEED;  Surgeon: Brianna Luna, MD;  Location: [facility];  Service: General;  Laterality: Right;  PEC BLOCK   IR IMAGING GUIDED PORT INSERTION  05/24/2019   SENTINEL NODE BIOPSY Right 11/17/2019   Procedure: Sentinel Node Biopsy;  Surgeon: Brianna Luna, MD;  Location: [facility];  Service: General;  Laterality: Right;    FAMILY HISTORY: Family History  Problem Relation Age of Onset   Breast cancer Mother    Breast cancer Maternal  Aunt    Patient's father is 61 and her mother 41 as of November 2020.  The patient's mother was diagnosed with breast cancer in her early 13s.  She lives in [place]. The patient's mother's sister was also diagnosed with breast cancer in her early 63s.  The patient herself has 1 sister, no brothers.  She is not aware of any ovarian cancer cases in the family.   GYNECOLOGIC HISTORY:  No LMP recorded. (Menstrual status: Irregular Periods). Menarche: 29 years old Age at first live birth: 29 years old [place] P 2 LMP regular Contraceptive HRT n/a  Hysterectomy? no BSO? no   SOCIAL HISTORY: (updated 04/2019)  Ave is currently working at [employer].  She is originally from [place].  She is divorced.  Her children are Brianna Owens, 55 years old, living in [place] with his father, and Brianna Owens, 59 years old, who lives with the patient.  Also at home are her uncle Brianna Owens, his wife, and that wife's cousin.     ADVANCED DIRECTIVES: Not in place   HEALTH MAINTENANCE: Social History   Tobacco Use   Smoking status: Never Smoker   Smokeless tobacco: Never Used  Vaping Use   Vaping Use: Never used  Substance Use Topics   Alcohol use: Not Currently   Drug use: Not Currently     Colonoscopy: n/a  PAP: 04/26/2019, negative  Bone density: n/a   No Known Allergies  Current Outpatient Medications  Medication Sig Dispense Refill   ibuprofen (ADVIL) 800 MG tablet Take 1 tablet (800 mg total) by mouth every 8 (eight) hours as needed. 30 tablet 0   oxyCODONE (OXY IR/ROXICODONE) 5 MG immediate release tablet Take 1 tablet (5 mg total) by mouth every 6 (six) hours as needed for severe pain. 15 tablet 0   prochlorperazine (COMPAZINE) 10 MG tablet Take 10 mg by mouth every 6 (six) hours as needed for nausea or vomiting.     No current facility-administered medications for this visit.    OBJECTIVE:  Spanish speaker who appears stated age.  Vitals:   11/24/19 1333  BP: 120/64  Pulse: 90    Resp: 18  Temp: 99.1 F (37.3 C)  SpO2: 100%     Body mass index is 25.97 kg/m.   Wt Readings from Last 3 Encounters:  11/24/19 170 lb 12.8 oz (77.5 kg)  11/17/19 168 lb 1.6 oz (76.2 kg)  11/15/19 168 lb 1.6 oz (76.2 kg)      ECOG FS:1 - Symptomatic but completely ambulatory  Sclerae unicteric, EOMs intact Wearing a mask No cervical or supraclavicular adenopathy Lungs no rales or rhonchi Heart regular rate and rhythm Abd soft, nontender, positive bowel sounds MSK no focal spinal tenderness, no upper extremity lymphedema Neuro: nonfocal, well oriented, appropriate affect Breasts: The right breast is status post recent lumpectomy.  The cosmetic result is excellent.  The incision is not visible as it is inframammary.  There is no distortion of the breast contour.  The area of the rectocele is numb and there is a mild ecchymosis but otherwise it is unremarkable.  Left breast and left axilla are benign.   LAB RESULTS:  CMP     Component Value Date/Time   NA 140 11/15/2019 1156   K 3.7 11/15/2019 1156   CL 104 11/15/2019 1156  CO2 28 11/15/2019 1156   GLUCOSE 112 (H) 11/15/2019 1156   BUN 7 11/15/2019 1156   CREATININE 0.45 11/15/2019 1156   CREATININE 0.45 07/21/2019 1025   CALCIUM 9.9 11/15/2019 1156   PROT 7.8 11/15/2019 1156   ALBUMIN 3.8 11/15/2019 1156   AST 102 (H) 11/15/2019 1156   AST 29 07/21/2019 1025   ALT 209 (H) 11/15/2019 1156   ALT 58 (H) 07/21/2019 1025   ALKPHOS 87 11/15/2019 1156   BILITOT 0.5 11/15/2019 1156   BILITOT 0.3 07/21/2019 1025   GFRNONAA >60 11/15/2019 1156   GFRNONAA >60 07/21/2019 1025   GFRAA >60 11/15/2019 1156   GFRAA >60 07/21/2019 1025    No results found for: TOTALPROTELP, ALBUMINELP, A1GS, A2GS, BETS, BETA2SER, GAMS, MSPIKE, SPEI  No results found for: KPAFRELGTCHN, LAMBDASER, KAPLAMBRATIO  Lab Results  Component Value Date   WBC 6.0 11/24/2019   NEUTROABS 3.5 11/24/2019   HGB 10.9 (L) 11/24/2019   HCT 32.5 (L)  11/24/2019   MCV 96.7 11/24/2019   PLT 210 11/24/2019    No results found for: LABCA2  No components found for: NIOEVO350  No results for input(s): INR in the last 168 hours.  No results found for: LABCA2  No results found for: KXF818  No results found for: EXH371  No results found for: IRC789  No results found for: CA2729  No components found for: HGQUANT  No results found for: CEA1 / No results found for: CEA1   No results found for: AFPTUMOR  No results found for: CHROMOGRNA  No results found for: HGBA, HGBA2QUANT, HGBFQUANT, HGBSQUAN (Hemoglobinopathy evaluation)   No results found for: LDH  No results found for: IRON, TIBC, IRONPCTSAT (Iron and TIBC)  No results found for: FERRITIN  Urinalysis No results found for: COLORURINE, APPEARANCEUR, LABSPEC, PHURINE, GLUCOSEU, HGBUR, BILIRUBINUR, KETONESUR, PROTEINUR, UROBILINOGEN, NITRITE, LEUKOCYTESUR   STUDIES: NM Sentinel Node Inj-No Rpt (Breast)  Result Date: 11/17/2019 Sulfur colloid was injected by the nuclear medicine technologist for melanoma sentinel node.   MM Breast Surgical Specimen  Result Date: 11/17/2019 CLINICAL DATA:  Specimen radiograph status post right breast lumpectomy. EXAM: SPECIMEN RADIOGRAPH OF THE RIGHT BREAST COMPARISON:  Previous exam(s). FINDINGS: Status post excision of the right breast. The radioactive seed and biopsy marker clip are present, completely intact, and were marked for pathology. These findings were communicated with the OR at 8:10 a.m. IMPRESSION: Specimen radiograph of the right breast. Electronically Signed   By: Ammie Ferrier M.D.   On: 11/17/2019 08:12   MM RT RADIOACTIVE SEED LOC MAMMO GUIDE  Result Date: 11/16/2019 CLINICAL DATA:  29 year old female for radioactive seed localization of RIGHT breast cancer. EXAM: MAMMOGRAPHIC GUIDED RADIOACTIVE SEED LOCALIZATION OF THE RIGHT BREAST COMPARISON:  Previous exam(s). FINDINGS: Patient presents for radioactive seed  localization prior to RIGHT lumpectomy. I met with the patient and we discussed the procedure of seed localization including benefits and alternatives. We discussed the high likelihood of a successful procedure. We discussed the risks of the procedure including infection, bleeding, tissue injury and further surgery. We discussed the low dose of radioactivity involved in the procedure. Informed, written consent was given. The usual time-out protocol was performed immediately prior to the procedure. Using mammographic guidance, sterile technique, 1% lidocaine and an I-125 radioactive seed, the Q clip was localized using a LATERAL approach. The follow-up mammogram images confirm the seed in the expected location and were marked for Dr. Brantley Stage. Follow-up survey of the patient confirms presence of the radioactive seed.  Order number of I-125 seed:  [id number]. Total activity:  1.607 millicuries.  Reference Date: 10/28/2019. The patient tolerated the procedure well and was released from the [facility]. She was given instructions regarding seed removal. IMPRESSION: Radioactive seed localization RIGHT breast. No apparent complications. Electronically Signed   By: Margarette Canada M.D.   On: 11/16/2019 13:27     ELIGIBLE FOR AVAILABLE RESEARCH PROTOCOL:no  ASSESSMENT: 29 y.o. BRCA1 positive [name] woman status post right breast overlapping sites biopsy 05/04/2019 for a clinical T3 N0, stage IIIB invasive ductal carcinoma, grade 2, triple negative, with an MIB-1 of 40%.  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  The Common Hereditary Gene Panel offered by [facility] includes sequencing and/or deletion duplication testing of the following 48 genes: APC, ATM, AXIN2, BARD1, BMPR1A, BRCA1, BRCA2, BRIP1, CDH1, CDK4, CDKN2A (p14ARF),  CDKN2A (p16INK4a), CHEK2, CTNNA1, DICER1, EPCAM (Deletion/duplication testing only), GREM1 (promoter region deletion/duplication testing only), KIT, MEN1, MLH1, MSH2, MSH3, MSH6, MUTYH, NBN, NF1, NHTL1, PALB2, PDGFRA, PMS2, POLD1, POLE, PTEN, RAD50, RAD51C, RAD51D, RNF43, SDHB, SDHC, SDHD, SMAD4, SMARCA4. STK11, TP53, TSC1, TSC2, and VHL.  The following genes were evaluated for sequence changes only: SDHA and HOXB13 c.251G>A variant only. The report date is 05/24/2019.  (b) patient recommended bilateral mastectomies, due to potential barriers/risks with age and intensified screening  (2) neoadjuvant chemotherapy consisting of doxorubicin and cyclophosphamide in dose dense fashion x4 started 05/26/2019, completed 07/07/2019, followed by paclitaxel and carboplatin weekly x12 started 07/21/2019  (a) breast MRI 09/26/2019 shows a complete radiologic response  (3) status post right lumpectomy 11/17/2019 showing a complete pathologic response [ypT0 ypN0]  (a) a total of 5 axillary lymph nodes were removed  (4) adjuvant radiation pending  (5) consider bilateral salpingo-oophorectomy at the end of breast cancer treatment  (a) receiving Lupron every 28 days   PLAN: Taliya at the best possible response to her neoadjuvant chemotherapy.  This does predict a good long-term result.  She is not ready for adjuvant radiation and we discussed some of the possible side effects of this treatment.  She understands she will need to be here on a daily basis for multiple weeks.  She will resume her Lupron today.  This is given every 28days.  I am hopeful at some point later this year she will agree to bilateral salpingo-oophorectomy.  Once she is done with radiation we will need to initiate intensified screening  She may be ready to remove her port after radiation.  Today she was not sure if she wanted to keep it or not  Total encounter time 30 minutes.[name] Jews C. Keelan Pomerleau, MD 11/24/19 1:44 PM Medical  Oncology and Hematology [facility] [place], [place] [address] Tel. [phone]    Fax. [phone]   I, Wilburn Mylar, am acting as scribe for Dr. Virgie Owens. Venise Ellingwood.  I, Lurline Del MD, have reviewed the above documentation for accuracy and completeness, and I agree with the above.    *Total Encounter Time as defined by the Centers for Medicare and Medicaid Services includes, in addition to the face-to-face time of a patient visit (documented in the note above) non-face-to-face time: obtaining and reviewing outside history, ordering and reviewing medications, tests or procedures, care coordination (communications with other health care professionals or caregivers) and documentation in the medical record.

## 2019-11-24 ENCOUNTER — Inpatient Hospital Stay: Payer: No Typology Code available for payment source

## 2019-11-24 ENCOUNTER — Inpatient Hospital Stay: Payer: Self-pay | Attending: Oncology | Admitting: Oncology

## 2019-11-24 ENCOUNTER — Other Ambulatory Visit: Payer: Self-pay

## 2019-11-24 VITALS — BP 120/64 | HR 90 | Temp 99.1°F | Resp 18 | Ht 68.0 in | Wt 170.8 lb

## 2019-11-24 DIAGNOSIS — Z1501 Genetic susceptibility to malignant neoplasm of breast: Secondary | ICD-10-CM | POA: Insufficient documentation

## 2019-11-24 DIAGNOSIS — Z171 Estrogen receptor negative status [ER-]: Secondary | ICD-10-CM

## 2019-11-24 DIAGNOSIS — Z9221 Personal history of antineoplastic chemotherapy: Secondary | ICD-10-CM | POA: Insufficient documentation

## 2019-11-24 DIAGNOSIS — Z803 Family history of malignant neoplasm of breast: Secondary | ICD-10-CM | POA: Insufficient documentation

## 2019-11-24 DIAGNOSIS — Z95828 Presence of other vascular implants and grafts: Secondary | ICD-10-CM

## 2019-11-24 DIAGNOSIS — Z8541 Personal history of malignant neoplasm of cervix uteri: Secondary | ICD-10-CM | POA: Insufficient documentation

## 2019-11-24 DIAGNOSIS — C50811 Malignant neoplasm of overlapping sites of right female breast: Secondary | ICD-10-CM | POA: Insufficient documentation

## 2019-11-24 DIAGNOSIS — Z01419 Encounter for gynecological examination (general) (routine) without abnormal findings: Secondary | ICD-10-CM

## 2019-11-24 DIAGNOSIS — Z1502 Genetic susceptibility to malignant neoplasm of ovary: Secondary | ICD-10-CM

## 2019-11-24 DIAGNOSIS — Z791 Long term (current) use of non-steroidal anti-inflammatories (NSAID): Secondary | ICD-10-CM | POA: Insufficient documentation

## 2019-11-24 LAB — CBC WITH DIFFERENTIAL/PLATELET
Abs Immature Granulocytes: 0.02 10*3/uL (ref 0.00–0.07)
Basophils Absolute: 0 10*3/uL (ref 0.0–0.1)
Basophils Relative: 0 %
Eosinophils Absolute: 0.2 10*3/uL (ref 0.0–0.5)
Eosinophils Relative: 3 %
HCT: 32.5 % — ABNORMAL LOW (ref 36.0–46.0)
Hemoglobin: 10.9 g/dL — ABNORMAL LOW (ref 12.0–15.0)
Immature Granulocytes: 0 %
Lymphocytes Relative: 27 %
Lymphs Abs: 1.6 10*3/uL (ref 0.7–4.0)
MCH: 32.4 pg (ref 26.0–34.0)
MCHC: 33.5 g/dL (ref 30.0–36.0)
MCV: 96.7 fL (ref 80.0–100.0)
Monocytes Absolute: 0.7 10*3/uL (ref 0.1–1.0)
Monocytes Relative: 11 %
Neutro Abs: 3.5 10*3/uL (ref 1.7–7.7)
Neutrophils Relative %: 59 %
Platelets: 210 10*3/uL (ref 150–400)
RBC: 3.36 MIL/uL — ABNORMAL LOW (ref 3.87–5.11)
RDW: 13.1 % (ref 11.5–15.5)
WBC: 6 10*3/uL (ref 4.0–10.5)
nRBC: 0 % (ref 0.0–0.2)

## 2019-11-24 LAB — COMPREHENSIVE METABOLIC PANEL
ALT: 196 U/L — ABNORMAL HIGH (ref 0–44)
AST: 83 U/L — ABNORMAL HIGH (ref 15–41)
Albumin: 3.8 g/dL (ref 3.5–5.0)
Alkaline Phosphatase: 110 U/L (ref 38–126)
Anion gap: 8 (ref 5–15)
BUN: 9 mg/dL (ref 6–20)
CO2: 27 mmol/L (ref 22–32)
Calcium: 9.5 mg/dL (ref 8.9–10.3)
Chloride: 102 mmol/L (ref 98–111)
Creatinine, Ser: 0.63 mg/dL (ref 0.44–1.00)
GFR calc Af Amer: >60 mL/min (ref >60)
GFR calc non Af Amer: >60 mL/min (ref >60)
Glucose, Bld: 107 mg/dL — ABNORMAL HIGH (ref 70–99)
Potassium: 3.8 mmol/L (ref 3.5–5.1)
Sodium: 137 mmol/L (ref 135–145)
Total Bilirubin: 0.4 mg/dL (ref 0.3–1.2)
Total Protein: 7.7 g/dL (ref 6.5–8.1)

## 2019-11-24 LAB — PREGNANCY, URINE: Preg Test, Ur: NEGATIVE

## 2019-11-24 MED ORDER — LEUPROLIDE ACETATE 3.75 MG IM KIT
3.7500 mg | PACK | Freq: Once | INTRAMUSCULAR | Status: AC
  Administered 2019-11-24: 3.75 mg via INTRAMUSCULAR
  Filled 2019-11-24: qty 3.75

## 2019-11-25 ENCOUNTER — Telehealth: Payer: Self-pay | Admitting: Oncology

## 2019-11-25 NOTE — Telephone Encounter (Signed)
Scheduled appts per 6/10 los. Mailed pt an appt reminder and calendar.

## 2019-11-28 ENCOUNTER — Encounter: Payer: Self-pay | Admitting: *Deleted

## 2019-11-30 ENCOUNTER — Telehealth: Payer: Self-pay | Admitting: General Practice

## 2019-11-30 NOTE — Telephone Encounter (Signed)
[facility] CSW Progress Notes  Call from [place], [place] interpreter.  Pt is requesting help w transport to radiation appointments.  Lives in [place].  As this is a patient of Dr Jana Hakim, messaged passed on to [place] to respond.  Edwyna Shell, LCSW Clinical Social Worker Phone:  [phone] Cell:  [phone]

## 2019-12-01 ENCOUNTER — Encounter: Payer: Self-pay | Admitting: Licensed Clinical Social Worker

## 2019-12-01 NOTE — Progress Notes (Signed)
[facility] CSW Progress Note  Clinical Social Worker contacted patient by phone with the assistance of telephonic [place] [phone] to follow-up on transportation needs. Received a message from interpreter, [name], that patient is asking about transportation for radiation treatment. She lives in [place] and is not eligible for Medicaid, so cannot access that transportation. Currently, she receives rides from her uncle 1x/week which he can continue to do but will not be able to drive her every day for radiation due to his job. CSW has explored other options but patient is not eligible due to age and/or location. CSW will refer to [facility] [place] [employer] for assistance.    Edwinna Areola Brianna Owens , LCSW

## 2019-12-06 ENCOUNTER — Encounter: Payer: Self-pay | Admitting: *Deleted

## 2019-12-08 ENCOUNTER — Other Ambulatory Visit: Payer: Self-pay

## 2019-12-08 ENCOUNTER — Inpatient Hospital Stay: Payer: No Typology Code available for payment source

## 2019-12-08 NOTE — Progress Notes (Signed)
Per Memory Argue, RN this appt was suppose to have been cancelled. She had already received her monthly injection. Notified patient and instructed her to go to scheduling to make sure she had an updated schedule.

## 2019-12-16 NOTE — Progress Notes (Signed)
Patient here for a new consult with Dr. Sondra Come.  Magrinat, [name], MD  Physician  Medical Oncology  Progress Notes     Signed  Encounter Date:  11/24/2019          Signed      Expand AllCollapse All    Show:Clear all '[x]' Manual'[x]' Template'[x]' Copied  Added by: '[x]' Daubenspeck, Kathryn L'[x]' Magrinat, Anselmo Rod, MD  '[]' Hover for details  [place]  [phone]) [phone] [phone]) [phone]     ID: [id number] DOB: Apr 17, 1991  MR#: 01/24/1991  [id number]  Patient Care Team: Patient, [name] as PCP - General (General Practice) Magrinat, BLT#:[id number], MD as Consulting Physician (Oncology) [name], MD as Consulting Physician (General Surgery) Erroll Luna, RN as Oncology Nurse Navigator [name], RN as Oncology Nurse Navigator [name], MD OTHER MD:  CHIEF COMPLAINT: Triple negative breast cancer, BRCA1 positive  CURRENT TREATMENT: Adjuvant radiation pending; continuing Luprolide   INTERVAL HISTORY: Brianna Owens returns today for follow up and treatment of her triple negative breast cancer.  She is accompanied by Chauncey Cruel our in person Spanish interpreter.     Since her last visit, she underwent right lumpectomy on 11/17/2019 under Dr. 19/08/2019. Pathology from the procedure [id number]) showed: no residual carcinoma in the breast or lymph nodes.  A total of 5 axillary lymph nodes were removed.  All 5 biopsied lymph nodes were negative for carcinoma (0/5).  She has not yet been scheduled for follow-up with radiation oncology.  Her most recent goserelin treatment was 10/14/2019   REVIEW OF SYSTEMS: Brianna Owens tolerated surgery remarkably well.  She still has some pain.  She has taken her pain medicines and is currently using ibuprofen.  She has numbness in the axilla particularly.  She had no bleeding or fever or other complications.  She has not yet been contacted by radiation.  She did not get a  Lupron shot in May for some reason.  She will receive it today.  A detailed review of systems was otherwise stable.    HISTORY OF CURRENT ILLNESS: From the original intake note:  [place] [facility] with a 3 month history of a right breast lump that became painful in early 04/2019. Physical exam performed at that time showed a palpable, tender 13 cm lump within the right center breast under the nipple area. She underwent right breast ultrasonography at [facility] on 04/27/2019 showing: 6.1 cm palpable mass centered in the 5 o'clock position of the right breast; single right inferomedial axillary lymph node with focal cortical thickening inferiorly.  Accordingly on 05/04/2019 she proceeded to biopsy of the right breast area in question. The pathology from this procedure 05/17/2019) showed: invasive ductal carcinoma, grade 2-3. Prognostic indicators significant for: estrogen receptor, 0% negative and progesterone receptor, 0% negative. Proliferation marker Ki67 at 40%.  I do not find HER-2 receptor documentation  The right axillary lymph node biopsied at that time showed reactive germinal centers.  She underwent bilateral diagnostic mammography with tomography at [facility] on 05/05/2019 showing: breast density category D; biopsy-proven right breast malignancy measures 6.1 cm; no additional masses or calcifications seen in the right breast; no evidence of malignancy in the left breast.  The patient's subsequent history is as detailed below.   PAST MEDICAL HISTORY:     Past Medical History:  Diagnosis Date  . Cancer ([place]) 05/05/2019    PAST SURGICAL HISTORY:  Past Surgical History:  Procedure Laterality Date  . BREAST LUMPECTOMY WITH RADIOACTIVE SEED AND SENTINEL LYMPH NODE BIOPSY Right 11/17/2019   Procedure: RIGHT BREAST LUMPECTOMY WITH RADIOACTIVE SEED;  Surgeon: Erroll Luna, MD;  Location:  [facility];  Service: General;  Laterality: Right;  PEC BLOCK  . IR IMAGING GUIDED PORT INSERTION  05/24/2019  . SENTINEL NODE BIOPSY Right 11/17/2019   Procedure: Sentinel Node Biopsy;  Surgeon: Erroll Luna, MD;  Location: [facility];  Service: General;  Laterality: Right;    FAMILY HISTORY:      Family History  Problem Relation Age of Onset  . Breast cancer Mother   . Breast cancer Maternal Aunt    Patient's father is 31 and her mother 48 as of November 2020.  The patient's mother was diagnosed with breast cancer in her early 38s.  She lives in [place]. The patient's mother's sister was also diagnosed with breast cancer in her early 59s.  The patient herself has 1 sister, no brothers.  She is not aware of any ovarian cancer cases in the family.   GYNECOLOGIC HISTORY:  No LMP recorded. (Menstrual status: Irregular Periods). Menarche: 30 years old Age at first live birth: 29 years old [place] P 2 LMP regular Contraceptive HRT n/a Hysterectomy? no BSO? no   SOCIAL HISTORY: (updated 04/2019)  Brianna Owens is currently working at [employer]  She is originally from [place].  She is divorced.  Her children are Brianna Owens, 13 years old, living in [place] with his father, and Brianna Owens, 39 years old, who lives with the patient.  Also at home are her uncle Brianna Owens, his wife, and that wife's cousin.                ADVANCED DIRECTIVES: Not in place   HEALTH MAINTENANCE: Social History       Tobacco Use  . Smoking status: Never Smoker  . Smokeless tobacco: Never Used  Vaping Use  . Vaping Use: Never used  Substance Use Topics  . Alcohol use: Not Currently  . Drug use: Not Currently                Colonoscopy: n/a             PAP: 04/26/2019, negative             Bone density: n/a              No Known Allergies        Current Outpatient Medications  Medication Sig Dispense Refill  . ibuprofen (ADVIL) 800 MG tablet Take 1 tablet (800 mg total) by mouth every 8 (eight)  hours as needed. 30 tablet 0  . oxyCODONE (OXY IR/ROXICODONE) 5 MG immediate release tablet Take 1 tablet (5 mg total) by mouth every 6 (six) hours as needed for severe pain. 15 tablet 0  . prochlorperazine (COMPAZINE) 10 MG tablet Take 10 mg by mouth every 6 (six) hours as needed for nausea or vomiting.     No current facility-administered medications for this visit.    OBJECTIVE:  Spanish speaker who appears stated age.     Vitals:   11/24/19 1333  BP: 120/64  Pulse: 90  Resp: 18  Temp: 99.1 F (37.3 C)  SpO2: 100%     Body mass index is 25.97 kg/m.      Wt Readings from Last 3 Encounters:  11/24/19 170 lb 12.8 oz (77.5 kg)  11/17/19 168 lb 1.6 oz (76.2 kg)  11/15/19 168  lb 1.6 oz (76.2 kg)      ECOG FS:1 - Symptomatic but completely ambulatory  Sclerae unicteric, EOMs intact Wearing a mask No cervical or supraclavicular adenopathy Lungs no rales or rhonchi Heart regular rate and rhythm Abd soft, nontender, positive bowel sounds MSK no focal spinal tenderness, no upper extremity lymphedema Neuro: nonfocal, well oriented, appropriate affect Breasts: The right breast is status post recent lumpectomy.  The cosmetic result is excellent.  The incision is not visible as it is inframammary.  There is no distortion of the breast contour.  The area of the rectocele is numb and there is a mild ecchymosis but otherwise it is unremarkable.  Left breast and left axilla are benign.   LAB RESULTS:  CMP     Labs (Brief)          Component Value Date/Time   NA 140 11/15/2019 1156   K 3.7 11/15/2019 1156   CL 104 11/15/2019 1156   CO2 28 11/15/2019 1156   GLUCOSE 112 (H) 11/15/2019 1156   BUN 7 11/15/2019 1156   CREATININE 0.45 11/15/2019 1156   CREATININE 0.45 07/21/2019 1025   CALCIUM 9.9 11/15/2019 1156   PROT 7.8 11/15/2019 1156   ALBUMIN 3.8 11/15/2019 1156   AST 102 (H) 11/15/2019 1156   AST 29 07/21/2019 1025   ALT 209 (H) 11/15/2019 1156     ALT 58 (H) 07/21/2019 1025   ALKPHOS 87 11/15/2019 1156   BILITOT 0.5 11/15/2019 1156   BILITOT 0.3 07/21/2019 1025   GFRNONAA >60 11/15/2019 1156   GFRNONAA >60 07/21/2019 1025   GFRAA >60 11/15/2019 1156   GFRAA >60 07/21/2019 1025      Recent Labs  No results found for: TOTALPROTELP, ALBUMINELP, A1GS, A2GS, BETS, BETA2SER, GAMS, MSPIKE, SPEI    Recent Labs  No results found for: KPAFRELGTCHN, LAMBDASER, KAPLAMBRATIO    Recent Labs       Lab Results  Component Value Date   WBC 6.0 11/24/2019   NEUTROABS 3.5 11/24/2019   HGB 10.9 (L) 11/24/2019   HCT 32.5 (L) 11/24/2019   MCV 96.7 11/24/2019   PLT 210 11/24/2019      Recent Labs  No results found for: LABCA2    Recent Labs  No components found for: [id number]    Last Labs   No results for input(s): INR in the last 168 hours.    Recent Labs  No results found for: LABCA2    Recent Labs  No results found for: CAN199    Recent Labs  No results found for: CAN125    Recent Labs  No results found for: HCW237    Recent Labs  No results found for: CA2729    Recent Labs  No components found for: HGQUANT    Recent Labs  No results found for: CEA1   /  Last Labs  No results found for: CEA1          Recent Labs  No results found for: AFPTUMOR    Recent Labs  No results found for: [place]  No results found for: HGBA, HGBA2QUANT, HGBFQUANT, HGBSQUAN   (Hemoglobinopathy evaluation)   Recent Labs  No results found for: LDH    Recent Labs  No results found for: IRON, TIBC, IRONPCTSAT   (Iron and TIBC)  Recent Labs  No results found for: FERRITIN    Urinalysis Labs (Brief)  No results found for: COLORURINE, APPEARANCEUR, [place], [place], [place], [place], [place], [place], [place],  UROBILINOGEN, NITRITE, LEUKOCYTESUR     STUDIES:  Imaging Results  NM Sentinel Node Inj-No Rpt (Breast)  Result Date:  11/17/2019 Sulfur colloid was injected by the nuclear medicine technologist for melanoma sentinel node.   MM Breast Surgical Specimen  Result Date: 11/17/2019 CLINICAL DATA:  Specimen radiograph status post right breast lumpectomy. EXAM: SPECIMEN RADIOGRAPH OF THE RIGHT BREAST COMPARISON:  Previous exam(s). FINDINGS: Status post excision of the right breast. The radioactive seed and biopsy marker clip are present, completely intact, and were marked for pathology. These findings were communicated with the OR at 8:10 a.m. IMPRESSION: Specimen radiograph of the right breast. Electronically Signed   By: Ammie Ferrier M.D.   On: 11/17/2019 08:12   MM RT RADIOACTIVE SEED LOC MAMMO GUIDE  Result Date: 11/16/2019 CLINICAL DATA:  29 year old female for radioactive seed localization of RIGHT breast cancer. EXAM: MAMMOGRAPHIC GUIDED RADIOACTIVE SEED LOCALIZATION OF THE RIGHT BREAST COMPARISON:  Previous exam(s). FINDINGS: Patient presents for radioactive seed localization prior to RIGHT lumpectomy. I met with the patient and we discussed the procedure of seed localization including benefits and alternatives. We discussed the high likelihood of a successful procedure. We discussed the risks of the procedure including infection, bleeding, tissue injury and further surgery. We discussed the low dose of radioactivity involved in the procedure. Informed, written consent was given. The usual time-out protocol was performed immediately prior to the procedure. Using mammographic guidance, sterile technique, 1% lidocaine and an I-125 radioactive seed, the Q clip was localized using a LATERAL approach. The follow-up mammogram images confirm the seed in the expected location and were marked for Dr. Brantley Stage. Follow-up survey of the patient confirms presence of the radioactive seed. Order number of I-125 seed:  [id number]. Total activity:  5.397 millicuries.  Reference Date: 10/28/2019. The patient tolerated the procedure well  and was [facility] from the [facility]. She was given instructions regarding seed removal. IMPRESSION: Radioactive seed localization RIGHT breast. No apparent complications. Electronically Signed   By: Margarette Canada M.D.   On: 11/16/2019 13:27      ELIGIBLE FOR AVAILABLE RESEARCH PROTOCOL:no  ASSESSMENT: 29 y.o. BRCA1 positive Brianna Owens woman status post right breast overlapping sites biopsy 05/04/2019 for a clinical T3 N0, stage IIIB invasive ductal carcinoma, grade 2, triple negative, with an MIB-1 of 40%.             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 The Common Hereditary Gene Panel offered by [facility] includes sequencing and/or deletion duplication testing of the following 48 genes: APC, ATM, AXIN2, BARD1, BMPR1A, BRCA1, BRCA2, BRIP1, CDH1, CDK4, CDKN2A (p14ARF), CDKN2A (p16INK4a), CHEK2, CTNNA1, DICER1, EPCAM (Deletion/duplication testing only), GREM1 (promoter region deletion/duplication testing only), KIT, MEN1, MLH1, MSH2, MSH3, MSH6, MUTYH, NBN, NF1, NHTL1, PALB2, PDGFRA, PMS2, POLD1, POLE, PTEN, RAD50, RAD51C, RAD51D, RNF43, SDHB, SDHC, SDHD, SMAD4, SMARCA4. STK11, TP53, TSC1, TSC2, and VHL. The following genes were evaluated for sequence changes only: SDHA and HOXB13 c.251G>A variant only.The report date is 05/24/2019.             (b) patient recommended bilateral mastectomies, due to potential barriers/risks with age and intensified screening  (2) neoadjuvant chemotherapy consisting of doxorubicin and cyclophosphamide in dose dense fashion x4 started 05/26/2019, completed 07/07/2019, followed by paclitaxel and carboplatin weekly x12 started  07/21/2019             (a) breast MRI 09/26/2019 shows a complete radiologic response  (3) status post right lumpectomy 11/17/2019  showing a complete pathologic response [ypT0 ypN0]             (a) a total of 5 axillary lymph nodes were removed  (4) adjuvant radiation pending  (5) consider bilateral salpingo-oophorectomy at the end of breast cancer treatment             (a) receiving Lupron every 28 days   PLAN: Brianna Owens at the best possible response to her neoadjuvant chemotherapy.  This does predict a good long-term result.  She is not ready for adjuvant radiation and we discussed some of the possible side effects of this treatment.  She understands she will need to be here on a daily basis for multiple weeks.  She will resume her Lupron today.  This is given every 28days.  I am hopeful at some point later this year she will agree to bilateral salpingo-oophorectomy.  Once she is done with radiation we will need to initiate intensified screening  She may be ready to remove her port after radiation.  Today she was not sure if she wanted to keep it or not  Total encounter time 30 minutes.[name] Jews C. Magrinat, MD 11/24/19 1:44 PM Medical Oncology and Hematology [facility] [place], [place] [address] Tel. [phone] Fax. [phone]   I, Wilburn Mylar, am acting as scribe for Dr. [name]. Magrinat.  I, Lurline Del MD, have reviewed the above documentation for accuracy and completeness, and I agree with the above.          Past/Anticipated interventions by medical oncology, if any: Chemotherapy   Lymphedema issues, if any: no  Pain issues, if any:  none  SAFETY ISSUES:  Prior radiation? no  Pacemaker/ICD? no  Possible current pregnancy? Has IUD  Is the patient on methotrexate? no  Current Complaints / other details: [place] Spanish Interpreter present.    De Burrs, RN 12/16/2019,11:47 AM BP 122/86 (BP Location: Left Arm)   Pulse 71   Temp (!) 97.4 F (36.3 C) (Oral)   Resp 16   Ht '5\' 1"'  (1.549 m)   Wt 166 lb 12.8 oz  (75.7 kg)   SpO2 100%   BMI 31.52 kg/m    Wt Readings from Last 3 Encounters:  12/21/19 166 lb 12.8 oz (75.7 kg)  11/24/19 170 lb 12.8 oz (77.5 kg)  11/17/19 168 lb 1.6 oz (76.2 kg)    wt

## 2019-12-20 NOTE — Progress Notes (Signed)
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 A diagnosis of Malignant neoplasm of overlapping sites of right breast in female, estrogen receptor negative ([place]) was also pertinent to this visit.  Stage IIIB (ypT0, ypN0) Right Breast LIQ, Invasive Ductal Carcinoma, ER- / PR- / Her2-, Grade 2  HISTORY OF PRESENT ILLNESS::Brianna Owens is a 29 y.o. female who is seen as a courtesy of Dr. Jana Hakim for an opinion concerning radiation therapy as part of management for her recently diagnosed breast cancer. Today, she is accompanied by a [place] interpreter. [facility] with a three-month history of a palpable right breast lump. Physical examination performed at that time showed a palpable, tender, 13 cm lump within the right center breast beneath the nipple area. Following this, she underwent a right breast ultrasound at [facility] on 04/27/2019 that showed a 6.1 cm palpable mass centered in the 5 o'clock position of the right breast. There was also noted to be a single right inferomedial axillary lymph node with focal cortical thickening inferiorly.  She underwent a biopsy of the right breast on 05/04/2019 that showed grade 2 invasive ductal carcinoma. Prognostic indicators were significant for estrogen receptor, 0% negative and progesterone receptor, 0% negative. Proliferation marker Ki67 at 40%. HER2 negative. The right axillary lymph node was also biopsied and showed reactive germinal centers.  Bilateral diagnostic mammography with tomography  at [facility] on 05[facility]05[facility]2019 showed breast density category D and biopsy-proven right breast malignancy that measured 6.1 cm. There were no additional masses or calcifications seen in the right breast and there was no evidence of malignancy in the left breast.  Genetics testing on 05/09/2019 revealed BRCA1 pathogenic variant. Due to the potential barriers/risks with age and intensified screening, it was recommended that the patient proceed with bilateral mastectomies.  She elected to proceed with breast conserving surgery if technically possible  MRI of bilateral breasts on 05/18/2019 showed a 7.1 cm right breast mass that was consistent with the patient's biopsy-proven malignancy. There were also noted to be at least seven morphologically abnormal level I right axillary lymph nodes. Three suspicious level III lymph nodes were partially visualized along the superior limits of the study. Additionally, there were two suspicious 9-10 mm right internal mammary chain lymph nodes. There were no other suspicious findings within the right breast and there was no MRI evidence of malignancy on the left.  CT scan of chest on 05/25/2019 showed a large right breast mass with metastatic right axillary, right subpectoral, and pre-vascular adenopathy. There were also borderline enlarged submental lymph nodes that were incompletely imaged. Finally, the left ventricle may have been somewhat dilated and the liver may have been steatotic. Bone scan on that same day did now show any scintigraphic evidence of skeletal metastasis.  She underwent neoadjuvant chemotherapy with Doxorubicin and Cyclophosphamide in dose dense fashion x4 under the care of Dr. Jana Hakim. She began treatment on 05/26/2019 and completed it on 07/07/2019. This was followed by Paclitaxel and Carboplatin weekly x12 that began on 07/21/2019 and was completed on 11/03/2019.  MRI of bilateral breasts on 09/26/2019  showed marked interval  improvement/resolution in the right breast mass and lymphadenopathy that was consistent with an excellent treatment response.  She opted to proceed with a right breast lumpectomy on 11/17/2019 performed by Dr. Brantley Stage. Pathology from the procedure did not show any residual carcinoma, indicating a complete pathologic response. A total of five sentinel right axillary lymph nodes were removed and biopsied; all were negative for carcinoma.  She was last seen by Dr. Jana Hakim on 11/24/2019. At that time, she was advised to resume Lupron to be given every 28 days. They also discussed a bilateral salpingo-oophorectomy in the future.  PREVIOUS RADIATION THERAPY: No  PAST MEDICAL HISTORY:  Past Medical History:  Diagnosis Date   Cancer ([place]) 05/05/2019    PAST SURGICAL HISTORY: Past Surgical History:  Procedure Laterality Date   BREAST LUMPECTOMY WITH RADIOACTIVE SEED AND SENTINEL LYMPH NODE BIOPSY Right 11/17/2019   Procedure: RIGHT BREAST LUMPECTOMY WITH RADIOACTIVE SEED;  Surgeon: Erroll Luna, MD;  Location: [facility];  Service: General;  Laterality: Right;  PEC BLOCK   IR IMAGING GUIDED PORT INSERTION  05/24/2019   SENTINEL NODE BIOPSY Right 11/17/2019   Procedure: Sentinel Node Biopsy;  Surgeon: Erroll Luna, MD;  Location: [place];  Service: General;  Laterality: Right;    FAMILY HISTORY:  Family History  Problem Relation Age of Onset   Breast cancer Mother    Breast cancer Maternal Aunt     SOCIAL HISTORY:  Social History   Tobacco Use   Smoking status: Never Smoker   Smokeless tobacco: Never Used  Vaping Use   Vaping Use: Never used  Substance Use Topics   Alcohol use: Not Currently   Drug use: Not Currently    ALLERGIES: No Known Allergies  MEDICATIONS:  Current Outpatient Medications  Medication Sig Dispense Refill   ibuprofen (ADVIL) 800 MG tablet Take 1 tablet (800 mg total) by mouth every 8 (eight) hours as needed. 30 tablet 0   oxyCODONE (OXY  IR/ROXICODONE) 5 MG immediate release tablet Take 1 tablet (5 mg total) by mouth every 6 (six) hours as needed for severe pain. 15 tablet 0   prochlorperazine (COMPAZINE) 10 MG tablet Take 10 mg by mouth every 6 (six) hours as needed for nausea or vomiting.     No current facility-administered medications for this encounter.    REVIEW OF SYSTEMS:  A 10+ POINT REVIEW OF SYSTEMS WAS OBTAINED including neurology, dermatology, psychiatry, cardiac, respiratory, lymph, extremities, GI, GU, musculoskeletal, constitutional, reproductive, HEENT.  She denies any pain within the right breast nipple discharge or bleeding.  She denies any numbness or tingling in her right arm or hand.  She denies any swelling in her right arm.   PHYSICAL EXAM:  height is '5\' 1"'  (1.549 m) and weight is 166 lb 12.8 oz (75.7 kg). Her oral temperature is 97.4 F (36.3 C) (abnormal). Her blood pressure is 122/86 and her pulse is 71. Her respiration is 16 and oxygen saturation is 100%.   General: Alert and oriented, in no acute distress HEENT: Head is normocephalic. Extraocular movements are intact.  Neck: Neck is supple, no palpable cervical or supraclavicular lymphadenopathy. Heart: Regular in rate and rhythm with no murmurs, rubs, or gallops. Chest: Clear to auscultation bilaterally, with no rhonchi, wheezes, or rales. Abdomen: Soft, nontender, nondistended, with no rigidity or guarding. Extremities: No cyanosis or edema. Lymphatics: see Neck Exam Skin: No concerning lesions. Musculoskeletal: symmetric strength and muscle tone throughout. Neurologic: Cranial nerves II through XII are grossly intact. No  obvious focalities. Speech is fluent. Coordination is intact. Psychiatric: Judgment and insight are intact. Affect is appropriate. Left breast: No palpable mass, nipple discharge, or bleeding. Right breast: Lumpectomy scar well-healed.  Axillary scar also well-healed.  No signs of infection.  No dominant mass appreciated breast  nipple discharge or bleeding  ECOG = 1  0 - Asymptomatic (Fully active, able to carry on all predisease activities without restriction)  1 - Symptomatic but completely ambulatory (Restricted in physically strenuous activity but ambulatory and able to carry out work of a light or sedentary nature. For example, light housework, office work)  2 - Symptomatic, <50% in bed during the day (Ambulatory and capable of all self care but unable to carry out any work activities. Up and about more than 50% of waking hours)  3 - Symptomatic, >50% in bed, but not bedbound (Capable of only limited self-care, confined to bed or chair 50% or more of waking hours)  4 - Bedbound (Completely disabled. Cannot carry on any self-care. Totally confined to bed or chair)  5 - Death   Eustace Pen MM, Creech RH, Tormey DC, et al. [phone]). "Toxicity and response criteria of the [facility]". [place] Oncol. 5 (6): 649-55  LABORATORY DATA:  Lab Results  Component Value Date   WBC 6.0 11/24/2019   HGB 10.9 (L) 11/24/2019   HCT 32.5 (L) 11/24/2019   MCV 96.7 11/24/2019   PLT 210 11/24/2019   NEUTROABS 3.5 11/24/2019   Lab Results  Component Value Date   NA 137 11/24/2019   K 3.8 11/24/2019   CL 102 11/24/2019   CO2 27 11/24/2019   GLUCOSE 107 (H) 11/24/2019   CREATININE 0.63 11/24/2019   CALCIUM 9.5 11/24/2019      RADIOGRAPHY: No results found.    IMPRESSION: Stage IIIB (ypT0, ypN0) Right Breast LIQ, Invasive Ductal Carcinoma, ER- / PR- / Her2-, Grade 2  Patient was noted to have a excellent response to her neoadjuvant chemotherapy with no residual disease in the breast or axillary region at the time of her surgery.  Given the patient's initial presentation with locally advanced disease I would still recommend adjuvant radiation therapy including coverage of the axillary region subpectoral node and ipsilateral internal mammary nodal chain given documented involvement in these areas  prior to her neoadjuvant chemotherapy chemotherapy.  Today, I talked to the patient with the assistance of an interpreter about the findings and work-up thus far.  We discussed the natural history of breast cancer and general treatment, highlighting the role of radiotherapy in the management.  We discussed the available radiation techniques, and focused on the details of logistics and delivery.  We reviewed the anticipated acute and late sequelae associated with radiation in this setting.  The patient was encouraged to ask questions that I answered to the best of my ability.  A patient consent form was discussed and signed.  We retained a copy for our records.  The patient would like to proceed with radiation and will be scheduled for CT simulation.  PLAN: Patient will return for CT simulation early next week with treatments to begin approximately a week later.  Anticipate ~ 6 weeks of adjuvant radiation therapy encompassing the right breast, axillary, supraclavicular, subpectoral and ipsilateral upper internal mammary nodal chain if technically possible.  Total time spent in this encounter was 65 minutes which included reviewing the patient's imaging (mammograms, ultrasounds, MRIs, CT scan, bone scan), chemotherapy treatment, labs and pathology reports, recent lumpectomy, follow-ups, physical  examination, and documentation and discussion of treatment which occupied significant time given the necessity of an interpreter. ------------------------------------------------  [name], PhD, MD  This document serves as a record of services personally performed by Gery Pray, MD. It was created on his behalf by Clerance Lav, a trained medical scribe. The creation of this record is based on the scribe's personal observations and the provider's statements to them. This document has been checked and approved by the attending provider.

## 2019-12-21 ENCOUNTER — Ambulatory Visit
Admission: RE | Admit: 2019-12-21 | Discharge: 2019-12-21 | Disposition: A | Discharge status: Home / Self Care | Payer: No Typology Code available for payment source | Source: Ambulatory Visit | Attending: Radiation Oncology | Admitting: Radiation Oncology

## 2019-12-21 ENCOUNTER — Encounter: Payer: Self-pay | Admitting: Radiation Oncology

## 2019-12-21 VITALS — BP 122/86 | HR 71 | Temp 97.4°F | Resp 16 | Ht 61.0 in | Wt 166.8 lb

## 2019-12-21 DIAGNOSIS — Z79899 Other long term (current) drug therapy: Secondary | ICD-10-CM | POA: Insufficient documentation

## 2019-12-21 DIAGNOSIS — Z1501 Genetic susceptibility to malignant neoplasm of breast: Secondary | ICD-10-CM

## 2019-12-21 DIAGNOSIS — Z803 Family history of malignant neoplasm of breast: Secondary | ICD-10-CM | POA: Insufficient documentation

## 2019-12-21 DIAGNOSIS — Z171 Estrogen receptor negative status [ER-]: Secondary | ICD-10-CM | POA: Insufficient documentation

## 2019-12-21 DIAGNOSIS — C50811 Malignant neoplasm of overlapping sites of right female breast: Secondary | ICD-10-CM | POA: Insufficient documentation

## 2019-12-22 ENCOUNTER — Inpatient Hospital Stay: Payer: No Typology Code available for payment source

## 2019-12-22 ENCOUNTER — Encounter: Payer: Self-pay | Admitting: *Deleted

## 2019-12-26 ENCOUNTER — Other Ambulatory Visit: Payer: Self-pay

## 2019-12-26 ENCOUNTER — Ambulatory Visit
Admission: RE | Admit: 2019-12-26 | Discharge: 2019-12-26 | Disposition: A | Discharge status: Home / Self Care | Payer: Self-pay | Source: Ambulatory Visit | Attending: Radiation Oncology | Admitting: Radiation Oncology

## 2019-12-26 ENCOUNTER — Encounter: Payer: Self-pay | Admitting: *Deleted

## 2019-12-26 ENCOUNTER — Inpatient Hospital Stay: Payer: No Typology Code available for payment source | Attending: Oncology

## 2019-12-26 VITALS — BP 130/76 | HR 72 | Temp 98.1°F | Resp 18

## 2019-12-26 DIAGNOSIS — C50811 Malignant neoplasm of overlapping sites of right female breast: Secondary | ICD-10-CM

## 2019-12-26 DIAGNOSIS — Z510 Encounter for antineoplastic radiation therapy: Secondary | ICD-10-CM | POA: Insufficient documentation

## 2019-12-26 DIAGNOSIS — Z171 Estrogen receptor negative status [ER-]: Secondary | ICD-10-CM | POA: Insufficient documentation

## 2019-12-26 DIAGNOSIS — Z01419 Encounter for gynecological examination (general) (routine) without abnormal findings: Secondary | ICD-10-CM

## 2019-12-26 DIAGNOSIS — Z95828 Presence of other vascular implants and grafts: Secondary | ICD-10-CM

## 2019-12-26 MED ORDER — LEUPROLIDE & NORETHINDRONE 3.75 & 5  MG CO KIT
3.7500 mg | PACK | Freq: Once | Status: AC
  Administered 2019-12-26: 3.75 mg
  Filled 2019-12-26: qty 1

## 2019-12-26 MED ORDER — LEUPROLIDE ACETATE 3.75 MG IM KIT: 3.7500 mg | PACK | Freq: Once | INTRAMUSCULAR | Status: DC

## 2019-12-26 NOTE — Progress Notes (Signed)
Pt assistance Lupaneta kit (combo leuprolide 3.75 mg for IM inj + norethindrone acetate 5 mg tabs) provided  Pt will not receive the Norethindrone 5 mg tablets.  Only receives Leuprolide portion.  Please remove the tablets when dispensing kit per Arbie Cookey. (We just needed the leuprolide portion)  Confirm [facility] disp is [phone]

## 2019-12-28 NOTE — Progress Notes (Signed)
  Radiation Oncology         (336) [phone] ________________________________  Name: Brianna Owens MRN: [id number]  Date: 01/02/2020  DOB: 01/04/1991  Simulation Verification Note    ICD-10-CM   1. Malignant neoplasm of overlapping sites of right breast in female, estrogen receptor negative ([place])  C50.811    Z17.1     NARRATIVE: The patient was brought to the treatment unit and placed in the planned treatment position. The clinical setup was verified. Then port films were obtained and uploaded to the radiation oncology medical record software.  The treatment beams were carefully compared against the planned radiation fields. The position location and shape of the radiation fields was reviewed. The targeted volume of tissue appears to be appropriately covered by the radiation beams. Organs at risk appear to be excluded as planned.  Based on my personal review, I approved the simulation verification. The patient's treatment will proceed as planned.  -----------------------------------  [name], PhD, MD  This document serves as a record of services personally performed by Gery Pray, MD. It was created on his behalf by Clerance Lav, a trained medical scribe. The creation of this record is based on the scribe's personal observations and the provider's statements to them. This document has been checked and approved by the attending provider.

## 2020-01-02 ENCOUNTER — Ambulatory Visit
Admission: RE | Admit: 2020-01-02 | Discharge: 2020-01-02 | Disposition: A | Discharge status: Home / Self Care | Payer: Self-pay | Source: Ambulatory Visit | Attending: Radiation Oncology | Admitting: Radiation Oncology

## 2020-01-02 ENCOUNTER — Other Ambulatory Visit: Payer: Self-pay

## 2020-01-02 DIAGNOSIS — C50811 Malignant neoplasm of overlapping sites of right female breast: Secondary | ICD-10-CM

## 2020-01-03 ENCOUNTER — Other Ambulatory Visit: Payer: Self-pay

## 2020-01-03 ENCOUNTER — Ambulatory Visit
Admission: RE | Admit: 2020-01-03 | Discharge: 2020-01-03 | Disposition: A | Discharge status: Home / Self Care | Payer: Self-pay | Source: Ambulatory Visit | Attending: Radiation Oncology | Admitting: Radiation Oncology

## 2020-01-04 ENCOUNTER — Other Ambulatory Visit: Payer: Self-pay

## 2020-01-04 ENCOUNTER — Ambulatory Visit
Admission: RE | Admit: 2020-01-04 | Discharge: 2020-01-04 | Disposition: A | Discharge status: Home / Self Care | Payer: Self-pay | Source: Ambulatory Visit | Attending: Radiation Oncology | Admitting: Radiation Oncology

## 2020-01-05 ENCOUNTER — Other Ambulatory Visit: Payer: Self-pay

## 2020-01-05 ENCOUNTER — Inpatient Hospital Stay: Payer: Self-pay

## 2020-01-05 ENCOUNTER — Telehealth: Payer: Self-pay

## 2020-01-05 ENCOUNTER — Ambulatory Visit
Admission: RE | Admit: 2020-01-05 | Discharge: 2020-01-05 | Disposition: A | Discharge status: Home / Self Care | Payer: Self-pay | Source: Ambulatory Visit | Attending: Radiation Oncology | Admitting: Radiation Oncology

## 2020-01-05 NOTE — Telephone Encounter (Signed)
Per Reyne Dumas, Pharmacist, patient does not need lupron injection today. Patient received lupron injection on 12/26/19.

## 2020-01-06 ENCOUNTER — Ambulatory Visit
Admission: RE | Admit: 2020-01-06 | Discharge: 2020-01-06 | Disposition: A | Discharge status: Home / Self Care | Payer: Self-pay | Source: Ambulatory Visit | Attending: Radiation Oncology | Admitting: Radiation Oncology

## 2020-01-09 ENCOUNTER — Ambulatory Visit
Admission: RE | Admit: 2020-01-09 | Discharge: 2020-01-09 | Disposition: A | Discharge status: Home / Self Care | Payer: Self-pay | Source: Ambulatory Visit | Attending: Radiation Oncology | Admitting: Radiation Oncology

## 2020-01-09 ENCOUNTER — Other Ambulatory Visit: Payer: Self-pay

## 2020-01-10 ENCOUNTER — Ambulatory Visit
Admission: RE | Admit: 2020-01-10 | Discharge: 2020-01-10 | Disposition: A | Discharge status: Home / Self Care | Payer: Self-pay | Source: Ambulatory Visit | Attending: Radiation Oncology | Admitting: Radiation Oncology

## 2020-01-10 ENCOUNTER — Other Ambulatory Visit: Payer: Self-pay

## 2020-01-11 ENCOUNTER — Ambulatory Visit
Admission: RE | Admit: 2020-01-11 | Discharge: 2020-01-11 | Disposition: A | Discharge status: Home / Self Care | Payer: Self-pay | Source: Ambulatory Visit | Attending: Radiation Oncology | Admitting: Radiation Oncology

## 2020-01-11 ENCOUNTER — Other Ambulatory Visit: Payer: Self-pay

## 2020-01-12 ENCOUNTER — Ambulatory Visit
Admission: RE | Admit: 2020-01-12 | Discharge: 2020-01-12 | Disposition: A | Discharge status: Home / Self Care | Payer: Self-pay | Source: Ambulatory Visit | Attending: Radiation Oncology | Admitting: Radiation Oncology

## 2020-01-12 ENCOUNTER — Other Ambulatory Visit: Payer: Self-pay

## 2020-01-13 ENCOUNTER — Other Ambulatory Visit: Payer: Self-pay

## 2020-01-13 ENCOUNTER — Ambulatory Visit
Admission: RE | Admit: 2020-01-13 | Discharge: 2020-01-13 | Disposition: A | Discharge status: Home / Self Care | Payer: Self-pay | Source: Ambulatory Visit | Attending: Radiation Oncology | Admitting: Radiation Oncology

## 2020-01-16 ENCOUNTER — Ambulatory Visit
Admission: RE | Admit: 2020-01-16 | Discharge: 2020-01-16 | Disposition: A | Discharge status: Home / Self Care | Payer: No Typology Code available for payment source | Source: Ambulatory Visit | Attending: Radiation Oncology | Admitting: Radiation Oncology

## 2020-01-16 ENCOUNTER — Other Ambulatory Visit: Payer: Self-pay

## 2020-01-16 DIAGNOSIS — Z171 Estrogen receptor negative status [ER-]: Secondary | ICD-10-CM | POA: Insufficient documentation

## 2020-01-16 DIAGNOSIS — C50811 Malignant neoplasm of overlapping sites of right female breast: Secondary | ICD-10-CM | POA: Insufficient documentation

## 2020-01-16 DIAGNOSIS — Z510 Encounter for antineoplastic radiation therapy: Secondary | ICD-10-CM | POA: Insufficient documentation

## 2020-01-17 ENCOUNTER — Ambulatory Visit
Admission: RE | Admit: 2020-01-17 | Discharge: 2020-01-17 | Disposition: A | Discharge status: Home / Self Care | Payer: No Typology Code available for payment source | Source: Ambulatory Visit | Attending: Radiation Oncology | Admitting: Radiation Oncology

## 2020-01-17 ENCOUNTER — Other Ambulatory Visit: Payer: Self-pay

## 2020-01-18 ENCOUNTER — Ambulatory Visit
Admission: RE | Admit: 2020-01-18 | Discharge: 2020-01-18 | Disposition: A | Discharge status: Home / Self Care | Payer: Self-pay | Source: Ambulatory Visit | Attending: Radiation Oncology | Admitting: Radiation Oncology

## 2020-01-18 ENCOUNTER — Other Ambulatory Visit: Payer: Self-pay

## 2020-01-18 NOTE — Progress Notes (Signed)
[facility]  [phone] [phone]     ID: Rajvi Armentor DOB: [birth year]  MR#: [id number]  JTT#:[id number]  Patient Care Team: Patient, [name] as PCP - General (General Practice) [name], [name], MD as Consulting Physician (Oncology) Erroll Luna, MD as Consulting Physician (General Surgery) [name], RN as Oncology Nurse Navigator [name], RN as Oncology Nurse Navigator Eppie Gibson, MD as Attending Physician (Radiation Oncology) Chauncey Cruel, MD OTHER MD:  CHIEF COMPLAINT: Triple negative breast cancer, BRCA1 positive  CURRENT TREATMENT: adjuvant radiation therapy; continuing Luprolide   INTERVAL HISTORY: Brianna Owens returns today for follow up of her triple negative breast cancer.  She is receiving adjuvant radiation. She started treatment on 01/02/2020 and is scheduled through 02/15/2020.  Her most recent Lupron shot was given on 11/24/2019.  She has a shot due today.   REVIEW OF SYSTEMS: [name] tells me she is tolerating the radiation treatments well, with no skin changes and no fatigue.  She is not able to work she says because of the inconvenience time of the radiation treatments.  As far as the Lupron shots are concerned she tolerates them well has no significant hot flashes or other postmenopausal symptoms and has had no periods after the second dose.  A detailed review of systems today was otherwise stable    HISTORY OF CURRENT ILLNESS: From the original intake note:  [name] [facility] with a 3 month history of a right breast lump that became painful in early 04/2019. Physical exam performed at that time showed a palpable, tender 13 cm lump within the right center breast under the nipple area. She underwent right breast ultrasonography at [facility] on 04/27/2019 showing: 6.1 cm palpable mass centered in the 5 o'clock  position of the right breast; single right inferomedial axillary lymph node with focal cortical thickening inferiorly.  Accordingly on 05/04/2019 she proceeded to biopsy of the right breast area in question. The pathology from this procedure ([id number]) showed: invasive ductal carcinoma, grade 2-3. Prognostic indicators significant for: estrogen receptor, 0% negative and progesterone receptor, 0% negative. Proliferation marker Ki67 at 40%.  I do not find HER-2 receptor documentation  The right axillary lymph node biopsied at that time showed reactive germinal centers.  She underwent bilateral diagnostic mammography with tomography at [facility] on 05/05/2019 showing: breast density category D; biopsy-proven right breast malignancy measures 6.1 cm; no additional masses or calcifications seen in the right breast; no evidence of malignancy in the left breast.  The patient's subsequent history is as detailed below.   PAST MEDICAL HISTORY: Past Medical History:  Diagnosis Date  . Cancer ([place]) 05/05/2019    PAST SURGICAL HISTORY: Past Surgical History:  Procedure Laterality Date  . BREAST LUMPECTOMY WITH RADIOACTIVE SEED AND SENTINEL LYMPH NODE BIOPSY Right 11/17/2019   Procedure: RIGHT BREAST LUMPECTOMY WITH RADIOACTIVE SEED;  Surgeon: Erroll Luna, MD;  Location: [facility];  Service: General;  Laterality: Right;  PEC BLOCK  . IR IMAGING GUIDED PORT INSERTION  05/24/2019  . SENTINEL NODE BIOPSY Right 11/17/2019   Procedure: Sentinel Node Biopsy;  Surgeon: Erroll Luna, MD;  Location: [facility];  Service: General;  Laterality: Right;    FAMILY HISTORY: Family History  Problem Relation Age of Onset  . Breast cancer Mother   . Breast cancer Maternal Aunt    Patient's father is 60 and her mother 72 as of November 2020.  The patient's mother was diagnosed with breast cancer in her early 84s.  She lives in [place]. The patient's mother's sister was also diagnosed with breast cancer in her  early 67s.  The patient herself has 1 sister, no brothers.  She is not aware of any ovarian cancer cases in the family.   GYNECOLOGIC HISTORY:  No LMP recorded. (Menstrual status: Irregular Periods). Menarche: 29 years old Age at first live birth: 29 years old [place] P 2 LMP regular Contraceptive HRT n/a  Hysterectomy? no BSO? no   SOCIAL HISTORY: (updated 04/2019)  Karlena normally works at [employer].  She is originally from [place].  She is divorced.  Her children are [name], 20 years old, living in [place] with his father, and [name], 46 years old, who lives with the patient.  Also at home are her uncle [name], his wife, and that wife's cousin.     ADVANCED DIRECTIVES: Not in place   HEALTH MAINTENANCE: Social History   Tobacco Use  . Smoking status: Never Smoker  . Smokeless tobacco: Never Used  Vaping Use  . Vaping Use: Never used  Substance Use Topics  . Alcohol use: Not Currently  . Drug use: Not Currently     Colonoscopy: n/a  PAP: 04/26/2019, negative  Bone density: n/a   No Known Allergies  Current Outpatient Medications  Medication Sig Dispense Refill  . ibuprofen (ADVIL) 800 MG tablet Take 1 tablet (800 mg total) by mouth every 8 (eight) hours as needed. 30 tablet 0  . oxyCODONE (OXY IR/ROXICODONE) 5 MG immediate release tablet Take 1 tablet (5 mg total) by mouth every 6 (six) hours as needed for severe pain. 15 tablet 0  . prochlorperazine (COMPAZINE) 10 MG tablet Take 10 mg by mouth every 6 (six) hours as needed for nausea or vomiting.     No current facility-administered medications for this visit.    OBJECTIVE:  Spanish speaker in no acute distress  Vitals:   01/19/20 0818  BP: 114/65  Pulse: 77  Resp: 20  Temp: 98.7 F (37.1 C)  SpO2: 100%     Body mass index is 33.52 kg/m.   Wt Readings from Last 3 Encounters:  01/19/20 177 lb 6.4 oz (80.5 kg)  12/21/19 166 lb 12.8 oz (75.7 kg)  11/24/19 170 lb 12.8 oz (77.5 kg)      ECOG FS:1 -  Symptomatic but completely ambulatory  Hair has come back full and dark, not particularly currently Sclerae unicteric, EOMs intact Wearing a mask No cervical or supraclavicular adenopathy Lungs no rales or rhonchi Heart regular rate and rhythm Abd soft, nontender, positive bowel sounds MSK no focal spinal tenderness, no upper extremity lymphedema Neuro: nonfocal, well oriented, appropriate affect Breasts: The right breast is status post lumpectomy and is currently receiving radiation.  The cosmetic result is excellent.  There is no hyperpigmentation at present.  There is no evidence of local recurrence.  The left breast is benign.  Both axillae are benign.   LAB RESULTS:  CMP     Component Value Date/Time   NA 137 11/24/2019 1318   K 3.8 11/24/2019 1318   CL 102 11/24/2019 1318   CO2 27 11/24/2019 1318   GLUCOSE 107 (H) 11/24/2019 1318   BUN 9 11/24/2019 1318   CREATININE 0.63 11/24/2019 1318   CREATININE 0.45 07/21/2019 1025   CALCIUM 9.5 11/24/2019 1318   PROT 7.7 11/24/2019 1318   ALBUMIN 3.8 11/24/2019 1318   AST 83 (H) 11/24/2019  1318   AST 29 07/21/2019 1025   ALT 196 (H) 11/24/2019 1318   ALT 58 (H) 07/21/2019 1025   ALKPHOS 110 11/24/2019 1318   BILITOT 0.4 11/24/2019 1318   BILITOT 0.3 07/21/2019 1025   GFRNONAA >60 11/24/2019 1318   GFRNONAA >60 07/21/2019 1025   GFRAA >60 11/24/2019 1318   GFRAA >60 07/21/2019 1025    No results found for: TOTALPROTELP, ALBUMINELP, A1GS, A2GS, BETS, BETA2SER, GAMS, MSPIKE, SPEI  No results found for: KPAFRELGTCHN, LAMBDASER, KAPLAMBRATIO  Lab Results  Component Value Date   WBC 6.0 11/24/2019   NEUTROABS 3.5 11/24/2019   HGB 10.9 (L) 11/24/2019   HCT 32.5 (L) 11/24/2019   MCV 96.7 11/24/2019   PLT 210 11/24/2019    No results found for: LABCA2  No components found for: OEVOJJ009  No results for input(s): INR in the last 168 hours.  No results found for: LABCA2  No results found for: FGH829  No results  found for: HBZ169  No results found for: CVE938  No results found for: CA2729  No components found for: HGQUANT  No results found for: CEA1 / No results found for: CEA1   No results found for: AFPTUMOR  No results found for: CHROMOGRNA  No results found for: HGBA, HGBA2QUANT, HGBFQUANT, HGBSQUAN (Hemoglobinopathy evaluation)   No results found for: LDH  No results found for: IRON, TIBC, IRONPCTSAT (Iron and TIBC)  No results found for: FERRITIN  Urinalysis No results found for: COLORURINE, APPEARANCEUR, LABSPEC, PHURINE, GLUCOSEU, HGBUR, BILIRUBINUR, KETONESUR, PROTEINUR, UROBILINOGEN, NITRITE, LEUKOCYTESUR   STUDIES: No results found.   ELIGIBLE FOR AVAILABLE RESEARCH PROTOCOL:no  ASSESSMENT: 29 y.o. BRCA1 positive [name] woman status post right breast overlapping sites biopsy 05/04/2019 for a clinical T3 N0, stage IIIB invasive ductal carcinoma, grade 2, triple negative, with an MIB-1 of 40%.  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  The Common Hereditary Gene Panel offered by [facility] includes sequencing and/or deletion duplication testing of the following 48 genes: APC, ATM, AXIN2, BARD1, BMPR1A, BRCA1, BRCA2, BRIP1, CDH1, CDK4, CDKN2A (p14ARF), CDKN2A (p16INK4a), CHEK2, CTNNA1, DICER1, EPCAM (Deletion/duplication testing only), GREM1 (promoter region deletion/duplication testing only), KIT, MEN1, MLH1, MSH2, MSH3, MSH6, MUTYH, NBN, NF1, NHTL1, PALB2, PDGFRA, PMS2, POLD1, POLE, PTEN, RAD50, RAD51C, RAD51D, RNF43, SDHB, SDHC, SDHD, SMAD4, SMARCA4. STK11, TP53, TSC1, TSC2, and VHL.  The following genes were evaluated for sequence changes only: SDHA and HOXB13 c.251G>A variant only. The report date is 05/24/2019.  (b) patient recommended bilateral mastectomies, due to potential  barriers/risks with age and intensified screening  (2) neoadjuvant chemotherapy consisting of doxorubicin and cyclophosphamide in dose dense fashion x4 started 05/26/2019, completed 07/07/2019, followed by paclitaxel and carboplatin weekly x12 started 07/21/2019  (a) breast MRI 09/26/2019 shows a complete radiologic response  (3) status post right lumpectomy 11/17/2019 showing a complete pathologic response [ypT0 ypN0]  (a) a total of 5 axillary lymph nodes were removed  (4) adjuvant radiation to be completed 02/15/2020  (5) consider bilateral salpingo-oophorectomy at the end of breast cancer treatment  (a) receiving Lupron every 28 days   PLAN: Diondra is now 2 months out from definitive surgery for her breast cancer.  She had an excellent cosmetic result and is tolerating radiation well.  She is receiving Lupron every 28 days.  Her appointments were somewhat mixed up and I have clarified  them.  She understands she does not need to come back in 2 weeks.  She will return in 4 weeks and then 8 weeks.  Even though she had a complete pathologic response given her genetic mutation she will need close follow-up indefinitely.  She would like to have her port removed.  I have alerted her surgeon Dr. Brantley Stage regarding this  Otherwise she will call with any other issues that may develop before the next visit.  Total encounter time 20 minutes.[name] Jews C. Azilee Pirro, MD 01/19/20 8:36 AM Medical Oncology and Hematology [facility] [place], [place] [address] Tel. [phone]    Fax. [phone]   I, Wilburn Mylar, am acting as scribe for Dr. [name]. Neasia Fleeman.  I, Lurline Del MD, have reviewed the above documentation for accuracy and completeness, and I agree with the above.   *Total Encounter Time as defined by the Centers for Medicare and Medicaid Services includes, in addition to the face-to-face time of a patient visit (documented in the note above)  non-face-to-face time: obtaining and reviewing outside history, ordering and reviewing medications, tests or procedures, care coordination (communications with other health care professionals or caregivers) and documentation in the medical record.

## 2020-01-19 ENCOUNTER — Inpatient Hospital Stay (HOSPITAL_BASED_OUTPATIENT_CLINIC_OR_DEPARTMENT_OTHER): Payer: Self-pay | Admitting: Oncology

## 2020-01-19 ENCOUNTER — Other Ambulatory Visit: Payer: Self-pay

## 2020-01-19 ENCOUNTER — Ambulatory Visit
Admission: RE | Admit: 2020-01-19 | Discharge: 2020-01-19 | Disposition: A | Discharge status: Home / Self Care | Payer: No Typology Code available for payment source | Source: Ambulatory Visit | Attending: Radiation Oncology | Admitting: Radiation Oncology

## 2020-01-19 ENCOUNTER — Inpatient Hospital Stay: Payer: No Typology Code available for payment source | Attending: Oncology

## 2020-01-19 VITALS — BP 114/65 | HR 77 | Temp 98.7°F | Resp 20 | Ht 61.0 in | Wt 177.4 lb

## 2020-01-19 DIAGNOSIS — Z171 Estrogen receptor negative status [ER-]: Secondary | ICD-10-CM | POA: Insufficient documentation

## 2020-01-19 DIAGNOSIS — Z01419 Encounter for gynecological examination (general) (routine) without abnormal findings: Secondary | ICD-10-CM

## 2020-01-19 DIAGNOSIS — Z1501 Genetic susceptibility to malignant neoplasm of breast: Secondary | ICD-10-CM | POA: Insufficient documentation

## 2020-01-19 DIAGNOSIS — C50811 Malignant neoplasm of overlapping sites of right female breast: Secondary | ICD-10-CM

## 2020-01-19 DIAGNOSIS — Z1502 Genetic susceptibility to malignant neoplasm of ovary: Secondary | ICD-10-CM

## 2020-01-19 DIAGNOSIS — Z803 Family history of malignant neoplasm of breast: Secondary | ICD-10-CM | POA: Insufficient documentation

## 2020-01-19 DIAGNOSIS — Z95828 Presence of other vascular implants and grafts: Secondary | ICD-10-CM

## 2020-01-19 DIAGNOSIS — N926 Irregular menstruation, unspecified: Secondary | ICD-10-CM | POA: Insufficient documentation

## 2020-01-19 DIAGNOSIS — Z79818 Long term (current) use of other agents affecting estrogen receptors and estrogen levels: Secondary | ICD-10-CM | POA: Insufficient documentation

## 2020-01-19 DIAGNOSIS — Z791 Long term (current) use of non-steroidal anti-inflammatories (NSAID): Secondary | ICD-10-CM | POA: Insufficient documentation

## 2020-01-19 DIAGNOSIS — Z9221 Personal history of antineoplastic chemotherapy: Secondary | ICD-10-CM | POA: Insufficient documentation

## 2020-01-19 MED ORDER — LEUPROLIDE ACETATE (3 MONTH) 22.5 MG ~~LOC~~ KIT
PACK | SUBCUTANEOUS | Status: AC
  Filled 2020-01-19: qty 22.5

## 2020-01-19 MED ORDER — LEUPROLIDE ACETATE 3.75 MG IM KIT
3.7500 mg | PACK | Freq: Once | INTRAMUSCULAR | Status: AC
  Administered 2020-01-19: 3.75 mg via INTRAMUSCULAR
  Filled 2020-01-19: qty 3.75

## 2020-01-19 NOTE — Patient Instructions (Signed)
Leuprolide injection Qu es este medicamento? El LEUPROLIDE es una hormona artificial. Se utiliza para tratar sntomas del cncer de prstata. Este medicamento tambin puede utilizarse para el tratamiento de la pubertad precoz en nios. Se puede utilizar para otras enfermedades hormonales. Este medicamento puede ser utilizado para otros usos; si tiene alguna pregunta consulte con su proveedor de atencin mdica o con su farmacutico. MARCAS COMUNES: Lupron Qu le debo informar a mi profesional de la salud antes de tomar este medicamento? Necesitan saber si usted presenta alguno de los [employer] o situaciones: diabetes enfermedad cardiaca o ataque cardiaco previo alta presin sangunea nivel de colesterol alto dolor o dificultad para orinar metstasis en la mdula espinal accidente cerebrovascular fuma tabaco una reaccin alrgica o inusual a la leuprolida (leuprorelina), al alcohol benclico, a otros medicamentos, alimentos, colorantes o conservantes si est embarazada o buscando quedar embarazada si est amamantando a un beb Cmo debo utilizar este medicamento? Este medicamento se administra mediante inyeccin debajo de la piel o en un msculo. Le ensearn cmo preparar y Biomedical engineer. selo exactamente como se le indique. Tome su medicamento a intervalos regulares. No tome su medicamento con una frecuencia mayor a la indicada. Es importante que deseche las agujas y las jeringas usadas en un recipiente resistente a los pinchazos. No los deseche en la basura. Si no tiene un recipiente resistente a los pinchazos, consulte a Midwife o proveedor de atencin de la salud para obtenerlo. Su farmacutico le dar una Gua del medicamento especial (MedGuide, nombre en ingls) con cada receta y en cada ocasin que la vuelva a surtir. Asegrese de leer esta informacin cada vez cuidadosamente. Hable con su pediatra para informarse acerca del uso de este medicamento en nios.  Aunque este medicamento se puede recetar a nios tan pequeos como de 8 aos de edad en casos selectos, existen precauciones que deben tomarse. Sobredosis: Pngase en contacto inmediatamente con un centro toxicolgico o una sala de urgencia si usted cree que haya tomado demasiado medicamento. ATENCIN: [employer] es solo para usted. No comparta este medicamento con nadie. Qu sucede si me olvido de una dosis? Si olvida una dosis, sela lo antes posible. Si es casi la hora de la prxima dosis, use slo esa dosis. No use dosis adicionales o dobles. Qu puede interactuar con este medicamento? No tome esta medicina con ninguno de los siguientes medicamentos:  vitex Esta medicina tambin puede interactuar con los siguientes medicamentos:  suplementos a base de hierbas o dietticos como cimicifuga racemosa o DHEA  hormonas femeninas, como estrgenos, progestinas o pldoras, parches, anillos o inyecciones anticonceptivas  hormonas masculinas como la testosterona Puede ser que esta lista no menciona todas las posibles interacciones. Informe a su profesional de [employer]	[place] de [employer] productos a base de hierbas, medicamentos de [place] o suplementos nutritivos que est tomando. Si usted fuma, consume bebidas alcohlicas o si utiliza drogas ilegales, indqueselo tambin a su profesional de [employer]	[place]. Algunas sustancias pueden interactuar con su medicamento. A qu debo estar atento al usar [employer]? Visite a su mdico o a su profesional de la salud para que revise regularmente su evolucin. Durante la primera semana, sus sntomas podran empeorar, pero luego mejorarn a medida que contine su tratamiento. Es posible que tenga sofocos, dolor seo ms intenso, mayor dificultad para Garment/textile technologist o un agravamiento de los sntomas neurolgicos. Converse sobre estos efectos con su mdico o con su profesional de [employer]	[place]. Algunos pueden mejorar con el uso continuo de [place].  Las mujeres  pueden tener un ciclo menstrual o [employer] durante los primeros 2 meses de terapia con este medicamento. Si esto contina, contacte a su mdico o profesional de [employer]	[place]. Este medicamento puede aumentar los niveles de Dispensing optician. Pregntele a su profesional de la salud si es Chartered loss adjuster cambios en la dieta o en los medicamentos si usted tiene diabetes. Qu efectos secundarios puedo tener al [employer] este medicamento? Efectos secundarios que debe informar a su mdico o a Barrister's clerk de la salud tan pronto como sea posible: Chief of Staff, como erupcin cutnea, comezn/picazn o urticaria, e hinchazn de la cara, los labios o la lengua problemas para Contractor en el pecho depresin o trastornos de Programmer, applications en las piernas o la entrepierna Management consultant de la inyeccin dolor de cabeza grave signos y sntomas de niveles altos de Location manager en la sangre, tales como tener ms sed o apetito, o tener que orinar con mayor frecuencia que lo habitual. Tambin puede sentirse muy cansado o tener visin borrosa hinchazn de los pies y las piernas cambios en la visin vmito Efectos secundarios que generalmente no requieren atencin mdica (infrmelos a su mdico o a su profesional de la salud si persisten o si son molestos): sensibilidad o hinchazn de las mamas disminucin del deseo o desempeo sexual diarrea sofocos prdida del apetito Patent examiner, articulaciones o huesos nuseas enrojecimiento o Actor de la inyeccin problemas de piel o acn Puede ser que esta lista no menciona todos los posibles efectos secundarios. Comunquese a su mdico por asesoramiento mdico [employer]. Usted puede informar los efectos secundarios a la FDA por telfono al 1-800-FDA-1088. Dnde debo guardar mi medicina? Mantngala fuera del alcance de los nios. Gurdela a una temperatura inferior de 25 grados C (77 grados F). No la congele. Protjala de la  luz. No utilizarla si no es transparente o si contiene partculas. Deseche todo el medicamento que no haya utilizado, despus de la fecha de vencimiento. ATENCIN: Este folleto es un resumen. Puede ser que no cubra toda la posible informacin. Si usted tiene preguntas acerca de esta medicina, consulte con su mdico, su farmacutico o su profesional de Technical sales engineer.  2020 Elsevier/Gold Standard (2018-11-01 00:00:00)

## 2020-01-20 ENCOUNTER — Ambulatory Visit
Admission: RE | Admit: 2020-01-20 | Discharge: 2020-01-20 | Disposition: A | Discharge status: Home / Self Care | Payer: No Typology Code available for payment source | Source: Ambulatory Visit | Attending: Radiation Oncology | Admitting: Radiation Oncology

## 2020-01-20 ENCOUNTER — Other Ambulatory Visit: Payer: Self-pay

## 2020-01-20 ENCOUNTER — Telehealth: Payer: Self-pay | Admitting: Oncology

## 2020-01-20 NOTE — Telephone Encounter (Signed)
Scheduled appts per 8/5 los. Was not able to leave voicemail. Mailed appt reminder and calendar.

## 2020-01-23 ENCOUNTER — Other Ambulatory Visit: Payer: Self-pay

## 2020-01-23 ENCOUNTER — Ambulatory Visit
Admission: RE | Admit: 2020-01-23 | Discharge: 2020-01-23 | Disposition: A | Discharge status: Home / Self Care | Payer: No Typology Code available for payment source | Source: Ambulatory Visit | Attending: Radiation Oncology | Admitting: Radiation Oncology

## 2020-01-24 ENCOUNTER — Ambulatory Visit
Admission: RE | Admit: 2020-01-24 | Discharge: 2020-01-24 | Disposition: A | Discharge status: Home / Self Care | Payer: No Typology Code available for payment source | Source: Ambulatory Visit | Attending: Radiation Oncology | Admitting: Radiation Oncology

## 2020-01-24 ENCOUNTER — Other Ambulatory Visit: Payer: Self-pay

## 2020-01-25 ENCOUNTER — Ambulatory Visit
Admission: RE | Admit: 2020-01-25 | Discharge: 2020-01-25 | Disposition: A | Discharge status: Home / Self Care | Payer: No Typology Code available for payment source | Source: Ambulatory Visit | Attending: Radiation Oncology | Admitting: Radiation Oncology

## 2020-01-25 ENCOUNTER — Other Ambulatory Visit: Payer: Self-pay

## 2020-01-26 ENCOUNTER — Ambulatory Visit
Admission: RE | Admit: 2020-01-26 | Discharge: 2020-01-26 | Disposition: A | Discharge status: Home / Self Care | Payer: No Typology Code available for payment source | Source: Ambulatory Visit | Attending: Radiation Oncology | Admitting: Radiation Oncology

## 2020-01-26 ENCOUNTER — Other Ambulatory Visit: Payer: Self-pay

## 2020-01-27 ENCOUNTER — Other Ambulatory Visit: Payer: Self-pay

## 2020-01-27 ENCOUNTER — Ambulatory Visit
Admission: RE | Admit: 2020-01-27 | Discharge: 2020-01-27 | Disposition: A | Discharge status: Home / Self Care | Payer: No Typology Code available for payment source | Source: Ambulatory Visit | Attending: Radiation Oncology | Admitting: Radiation Oncology

## 2020-01-30 ENCOUNTER — Other Ambulatory Visit: Payer: Self-pay

## 2020-01-30 ENCOUNTER — Ambulatory Visit
Admission: RE | Admit: 2020-01-30 | Discharge: 2020-01-30 | Disposition: A | Discharge status: Home / Self Care | Payer: No Typology Code available for payment source | Source: Ambulatory Visit | Attending: Radiation Oncology | Admitting: Radiation Oncology

## 2020-01-31 ENCOUNTER — Ambulatory Visit
Admission: RE | Admit: 2020-01-31 | Discharge: 2020-01-31 | Disposition: A | Discharge status: Home / Self Care | Payer: No Typology Code available for payment source | Source: Ambulatory Visit | Attending: Radiation Oncology | Admitting: Radiation Oncology

## 2020-01-31 ENCOUNTER — Other Ambulatory Visit: Payer: Self-pay

## 2020-01-31 DIAGNOSIS — C50811 Malignant neoplasm of overlapping sites of right female breast: Secondary | ICD-10-CM

## 2020-01-31 MED ORDER — SONAFINE EX EMUL
1.0000 "application " | Freq: Two times a day (BID) | CUTANEOUS | Status: DC
  Administered 2020-01-31: 1 via TOPICAL

## 2020-02-01 ENCOUNTER — Other Ambulatory Visit: Payer: Self-pay

## 2020-02-01 ENCOUNTER — Ambulatory Visit
Admission: RE | Admit: 2020-02-01 | Discharge: 2020-02-01 | Disposition: A | Discharge status: Home / Self Care | Payer: No Typology Code available for payment source | Source: Ambulatory Visit | Attending: Radiation Oncology | Admitting: Radiation Oncology

## 2020-02-02 ENCOUNTER — Ambulatory Visit: Payer: Self-pay

## 2020-02-02 ENCOUNTER — Other Ambulatory Visit: Payer: Self-pay

## 2020-02-02 ENCOUNTER — Ambulatory Visit
Admission: RE | Admit: 2020-02-02 | Discharge: 2020-02-02 | Disposition: A | Discharge status: Home / Self Care | Payer: No Typology Code available for payment source | Source: Ambulatory Visit | Attending: Radiation Oncology | Admitting: Radiation Oncology

## 2020-02-03 ENCOUNTER — Other Ambulatory Visit: Payer: Self-pay

## 2020-02-03 ENCOUNTER — Ambulatory Visit
Admission: RE | Admit: 2020-02-03 | Discharge: 2020-02-03 | Disposition: A | Discharge status: Home / Self Care | Payer: No Typology Code available for payment source | Source: Ambulatory Visit | Attending: Radiation Oncology | Admitting: Radiation Oncology

## 2020-02-06 ENCOUNTER — Other Ambulatory Visit: Payer: Self-pay

## 2020-02-06 ENCOUNTER — Ambulatory Visit
Admission: RE | Admit: 2020-02-06 | Discharge: 2020-02-06 | Disposition: A | Discharge status: Home / Self Care | Payer: No Typology Code available for payment source | Source: Ambulatory Visit | Attending: Radiation Oncology | Admitting: Radiation Oncology

## 2020-02-07 ENCOUNTER — Other Ambulatory Visit: Payer: Self-pay

## 2020-02-07 ENCOUNTER — Ambulatory Visit
Admission: RE | Admit: 2020-02-07 | Discharge: 2020-02-07 | Disposition: A | Discharge status: Home / Self Care | Payer: No Typology Code available for payment source | Source: Ambulatory Visit | Attending: Radiation Oncology | Admitting: Radiation Oncology

## 2020-02-08 ENCOUNTER — Ambulatory Visit
Admission: RE | Admit: 2020-02-08 | Discharge: 2020-02-08 | Disposition: A | Discharge status: Home / Self Care | Payer: No Typology Code available for payment source | Source: Ambulatory Visit | Attending: Radiation Oncology | Admitting: Radiation Oncology

## 2020-02-08 ENCOUNTER — Other Ambulatory Visit: Payer: Self-pay

## 2020-02-09 ENCOUNTER — Other Ambulatory Visit: Payer: Self-pay

## 2020-02-09 ENCOUNTER — Ambulatory Visit
Admission: RE | Admit: 2020-02-09 | Discharge: 2020-02-09 | Disposition: A | Discharge status: Home / Self Care | Payer: No Typology Code available for payment source | Source: Ambulatory Visit | Attending: Radiation Oncology | Admitting: Radiation Oncology

## 2020-02-10 ENCOUNTER — Other Ambulatory Visit: Payer: Self-pay

## 2020-02-10 ENCOUNTER — Ambulatory Visit
Admission: RE | Admit: 2020-02-10 | Discharge: 2020-02-10 | Disposition: A | Discharge status: Home / Self Care | Payer: No Typology Code available for payment source | Source: Ambulatory Visit | Attending: Radiation Oncology | Admitting: Radiation Oncology

## 2020-02-13 ENCOUNTER — Other Ambulatory Visit: Payer: Self-pay

## 2020-02-13 ENCOUNTER — Ambulatory Visit
Admission: RE | Admit: 2020-02-13 | Discharge: 2020-02-13 | Disposition: A | Discharge status: Home / Self Care | Payer: No Typology Code available for payment source | Source: Ambulatory Visit | Attending: Radiation Oncology | Admitting: Radiation Oncology

## 2020-02-14 ENCOUNTER — Encounter: Payer: Self-pay | Admitting: *Deleted

## 2020-02-14 ENCOUNTER — Ambulatory Visit
Admission: RE | Admit: 2020-02-14 | Discharge: 2020-02-14 | Disposition: A | Discharge status: Home / Self Care | Payer: No Typology Code available for payment source | Source: Ambulatory Visit | Attending: Radiation Oncology | Admitting: Radiation Oncology

## 2020-02-14 ENCOUNTER — Other Ambulatory Visit: Payer: Self-pay

## 2020-02-15 ENCOUNTER — Telehealth: Payer: Self-pay | Admitting: Oncology

## 2020-02-15 ENCOUNTER — Ambulatory Visit
Admission: RE | Admit: 2020-02-15 | Discharge: 2020-02-15 | Disposition: A | Discharge status: Home / Self Care | Payer: No Typology Code available for payment source | Source: Ambulatory Visit | Attending: Radiation Oncology | Admitting: Radiation Oncology

## 2020-02-15 ENCOUNTER — Encounter: Payer: Self-pay | Admitting: Radiation Oncology

## 2020-02-15 DIAGNOSIS — C50811 Malignant neoplasm of overlapping sites of right female breast: Secondary | ICD-10-CM | POA: Insufficient documentation

## 2020-02-15 DIAGNOSIS — Z171 Estrogen receptor negative status [ER-]: Secondary | ICD-10-CM | POA: Insufficient documentation

## 2020-02-15 DIAGNOSIS — Z510 Encounter for antineoplastic radiation therapy: Secondary | ICD-10-CM | POA: Insufficient documentation

## 2020-02-15 NOTE — Telephone Encounter (Signed)
Rescheduled appt on 9/2 to earlier time per pt request. Pt is aware of appt time and date.

## 2020-02-16 ENCOUNTER — Inpatient Hospital Stay: Payer: No Typology Code available for payment source | Attending: Oncology

## 2020-02-16 ENCOUNTER — Inpatient Hospital Stay: Payer: No Typology Code available for payment source

## 2020-02-16 ENCOUNTER — Other Ambulatory Visit: Payer: Self-pay

## 2020-02-16 VITALS — BP 111/82 | HR 76 | Temp 98.2°F | Resp 18

## 2020-02-16 DIAGNOSIS — Z171 Estrogen receptor negative status [ER-]: Secondary | ICD-10-CM | POA: Insufficient documentation

## 2020-02-16 DIAGNOSIS — Z5111 Encounter for antineoplastic chemotherapy: Secondary | ICD-10-CM | POA: Insufficient documentation

## 2020-02-16 DIAGNOSIS — C50811 Malignant neoplasm of overlapping sites of right female breast: Secondary | ICD-10-CM | POA: Insufficient documentation

## 2020-02-16 DIAGNOSIS — Z95828 Presence of other vascular implants and grafts: Secondary | ICD-10-CM

## 2020-02-16 DIAGNOSIS — Z803 Family history of malignant neoplasm of breast: Secondary | ICD-10-CM | POA: Insufficient documentation

## 2020-02-16 DIAGNOSIS — Z01419 Encounter for gynecological examination (general) (routine) without abnormal findings: Secondary | ICD-10-CM

## 2020-02-16 MED ORDER — LEUPROLIDE ACETATE 3.75 MG IM KIT
3.7500 mg | PACK | Freq: Once | INTRAMUSCULAR | Status: AC
  Administered 2020-02-16: 3.75 mg via INTRAMUSCULAR
  Filled 2020-02-16: qty 3.75

## 2020-02-16 NOTE — Progress Notes (Signed)
Patient wanted MD or nurse to give her a call regarding removal of port. I sent Darci Current, RN a message.

## 2020-02-16 NOTE — Patient Instructions (Signed)
Leuprolide injection What is this medicine? LEUPROLIDE (loo PROE lide) is a man-made hormone. It is used to treat the symptoms of prostate cancer. This medicine may also be used to treat children with early onset of puberty. It may be used for other hormonal conditions. This medicine may be used for other purposes; ask your health care provider or pharmacist if you have questions. COMMON BRAND NAME(S): Lupron What should I tell my health care provider before I take this medicine? They need to know if you have any of these conditions:  diabetes  heart disease or previous heart attack  high blood pressure  high cholesterol  pain or difficulty passing urine  spinal cord metastasis  stroke  tobacco smoker  an unusual or allergic reaction to leuprolide, benzyl alcohol, other medicines, foods, dyes, or preservatives  pregnant or trying to get pregnant  breast-feeding How should I use this medicine? This medicine is for injection under the skin or into a muscle. You will be taught how to prepare and give this medicine. Use exactly as directed. Take your medicine at regular intervals. Do not take your medicine more often than directed. It is important that you put your used needles and syringes in a special sharps container. Do not put them in a trash can. If you do not have a sharps container, call your pharmacist or healthcare provider to get one. A special MedGuide will be given to you by the pharmacist with each prescription and refill. Be sure to read this information carefully each time. Talk to your pediatrician regarding the use of this medicine in children. While this medicine may be prescribed for children as young as 8 years for selected conditions, precautions do apply. Overdosage: If you think you have taken too much of this medicine contact a poison control center or emergency room at once. NOTE: This medicine is only for you. Do not share this medicine with others. What if  I miss a dose? If you miss a dose, take it as soon as you can. If it is almost time for your next dose, take only that dose. Do not take double or extra doses. What may interact with this medicine? Do not take this medicine with any of the following medications:  chasteberry This medicine may also interact with the following medications:  herbal or dietary supplements, like black cohosh or DHEA  female hormones, like estrogens or progestins and birth control pills, patches, rings, or injections  female hormones, like testosterone This list may not describe all possible interactions. Give your health care provider a list of all the medicines, herbs, non-prescription drugs, or dietary supplements you use. Also tell them if you smoke, drink alcohol, or use illegal drugs. Some items may interact with your medicine. What should I watch for while using this medicine? Visit your doctor or health care professional for regular checks on your progress. During the first week, your symptoms may get worse, but then will improve as you continue your treatment. You may get hot flashes, increased bone pain, increased difficulty passing urine, or an aggravation of nerve symptoms. Discuss these effects with your doctor or health care professional, some of them may improve with continued use of this medicine. Female patients may experience a menstrual cycle or spotting during the first 2 months of therapy with this medicine. If this continues, contact your doctor or health care professional. This medicine may increase blood sugar. Ask your healthcare provider if changes in diet or medicines are needed if   you have diabetes. What side effects may I notice from receiving this medicine? Side effects that you should report to your doctor or health care professional as soon as possible:  allergic reactions like skin rash, itching or hives, swelling of the face, lips, or tongue  breathing problems  chest  pain  depression or memory disorders  pain in your legs or groin  pain at site where injected  severe headache  signs and symptoms of high blood sugar such as being more thirsty or hungry or having to urinate more than normal. You may also feel very tired or have blurry vision  swelling of the feet and legs  visual changes  vomiting Side effects that usually do not require medical attention (report to your doctor or health care professional if they continue or are bothersome):  breast swelling or tenderness  decrease in sex drive or performance  diarrhea  hot flashes  loss of appetite  muscle, joint, or bone pains  nausea  redness or irritation at site where injected  skin problems or acne This list may not describe all possible side effects. Call your doctor for medical advice about side effects. You may report side effects to FDA at 1-800-FDA-1088. Where should I keep my medicine? Keep out of the reach of children. Store below 25 degrees C (77 degrees F). Do not freeze. Protect from light. Do not use if it is not clear or if there are particles present. Throw away any unused medicine after the expiration date. NOTE: This sheet is a summary. It may not cover all possible information. If you have questions about this medicine, talk to your doctor, pharmacist, or health care provider.  2020 Elsevier/Gold Standard (2018-04-01 09:52:48)  

## 2020-03-01 ENCOUNTER — Ambulatory Visit: Payer: Self-pay

## 2020-03-06 ENCOUNTER — Telehealth: Payer: Self-pay | Admitting: Medical

## 2020-03-06 NOTE — Telephone Encounter (Signed)
Rescheduled 09/23 to 10/01 due to provider pal and patient requested for 10/01. Patient is notified.

## 2020-03-07 NOTE — Progress Notes (Incomplete)
  Patient Name: Brianna Owens MRN: [id number] DOB: [birth year] Referring Physician: Lurline Del (Profile Not Attached) Date of Service: 02/15/2020 [facility], [place]                                                        End Of Treatment Note  Diagnoses: C50.811-Malignant neoplasm of overlapping sites of right female breast  Cancer Staging: Stage IIIB (ypT0, ypN0) Right Breast LIQ, Invasive Ductal Carcinoma, ER- / PR- / Her2-, Grade 2  Intent: Curative  Radiation Treatment Dates: 01/02/2020 through 02/15/2020 Site Technique Total Dose (Gy) Dose per Fx (Gy) Completed Fx Beam Energies  Breast, Right: Breast_Rt 3D 50.4/50.4 1.8 28/28 6X, 10X  Breast, Right: Breast_Rt_Bst 3D 10/10 2 5/5 6X  Sclav-RT: SCV_Rt 3D 50.4/50.4 1.8 28/28 6X, 10X   Narrative: The patient tolerated radiation therapy relatively well. She did report mild fatigue and numbness of the right breast, both of which improved. She denied chest pain, shortness of breath, and difficulty with range of motion of right arm. She did have some mild darkening of the right breast along with some hyperpigmentation changes and mild erythema. As treatment continued, she developed a more significant radiation reaction in the right supraclavicular region where she had some mild dry desquamation and skin breakdown. She was given samples of triple antibiotic ointment to apply to that area.  Plan: The patient will follow-up with radiation oncology in one month.  ________________________________________________   [name], PhD, MD  This document serves as a record of services personally performed by Gery Pray, MD. It was created on his behalf by Clerance Lav, a trained medical scribe. The creation of this record is based on the scribe's personal observations and  the provider's statements to them. This document has been checked and approved by the attending provider.

## 2020-03-08 ENCOUNTER — Ambulatory Visit: Payer: Self-pay

## 2020-03-08 ENCOUNTER — Inpatient Hospital Stay: Payer: No Typology Code available for payment source | Admitting: Medical

## 2020-03-08 ENCOUNTER — Other Ambulatory Visit: Payer: No Typology Code available for payment source

## 2020-03-08 ENCOUNTER — Ambulatory Visit: Payer: No Typology Code available for payment source | Admitting: Adult Health

## 2020-03-08 ENCOUNTER — Inpatient Hospital Stay: Payer: No Typology Code available for payment source

## 2020-03-13 ENCOUNTER — Ambulatory Visit: Payer: No Typology Code available for payment source | Admitting: Medical

## 2020-03-13 ENCOUNTER — Other Ambulatory Visit: Payer: No Typology Code available for payment source

## 2020-03-13 ENCOUNTER — Ambulatory Visit: Payer: No Typology Code available for payment source

## 2020-03-15 ENCOUNTER — Inpatient Hospital Stay: Payer: No Typology Code available for payment source

## 2020-03-16 ENCOUNTER — Inpatient Hospital Stay: Payer: No Typology Code available for payment source | Attending: Oncology

## 2020-03-16 ENCOUNTER — Inpatient Hospital Stay (HOSPITAL_BASED_OUTPATIENT_CLINIC_OR_DEPARTMENT_OTHER): Payer: No Typology Code available for payment source | Admitting: Medical

## 2020-03-16 ENCOUNTER — Inpatient Hospital Stay: Payer: No Typology Code available for payment source

## 2020-03-16 ENCOUNTER — Other Ambulatory Visit: Payer: Self-pay

## 2020-03-16 VITALS — BP 110/79 | HR 72 | Temp 97.8°F | Resp 17 | Ht 61.0 in | Wt 171.4 lb

## 2020-03-16 DIAGNOSIS — Z01419 Encounter for gynecological examination (general) (routine) without abnormal findings: Secondary | ICD-10-CM

## 2020-03-16 DIAGNOSIS — C50811 Malignant neoplasm of overlapping sites of right female breast: Secondary | ICD-10-CM

## 2020-03-16 DIAGNOSIS — Z95828 Presence of other vascular implants and grafts: Secondary | ICD-10-CM

## 2020-03-16 DIAGNOSIS — Z5111 Encounter for antineoplastic chemotherapy: Secondary | ICD-10-CM | POA: Insufficient documentation

## 2020-03-16 DIAGNOSIS — Z171 Estrogen receptor negative status [ER-]: Secondary | ICD-10-CM

## 2020-03-16 LAB — CBC WITH DIFFERENTIAL/PLATELET
Abs Immature Granulocytes: 0.01 10*3/uL (ref 0.00–0.07)
Basophils Absolute: 0 10*3/uL (ref 0.0–0.1)
Basophils Relative: 1 %
Eosinophils Absolute: 0.3 10*3/uL (ref 0.0–0.5)
Eosinophils Relative: 5 %
HCT: 33.3 % — ABNORMAL LOW (ref 36.0–46.0)
Hemoglobin: 11.5 g/dL — ABNORMAL LOW (ref 12.0–15.0)
Immature Granulocytes: 0 %
Lymphocytes Relative: 37 %
Lymphs Abs: 2.2 10*3/uL (ref 0.7–4.0)
MCH: 31.9 pg (ref 26.0–34.0)
MCHC: 34.5 g/dL (ref 30.0–36.0)
MCV: 92.2 fL (ref 80.0–100.0)
Monocytes Absolute: 0.5 10*3/uL (ref 0.1–1.0)
Monocytes Relative: 9 %
Neutro Abs: 2.9 10*3/uL (ref 1.7–7.7)
Neutrophils Relative %: 48 %
Platelets: 159 10*3/uL (ref 150–400)
RBC: 3.61 MIL/uL — ABNORMAL LOW (ref 3.87–5.11)
RDW: 12.8 % (ref 11.5–15.5)
WBC: 5.9 10*3/uL (ref 4.0–10.5)
nRBC: 0 % (ref 0.0–0.2)

## 2020-03-16 LAB — COMPREHENSIVE METABOLIC PANEL
ALT: 193 U/L — ABNORMAL HIGH (ref 0–44)
AST: 95 U/L — ABNORMAL HIGH (ref 15–41)
Albumin: 4 g/dL (ref 3.5–5.0)
Alkaline Phosphatase: 131 U/L — ABNORMAL HIGH (ref 38–126)
Anion gap: 8 (ref 5–15)
BUN: 12 mg/dL (ref 6–20)
CO2: 25 mmol/L (ref 22–32)
Calcium: 9.9 mg/dL (ref 8.9–10.3)
Chloride: 105 mmol/L (ref 98–111)
Creatinine, Ser: 0.62 mg/dL (ref 0.44–1.00)
GFR calc Af Amer: >60 mL/min (ref >60)
GFR calc non Af Amer: >60 mL/min (ref >60)
Glucose, Bld: 90 mg/dL (ref 70–99)
Potassium: 4.4 mmol/L (ref 3.5–5.1)
Sodium: 138 mmol/L (ref 135–145)
Total Bilirubin: 0.4 mg/dL (ref 0.3–1.2)
Total Protein: 8.3 g/dL — ABNORMAL HIGH (ref 6.5–8.1)

## 2020-03-16 LAB — PREGNANCY, URINE: Preg Test, Ur: NEGATIVE — AB

## 2020-03-16 MED ORDER — LEUPROLIDE ACETATE 3.75 MG IM KIT
3.7500 mg | PACK | Freq: Once | INTRAMUSCULAR | Status: AC
  Administered 2020-03-16: 3.75 mg via INTRAMUSCULAR
  Filled 2020-03-16: qty 3.75

## 2020-03-16 NOTE — Progress Notes (Signed)
[facility] Progress Note   Brianna Owens [id number] 06/03/1991 29 y.o.  Brianna Owens is managed by Dr. Lurline Del  Actively treated with chemotherapy/immunotherapy/hormonal therapy: yes  Current therapy: adjuvant radiation therapy; continuing Luprolide  Last treated: 02/16/2020 (treatment # 19)  Next scheduled appointment with provider: to be scheduled  Assessment: Plan:    Malignant neoplasm of overlapping sites of right breast in female, estrogen receptor negative ([place])   Estrogen receptor negative malignant neoplasm of the right breast: The patient presents today for continued dosing with Luprolide.   A follow-up appointment with Dr. Jana Hakim needs to be scheduled.  Please see After Visit Summary for patient specific instructions.  Future Appointments  Date Time Provider [place]  03/16/2020 10:00 AM [facility] None  03/16/2020 10:30 AM Korrine Sicard, Lucianne Lei E., PA-C [facility] None  03/16/2020 11:15 AM [facility] [place] FLUSH [facility] None  03/22/2020  8:00 AM Gery Pray, MD [facility] None  04/12/2020 12:15 PM [facility] [place] None  05/09/2020 12:45 PM [facility] [place] [facility] None    No orders of the defined types were placed in this encounter.      Subjective:   Patient ID:  Brianna Owens is a 29 y.o. (DOB [birth year]) female.  Chief Complaint: No chief complaint on file.   HPI Bre Wynn Owens is a 29 y.o. female with a diagnosis of an Estrogen receptor negative malignant neoplasm of the right breast.  She continues to be followed by Dr. Jana Hakim and presents today for continued dosing with Luprolide.  She reports that she has occasional right breast pain but otherwise is doing well.  Her last radiation treatment was on 02/15/2020.  She was seen with the use of an interpreter today.  She denies fevers, chills, sweats, nausea, vomiting, constipation, or  diarrhea.  Her appetite is good.      Medications: I have reviewed the patient's current medications.  Allergies: No Known Allergies  Past Medical History:  Diagnosis Date  . Cancer ([place]) 05/05/2019    Past Surgical History:  Procedure Laterality Date  . BREAST LUMPECTOMY WITH RADIOACTIVE SEED AND SENTINEL LYMPH NODE BIOPSY Right 11/17/2019   Procedure: RIGHT BREAST LUMPECTOMY WITH RADIOACTIVE SEED;  Surgeon: Erroll Luna, MD;  Location: [place];  Service: General;  Laterality: Right;  PEC BLOCK  . IR IMAGING GUIDED PORT INSERTION  05/24/2019  . SENTINEL NODE BIOPSY Right 11/17/2019   Procedure: Sentinel Node Biopsy;  Surgeon: Erroll Luna, MD;  Location: [place];  Service: General;  Laterality: Right;    Family History  Problem Relation Age of Onset  . Breast cancer Mother   . Breast cancer Maternal Aunt     Social History   Socioeconomic History  . Marital status: Single    Spouse name: Not on file  . Number of children: Not on file  . Years of education: Not on file  . Highest education level: 9th grade  Occupational History  . Not on file  Tobacco Use  . Smoking status: Never Smoker  . Smokeless tobacco: Never Used  Vaping Use  . Vaping Use: Never used  Substance and Sexual Activity  . Alcohol use: Not Currently  . Drug use: Not Currently  . Sexual activity: Yes    Birth control/protection: I.U.D.  Other Topics Concern  . Not on file  Social History Narrative  . Not on file   Social Determinants of Health   Financial Resource Strain:   . Difficulty of Paying  Living Expenses: Not on file  Food Insecurity:   . Worried About Charity fundraiser in the Last Year: Not on file  . Ran Out of Food in the Last Year: Not on file  Transportation Needs: Unmet Transportation Needs  . Lack of Transportation (Medical): Yes  . Lack of Transportation (Non-Medical): No  Physical Activity:   . Days of Exercise per Week: Not on file  . Minutes of Exercise per Session: Not  on file  Stress:   . Feeling of Stress : Not on file  Social Connections:   . Frequency of Communication with Friends and Family: Not on file  . Frequency of Social Gatherings with Friends and Family: Not on file  . Attends Religious Services: Not on file  . Active Member of Clubs or Organizations: Not on file  . Attends Archivist Meetings: Not on file  . Marital Status: Not on file  Intimate Partner Violence:   . Fear of Current or Ex-Partner: Not on file  . Emotionally Abused: Not on file  . Physically Abused: Not on file  . Sexually Abused: Not on file    Past Medical History, Surgical history, Social history, and Family history were reviewed and updated as appropriate.   Please see review of systems for further details on the patient's review from today.   Review of Systems:  Review of Systems  Constitutional: Negative for chills, diaphoresis and fever.  HENT: Negative for trouble swallowing and voice change.   Respiratory: Negative for cough, chest tightness, shortness of breath and wheezing.   Cardiovascular: Negative for chest pain and palpitations.  Gastrointestinal: Negative for abdominal pain, constipation, diarrhea, nausea and vomiting.  Musculoskeletal: Negative for back pain and myalgias.  Neurological: Negative for dizziness, light-headedness and headaches.    Objective:   Physical Exam:  There were no vitals taken for this visit. ECOG: 0  Physical Exam Constitutional:      General: She is not in acute distress.    Appearance: She is not diaphoretic.  HENT:     Head: Normocephalic and atraumatic.  Cardiovascular:     Rate and Rhythm: Normal rate and regular rhythm.     Heart sounds: Normal heart sounds. No murmur heard.  No friction rub. No gallop.   Pulmonary:     Effort: Pulmonary effort is normal. No respiratory distress.     Breath sounds: Normal breath sounds. No wheezing or rales.  Skin:    General: Skin is warm and dry.      Findings: No erythema or rash.  Neurological:     Mental Status: She is alert.     Lab Review:     Component Value Date/Time   NA 137 11/24/2019 1318   K 3.8 11/24/2019 1318   CL 102 11/24/2019 1318   CO2 27 11/24/2019 1318   GLUCOSE 107 (H) 11/24/2019 1318   BUN 9 11/24/2019 1318   CREATININE 0.63 11/24/2019 1318   CREATININE 0.45 07/21/2019 1025   CALCIUM 9.5 11/24/2019 1318   PROT 7.7 11/24/2019 1318   ALBUMIN 3.8 11/24/2019 1318   AST 83 (H) 11/24/2019 1318   AST 29 07/21/2019 1025   ALT 196 (H) 11/24/2019 1318   ALT 58 (H) 07/21/2019 1025   ALKPHOS 110 11/24/2019 1318   BILITOT 0.4 11/24/2019 1318   BILITOT 0.3 07/21/2019 1025   GFRNONAA >60 11/24/2019 1318   GFRNONAA >60 07/21/2019 1025   GFRAA >60 11/24/2019 1318   GFRAA >60 07/21/2019 1025  Component Value Date/Time   WBC 6.0 11/24/2019 1318   RBC 3.36 (L) 11/24/2019 1318   HGB 10.9 (L) 11/24/2019 1318   HCT 32.5 (L) 11/24/2019 1318   PLT 210 11/24/2019 1318   MCV 96.7 11/24/2019 1318   MCH 32.4 11/24/2019 1318   MCHC 33.5 11/24/2019 1318   RDW 13.1 11/24/2019 1318   LYMPHSABS 1.6 11/24/2019 1318   MONOABS 0.7 11/24/2019 1318   EOSABS 0.2 11/24/2019 1318   BASOSABS 0.0 11/24/2019 1318   -------------------------------  Imaging from last 24 hours (if applicable):  Radiology interpretation: No results found.

## 2020-03-22 ENCOUNTER — Ambulatory Visit: Payer: No Typology Code available for payment source | Admitting: Radiation Oncology

## 2020-03-22 ENCOUNTER — Ambulatory Visit: Payer: No Typology Code available for payment source | Attending: Radiation Oncology | Admitting: Radiation Oncology

## 2020-03-29 ENCOUNTER — Ambulatory Visit
Admission: RE | Admit: 2020-03-29 | Discharge: 2020-03-29 | Disposition: A | Discharge status: Home / Self Care | Payer: No Typology Code available for payment source | Source: Ambulatory Visit | Attending: Radiation Oncology | Admitting: Radiation Oncology

## 2020-03-29 ENCOUNTER — Other Ambulatory Visit: Payer: Self-pay

## 2020-03-29 ENCOUNTER — Encounter: Payer: Self-pay | Admitting: Radiation Oncology

## 2020-03-29 VITALS — BP 113/83 | HR 72 | Temp 96.1°F | Resp 18 | Ht 61.0 in | Wt 169.1 lb

## 2020-03-29 DIAGNOSIS — Z1501 Genetic susceptibility to malignant neoplasm of breast: Secondary | ICD-10-CM | POA: Insufficient documentation

## 2020-03-29 DIAGNOSIS — Z923 Personal history of irradiation: Secondary | ICD-10-CM | POA: Insufficient documentation

## 2020-03-29 DIAGNOSIS — Z1502 Genetic susceptibility to malignant neoplasm of ovary: Secondary | ICD-10-CM | POA: Insufficient documentation

## 2020-03-29 DIAGNOSIS — Z853 Personal history of malignant neoplasm of breast: Secondary | ICD-10-CM | POA: Insufficient documentation

## 2020-03-29 DIAGNOSIS — C50811 Malignant neoplasm of overlapping sites of right female breast: Secondary | ICD-10-CM

## 2020-03-29 DIAGNOSIS — Z171 Estrogen receptor negative status [ER-]: Secondary | ICD-10-CM

## 2020-03-29 NOTE — Progress Notes (Signed)
Patient here for a 1 month f/u visit with Dr. Sondra Come. Reports pain that is intermittent at her right breast at her incision. Patient denies any other problems.  BP 113/83 (BP Location: Left Arm)   Pulse 72   Temp (!) 96.1 F (35.6 C) (Tympanic)   Resp 18   Ht 5\' 1"  (1.549 m)   Wt 169 lb 2 oz (76.7 kg)   SpO2 100%   BMI 31.96 kg/m    Wt Readings from Last 3 Encounters:  03/29/20 169 lb 2 oz (76.7 kg)  03/16/20 171 lb 6.4 oz (77.7 kg)  01/19/20 177 lb 6.4 oz (80.5 kg)

## 2020-03-29 NOTE — Progress Notes (Signed)
Radiation Oncology         [phone] ________________________________  Name: Brianna Owens MRN: [id number]  Date: 03/29/2020  DOB: [birth year]  Follow-Up Visit Note  CC: Patient, [name]  Magrinat, [name], MD    ICD-10-CM   1. 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 Malignant neoplasm of overlapping sites of right breast in female, estrogen receptor negative ([place])  C50.811    Z17.1     Diagnosis: StageIIIB(ypT0, ypN0)RightBreast LIQ,Invasive DuctalCarcinoma, ER-/ PR-/ Her2-, Grade2  Interval Since Last Radiation: One month, one week, and six days  Radiation Treatment Dates: 01/02/2020 through 02/15/2020 Site Technique Total Dose (Gy) Dose per Fx (Gy) Completed Fx Beam Energies  Breast, Right: Breast_Rt 3D 50.4/50.4 1.8 28/28 6X, 10X  Breast, Right: Breast_Rt_Bst 3D 10/10 2 5/5 6X  Sclav-RT: SCV_Rt 3D 50.4/50.4 1.8 28/28 6X, 10X    Narrative:  The patient returns today for routine follow-up. Since the end of treatment, she was seen by Sandi Mealy, PA-C, on 03/16/2020 for continued dosing with Luprolide. No other significant interval history.  On review of systems, she reports intermittent right breast pain at incision site. She reports no other complaints.  She will occasionally take Tylenol for this breast discomfort.  She denies any nipple discharge or bleeding.  She denies any problems with swelling in her right arm or hand  ALLERGIES:  has No Known Allergies.  Meds: Current Outpatient Medications  Medication Sig Dispense Refill  . ibuprofen (ADVIL) 800 MG tablet Take 1 tablet (800 mg total) by mouth every 8 (eight) hours as needed. 30 tablet 0   No current facility-administered medications for this encounter.    Physical Findings: The patient is in no acute distress. Patient is alert and oriented. Lungs are clear to auscultation bilaterally. Heart has regular rate and rhythm.  No palpable cervical, supraclavicular, or axillary adenopathy. Abdomen soft, non-tender, normal bowel sounds. Left breast: No palpable mass, nipple discharge, or bleeding.  Right breast: Skin is well-healed.  Mild hyperpigmentation changes noted throughout the breast.  No dominant mass appreciated in the breast nipple discharge or bleeding.    height is '5\' 1"'  (1.549 m) and weight is 169 lb 2 oz (76.7 kg). Her tympanic temperature is 96.1 F (35.6 C) (abnormal). Her blood pressure is 113/83 and her pulse is 72. Her respiration is 18 and oxygen saturation is 100%.    Lab Findings: Lab Results  Component Value Date   WBC 5.9 03/16/2020   HGB 11.5 (L) 03/16/2020   HCT 33.3 (L) 03/16/2020   MCV 92.2 03/16/2020   PLT 159 03/16/2020    Radiographic Findings: No results found.  Impression: StageIIIB(ypT0, ypN0)RightBreast LIQ,Invasive DuctalCarcinoma, ER-/ PR-/ Her2-, Grade2  The patient is recovering well from her radiation therapy.  No signs of recurrence on clinical exam today.  Plan: The patient is scheduled to follow up with Dr. Jana Hakim on 05/09/2020. She will follow up with radiation oncology on a as needed basis in light of her close follow-up with medical oncology months.   ____________________________________   [name], PhD, MD  This document serves as a record of services personally performed by Gery Pray, MD. It was created on his behalf by Clerance Lav, a trained medical scribe. The creation of this record is based on the scribe's personal observations and the provider's statements to them. This document has been checked and approved by the attending provider.

## 2020-04-12 ENCOUNTER — Other Ambulatory Visit: Payer: Self-pay

## 2020-04-12 ENCOUNTER — Inpatient Hospital Stay: Payer: Self-pay

## 2020-04-12 VITALS — BP 123/81 | HR 73 | Temp 97.3°F | Resp 18

## 2020-04-12 DIAGNOSIS — Z01419 Encounter for gynecological examination (general) (routine) without abnormal findings: Secondary | ICD-10-CM

## 2020-04-12 DIAGNOSIS — C50811 Malignant neoplasm of overlapping sites of right female breast: Secondary | ICD-10-CM

## 2020-04-12 DIAGNOSIS — Z95828 Presence of other vascular implants and grafts: Secondary | ICD-10-CM

## 2020-04-12 DIAGNOSIS — Z171 Estrogen receptor negative status [ER-]: Secondary | ICD-10-CM

## 2020-04-12 MED ORDER — LEUPROLIDE ACETATE 3.75 MG IM KIT
3.7500 mg | PACK | Freq: Once | INTRAMUSCULAR | Status: AC
  Administered 2020-04-12: 3.75 mg via INTRAMUSCULAR
  Filled 2020-04-12: qty 3.75

## 2020-04-12 NOTE — Patient Instructions (Signed)
Leuprolide injection What is this medicine? LEUPROLIDE (loo PROE lide) is a man-made hormone. It is used to treat the symptoms of prostate cancer. This medicine may also be used to treat children with early onset of puberty. It may be used for other hormonal conditions. This medicine may be used for other purposes; ask your health care provider or pharmacist if you have questions. COMMON BRAND NAME(S): Lupron What should I tell my health care provider before I take this medicine? They need to know if you have any of these conditions:  diabetes  heart disease or previous heart attack  high blood pressure  high cholesterol  pain or difficulty passing urine  spinal cord metastasis  stroke  tobacco smoker  an unusual or allergic reaction to leuprolide, benzyl alcohol, other medicines, foods, dyes, or preservatives  pregnant or trying to get pregnant  breast-feeding How should I use this medicine? This medicine is for injection under the skin or into a muscle. You will be taught how to prepare and give this medicine. Use exactly as directed. Take your medicine at regular intervals. Do not take your medicine more often than directed. It is important that you put your used needles and syringes in a special sharps container. Do not put them in a trash can. If you do not have a sharps container, call your pharmacist or healthcare provider to get one. A special MedGuide will be given to you by the pharmacist with each prescription and refill. Be sure to read this information carefully each time. Talk to your pediatrician regarding the use of this medicine in children. While this medicine may be prescribed for children as young as 8 years for selected conditions, precautions do apply. Overdosage: If you think you have taken too much of this medicine contact a poison control center or emergency room at once. NOTE: This medicine is only for you. Do not share this medicine with others. What if  I miss a dose? If you miss a dose, take it as soon as you can. If it is almost time for your next dose, take only that dose. Do not take double or extra doses. What may interact with this medicine? Do not take this medicine with any of the following medications:  chasteberry This medicine may also interact with the following medications:  herbal or dietary supplements, like black cohosh or DHEA  female hormones, like estrogens or progestins and birth control pills, patches, rings, or injections  female hormones, like testosterone This list may not describe all possible interactions. Give your health care provider a list of all the medicines, herbs, non-prescription drugs, or dietary supplements you use. Also tell them if you smoke, drink alcohol, or use illegal drugs. Some items may interact with your medicine. What should I watch for while using this medicine? Visit your doctor or health care professional for regular checks on your progress. During the first week, your symptoms may get worse, but then will improve as you continue your treatment. You may get hot flashes, increased bone pain, increased difficulty passing urine, or an aggravation of nerve symptoms. Discuss these effects with your doctor or health care professional, some of them may improve with continued use of this medicine. Female patients may experience a menstrual cycle or spotting during the first 2 months of therapy with this medicine. If this continues, contact your doctor or health care professional. This medicine may increase blood sugar. Ask your healthcare provider if changes in diet or medicines are needed if   you have diabetes. What side effects may I notice from receiving this medicine? Side effects that you should report to your doctor or health care professional as soon as possible:  allergic reactions like skin rash, itching or hives, swelling of the face, lips, or tongue  breathing problems  chest  pain  depression or memory disorders  pain in your legs or groin  pain at site where injected  severe headache  signs and symptoms of high blood sugar such as being more thirsty or hungry or having to urinate more than normal. You may also feel very tired or have blurry vision  swelling of the feet and legs  visual changes  vomiting Side effects that usually do not require medical attention (report to your doctor or health care professional if they continue or are bothersome):  breast swelling or tenderness  decrease in sex drive or performance  diarrhea  hot flashes  loss of appetite  muscle, joint, or bone pains  nausea  redness or irritation at site where injected  skin problems or acne This list may not describe all possible side effects. Call your doctor for medical advice about side effects. You may report side effects to FDA at 1-800-FDA-1088. Where should I keep my medicine? Keep out of the reach of children. Store below 25 degrees C (77 degrees F). Do not freeze. Protect from light. Do not use if it is not clear or if there are particles present. Throw away any unused medicine after the expiration date. NOTE: This sheet is a summary. It may not cover all possible information. If you have questions about this medicine, talk to your doctor, pharmacist, or health care provider.  2020 Elsevier/Gold Standard (2018-04-01 09:52:48)  

## 2020-04-19 NOTE — Progress Notes (Signed)
..  The following Assist/Replace Program for [place] from [place] has been terminated due to treatment complete 07/09/2019.  Last DOS:07/09/2019

## 2020-05-08 NOTE — Progress Notes (Signed)
[place]  Telephone:([phone] Fax:(336) [phone]     ID: Brianna Owens DOB: Oct 27, 1990  MR#: [id number]  YFV#:[id number]  Patient Care Team: Patient, [name] as PCP - General (General Practice) Darren Caldron, [name], MD as Consulting Physician (Oncology) Erroll Luna, MD as Consulting Physician (General Surgery) [name], RN as Oncology Nurse Navigator [name], RN as Oncology Nurse Navigator Eppie Gibson, MD as Attending Physician (Radiation Oncology) Chauncey Cruel, MD OTHER MD:  CHIEF COMPLAINT: Triple negative breast cancer, BRCA1 positive  CURRENT TREATMENT: continuing Luprolide   INTERVAL HISTORY: Brianna Owens returns today for follow up of her triple negative breast cancer.  She is accompanied by a [place] interpreter.  Since her last visit, she completed radiation therapy on 02/15/2020.  She tolerated it well, with no significant complications.  Her most recent Lupron shot was given on 04/12/2020.  She has a shot due today.  She does not have hot flashes or other significant symptoms of menopause   REVIEW OF SYSTEMS: Brianna Owens is very stable at present.  She works every other weekend and makes enough to pay for her rent and take care of her 16-year-old daughter.  When she is not working she goes on walks in the park she says.  She is not having any symptoms of menopause including hot flashes or vaginal dryness issues.  She tells me she is done with childbearing and does not plan any further children.   COVID 19 VACCINATION STATUS: Status post Pfizer x2 most recently August 2021    HISTORY OF CURRENT ILLNESS: From the original intake note:  [facility] with a 3 month history of a right breast lump that became painful in early 04/2019. Physical exam performed at that time showed a palpable, tender 13 cm lump within the right center breast under the  nipple area. She underwent right breast ultrasonography at [facility] on 04/27/2019 showing: 6.1 cm palpable mass centered in the 5 o'clock position of the right breast; single right inferomedial axillary lymph node with focal cortical thickening inferiorly.  Accordingly on 05/04/2019 she proceeded to biopsy of the right breast area in question. The pathology from this procedure ([id number]) showed: invasive ductal carcinoma, grade 2-3. Prognostic indicators significant for: estrogen receptor, 0% negative and progesterone receptor, 0% negative. Proliferation marker Ki67 at 40%.  I do not find HER-2 receptor documentation  The right axillary lymph node biopsied at that time showed reactive germinal centers.  She underwent bilateral diagnostic mammography with tomography at [facility] on 05/05/2019 showing: breast density category D; biopsy-proven right breast malignancy measures 6.1 cm; no additional masses or calcifications seen in the right breast; no evidence of malignancy in the left breast.  The patient's subsequent history is as detailed below.   PAST MEDICAL HISTORY: Past Medical History:  Diagnosis Date  . Cancer ([place]) 05/05/2019    PAST SURGICAL HISTORY: Past Surgical History:  Procedure Laterality Date  . BREAST LUMPECTOMY WITH RADIOACTIVE SEED AND SENTINEL LYMPH NODE BIOPSY Right 11/17/2019   Procedure: RIGHT BREAST LUMPECTOMY WITH RADIOACTIVE SEED;  Surgeon: Erroll Luna, MD;  Location: [facility];  Service: General;  Laterality: Right;  PEC BLOCK  . IR IMAGING GUIDED PORT INSERTION  05/24/2019  . SENTINEL NODE BIOPSY Right 11/17/2019   Procedure: Sentinel Node Biopsy;  Surgeon: Erroll Luna, MD;  Location: [facility];  Service: General;  Laterality: Right;    FAMILY HISTORY: Family History  Problem Relation Age of Onset  . Breast cancer Mother   . Breast cancer Maternal Aunt    Patient's father is 36 and her mother 32 as of November 2020.  The patient's mother was  diagnosed with breast cancer in her early 16s.  She lives in [place]. The patient's mother's sister was also diagnosed with breast cancer in her early 50s.  The patient herself has 1 sister, no brothers.  She is not aware of any ovarian cancer cases in the family but tells me "all the women in my family had their ovaries removed".   GYNECOLOGIC HISTORY:  No LMP recorded. (Menstrual status: Irregular Periods). Menarche: 29 years old Age at first live birth: 29 years old [place] P 2 LMP regular Contraceptive HRT n/a  Hysterectomy? no BSO? no   SOCIAL HISTORY: (updated 04/2020)  Brianna Owens normally works at [employer], 12-hour days, Saturday and Sunday every other weekend.  She is originally from [place].  She is divorced.  Her children are [name], 37 years old, living in [place] with his father, and [name], 85 years old, who lives with the patient.    ADVANCED DIRECTIVES: Not in place   HEALTH MAINTENANCE: Social History   Tobacco Use  . Smoking status: Never Smoker  . Smokeless tobacco: Never Used  Vaping Use  . Vaping Use: Never used  Substance Use Topics  . Alcohol use: Not Currently  . Drug use: Not Currently     Colonoscopy: n/a  PAP: 04/26/2019, negative  Bone density: n/a   No Known Allergies  Current Outpatient Medications  Medication Sig Dispense Refill  . ibuprofen (ADVIL) 800 MG tablet Take 1 tablet (800 mg total) by mouth every 8 (eight) hours as needed. 30 tablet 0   No current facility-administered medications for this visit.    OBJECTIVE:  Spanish speaker in no acute distress  Vitals:   05/09/20 1159  BP: 103/62  Pulse: 73  Resp: 18  Temp: 98 F (36.7 C)  SpO2: 100%     Body mass index is 31.04 kg/m.   Wt Readings from Last 3 Encounters:  05/09/20 164 lb 4.8 oz (74.5 kg)  03/29/20 169 lb 2 oz (76.7 kg)  03/16/20 171 lb 6.4 oz (77.7 kg)      ECOG FS:1 - Symptomatic but completely ambulatory  Sclerae unicteric, EOMs intact Wearing a mask No cervical  or supraclavicular adenopathy Lungs no rales or rhonchi Heart regular rate and rhythm Abd soft, nontender, positive bowel sounds MSK no focal spinal tenderness, no upper extremity lymphedema Neuro: nonfocal, well oriented, appropriate affect Breasts: The right breast is status post lumpectomy and radiation.  There is some coarsening of the skin as expected.  There is no evidence of residual recurrent disease.  The left breast is benign.  Both axillae are benign.   LAB RESULTS:  CMP     Component Value Date/Time   NA 138 03/16/2020 1011   K 4.4 03/16/2020 1011   CL 105 03/16/2020 1011   CO2 25 03/16/2020 1011   GLUCOSE 90 03/16/2020 1011   BUN 12 03/16/2020 1011   CREATININE 0.62 03/16/2020 1011   CREATININE 0.45 07/21/2019 1025   CALCIUM 9.9 03/16/2020 1011   PROT 8.3 (H) 03/16/2020 1011   ALBUMIN 4.0 03/16/2020 1011   AST 95 (H) 03/16/2020 1011   AST 29 07/21/2019 1025   ALT 193 (H) 03/16/2020 1011   ALT 58 (H) 07/21/2019 1025   ALKPHOS 131 (H) 03/16/2020 1011   BILITOT  0.4 03/16/2020 1011   BILITOT 0.3 07/21/2019 1025   GFRNONAA >60 03/16/2020 1011   GFRNONAA >60 07/21/2019 1025   GFRAA >60 03/16/2020 1011   GFRAA >60 07/21/2019 1025    No results found for: TOTALPROTELP, ALBUMINELP, A1GS, A2GS, BETS, BETA2SER, GAMS, MSPIKE, SPEI  No results found for: KPAFRELGTCHN, LAMBDASER, KAPLAMBRATIO  Lab Results  Component Value Date   WBC 5.9 03/16/2020   NEUTROABS 2.9 03/16/2020   HGB 11.5 (L) 03/16/2020   HCT 33.3 (L) 03/16/2020   MCV 92.2 03/16/2020   PLT 159 03/16/2020    No results found for: LABCA2  No components found for: [id number]  No results for input(s): INR in the last 168 hours.  No results found for: LABCA2  No results found for: VOJ500  No results found for: XFG182  No results found for: XHB716  No results found for: CA2729  No components found for: HGQUANT  No results found for: CEA1 / No results found for: CEA1   No results found for:  AFPTUMOR  No results found for: CHROMOGRNA  No results found for: HGBA, HGBA2QUANT, HGBFQUANT, HGBSQUAN (Hemoglobinopathy evaluation)   No results found for: LDH  No results found for: IRON, TIBC, IRONPCTSAT (Iron and TIBC)  No results found for: FERRITIN  Urinalysis No results found for: COLORURINE, APPEARANCEUR, LABSPEC, PHURINE, GLUCOSEU, HGBUR, BILIRUBINUR, KETONESUR, PROTEINUR, UROBILINOGEN, NITRITE, LEUKOCYTESUR   STUDIES: No results found.   ELIGIBLE FOR AVAILABLE RESEARCH PROTOCOL:no  ASSESSMENT: 29 y.o. BRCA1 positive Brianna Owens woman status post right breast overlapping sites biopsy 05/04/2019 for a clinical T3 N0, stage IIIB invasive ductal carcinoma, grade 2, triple negative, with an MIB-1 of 40%.  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  The Common Hereditary Gene Panel offered by [facility] includes sequencing and/or deletion duplication testing of the following 48 genes: APC, ATM, AXIN2, BARD1, BMPR1A, BRCA1, BRCA2, BRIP1, CDH1, CDK4, CDKN2A (p14ARF), CDKN2A (p16INK4a), CHEK2, CTNNA1, DICER1, EPCAM (Deletion/duplication testing only), GREM1 (promoter region deletion/duplication testing only), KIT, MEN1, MLH1, MSH2, MSH3, MSH6, MUTYH, NBN, NF1, NHTL1, PALB2, PDGFRA, PMS2, POLD1, POLE, PTEN, RAD50, RAD51C, RAD51D, RNF43, SDHB, SDHC, SDHD, SMAD4, SMARCA4. STK11, TP53, TSC1, TSC2, and VHL.  The following genes were evaluated for sequence changes only: SDHA and HOXB13 c.251G>A variant only. The report date is 05/24/2019.  (b) patient recommended bilateral mastectomies, due to potential barriers/risks with age and intensified screening  (2) neoadjuvant chemotherapy consisting of doxorubicin and cyclophosphamide in dose dense fashion x4 started 05/26/2019, completed 07/07/2019, followed by  paclitaxel and carboplatin weekly x12 started 07/21/2019  (a) breast MRI 09/26/2019 shows a complete radiologic response  (3) status post right lumpectomy 11/17/2019 showing a complete pathologic response [ypT0 ypN0]  (a) a total of 5 axillary lymph nodes were removed  (4) adjuvant radiation   Radiation Treatment Dates: 01/02/2020 through 02/15/2020 Site Technique Total Dose (Gy) Dose per Fx (Gy) Completed Fx Beam Energies  Breast, Right: Breast_Rt 3D 50.4/50.4 1.8 28/28 6X, 10X  Breast, Right: Breast_Rt_Bst 3D 10/10 2 5/5 6X  Sclav-RT: SCV_Rt 3D 50.4/50.4 1.8 28/28 6X, 10X    (5) consider bilateral salpingo-oophorectomy for ovarian cancer prevention  (a) receiving leuprolide/Lupron every 28 days   PLAN: Brianna Owens tolerated her radiation treatments well.  She does well with the leuprolide which she receives today and every 4 weeks.  She is not aware of any menopausal symptoms.  She has 2 children and does not want anymore she says.  We discussed bilateral salpingo-oophorectomy.  She understands we do not have screening for ovarian cancer and that the Lupron she is receiving does not protect her from that although it does "turn off" her ovaries.  She is functionally menopausal at present and tolerating that well.  I have placed a referral to [facility] so they can schedule the patient at their discretion.  Whenever the surgery takes place she would like her port to be removed at the same time  I have scheduling her for mammography in December.  She will see me again in 12 weeks and we will set her up for breast MRI sometime in May or June  She knows to call for any other issue that may develop before the next visit  Total encounter time 30 minutes.Brianna Jews C. Brayah Urquilla, MD 05/09/20 12:33 PM Medical Oncology and Hematology [facility] [place], [place] [address] Tel. [phone]    Fax. [phone]   I, Wilburn Mylar, am acting as scribe for Dr.  [name]. Brianna Owens.  I, Lurline Del MD, have reviewed the above documentation for accuracy and completeness, and I agree with the above.   *Total Encounter Time as defined by the Centers for Medicare and Medicaid Services includes, in addition to the face-to-face time of a patient visit (documented in the note above) non-face-to-face time: obtaining and reviewing outside history, ordering and reviewing medications, tests or procedures, care coordination (communications with other health care professionals or caregivers) and documentation in the medical record.

## 2020-05-09 ENCOUNTER — Telehealth: Payer: Self-pay | Admitting: Oncology

## 2020-05-09 ENCOUNTER — Inpatient Hospital Stay: Payer: Self-pay | Attending: Oncology

## 2020-05-09 ENCOUNTER — Other Ambulatory Visit: Payer: Self-pay

## 2020-05-09 ENCOUNTER — Inpatient Hospital Stay (HOSPITAL_BASED_OUTPATIENT_CLINIC_OR_DEPARTMENT_OTHER): Payer: Self-pay | Admitting: Oncology

## 2020-05-09 VITALS — BP 103/62 | HR 73 | Temp 98.0°F | Resp 18 | Ht 61.0 in | Wt 164.3 lb

## 2020-05-09 DIAGNOSIS — Z171 Estrogen receptor negative status [ER-]: Secondary | ICD-10-CM

## 2020-05-09 DIAGNOSIS — Z923 Personal history of irradiation: Secondary | ICD-10-CM | POA: Insufficient documentation

## 2020-05-09 DIAGNOSIS — Z95828 Presence of other vascular implants and grafts: Secondary | ICD-10-CM

## 2020-05-09 DIAGNOSIS — C50311 Malignant neoplasm of lower-inner quadrant of right female breast: Secondary | ICD-10-CM | POA: Insufficient documentation

## 2020-05-09 DIAGNOSIS — Z01419 Encounter for gynecological examination (general) (routine) without abnormal findings: Secondary | ICD-10-CM

## 2020-05-09 DIAGNOSIS — Z1501 Genetic susceptibility to malignant neoplasm of breast: Secondary | ICD-10-CM

## 2020-05-09 DIAGNOSIS — C50811 Malignant neoplasm of overlapping sites of right female breast: Secondary | ICD-10-CM

## 2020-05-09 DIAGNOSIS — Z803 Family history of malignant neoplasm of breast: Secondary | ICD-10-CM | POA: Insufficient documentation

## 2020-05-09 DIAGNOSIS — Z5111 Encounter for antineoplastic chemotherapy: Secondary | ICD-10-CM | POA: Insufficient documentation

## 2020-05-09 DIAGNOSIS — Z1502 Genetic susceptibility to malignant neoplasm of ovary: Secondary | ICD-10-CM

## 2020-05-09 MED ORDER — LEUPROLIDE ACETATE 3.75 MG IM KIT
3.7500 mg | PACK | Freq: Once | INTRAMUSCULAR | Status: AC
  Administered 2020-05-09: 3.75 mg via INTRAMUSCULAR
  Filled 2020-05-09: qty 3.75

## 2020-05-09 NOTE — Addendum Note (Signed)
Addended by: Chauncey Cruel on: 05/09/2020 01:07 PM   Modules accepted: Orders

## 2020-05-09 NOTE — Telephone Encounter (Signed)
Scheduled appts per 11/24 los. Gave pt a print out of AVS.

## 2020-05-14 ENCOUNTER — Other Ambulatory Visit: Payer: Self-pay | Admitting: *Deleted

## 2020-05-14 DIAGNOSIS — Z853 Personal history of malignant neoplasm of breast: Secondary | ICD-10-CM

## 2020-05-21 ENCOUNTER — Telehealth: Payer: Self-pay | Admitting: *Deleted

## 2020-05-21 NOTE — Telephone Encounter (Signed)
Called and talked with the patient regarding her new patient referral. Patient stated "I want to wait a couple months. It is to much to think about having my ovaries removed right now. I do want the port removed." Explained that the message would be given to Dr Jana Hakim

## 2020-05-22 ENCOUNTER — Other Ambulatory Visit: Payer: Self-pay | Admitting: Oncology

## 2020-05-22 NOTE — Progress Notes (Unsigned)
The patient canceled her appointment with gynecological oncology and thinks she may want to consider having her ovaries removed next year perhaps in February.  She stated she did want the port removed.  She continues on goserelin.  She is scheduled to see me in February.  I have let Dr. Brantley Stage now that she would like her port removed

## 2020-05-29 ENCOUNTER — Other Ambulatory Visit: Payer: Self-pay

## 2020-05-29 ENCOUNTER — Ambulatory Visit: Payer: Self-pay | Admitting: *Deleted

## 2020-05-29 VITALS — BP 128/98 | Wt 160.9 lb

## 2020-05-29 DIAGNOSIS — Z1239 Encounter for other screening for malignant neoplasm of breast: Secondary | ICD-10-CM

## 2020-05-29 NOTE — Patient Instructions (Signed)
Explained breast self awareness with Tran Wynn Banker. Patient did not need a Pap smear today due to last Pap smear was 04/26/2019. Let her know BCCCP will cover Pap smears every 3 years unless has a history of abnormal Pap smears. Referred patient to the [facility] for a diagnostic mammogram per recommendation. Appointment scheduled Thursday, May 31, 2020 at 1250. Patient aware of appointment and will be there. [place] verbalized understanding.  Maveric Debono, Arvil Chaco, RN 3:00 PM

## 2020-05-29 NOTE — Progress Notes (Signed)
[name] is a 29 y.o. female who presents to [facility] clinic today with complaint of right breast pain around twice daily. Patient rates the pain at a 5 out of 10. Patient has a history of right breast invasive ductal carcinoma diagnosed 05/04/2019. Patient has undergone neoadjuvant chemotherapy, right breast lumpectomy 11/17/2019, and adjuvant radiation.    Pap Smear: Pap smear not completed today. Last Pap smear was 04/26/2019 at [facility] clinic and was normal. Per patient has no history of an abnormal Pap smear. Last Pap smear result is available in Epic.   Physical exam: Breasts Breasts symmetrical. No skin abnormalities left breast. Patient has history of radiation treatments right breast. Observed skin changes related to patients history of radiation right breast. Observed a scar right lower breast and right axilla due to history of lumpectomy and removal of lymph node. No nipple retraction bilateral breasts. No nipple discharge bilateral breasts. No lymphadenopathy. No lumps palpated bilateral breasts. No complaints of pain or tenderness on exam.       Pelvic/Bimanual Pap is not indicated today per BCCCP guidelines.   Smoking History: Patient has never smoked.   Patient Navigation: Patient education provided. Access to services provided for patient through [facility] program. Spanish interpreter Rudene Anda from [facility] provided.    Breast and Cervical Cancer Risk Assessment: Patient has a family history of her mother and a maternal aunt having breast cancer. Patient has personal history of breast cancer. Patient is positive for the BRCA1 mutation. Patient has history of right breast radiation treatment to the chest before age 89. Patient has no history of cervical dysplasia, immunocompromised, or DES exposure in-utero. Breast cancer risk completed. No breast cancer risk calculated due to patient is less than 42 years old.  Risk Assessment    Risk Scores      05/29/2020  04/26/2019   Last edited by: Royston Bake, CMA Azure Barrales, Heath Gold, RN   5-year risk:     Lifetime risk:            A: BCCCP exam without pap smear Complaint of occasional right breast pain.  P: Referred patient to the [facility] for a diagnostic mammogram per recommendation. Appointment scheduled Thursday, May 31, 2020 at 1250.   Loletta Parish, RN  05/29/2020 3:00 PM

## 2020-05-31 ENCOUNTER — Other Ambulatory Visit: Payer: Self-pay

## 2020-05-31 ENCOUNTER — Ambulatory Visit
Admission: RE | Admit: 2020-05-31 | Discharge: 2020-05-31 | Disposition: A | Discharge status: Home / Self Care | Payer: No Typology Code available for payment source | Source: Ambulatory Visit | Attending: Obstetrics and Gynecology | Admitting: Obstetrics and Gynecology

## 2020-05-31 DIAGNOSIS — Z853 Personal history of malignant neoplasm of breast: Secondary | ICD-10-CM

## 2020-05-31 HISTORY — DX: Malignant neoplasm of unspecified site of unspecified female breast: C50.919

## 2020-05-31 HISTORY — DX: Personal history of irradiation: Z92.3

## 2020-05-31 HISTORY — DX: Personal history of antineoplastic chemotherapy: Z92.21

## 2020-05-31 IMAGING — MG DIGITAL DIAGNOSTIC BILAT W/ TOMO W/ CAD
6 of 9 series · 6 of 25 positions shown · non-contrast
Comparison: Previous exam(s).

CLINICAL DATA: History of RIGHT breast cancer [J5] status post
breast conservation surgery. Per clinic note, positive for BRCA 1
mutation.

EXAM:
DIGITAL DIAGNOSTIC BILATERAL MAMMOGRAM WITH TOMO AND CAD

[R CC]
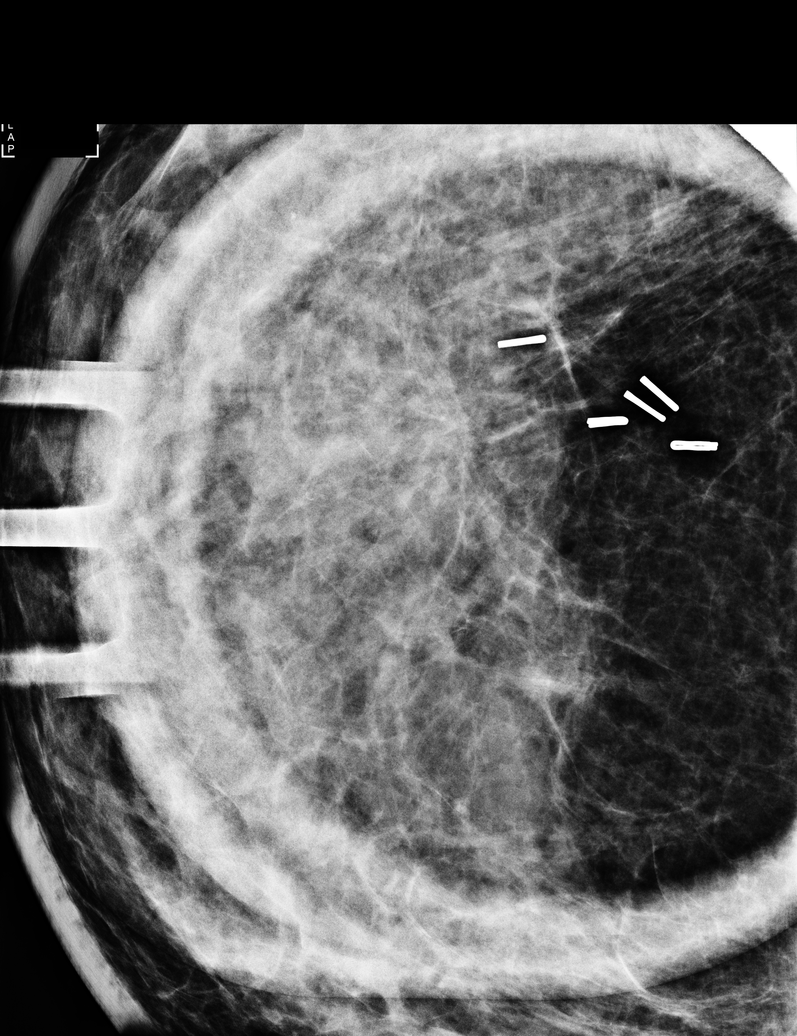

[R MLO synth-2D]
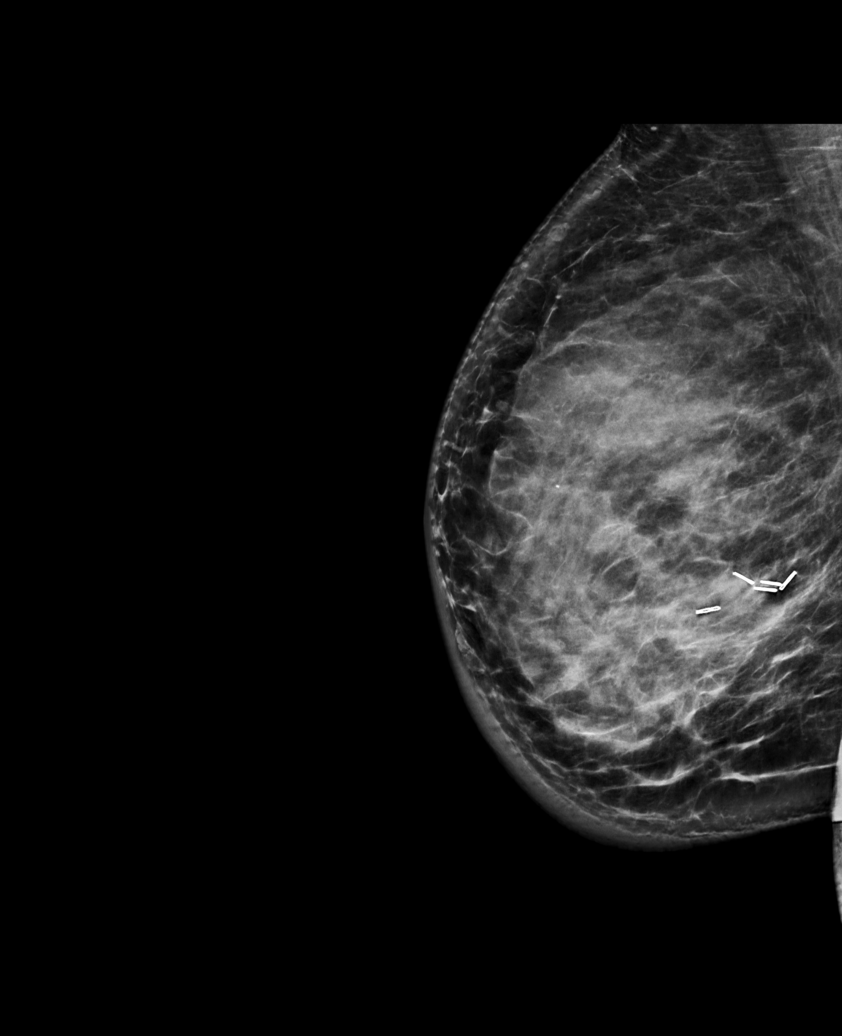

[R CC synth-2D]
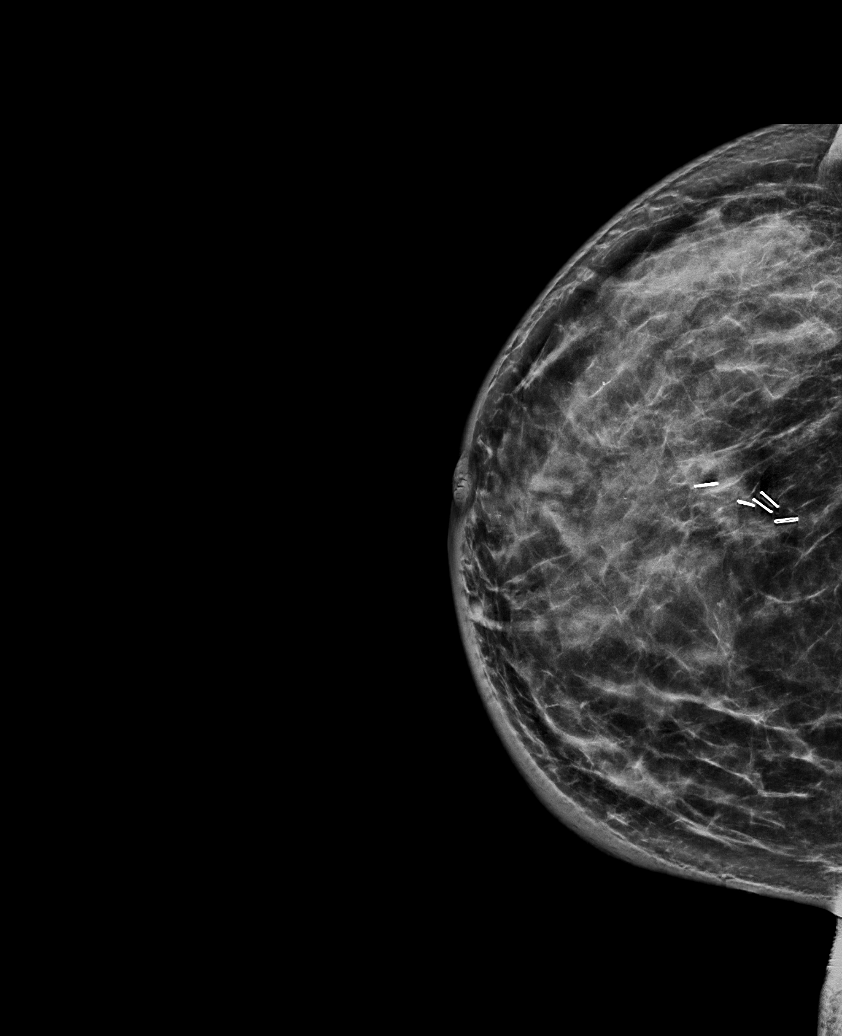

[L CC synth-2D]
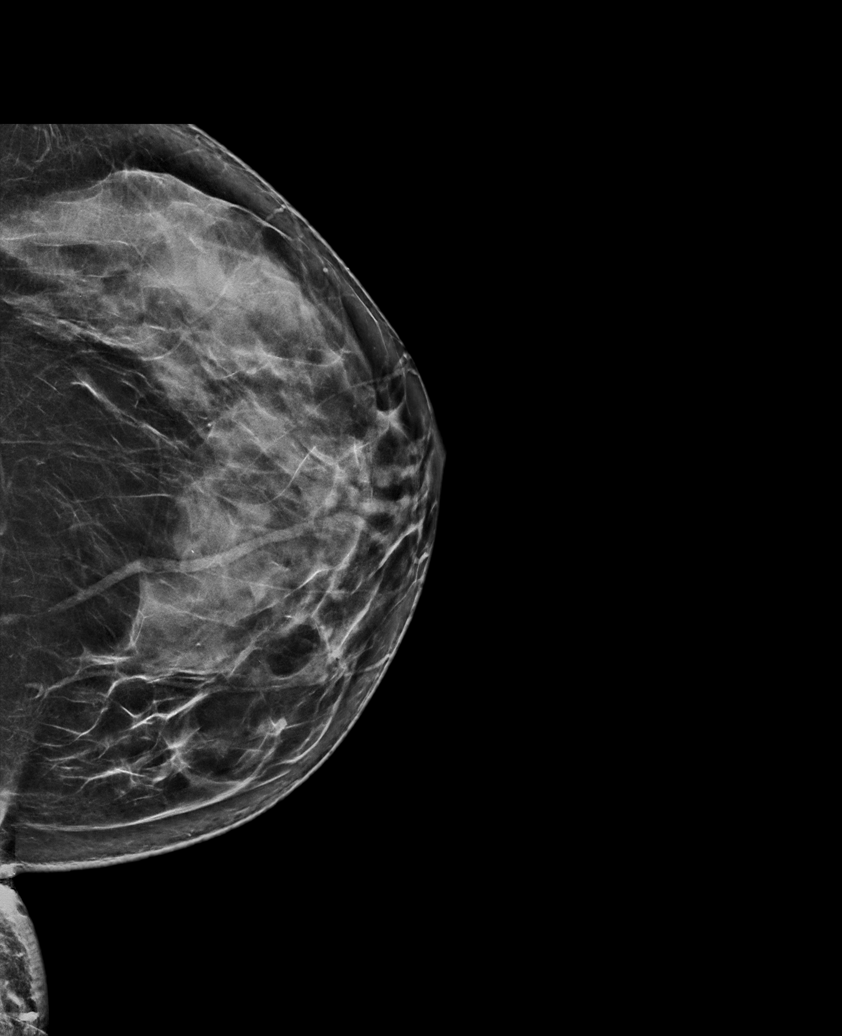

[L MLO synth-2D]
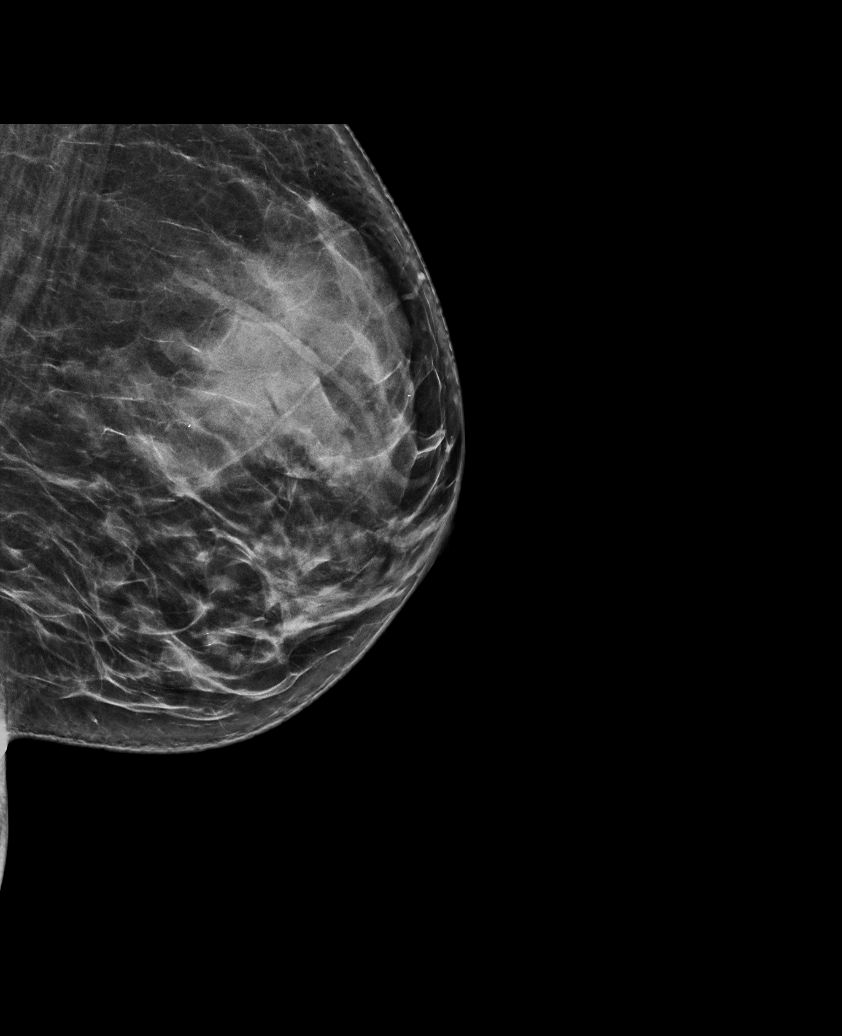

[R MLO tomo · tomo slice 47/94.0]
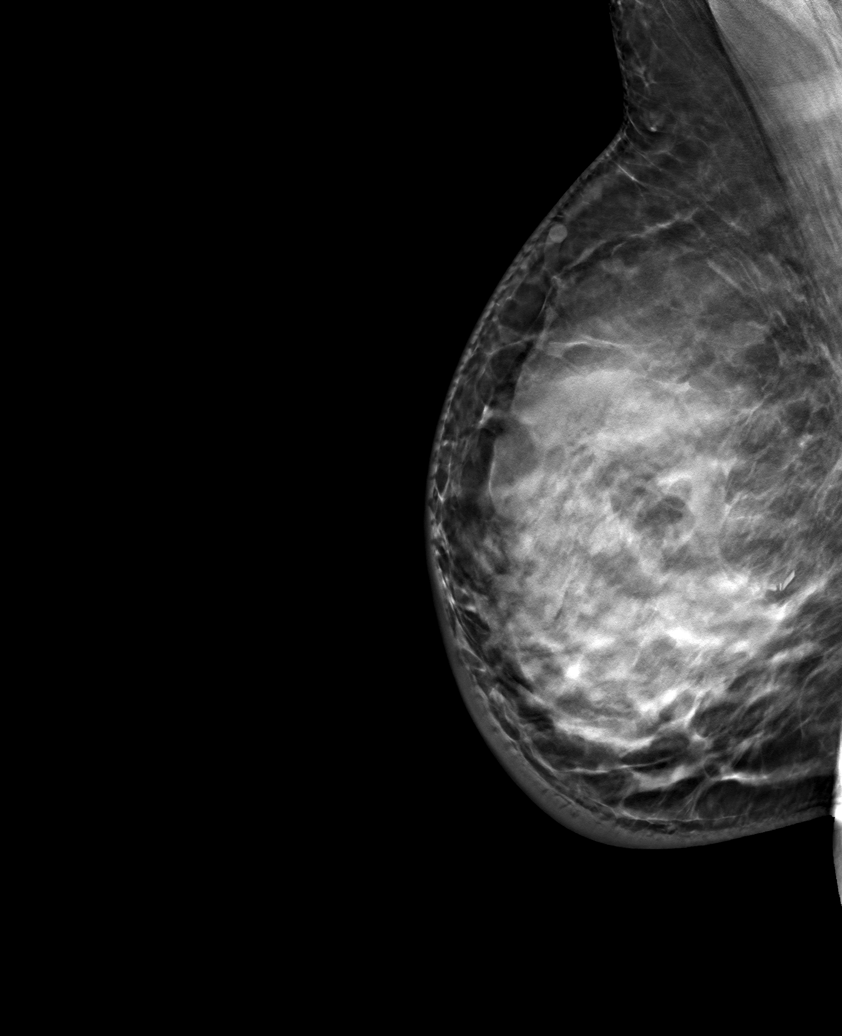

[6 of 25 positions shown; findings below may reference images not displayed]

ACR Breast Density Category c: The breast tissue is heterogeneously
dense, which may obscure small masses.
FINDINGS: There are expected postsurgical changes within the RIGHT breast.
There are no new dominant masses, suspicious calcifications or
secondary signs of malignancy within either breast.

Mammographic images were processed with CAD.
IMPRESSION: No evidence of malignancy within either breast. Expected
postsurgical changes within the RIGHT breast.

RECOMMENDATION:
1. Bilateral diagnostic mammogram in 1 year.
2. Given known BRCA 1 mutation, recommend annual screening breast
MRI.

I have discussed the findings and recommendations with the patient.
If applicable, a reminder letter will be sent to the patient
regarding the next appointment.

BI-RADS CATEGORY  2: Benign.

## 2020-06-06 ENCOUNTER — Inpatient Hospital Stay: Payer: Self-pay | Attending: Oncology

## 2020-06-06 ENCOUNTER — Other Ambulatory Visit: Payer: Self-pay

## 2020-06-06 VITALS — BP 116/71 | HR 85 | Resp 18

## 2020-06-06 DIAGNOSIS — Z95828 Presence of other vascular implants and grafts: Secondary | ICD-10-CM

## 2020-06-06 DIAGNOSIS — C50811 Malignant neoplasm of overlapping sites of right female breast: Secondary | ICD-10-CM | POA: Insufficient documentation

## 2020-06-06 DIAGNOSIS — Z5111 Encounter for antineoplastic chemotherapy: Secondary | ICD-10-CM | POA: Insufficient documentation

## 2020-06-06 DIAGNOSIS — Z171 Estrogen receptor negative status [ER-]: Secondary | ICD-10-CM | POA: Insufficient documentation

## 2020-06-06 DIAGNOSIS — Z01419 Encounter for gynecological examination (general) (routine) without abnormal findings: Secondary | ICD-10-CM

## 2020-06-06 MED ORDER — LEUPROLIDE ACETATE 3.75 MG IM KIT
3.7500 mg | PACK | Freq: Once | INTRAMUSCULAR | Status: AC
  Administered 2020-06-06: 14:00:00 3.75 mg via INTRAMUSCULAR
  Filled 2020-06-06: qty 3.75

## 2020-06-26 ENCOUNTER — Ambulatory Visit: Payer: Self-pay | Admitting: Surgery

## 2020-06-26 NOTE — H&P (Signed)
[facility] Guest Appointment: 06/26/2020 3:50 PM Location: [facility] Patient #: [id number] DOB: 11-Feb-1991 Unknown / Language: Undefined / Race: Undefined Female  History of Present Illness Marcello Moores A. Abdirahim Flavell MD; 06/26/2020 2:57 PM) Patient words: Patient returns for follow-up after neurologic chemotherapy and breast conserving surgery for locally advanced right breast cancer in BRCA 1 positive. She no longer require his report is here to discuss removal. She has no complaints. A Spanish interpreter was used to facilitate communication.  The patient is a 30 year old female.   Allergies Emeline Gins, [place]; 06/26/2020 2:13 PM) No Known Drug Allergies [05/06/2019]: Allergies Reconciled  Medication History Emeline Gins, [place]; 06/26/2020 2:16 PM) No Current Medications Medications Reconciled     Physical Exam (Anthony Roland A. Tryson Lumley MD; 06/26/2020 2:57 PM)  General Mental Status-Alert. General Appearance-Consistent with stated age. Hydration-Well hydrated. Voice-Normal.  Breast Note: Right breast scar noted. Right breast post radiation treatment changes noted. No masses. Left breast is normal.  Neurologic Neurologic evaluation reveals -alert and oriented x 3 with no impairment of recent or remote memory. Mental Status-Normal.  Lymphatic Head & Neck  General Head & Neck Lymphatics: Bilateral - Description - Normal. Axillary  General Axillary Region: Bilateral - Description - Normal. Tenderness - Non Tender.    Assessment & Plan (Tamiko Leopard A. Beverly Suriano MD; 06/26/2020 2:58 PM)  BRCA POSITIVE (Z15.01) Impression: Patient opted out of bilateral simple mastectomy. She is opted for urinary magnetic resonance imaging   RIGHT BREAST CANCER WITH T3 TUMOR, >5 CM IN GREATEST DIMENSION (C50.911) Impression: BRCA 1  Discussed treatment options with the help of a translator. I recommended initially given her urine were BRCA1 positivity bilateral  mastectomy and reconstruction and sentinel lymph node mapping. She does not wish to undergo bilateral mastectomies this point time and desires lumpectomy with sentinel lymph node mapping on the right. I discussed potential recurrence rates with her which will be extremely high given her young age and BRCA1 positivity she understands this. I discussed the need for yearly magnetic resonance imagings to follow her closely and to expect either a new cancer recurrence of her cancer with the next 10-15 years. She understands this but still does not wish to undergo bilateral mastectomy at this time understanding the risk. We'll proceed with right breast seed lumpectomy with right axillary sentinel lymph node mapping. Port will stay in for now until final pathology reviewed. Risk of lumpectomy include bleeding, infection, seroma, more surgery, use of seed/wire, wound care, cosmetic deformity and the need for other treatments, death , blood clots, death. Pt agrees to proceed. Risk of sentinel lymph node mapping include bleeding, infection, lymphedema, shoulder pain. stiffness, dye allergy. cosmetic deformity , blood clots, death, need for more surgery. Pt agrees to proceed.   PORT-A-CATH IN PLACE (Y69.485) Impression: Before removal discussed. Risk of bleeding, infection, embolus, catheter retention, catheter fragmentation. She's agree to proceed.  Current Plans I recommended surgery to remove the catheter. I explained the technique of removal with use of local anesthesia & possible need for more aggressive sedation/anesthesia for patient comfort.  Risks such as bleeding, infection, and other risks were discussed. Post-operative dressing/incision care was discussed. I noted a good likelihood this will help address the problem. We will work to minimize complications. Questions were answered. The patient expresses understanding & wishes to proceed with surgery.  Pt Education - [facility] Free Text  Education/Instructions: discussed with patient and provided information.

## 2020-07-04 ENCOUNTER — Other Ambulatory Visit: Payer: Self-pay

## 2020-07-04 ENCOUNTER — Inpatient Hospital Stay: Payer: Self-pay | Attending: Oncology

## 2020-07-04 VITALS — BP 126/74 | HR 83 | Resp 18

## 2020-07-04 DIAGNOSIS — Z171 Estrogen receptor negative status [ER-]: Secondary | ICD-10-CM | POA: Insufficient documentation

## 2020-07-04 DIAGNOSIS — Z95828 Presence of other vascular implants and grafts: Secondary | ICD-10-CM

## 2020-07-04 DIAGNOSIS — C50811 Malignant neoplasm of overlapping sites of right female breast: Secondary | ICD-10-CM | POA: Insufficient documentation

## 2020-07-04 DIAGNOSIS — Z923 Personal history of irradiation: Secondary | ICD-10-CM | POA: Insufficient documentation

## 2020-07-04 DIAGNOSIS — Z01419 Encounter for gynecological examination (general) (routine) without abnormal findings: Secondary | ICD-10-CM

## 2020-07-04 DIAGNOSIS — Z5111 Encounter for antineoplastic chemotherapy: Secondary | ICD-10-CM | POA: Insufficient documentation

## 2020-07-04 MED ORDER — LEUPROLIDE ACETATE 3.75 MG IM KIT
3.7500 mg | PACK | Freq: Once | INTRAMUSCULAR | Status: AC
  Administered 2020-07-04: 3.75 mg via INTRAMUSCULAR
  Filled 2020-07-04: qty 3.75

## 2020-07-04 NOTE — Patient Instructions (Signed)
Leuprolide injection What is this medicine? LEUPROLIDE (loo PROE lide) is a man-made hormone. It is used to treat the symptoms of prostate cancer. This medicine may also be used to treat children with early onset of puberty. It may be used for other hormonal conditions. This medicine may be used for other purposes; ask your health care provider or pharmacist if you have questions. COMMON BRAND NAME(S): Lupron What should I tell my health care provider before I take this medicine? They need to know if you have any of these conditions:  diabetes  heart disease or previous heart attack  high blood pressure  high cholesterol  pain or difficulty passing urine  spinal cord metastasis  stroke  tobacco smoker  an unusual or allergic reaction to leuprolide, benzyl alcohol, other medicines, foods, dyes, or preservatives  pregnant or trying to get pregnant  breast-feeding How should I use this medicine? This medicine is for injection under the skin or into a muscle. You will be taught how to prepare and give this medicine. Use exactly as directed. Take your medicine at regular intervals. Do not take your medicine more often than directed. It is important that you put your used needles and syringes in a special sharps container. Do not put them in a trash can. If you do not have a sharps container, call your pharmacist or healthcare provider to get one. A special MedGuide will be given to you by the pharmacist with each prescription and refill. Be sure to read this information carefully each time. Talk to your pediatrician regarding the use of this medicine in children. While this medicine may be prescribed for children as young as 8 years for selected conditions, precautions do apply. Overdosage: If you think you have taken too much of this medicine contact a poison control center or emergency room at once. NOTE: This medicine is only for you. Do not share this medicine with others. What if  I miss a dose? If you miss a dose, take it as soon as you can. If it is almost time for your next dose, take only that dose. Do not take double or extra doses. What may interact with this medicine? Do not take this medicine with any of the following medications:  chasteberry  cisapride  dronedarone  pimozide  thioridazine This medicine may also interact with the following medications:  herbal or dietary supplements, like black cohosh or DHEA  female hormones, like estrogens or progestins and birth control pills, patches, rings, or injections  female hormones, like testosterone  other medicines that prolong the QT interval (abnormal heart rhythm) This list may not describe all possible interactions. Give your health care provider a list of all the medicines, herbs, non-prescription drugs, or dietary supplements you use. Also tell them if you smoke, drink alcohol, or use illegal drugs. Some items may interact with your medicine. What should I watch for while using this medicine? Visit your doctor or health care professional for regular checks on your progress. During the first week, your symptoms may get worse, but then will improve as you continue your treatment. You may get hot flashes, increased bone pain, increased difficulty passing urine, or an aggravation of nerve symptoms. Discuss these effects with your doctor or health care professional, some of them may improve with continued use of this medicine. Female patients may experience a menstrual cycle or spotting during the first 2 months of therapy with this medicine. If this continues, contact your doctor or health care professional.   This medicine may increase blood sugar. Ask your healthcare provider if changes in diet or medicines are needed if you have diabetes. What side effects may I notice from receiving this medicine? Side effects that you should report to your doctor or health care professional as soon as possible:  allergic  reactions like skin rash, itching or hives, swelling of the face, lips, or tongue  breathing problems  chest pain  depression or memory disorders  pain in your legs or groin  pain at site where injected  severe headache  signs and symptoms of high blood sugar such as being more thirsty or hungry or having to urinate more than normal. You may also feel very tired or have blurry vision  swelling of the feet and legs  visual changes  vomiting Side effects that usually do not require medical attention (report to your doctor or health care professional if they continue or are bothersome):  breast swelling or tenderness  decrease in sex drive or performance  diarrhea  hot flashes  loss of appetite  muscle, joint, or bone pains  nausea  redness or irritation at site where injected  skin problems or acne This list may not describe all possible side effects. Call your doctor for medical advice about side effects. You may report side effects to FDA at 1-800-FDA-1088. Where should I keep my medicine? Keep out of the reach of children. Store below 25 degrees C (77 degrees F). Do not freeze. Protect from light. Do not use if it is not clear or if there are particles present. Throw away any unused medicine after the expiration date. NOTE: This sheet is a summary. It may not cover all possible information. If you have questions about this medicine, talk to your doctor, pharmacist, or health care provider.  2021 Elsevier/Gold Standard (2019-05-04 10:57:41)  

## 2020-07-09 ENCOUNTER — Other Ambulatory Visit (HOSPITAL_COMMUNITY): Payer: Self-pay | Admitting: Surgery

## 2020-07-09 DIAGNOSIS — C50911 Malignant neoplasm of unspecified site of right female breast: Secondary | ICD-10-CM

## 2020-07-17 ENCOUNTER — Other Ambulatory Visit: Payer: Self-pay | Admitting: Radiology

## 2020-07-18 ENCOUNTER — Other Ambulatory Visit: Payer: Self-pay

## 2020-07-18 ENCOUNTER — Ambulatory Visit (HOSPITAL_COMMUNITY)
Admission: RE | Admit: 2020-07-18 | Discharge: 2020-07-18 | Disposition: A | Discharge status: Home / Self Care | Payer: No Typology Code available for payment source | Source: Ambulatory Visit | Attending: Surgery | Admitting: Surgery

## 2020-07-18 ENCOUNTER — Ambulatory Visit (HOSPITAL_COMMUNITY)
Admission: RE | Admit: 2020-07-18 | Discharge: 2020-07-18 | Disposition: A | Discharge status: Home / Self Care | Payer: Self-pay | Source: Ambulatory Visit | Attending: Surgery | Admitting: Surgery

## 2020-07-18 ENCOUNTER — Encounter (HOSPITAL_COMMUNITY): Payer: Self-pay

## 2020-07-18 DIAGNOSIS — Z853 Personal history of malignant neoplasm of breast: Secondary | ICD-10-CM | POA: Insufficient documentation

## 2020-07-18 DIAGNOSIS — Z452 Encounter for adjustment and management of vascular access device: Secondary | ICD-10-CM | POA: Insufficient documentation

## 2020-07-18 DIAGNOSIS — C50911 Malignant neoplasm of unspecified site of right female breast: Secondary | ICD-10-CM

## 2020-07-18 HISTORY — PX: IR REMOVAL TUN ACCESS W/ PORT W/O FL MOD SED: IMG2290

## 2020-07-18 LAB — CBC WITH DIFFERENTIAL/PLATELET
Abs Immature Granulocytes: 0.02 10*3/uL (ref 0.00–0.07)
Basophils Absolute: 0 10*3/uL (ref 0.0–0.1)
Basophils Relative: 0 %
Eosinophils Absolute: 0.5 10*3/uL (ref 0.0–0.5)
Eosinophils Relative: 6 %
HCT: 35.2 % — ABNORMAL LOW (ref 36.0–46.0)
Hemoglobin: 12.4 g/dL (ref 12.0–15.0)
Immature Granulocytes: 0 %
Lymphocytes Relative: 29 %
Lymphs Abs: 2.1 10*3/uL (ref 0.7–4.0)
MCH: 32.2 pg (ref 26.0–34.0)
MCHC: 35.2 g/dL (ref 30.0–36.0)
MCV: 91.4 fL (ref 80.0–100.0)
Monocytes Absolute: 0.6 10*3/uL (ref 0.1–1.0)
Monocytes Relative: 8 %
Neutro Abs: 4 10*3/uL (ref 1.7–7.7)
Neutrophils Relative %: 57 %
Platelets: 226 10*3/uL (ref 150–400)
RBC: 3.85 MIL/uL — ABNORMAL LOW (ref 3.87–5.11)
RDW: 12.1 % (ref 11.5–15.5)
WBC: 7.2 10*3/uL (ref 4.0–10.5)
nRBC: 0 % (ref 0.0–0.2)

## 2020-07-18 LAB — BASIC METABOLIC PANEL
Anion gap: 9 (ref 5–15)
BUN: 13 mg/dL (ref 6–20)
CO2: 23 mmol/L (ref 22–32)
Calcium: 9.4 mg/dL (ref 8.9–10.3)
Chloride: 105 mmol/L (ref 98–111)
Creatinine, Ser: 0.61 mg/dL (ref 0.44–1.00)
GFR, Estimated: >60 mL/min (ref >60)
Glucose, Bld: 90 mg/dL (ref 70–99)
Potassium: 5.4 mmol/L — ABNORMAL HIGH (ref 3.5–5.1)
Sodium: 137 mmol/L (ref 135–145)

## 2020-07-18 LAB — PROTIME-INR
INR: 1 (ref 0.8–1.2)
Prothrombin Time: 13 seconds (ref 11.4–15.2)

## 2020-07-18 MED ORDER — FENTANYL CITRATE (PF) 100 MCG/2ML IJ SOLN
INTRAMUSCULAR | Status: AC
  Filled 2020-07-18: qty 2

## 2020-07-18 MED ORDER — SODIUM CHLORIDE 0.9 % IV SOLN: INTRAVENOUS | Status: DC

## 2020-07-18 MED ORDER — MIDAZOLAM HCL 2 MG/2ML IJ SOLN
INTRAMUSCULAR | Status: AC
  Filled 2020-07-18: qty 2

## 2020-07-18 MED ORDER — LIDOCAINE-EPINEPHRINE 1 %-1:100000 IJ SOLN
INTRAMUSCULAR | Status: AC
  Filled 2020-07-18: qty 1

## 2020-07-18 MED ORDER — LIDOCAINE HCL 1 % IJ SOLN
INTRAMUSCULAR | Status: AC
  Filled 2020-07-18: qty 20

## 2020-07-18 MED ORDER — MIDAZOLAM HCL 2 MG/2ML IJ SOLN
INTRAMUSCULAR | Status: AC | PRN
  Administered 2020-07-18 (×2): 1 mg via INTRAVENOUS

## 2020-07-18 MED ORDER — LIDOCAINE-EPINEPHRINE 1 %-1:100000 IJ SOLN
INTRAMUSCULAR | Status: AC | PRN
  Administered 2020-07-18: 10 mL via INTRADERMAL

## 2020-07-18 MED ORDER — CEFAZOLIN SODIUM-DEXTROSE 2-4 GM/100ML-% IV SOLN: 2.0000 g | INTRAVENOUS | Status: AC

## 2020-07-18 MED ORDER — CEFAZOLIN SODIUM-DEXTROSE 2-4 GM/100ML-% IV SOLN
INTRAVENOUS | Status: AC
  Administered 2020-07-18: 2 g via INTRAVENOUS
  Filled 2020-07-18: qty 100

## 2020-07-18 MED ORDER — FENTANYL CITRATE (PF) 100 MCG/2ML IJ SOLN
INTRAMUSCULAR | Status: AC | PRN
  Administered 2020-07-18 (×2): 50 ug via INTRAVENOUS

## 2020-07-18 NOTE — Procedures (Signed)
Interventional Radiology Procedure Note  Procedure: LT IJ POWER PORT REMOVAL    Complications: None  Estimated Blood Loss:  MIN  Findings: FULL REPORT IN PACS     Tamera Punt, MD

## 2020-07-18 NOTE — H&P (Signed)
Referring Physician(s): Cornett,Thomas/ Magrinat,G  Supervising Physician: Ruthann Cancer  Patient Status:  [facility]  Chief Complaint: "I'm getting my port out"   Subjective: Patient familiar to our service from Port-A-Cath placement on 05/24/2019.  She has a history of right breast cancer with prior lumpectomy and chemoradiation.  She has completed treatment and no longer needs her Port-A-Cath.  She presents today for Port-A-Cath removal.  She currently denies fever, headache, chest pain, dyspnea, cough, abdominal/back pain, nausea, vomiting or bleeding.  Past Medical History:  Diagnosis Date  . Breast cancer ([place])   . Cancer ([place]) 05/05/2019  . Personal history of chemotherapy   . Personal history of radiation therapy    Past Surgical History:  Procedure Laterality Date  . BREAST BIOPSY    . BREAST LUMPECTOMY    . BREAST LUMPECTOMY WITH RADIOACTIVE SEED AND SENTINEL LYMPH NODE BIOPSY Right 11/17/2019   Procedure: RIGHT BREAST LUMPECTOMY WITH RADIOACTIVE SEED;  Surgeon: Erroll Luna, MD;  Location: [facility];  Service: General;  Laterality: Right;  PEC BLOCK  . IR IMAGING GUIDED PORT INSERTION  05/24/2019  . SENTINEL NODE BIOPSY Right 11/17/2019   Procedure: Sentinel Node Biopsy;  Surgeon: Erroll Luna, MD;  Location: [facility];  Service: General;  Laterality: Right;      Allergies: Patient has no known allergies.  Medications: Prior to Admission medications   Medication Sig Start Date End Date Taking? Authorizing Provider  ibuprofen (ADVIL) 800 MG tablet Take 1 tablet (800 mg total) by mouth every 8 (eight) hours as needed. 11/17/19   Erroll Luna, MD     Vital Signs: BP (!) 116/91   Pulse 75   Temp 98.3 F (36.8 C)   Resp 20   SpO2 99%   Physical Exam awake, alert.  Chest clear to auscultation bilaterally.  Clean, intact left chest wall Port-A-Cath.  Heart with regular rate and rhythm.  Abdomen soft, positive bowel sounds, nontender.  No lower extremity  edema.  Imaging: No results found.  Labs:  CBC: Recent Labs    11/15/19 1156 11/24/19 1318 03/16/20 1011 07/18/20 1215  WBC 5.6 6.0 5.9 7.2  HGB 11.1* 10.9* 11.5* 12.4  HCT 33.2* 32.5* 33.3* 35.2*  PLT 191 210 159 226    COAGS: Recent Labs    07/18/20 1215  INR 1.0    BMP: Recent Labs    11/03/19 1300 11/15/19 1156 11/24/19 1318 03/16/20 1011  NA 141 140 137 138  K 3.6 3.7 3.8 4.4  CL 107 104 102 105  CO2 25 28 27 25   GLUCOSE 134* 112* 107* 90  BUN 11 7 9 12   CALCIUM 9.2 9.9 9.5 9.9  CREATININE 0.61 0.45 0.63 0.62  GFRNONAA >60 >60 >60 >60  GFRAA >60 >60 >60 >60    LIVER FUNCTION TESTS: Recent Labs    11/03/19 1300 11/15/19 1156 11/24/19 1318 03/16/20 1011  BILITOT 0.3 0.5 0.4 0.4  AST 110* 102* 83* 95*  ALT 265* 209* 196* 193*  ALKPHOS 102 87 110 131*  PROT 7.6 7.8 7.7 8.3*  ALBUMIN 3.9 3.8 3.8 4.0    Assessment and Plan: Patient familiar to our service from Port-A-Cath placement on 05/24/2019.  She has a history of right breast cancer with prior lumpectomy and chemoradiation.  She has completed treatment and no longer needs her Port-A-Cath.  She presents today for Port-A-Cath removal.  Details/risks of procedure, including but not limited to, internal bleeding, infection, injury to adjacent structures discussed with patient via interpreter  with her understanding and consent.  Electronically Signed: D. Rowe Robert, PA-C 07/18/2020, 1:08 PM   I spent a total of 20 minutes at the the patient's bedside AND on the patient's hospital floor or unit, greater than 50% of which was counseling/coordinating care for Port-A-Cath removal

## 2020-07-18 NOTE — Discharge Instructions (Signed)
Please call Interventional Radiology clinic [phone] with any questions or concerns.  You may remove your dressing and shower tomorrow.   Implanted Port Removal, Care After This sheet gives you information about how to care for yourself after your procedure. Your health care provider may also give you more specific instructions. If you have problems or questions, contact your health care provider. What can I expect after the procedure? After the procedure, it is common to have: Soreness or pain near your incision. Some swelling or bruising near your incision. Follow these instructions at home: Medicines Take over-the-counter and prescription medicines only as told by your health care provider. If you were prescribed an antibiotic medicine, take it as told by your health care provider. Do not stop taking the antibiotic even if you start to feel better. Bathing Do not take baths, swim, or use a hot tub until your health care provider approves. Ask your health care provider if you can take showers. You may only be allowed to take sponge baths. Incision care Follow instructions from your health care provider about how to take care of your incision. Make sure you: Wash your hands with soap and water before you change your bandage (dressing). If soap and water are not available, use hand sanitizer. Change your dressing as told by your health care provider. Keep your dressing dry. Leave stitches (sutures), skin glue, or adhesive strips in place. These skin closures may need to stay in place for 2 weeks or longer. If adhesive strip edges start to loosen and curl up, you may trim the loose edges. Do not remove adhesive strips completely unless your health care provider tells you to do that. Check your incision area every day for signs of infection. Check for: More redness, swelling, or pain. More fluid or blood. Warmth. Pus or a bad smell.    Driving Do not drive for 24 hours if you were  given a medicine to help you relax (sedative) during your procedure. If you did not receive a sedative, ask your health care provider when it is safe to drive.    Activity Return to your normal activities as told by your health care provider. Ask your health care provider what activities are safe for you. Do not lift anything that is heavier than 10 lb (4.5 kg), or the limit that you are told, until your health care provider says that it is safe. Do not do activities that involve lifting your arms over your head. General instructions Do not use any products that contain nicotine or tobacco, such as cigarettes and e-cigarettes. These can delay healing. If you need help quitting, ask your health care provider. Keep all follow-up visits as told by your health care provider. This is important. Contact a health care provider if: You have more redness, swelling, or pain around your incision. You have more fluid or blood coming from your incision. Your incision feels warm to the touch. You have pus or a bad smell coming from your incision. You have pain that is not relieved by your pain medicine. Get help right away if you have: A fever or chills. Chest pain. Difficulty breathing. Summary After the procedure, it is common to have pain, soreness, swelling, or bruising near your incision. If you were prescribed an antibiotic medicine, take it as told by your health care provider. Do not stop taking the antibiotic even if you start to feel better. Do not drive for 24 hours if you were given a sedative during   your procedure. Return to your normal activities as told by your health care provider. Ask your health care provider what activities are safe for you. This information is not intended to replace advice given to you by your health care provider. Make sure you discuss any questions you have with your health care provider. Document Revised: 07/16/2017 Document Reviewed: 07/16/2017 Elsevier Patient  Education  2021 Elsevier Inc.   Moderate Conscious Sedation, Adult, Care After This sheet gives you information about how to care for yourself after your procedure. Your health care provider may also give you more specific instructions. If you have problems or questions, contact your health care provider. What can I expect after the procedure? After the procedure, it is common to have: Sleepiness for several hours. Impaired judgment for several hours. Difficulty with balance. Vomiting if you eat too soon. Follow these instructions at home: For the time period you were told by your health care provider: Rest. Do not participate in activities where you could fall or become injured. Do not drive or use machinery. Do not drink alcohol. Do not take sleeping pills or medicines that cause drowsiness. Do not make important decisions or sign legal documents. Do not take care of children on your own.        Eating and drinking Follow the diet recommended by your health care provider. Drink enough fluid to keep your urine pale yellow. If you vomit: Drink water, juice, or soup when you can drink without vomiting. Make sure you have little or no nausea before eating solid foods.    General instructions Take over-the-counter and prescription medicines only as told by your health care provider. Have a responsible adult stay with you for the time you are told. It is important to have someone help care for you until you are awake and alert. Do not smoke. Keep all follow-up visits as told by your health care provider. This is important. Contact a health care provider if: You are still sleepy or having trouble with balance after 24 hours. You feel light-headed. You keep feeling nauseous or you keep vomiting. You develop a rash. You have a fever. You have redness or swelling around the IV site. Get help right away if: You have trouble breathing. You have new-onset confusion at  home. Summary After the procedure, it is common to feel sleepy, have impaired judgment, or feel nauseous if you eat too soon. Rest after you get home. Know the things you should not do after the procedure. Follow the diet recommended by your health care provider and drink enough fluid to keep your urine pale yellow. Get help right away if you have trouble breathing or new-onset confusion at home. This information is not intended to replace advice given to you by your health care provider. Make sure you discuss any questions you have with your health care provider. Document Revised: 09/30/2019 Document Reviewed: 04/28/2019 Elsevier Patient Education  2021 Elsevier Inc.   

## 2020-08-01 ENCOUNTER — Inpatient Hospital Stay (HOSPITAL_BASED_OUTPATIENT_CLINIC_OR_DEPARTMENT_OTHER): Payer: No Typology Code available for payment source | Admitting: Oncology

## 2020-08-01 ENCOUNTER — Inpatient Hospital Stay: Payer: No Typology Code available for payment source | Attending: Oncology

## 2020-08-01 ENCOUNTER — Other Ambulatory Visit: Payer: Self-pay

## 2020-08-01 VITALS — BP 130/80 | HR 78 | Temp 97.7°F | Resp 20 | Ht 61.0 in | Wt 167.5 lb

## 2020-08-01 DIAGNOSIS — Z95828 Presence of other vascular implants and grafts: Secondary | ICD-10-CM

## 2020-08-01 DIAGNOSIS — Z9221 Personal history of antineoplastic chemotherapy: Secondary | ICD-10-CM | POA: Insufficient documentation

## 2020-08-01 DIAGNOSIS — Z1501 Genetic susceptibility to malignant neoplasm of breast: Secondary | ICD-10-CM

## 2020-08-01 DIAGNOSIS — Z171 Estrogen receptor negative status [ER-]: Secondary | ICD-10-CM

## 2020-08-01 DIAGNOSIS — Z923 Personal history of irradiation: Secondary | ICD-10-CM | POA: Insufficient documentation

## 2020-08-01 DIAGNOSIS — C50811 Malignant neoplasm of overlapping sites of right female breast: Secondary | ICD-10-CM

## 2020-08-01 DIAGNOSIS — Z01419 Encounter for gynecological examination (general) (routine) without abnormal findings: Secondary | ICD-10-CM

## 2020-08-01 DIAGNOSIS — Z791 Long term (current) use of non-steroidal anti-inflammatories (NSAID): Secondary | ICD-10-CM | POA: Insufficient documentation

## 2020-08-01 DIAGNOSIS — Z79818 Long term (current) use of other agents affecting estrogen receptors and estrogen levels: Secondary | ICD-10-CM | POA: Insufficient documentation

## 2020-08-01 DIAGNOSIS — Z1502 Genetic susceptibility to malignant neoplasm of ovary: Secondary | ICD-10-CM

## 2020-08-01 MED ORDER — LEUPROLIDE ACETATE 3.75 MG IM KIT
3.7500 mg | PACK | Freq: Once | INTRAMUSCULAR | Status: AC
  Administered 2020-08-01: 3.75 mg via INTRAMUSCULAR
  Filled 2020-08-01: qty 3.75

## 2020-08-01 NOTE — Patient Instructions (Signed)
Leuprolide injection What is this medicine? LEUPROLIDE (loo PROE lide) is a man-made hormone. It is used to treat the symptoms of prostate cancer. This medicine may also be used to treat children with early onset of puberty. It may be used for other hormonal conditions. This medicine may be used for other purposes; ask your health care provider or pharmacist if you have questions. COMMON BRAND NAME(S): Lupron What should I tell my health care provider before I take this medicine? They need to know if you have any of these conditions:  diabetes  heart disease or previous heart attack  high blood pressure  high cholesterol  pain or difficulty passing urine  spinal cord metastasis  stroke  tobacco smoker  an unusual or allergic reaction to leuprolide, benzyl alcohol, other medicines, foods, dyes, or preservatives  pregnant or trying to get pregnant  breast-feeding How should I use this medicine? This medicine is for injection under the skin or into a muscle. You will be taught how to prepare and give this medicine. Use exactly as directed. Take your medicine at regular intervals. Do not take your medicine more often than directed. It is important that you put your used needles and syringes in a special sharps container. Do not put them in a trash can. If you do not have a sharps container, call your pharmacist or healthcare provider to get one. A special MedGuide will be given to you by the pharmacist with each prescription and refill. Be sure to read this information carefully each time. Talk to your pediatrician regarding the use of this medicine in children. While this medicine may be prescribed for children as young as 8 years for selected conditions, precautions do apply. Overdosage: If you think you have taken too much of this medicine contact a poison control center or emergency room at once. NOTE: This medicine is only for you. Do not share this medicine with others. What if  I miss a dose? If you miss a dose, take it as soon as you can. If it is almost time for your next dose, take only that dose. Do not take double or extra doses. What may interact with this medicine? Do not take this medicine with any of the following medications:  chasteberry  cisapride  dronedarone  pimozide  thioridazine This medicine may also interact with the following medications:  herbal or dietary supplements, like black cohosh or DHEA  female hormones, like estrogens or progestins and birth control pills, patches, rings, or injections  female hormones, like testosterone  other medicines that prolong the QT interval (abnormal heart rhythm) This list may not describe all possible interactions. Give your health care provider a list of all the medicines, herbs, non-prescription drugs, or dietary supplements you use. Also tell them if you smoke, drink alcohol, or use illegal drugs. Some items may interact with your medicine. What should I watch for while using this medicine? Visit your doctor or health care professional for regular checks on your progress. During the first week, your symptoms may get worse, but then will improve as you continue your treatment. You may get hot flashes, increased bone pain, increased difficulty passing urine, or an aggravation of nerve symptoms. Discuss these effects with your doctor or health care professional, some of them may improve with continued use of this medicine. Female patients may experience a menstrual cycle or spotting during the first 2 months of therapy with this medicine. If this continues, contact your doctor or health care professional.   This medicine may increase blood sugar. Ask your healthcare provider if changes in diet or medicines are needed if you have diabetes. What side effects may I notice from receiving this medicine? Side effects that you should report to your doctor or health care professional as soon as possible:  allergic  reactions like skin rash, itching or hives, swelling of the face, lips, or tongue  breathing problems  chest pain  depression or memory disorders  pain in your legs or groin  pain at site where injected  severe headache  signs and symptoms of high blood sugar such as being more thirsty or hungry or having to urinate more than normal. You may also feel very tired or have blurry vision  swelling of the feet and legs  visual changes  vomiting Side effects that usually do not require medical attention (report to your doctor or health care professional if they continue or are bothersome):  breast swelling or tenderness  decrease in sex drive or performance  diarrhea  hot flashes  loss of appetite  muscle, joint, or bone pains  nausea  redness or irritation at site where injected  skin problems or acne This list may not describe all possible side effects. Call your doctor for medical advice about side effects. You may report side effects to FDA at 1-800-FDA-1088. Where should I keep my medicine? Keep out of the reach of children. Store below 25 degrees C (77 degrees F). Do not freeze. Protect from light. Do not use if it is not clear or if there are particles present. Throw away any unused medicine after the expiration date. NOTE: This sheet is a summary. It may not cover all possible information. If you have questions about this medicine, talk to your doctor, pharmacist, or health care provider.  2021 Elsevier/Gold Standard (2019-05-04 10:57:41)  

## 2020-08-01 NOTE — Progress Notes (Signed)
[place]  [phone] [phone]     ID: Brianna Owens DOB: [birth year]  MR#: [id number]  ONG#:[id number]  Patient Care Team: Patient, [name] as PCP - General (General Practice) Chevelle Coulson, [name], MD as Consulting Physician (Oncology) Erroll Luna, MD as Consulting Physician (General Surgery) [name], RN as Oncology Nurse Navigator [name], RN as Oncology Nurse Navigator Eppie Gibson, MD as Attending Physician (Radiation Oncology) Chauncey Cruel, MD OTHER MD:  CHIEF COMPLAINT: Triple negative breast cancer, BRCA1 positive  CURRENT TREATMENT: continuing Luprolide monthly   INTERVAL HISTORY: Brianna Owens returns today for follow up of Brianna Owens triple negative breast cancer.  Brianna Owens is accompanied by a [place] interpreter.  Brianna Owens most recent Lupron shot was given on 07/04/2020.  Brianna Owens has a shot due today.  Brianna Owens does not have hot flashes or other significant symptoms of menopause.  Brianna Owens tolerates the shots themselves without any difficulty.  Since Brianna Owens last visit, Brianna Owens underwent bilateral diagnostic mammography with tomography at [facility] on 05/31/2020 showing: breast density category C; no evidence of malignancy in either breast.  Brianna Owens also had Brianna Owens port removed on 07/18/2020.  Brianna Owens tolerated that without complications  Of note, Brianna Owens has not yet seen GYN oncology.  Brianna Owens is thinking that at some point in the future Brianna Owens might want to have another child.   REVIEW OF SYSTEMS: Madysin occasionally takes walks in the park.  Brianna Owens is working irregularly.  Brianna Owens says Brianna Owens does not like to get up early when it is so cold outside.  At home is just Brianna Owens and Brianna Owens 28-year-old daughter who is not yet in preschool.  A detailed review of systems was otherwise stable   COVID 19 VACCINATION STATUS: Status post Pfizer x2 most recently August 2021    HISTORY OF CURRENT ILLNESS: From the original intake note:  [place]  presented to the [facility] with a 3 month history of a right breast lump that became painful in early 04/2019. Physical exam performed at that time showed a palpable, tender 13 cm lump within the right center breast under the nipple area. Brianna Owens underwent right breast ultrasonography at [facility] on 04/27/2019 showing: 6.1 cm palpable mass centered in the 5 o'clock position of the right breast; single right inferomedial axillary lymph node with focal cortical thickening inferiorly.  Accordingly on 05/04/2019 Brianna Owens proceeded to biopsy of the right breast area in question. The pathology from this procedure ([id number]) showed: invasive ductal carcinoma, grade 2-3. Prognostic indicators significant for: estrogen receptor, 0% negative and progesterone receptor, 0% negative. Proliferation marker Ki67 at 40%.  I do not find Brianna Owens-2 receptor documentation  The right axillary lymph node biopsied at that time showed reactive germinal centers.  Brianna Owens underwent bilateral diagnostic mammography with tomography at [facility] on 05/05/2019 showing: breast density category D; biopsy-proven right breast malignancy measures 6.1 cm; no additional masses or calcifications seen in the right breast; no evidence of malignancy in the left breast.  The patient's subsequent history is as detailed below.   PAST MEDICAL HISTORY: Past Medical History:  Diagnosis Date  . Breast cancer ([place])   . Cancer ([place]) 05/05/2019  . Personal history of chemotherapy   . Personal history of radiation therapy     PAST SURGICAL HISTORY: Past Surgical History:  Procedure Laterality Date  . BREAST BIOPSY    . BREAST LUMPECTOMY    . BREAST LUMPECTOMY WITH RADIOACTIVE SEED AND SENTINEL  LYMPH NODE BIOPSY Right 11/17/2019   Procedure: RIGHT BREAST LUMPECTOMY WITH RADIOACTIVE SEED;  Surgeon: Erroll Luna, MD;  Location: [facility];  Service: General;  Laterality: Right;  PEC BLOCK  . IR IMAGING GUIDED PORT  INSERTION  05/24/2019  . IR REMOVAL TUN ACCESS W/ PORT W/O FL MOD SED  07/18/2020  . SENTINEL NODE BIOPSY Right 11/17/2019   Procedure: Sentinel Node Biopsy;  Surgeon: Erroll Luna, MD;  Location: [facility];  Service: General;  Laterality: Right;    FAMILY HISTORY: Family History  Problem Relation Age of Onset  . Breast cancer Mother   . Breast cancer Maternal Aunt   Patient's father is 91 and Brianna Owens mother 66 as of November 2020.  The patient's mother was diagnosed with breast cancer in Brianna Owens early 55s.  Brianna Owens lives in [place]. The patient's mother's sister was also diagnosed with breast cancer in Brianna Owens early 31s.  The patient herself has 1 sister, no brothers.  Brianna Owens is not aware of any ovarian cancer cases in the family but tells me "all the women in my family had their ovaries removed".   GYNECOLOGIC HISTORY:  No LMP recorded. (Menstrual status: Irregular Periods). Menarche: 30 years old Age at first live birth: 30 years old [place] P 2 LMP regular Contraceptive HRT n/a  Hysterectomy? no BSO? no   SOCIAL HISTORY: (updated February 2022)  Erleen normally works at [employer], 12-hour days, Saturday and Sunday every other weekend.  Brianna Owens is originally from [place].  Brianna Owens is divorced.  Brianna Owens children are [name], 34 years old, living in [place] with his father, and [name], 56 years old, who lives with the patient.     ADVANCED DIRECTIVES: Not in place   HEALTH MAINTENANCE: Social History   Tobacco Use  . Smoking status: Never Smoker  . Smokeless tobacco: Never Used  Vaping Use  . Vaping Use: Never used  Substance Use Topics  . Alcohol use: Not Currently  . Drug use: Not Currently     Colonoscopy: n/a  PAP: 04/26/2019, negative  Bone density: n/a   No Known Allergies  Current Outpatient Medications  Medication Sig Dispense Refill  . ibuprofen (ADVIL) 800 MG tablet Take 1 tablet (800 mg total) by mouth every 8 (eight) hours as needed. 30 tablet 0   No current facility-administered medications  for this visit.    OBJECTIVE:  Spanish speaker who appears stated age  30:   08/01/20 1510  BP: 130/80  Pulse: 78  Resp: 20  Temp: 97.7 F (36.5 C)  SpO2: 100%     Body mass index is 31.65 kg/m.   Wt Readings from Last 3 Encounters:  08/01/20 167 lb 8 oz (76 kg)  05/29/20 160 lb 14.4 oz (73 kg)  05/09/20 164 lb 4.8 oz (74.5 kg)      ECOG FS:1 - Symptomatic but completely ambulatory  Sclerae unicteric, EOMs intact Wearing a mask No cervical or supraclavicular adenopathy Lungs no rales or rhonchi Heart regular rate and rhythm Abd soft, nontender, positive bowel sounds MSK no focal spinal tenderness, no upper extremity lymphedema Neuro: nonfocal, well oriented, appropriate affect Breasts: Status post right lumpectomy and radiation, with no evidence of residual or recurrent disease.  Left breast is benign.  Both axillae are benign.  LAB RESULTS:  CMP     Component Value Date/Time   NA 137 07/18/2020 1215   K 5.4 (H) 07/18/2020 1215   CL 105 07/18/2020 1215   CO2 23 07/18/2020 1215   GLUCOSE  90 07/18/2020 1215   BUN 13 07/18/2020 1215   CREATININE 0.61 07/18/2020 1215   CREATININE 0.45 07/21/2019 1025   CALCIUM 9.4 07/18/2020 1215   PROT 8.3 (H) 03/16/2020 1011   ALBUMIN 4.0 03/16/2020 1011   AST 95 (H) 03/16/2020 1011   AST 29 07/21/2019 1025   ALT 193 (H) 03/16/2020 1011   ALT 58 (H) 07/21/2019 1025   ALKPHOS 131 (H) 03/16/2020 1011   BILITOT 0.4 03/16/2020 1011   BILITOT 0.3 07/21/2019 1025   GFRNONAA >60 07/18/2020 1215   GFRNONAA >60 07/21/2019 1025   GFRAA >60 03/16/2020 1011   GFRAA >60 07/21/2019 1025    No results found for: TOTALPROTELP, ALBUMINELP, A1GS, A2GS, BETS, BETA2SER, GAMS, MSPIKE, SPEI  No results found for: KPAFRELGTCHN, LAMBDASER, KAPLAMBRATIO  Lab Results  Component Value Date   WBC 7.2 07/18/2020   NEUTROABS 4.0 07/18/2020   HGB 12.4 07/18/2020   HCT 35.2 (L) 07/18/2020   MCV 91.4 07/18/2020   PLT 226 07/18/2020    No  results found for: LABCA2  No components found for: [id number]  No results for input(s): INR in the last 168 hours.  No results found for: LABCA2  No results found for: EUM353  No results found for: IRW431  No results found for: VQM086  No results found for: CA2729  No components found for: HGQUANT  No results found for: CEA1 / No results found for: CEA1   No results found for: AFPTUMOR  No results found for: CHROMOGRNA  No results found for: HGBA, HGBA2QUANT, HGBFQUANT, HGBSQUAN (Hemoglobinopathy evaluation)   No results found for: LDH  No results found for: IRON, TIBC, IRONPCTSAT (Iron and TIBC)  No results found for: FERRITIN  Urinalysis No results found for: COLORURINE, APPEARANCEUR, LABSPEC, PHURINE, GLUCOSEU, HGBUR, BILIRUBINUR, KETONESUR, PROTEINUR, UROBILINOGEN, NITRITE, LEUKOCYTESUR   STUDIES: IR REMOVAL TUN ACCESS W/ PORT W/O FL MOD SED  Result Date: 07/18/2020 INDICATION: Breast cancer, completed therapy EXAM: REMOVAL LEFT IJ VEIN PORT-A-CATH MEDICATIONS: ANCEF 2 G; The antibiotic was administered within an appropriate time interval prior to skin puncture. ANESTHESIA/SEDATION: Moderate (conscious) sedation was employed during this procedure. A total of Versed 2.0 mg and Fentanyl 100 mcg was administered intravenously. Moderate Sedation Time: 17 minutes. The patient's level of consciousness and vital signs were monitored continuously by radiology nursing throughout the procedure under my direct supervision. FLUOROSCOPY TIME:  Fluoroscopy Time: NONE. COMPLICATIONS: None immediate. PROCEDURE: Informed written consent was obtained from the [place] INTERPRETER after a thorough discussion of the procedural risks, benefits and alternatives. All questions were addressed. Maximal Sterile Barrier Technique was utilized including caps, mask, sterile gowns, sterile gloves, sterile drape, hand hygiene and skin antiseptic. A timeout was performed prior to the  initiation of the procedure. The right chest was prepped and draped in a sterile fashion. Lidocaine was utilized for local anesthesia. An incision was made over the previously healed surgical incision. Utilizing blunt dissection, the port catheter and reservoir were removed from the underlying subcutaneous tissue in their entirety. Securing sutures were also removed. The pocket was irrigated with a copious amount of sterile normal saline. The pocket was closed with interrupted 4-0 Vicryl stitches. The subcutaneous tissue was closed with 4 0 Vicryl interrupted subcutaneous stitches. A 4-0 Vicryl running subcuticular stitch was utilized to approximate the skin. Dermabond was applied. IMPRESSION: Successful right IJ vein Port-A-Cath explant. Electronically Signed   By: Jerilynn Mages.  Shick M.D.   On: 07/18/2020 14:36     ELIGIBLE FOR AVAILABLE  RESEARCH PROTOCOL:no  ASSESSMENT: 30 y.o. BRCA1 positive [name] woman status post right breast overlapping sites biopsy 05/04/2019 for a clinical T3 N0, stage IIIB invasive ductal carcinoma, grade 2, triple negative, with an MIB-1 of 40%.  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  The Common Hereditary Gene Panel offered by [facility] includes sequencing and/or deletion duplication testing of the following 48 genes: APC, ATM, AXIN2, BARD1, BMPR1A, BRCA1, BRCA2, BRIP1, CDH1, CDK4, CDKN2A (p14ARF), CDKN2A (p16INK4a), CHEK2, CTNNA1, DICER1, EPCAM (Deletion/duplication testing only), GREM1 (promoter region deletion/duplication testing only), KIT, MEN1, MLH1, MSH2, MSH3, MSH6, MUTYH, NBN, NF1, NHTL1, PALB2, PDGFRA, PMS2, POLD1, POLE, PTEN, RAD50, RAD51C, RAD51D, RNF43, SDHB, SDHC, SDHD, SMAD4, SMARCA4. STK11, TP53, TSC1, TSC2, and VHL.  The following genes were evaluated for sequence changes  only: SDHA and HOXB13 c.251G>A variant only. The report date is 05/24/2019.  (b) patient recommended bilateral mastectomies, due to potential barriers/risks with age and intensified screening  (2) neoadjuvant chemotherapy consisting of doxorubicin and cyclophosphamide in dose dense fashion x4 started 05/26/2019, completed 07/07/2019, followed by paclitaxel and carboplatin weekly x12 started 07/21/2019  (a) breast MRI 09/26/2019 shows a complete radiologic response  (3) status post right lumpectomy 11/17/2019 showing a complete pathologic response [ypT0 ypN0]  (a) a total of 5 axillary lymph nodes were removed  (4) adjuvant radiation   Radiation Treatment Dates: 01/02/2020 through 02/15/2020 Site Technique Total Dose (Gy) Dose per Fx (Gy) Completed Fx Beam Energies  Breast, Right: Breast_Rt 3D 50.4/50.4 1.8 28/28 6X, 10X  Breast, Right: Breast_Rt_Bst 3D 10/10 2 5/5 6X  Sclav-RT: SCV_Rt 3D 50.4/50.4 1.8 28/28 6X, 10X    (5) considering bilateral salpingo-oophorectomy for ovarian cancer prevention  (a) receiving leuprolide/Lupron every 28 days   PLAN: Shantara is now half a year out from definitive surgery for Brianna Owens breast cancer with no evidence of disease recurrence.  This is very favorable.  Even though Brianna Owens has a very good prognosis regarding the cancer Brianna Owens had removed, Brianna Owens remains at high risk for developing another breast cancer.  We need to continue intensified screening and I am setting Brianna Owens up for a breast MRI in June of this year.  At some point Brianna Owens will benefit from bilateral salpingo-oophorectomy, however Brianna Owens is only 27 at present and Brianna Owens says Brianna Owens may want to have another child at some point in the future.  Accordingly bilateral salpingo-oophorectomy is being postponed.  We are continuing the monthly Zoladex, which provides Brianna Owens with contraception and may decrease the risk of future breast cancer to some extent.  Brianna Owens tells me Brianna Owens mother who has stage IV disease and is being treated in  [place] wanted to know the details of Brianna Owens cancer so I gave [place] a copy of Brianna Owens summary above and translated for Brianna Owens  Brianna Owens will see me again in August.  Brianna Owens knows to call for any other issue that may develop before the next visit.  Total encounter time 25 minutes.[name] Jews C. Onyinyechi Huante, MD 08/01/20 3:40 PM Medical Oncology and Hematology [facility] [place], [place] [address] Tel. [phone]    Fax. [phone]   I, Wilburn Mylar, am acting as scribe for Dr. [name]. Dequavion Follette.  I, Lurline Del MD, have reviewed the above documentation for accuracy and completeness, and I agree with the above.   *Total Encounter Time as  defined by the Centers for Medicare and Medicaid Services includes, in addition to the face-to-face time of a patient visit (documented in the note above) non-face-to-face time: obtaining and reviewing outside history, ordering and reviewing medications, tests or procedures, care coordination (communications with other health care professionals or caregivers) and documentation in the medical record.

## 2020-08-02 ENCOUNTER — Telehealth: Payer: Self-pay | Admitting: Oncology

## 2020-08-02 NOTE — Telephone Encounter (Signed)
Scheduled appts per 2/16 los. Pt to get updated appt calendar at next visit per appt notes.  

## 2020-08-10 NOTE — Progress Notes (Signed)
..  The following medication: Lupron was re-approved for Replacement from [facility]. Approved 08/09/2020 to 08/08/2021. No lapse in coverage, continued care.  Original Expiration of Assistance: 08/23/2020. Reason: Self Pay ID: [id number] Next DOS: 08/29/2020.

## 2020-08-27 ENCOUNTER — Ambulatory Visit
Admission: RE | Admit: 2020-08-27 | Discharge: 2020-08-27 | Disposition: A | Discharge status: Home / Self Care | Payer: Self-pay | Source: Ambulatory Visit | Attending: Oncology | Admitting: Oncology

## 2020-08-27 ENCOUNTER — Other Ambulatory Visit: Payer: Self-pay

## 2020-08-27 DIAGNOSIS — C50811 Malignant neoplasm of overlapping sites of right female breast: Secondary | ICD-10-CM

## 2020-08-27 DIAGNOSIS — Z1501 Genetic susceptibility to malignant neoplasm of breast: Secondary | ICD-10-CM

## 2020-08-27 DIAGNOSIS — Z171 Estrogen receptor negative status [ER-]: Secondary | ICD-10-CM

## 2020-08-27 IMAGING — MR MR BREAST WO/W CM  BILAT
5 series · 30 of 48 positions shown · IV contrast (7ml Gadavist)
Comparison: [DATE] mammogram.  [DATE] and prior breast MRs

CLINICAL DATA: 29-year-old female with BRCA 1 gene mutation and
high lifetime risk for developing recurrent breast cancer. History
of RIGHT breast cancer, treatment and lumpectomy in [VF].

LABS:  None performed today
EXAM:
BILATERAL BREAST MRI WITH AND WITHOUT CONTRAST
TECHNIQUE: Multiplanar, multisequence MR images of both breasts were obtained
prior to and following the intravenous administration of 7 ml of
Gadavist

[Series 2: t2_tirm_tra ipat (a-p) · axial · 3.0mm · 0.78mm/px · z∈[-60,+102]mm · 5 of 55 slices shown]
[im 1/55]
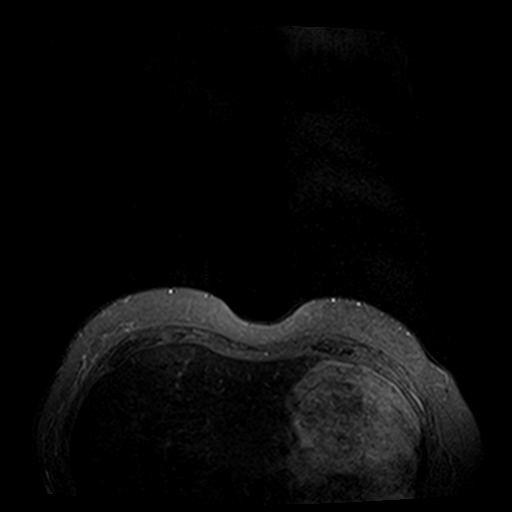
[im 14/55]
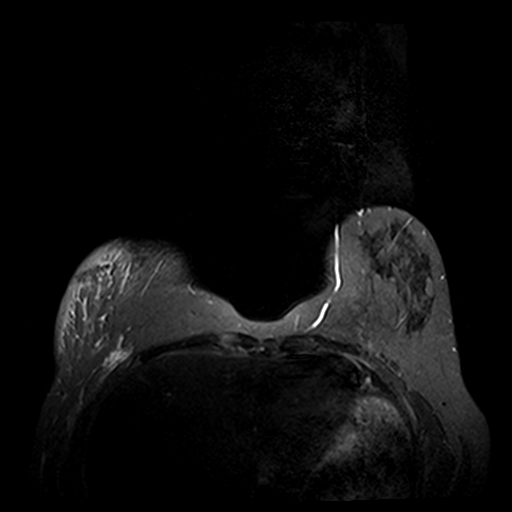
[im 28/55]
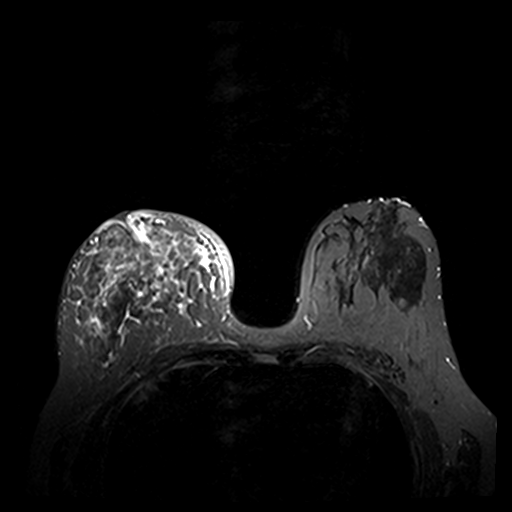
[im 41/55]
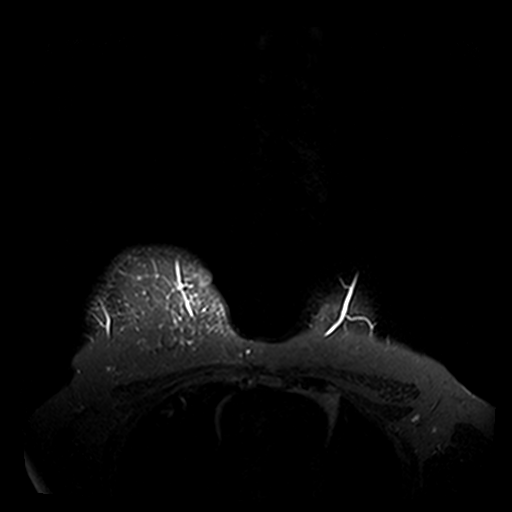
[im 55/55]
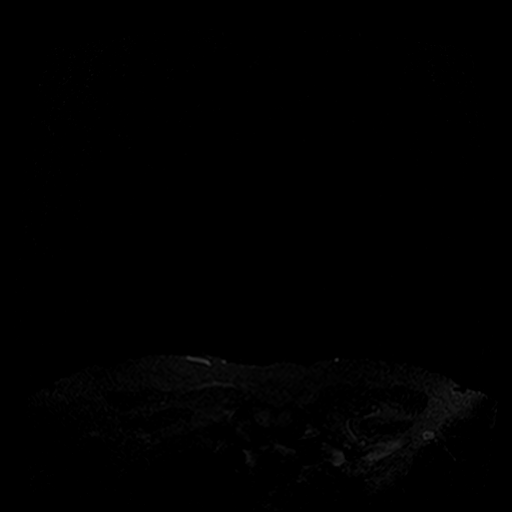

[Series 3: fl3d pre-cm · axial · non-contrast · 1.2mm · 1.04mm/px · z∈[-65,+106]mm · 8 of 144 slices shown]
[im 1/144]
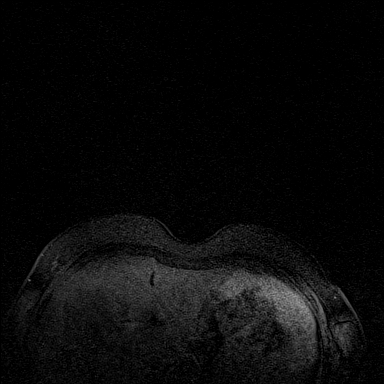
[im 23/144]
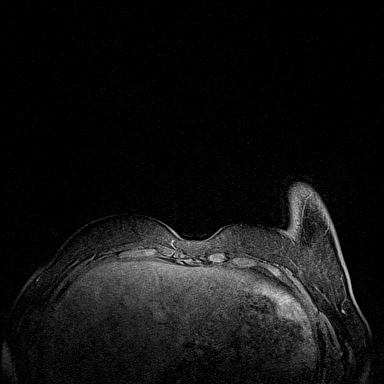
[im 45/144]
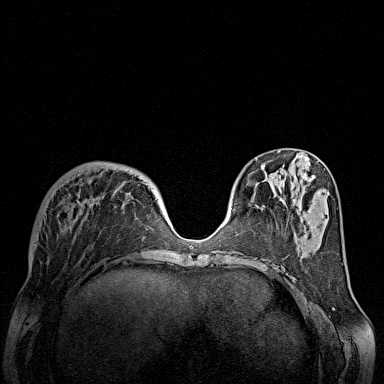
[im 67/144]
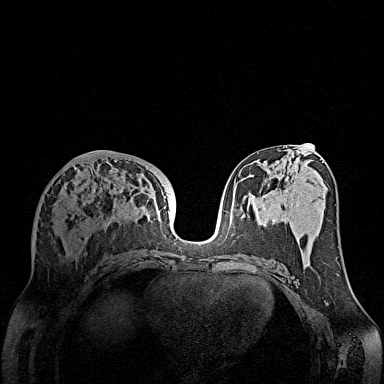
[im 78/144]
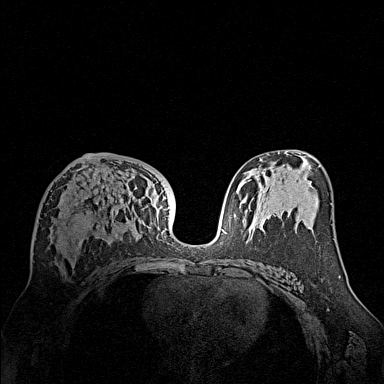
[im 100/144]
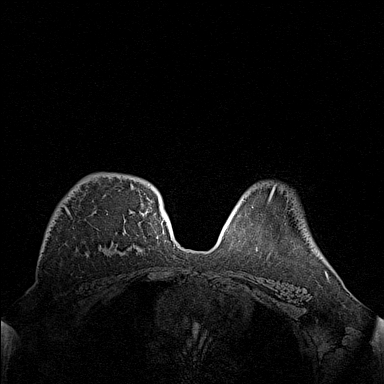
[im 122/144]
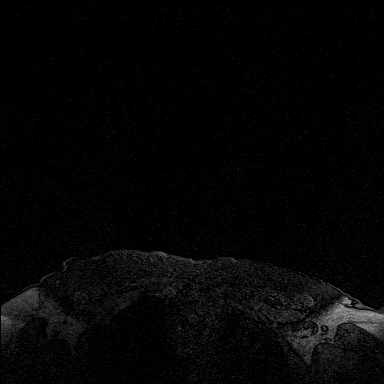
[im 144/144]
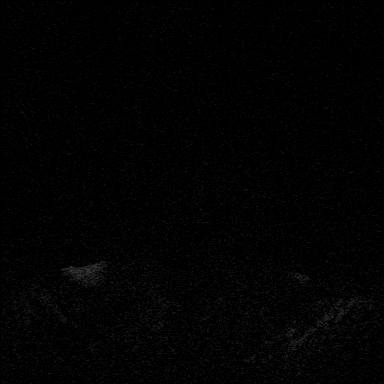

[Series 4: fl3d post-cm 20 · axial · 1.2mm · 1.04mm/px · z∈[-65,+106]mm · 8 of 144 slices shown (1 of 3)]
[im 1/144]
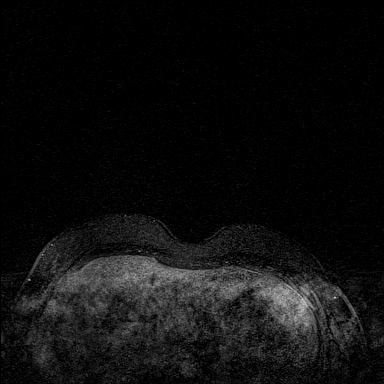
[im 23/144]
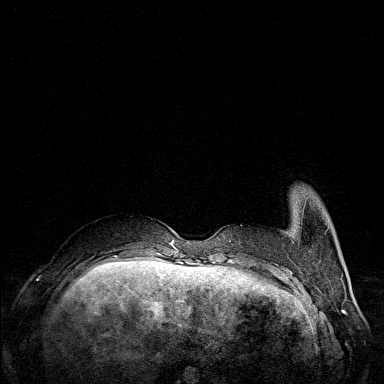
[im 45/144]
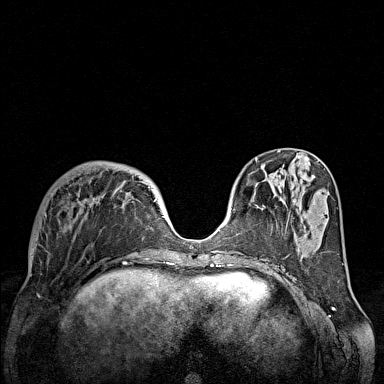
[im 67/144]
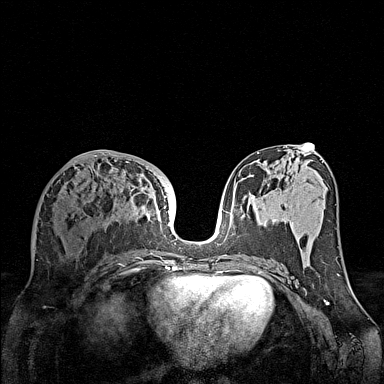
[im 78/144]
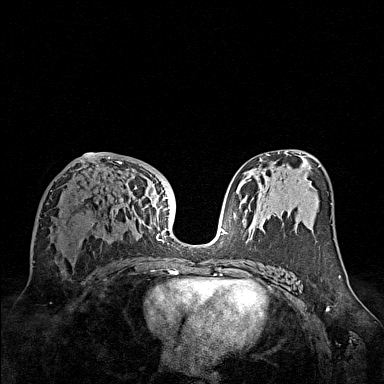
[im 100/144]
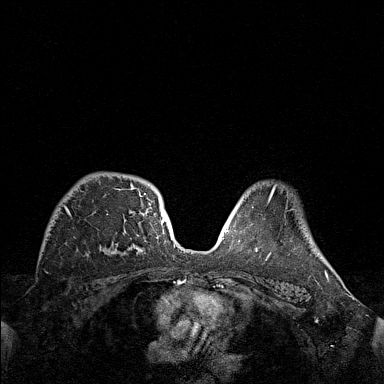
[im 122/144]
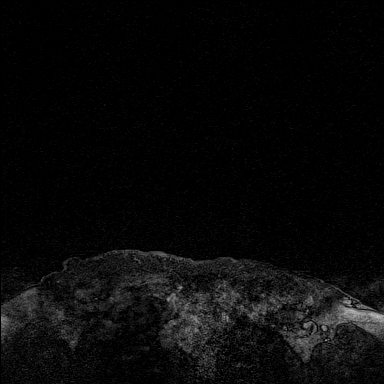
[im 144/144]
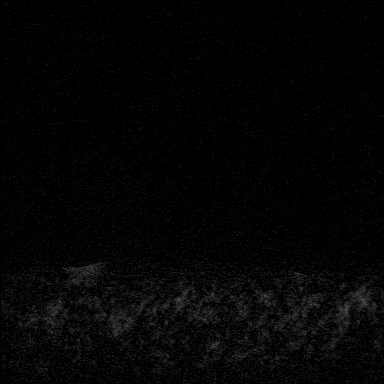

[Series 5: fl3d post-cm 20 · axial · 1.2mm · 1.04mm/px · z∈[-65,+106]mm · 8 of 144 slices shown (2 of 3)]
[im 1/144]
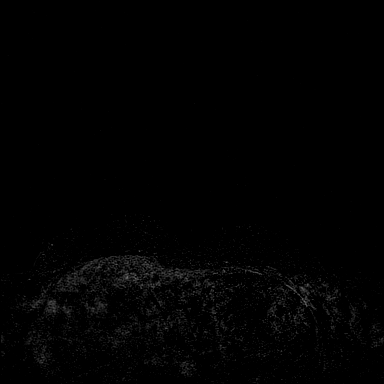
[im 23/144]
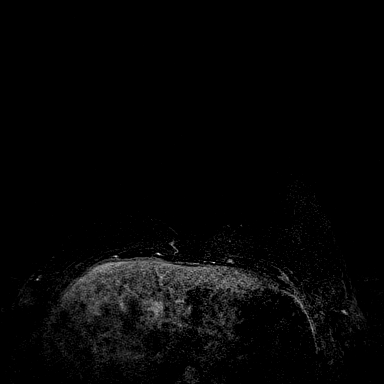
[im 45/144]
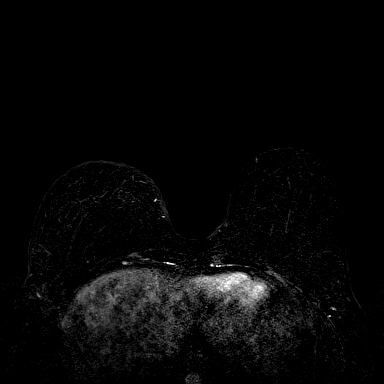
[im 67/144]
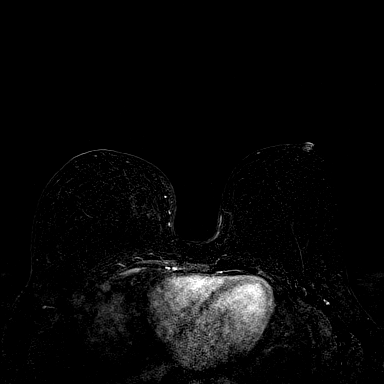
[im 78/144]
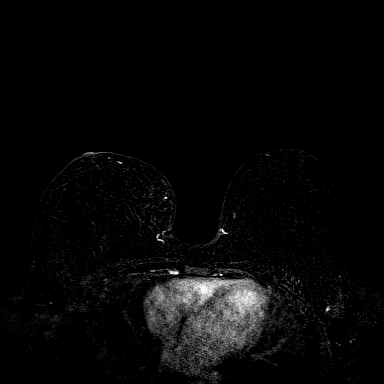
[im 100/144]
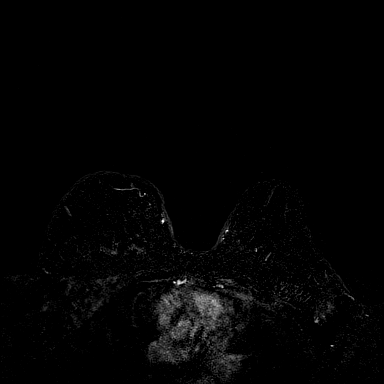
[im 122/144]
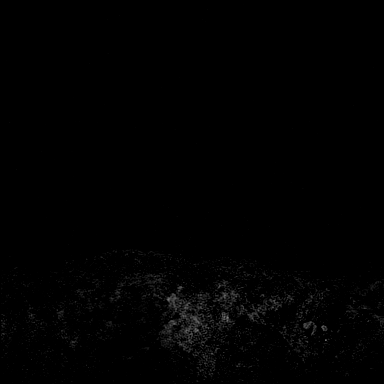
[im 144/144]
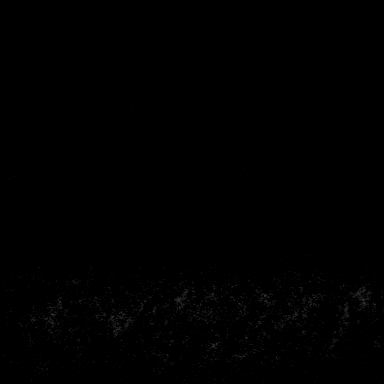

[Series 6: fl3d post-cm 20 · axial · 172.8mm · 1.04mm/px · 1 of 1 slices shown (3 of 3)]
[im 1/1]
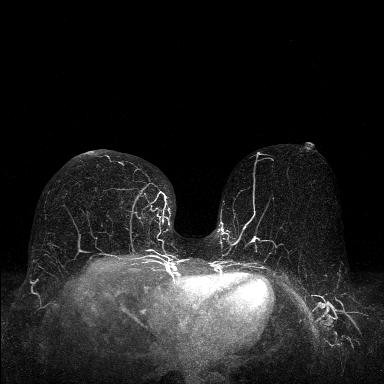

[30 of 48 positions shown; findings below may reference images not displayed]

Three-dimensional MR images were rendered by post-processing of the
original MR data on an independent workstation. The
three-dimensional MR images were interpreted, and findings are
reported in the following complete MRI report for this study. Three
dimensional images were evaluated at the independent interpreting
workstation using the DynaCAD thin client.
FINDINGS: Breast composition: c. Heterogeneous fibroglandular tissue.

Background parenchymal enhancement: Minimal

Right breast: No mass or abnormal enhancement. Post treatment and
surgical changes are noted.

Left breast: No mass or abnormal enhancement.

Lymph nodes: No abnormal appearing lymph nodes.

Ancillary findings:  None.
IMPRESSION: No MR evidence of breast malignancy.

RECOMMENDATION:
Bilateral diagnostic mammogram in [DATE] to resume annual
mammogram schedule.

Bilateral screening breast MR in 1 year in this high risk patient.

BI-RADS CATEGORY  2: Benign.

## 2020-08-27 MED ORDER — GADOBUTROL 1 MMOL/ML IV SOLN
7.0000 mL | Freq: Once | INTRAVENOUS | Status: AC | PRN
  Administered 2020-08-27: 7 mL via INTRAVENOUS

## 2020-08-29 ENCOUNTER — Inpatient Hospital Stay: Payer: No Typology Code available for payment source

## 2020-09-05 ENCOUNTER — Encounter: Payer: Self-pay | Admitting: Oncology

## 2020-09-05 ENCOUNTER — Inpatient Hospital Stay: Payer: Self-pay | Attending: Oncology

## 2020-09-05 ENCOUNTER — Other Ambulatory Visit: Payer: Self-pay

## 2020-09-05 VITALS — BP 120/80 | HR 100 | Temp 98.5°F | Resp 18

## 2020-09-05 DIAGNOSIS — Z79818 Long term (current) use of other agents affecting estrogen receptors and estrogen levels: Secondary | ICD-10-CM | POA: Insufficient documentation

## 2020-09-05 DIAGNOSIS — C50811 Malignant neoplasm of overlapping sites of right female breast: Secondary | ICD-10-CM | POA: Insufficient documentation

## 2020-09-05 DIAGNOSIS — Z9221 Personal history of antineoplastic chemotherapy: Secondary | ICD-10-CM | POA: Insufficient documentation

## 2020-09-05 DIAGNOSIS — Z171 Estrogen receptor negative status [ER-]: Secondary | ICD-10-CM | POA: Insufficient documentation

## 2020-09-05 DIAGNOSIS — Z923 Personal history of irradiation: Secondary | ICD-10-CM | POA: Insufficient documentation

## 2020-09-05 DIAGNOSIS — Z01419 Encounter for gynecological examination (general) (routine) without abnormal findings: Secondary | ICD-10-CM

## 2020-09-05 DIAGNOSIS — Z95828 Presence of other vascular implants and grafts: Secondary | ICD-10-CM

## 2020-09-05 MED ORDER — LEUPROLIDE ACETATE 3.75 MG IM KIT
3.7500 mg | PACK | Freq: Once | INTRAMUSCULAR | Status: AC
  Administered 2020-09-05: 3.75 mg via INTRAMUSCULAR
  Filled 2020-09-05: qty 3.75

## 2020-09-05 NOTE — Progress Notes (Signed)
Met with patient/interpreter at registration to provide Good Faith Estimate and gave a CFAA([facility] Financial Assistance Application) to complete and return along with supporting documents to be sent to the [place]: Customer Service.  She has my card for any additional financial questions or concerns.

## 2020-09-05 NOTE — Patient Instructions (Signed)
Leuprolide injection What is this medicine? LEUPROLIDE (loo PROE lide) is a man-made hormone. It is used to treat the symptoms of prostate cancer. This medicine may also be used to treat children with early onset of puberty. It may be used for other hormonal conditions. This medicine may be used for other purposes; ask your health care provider or pharmacist if you have questions. COMMON BRAND NAME(S): Lupron What should I tell my health care provider before I take this medicine? They need to know if you have any of these conditions:  diabetes  heart disease or previous heart attack  high blood pressure  high cholesterol  pain or difficulty passing urine  spinal cord metastasis  stroke  tobacco smoker  an unusual or allergic reaction to leuprolide, benzyl alcohol, other medicines, foods, dyes, or preservatives  pregnant or trying to get pregnant  breast-feeding How should I use this medicine? This medicine is for injection under the skin or into a muscle. You will be taught how to prepare and give this medicine. Use exactly as directed. Take your medicine at regular intervals. Do not take your medicine more often than directed. It is important that you put your used needles and syringes in a special sharps container. Do not put them in a trash can. If you do not have a sharps container, call your pharmacist or healthcare provider to get one. A special MedGuide will be given to you by the pharmacist with each prescription and refill. Be sure to read this information carefully each time. Talk to your pediatrician regarding the use of this medicine in children. While this medicine may be prescribed for children as young as 8 years for selected conditions, precautions do apply. Overdosage: If you think you have taken too much of this medicine contact a poison control center or emergency room at once. NOTE: This medicine is only for you. Do not share this medicine with others. What if  I miss a dose? If you miss a dose, take it as soon as you can. If it is almost time for your next dose, take only that dose. Do not take double or extra doses. What may interact with this medicine? Do not take this medicine with any of the following medications:  chasteberry  cisapride  dronedarone  pimozide  thioridazine This medicine may also interact with the following medications:  herbal or dietary supplements, like black cohosh or DHEA  female hormones, like estrogens or progestins and birth control pills, patches, rings, or injections  female hormones, like testosterone  other medicines that prolong the QT interval (abnormal heart rhythm) This list may not describe all possible interactions. Give your health care provider a list of all the medicines, herbs, non-prescription drugs, or dietary supplements you use. Also tell them if you smoke, drink alcohol, or use illegal drugs. Some items may interact with your medicine. What should I watch for while using this medicine? Visit your doctor or health care professional for regular checks on your progress. During the first week, your symptoms may get worse, but then will improve as you continue your treatment. You may get hot flashes, increased bone pain, increased difficulty passing urine, or an aggravation of nerve symptoms. Discuss these effects with your doctor or health care professional, some of them may improve with continued use of this medicine. Female patients may experience a menstrual cycle or spotting during the first 2 months of therapy with this medicine. If this continues, contact your doctor or health care professional.   This medicine may increase blood sugar. Ask your healthcare provider if changes in diet or medicines are needed if you have diabetes. What side effects may I notice from receiving this medicine? Side effects that you should report to your doctor or health care professional as soon as possible:  allergic  reactions like skin rash, itching or hives, swelling of the face, lips, or tongue  breathing problems  chest pain  depression or memory disorders  pain in your legs or groin  pain at site where injected  severe headache  signs and symptoms of high blood sugar such as being more thirsty or hungry or having to urinate more than normal. You may also feel very tired or have blurry vision  swelling of the feet and legs  visual changes  vomiting Side effects that usually do not require medical attention (report to your doctor or health care professional if they continue or are bothersome):  breast swelling or tenderness  decrease in sex drive or performance  diarrhea  hot flashes  loss of appetite  muscle, joint, or bone pains  nausea  redness or irritation at site where injected  skin problems or acne This list may not describe all possible side effects. Call your doctor for medical advice about side effects. You may report side effects to FDA at 1-800-FDA-1088. Where should I keep my medicine? Keep out of the reach of children. Store below 25 degrees C (77 degrees F). Do not freeze. Protect from light. Do not use if it is not clear or if there are particles present. Throw away any unused medicine after the expiration date. NOTE: This sheet is a summary. It may not cover all possible information. If you have questions about this medicine, talk to your doctor, pharmacist, or health care provider.  2021 Elsevier/Gold Standard (2019-05-04 10:57:41)  

## 2020-09-18 ENCOUNTER — Telehealth: Payer: Self-pay | Admitting: Medical

## 2020-09-18 ENCOUNTER — Other Ambulatory Visit: Payer: Self-pay

## 2020-09-18 ENCOUNTER — Inpatient Hospital Stay: Payer: Self-pay | Attending: Oncology | Admitting: Medical

## 2020-09-18 ENCOUNTER — Other Ambulatory Visit (HOSPITAL_COMMUNITY): Payer: Self-pay

## 2020-09-18 VITALS — BP 124/82 | HR 79 | Temp 97.9°F | Resp 16 | Ht 61.0 in | Wt 167.9 lb

## 2020-09-18 DIAGNOSIS — C50811 Malignant neoplasm of overlapping sites of right female breast: Secondary | ICD-10-CM | POA: Insufficient documentation

## 2020-09-18 DIAGNOSIS — Z5111 Encounter for antineoplastic chemotherapy: Secondary | ICD-10-CM | POA: Insufficient documentation

## 2020-09-18 DIAGNOSIS — M79601 Pain in right arm: Secondary | ICD-10-CM | POA: Insufficient documentation

## 2020-09-18 DIAGNOSIS — Z803 Family history of malignant neoplasm of breast: Secondary | ICD-10-CM | POA: Insufficient documentation

## 2020-09-18 DIAGNOSIS — Z171 Estrogen receptor negative status [ER-]: Secondary | ICD-10-CM

## 2020-09-18 DIAGNOSIS — Z79899 Other long term (current) drug therapy: Secondary | ICD-10-CM | POA: Insufficient documentation

## 2020-09-18 MED ORDER — METHOCARBAMOL 500 MG PO TABS
500.0000 mg | ORAL_TABLET | Freq: Four times a day (QID) | ORAL | 0 refills | Status: DC
  Filled 2020-09-18: qty 40, 10d supply, fill #0

## 2020-09-18 NOTE — Telephone Encounter (Signed)
Scheduled follow-up appointment per 4/4 schedule message. Patient is aware.

## 2020-09-19 NOTE — Progress Notes (Signed)
[facility] Progress Note   Brianna Owens [id number] 22-Oct-1990 30 y.o.  Brianna Owens is managed by Dr. Lurline Del  Actively treated with chemotherapy/immunotherapy/hormonal therapy: yes  Current therapy: Lupron  Last treated: 09/05/2020  Next scheduled appointment with provider: 01/16/2021  Assessment: Plan:    Malignant neoplasm of overlapping sites of right breast in female, estrogen receptor negative ([place])  Right arm pain - Plan: methocarbamol (ROBAXIN) 500 MG tablet   ER negative malignant neoplasm of the right breast: The patient continues to be managed by Dr. Jana Hakim and receives monthly Lupron injections.  She is scheduled to see Dr. Jana Hakim in follow-up on 01/16/2021 and will continue monthly Lupron injections pending this visit.  Right arm pain suspected secondary to muscle spasm: The patient was given a prescription for Robaxin.  Please see After Visit Summary for patient specific instructions.  Future Appointments  Date Time Provider [place]  10/05/2020  2:15 PM [facility] None  10/24/2020  2:00 PM [facility] None  10/24/2020  2:45 PM [facility] None  11/21/2020  2:00 PM [facility] [place] [facility] None  12/19/2020  2:00 PM [facility] [facility] None  01/16/2021  1:30 PM [facility] None  01/16/2021  2:00 PM Magrinat, [name], MD [facility] None  01/16/2021  2:45 PM [facility] [place] [facility] None    No orders of the defined types were placed in this encounter.      Subjective:   Patient ID:  Brianna Owens is a 30 y.o. (DOB 04/10/1991) female.  Chief Complaint: No chief complaint on file.   HPI Brianna Owens  is a 30 y.o. female with a diagnosis of an ER negative malignant neoplasm of the right breast.  She is followed by Dr. Jana Hakim and receives monthly Lupron injections.  She presents  to the clinic today with right arm pain that developed suddenly around 2 weeks ago.  She reports that the pain is constant and is around 8/10.  She has been taking Tylenol without benefit.  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  This job requires repetitive movement of her right arm and lifting.  This job is not new.  She denies any injury of her right upper extremity including cuts.  She presents to the clinic today with an interpreter.  Medications: I have reviewed the patient's current medications.  Allergies: No Known Allergies  Past Medical History:  Diagnosis Date  . Breast cancer ([place])   . Cancer ([place]) 05/05/2019  . Personal history of chemotherapy   . Personal history of radiation therapy     Past Surgical History:  Procedure Laterality Date  . BREAST BIOPSY    . BREAST LUMPECTOMY    . BREAST LUMPECTOMY WITH RADIOACTIVE SEED AND SENTINEL LYMPH NODE BIOPSY Right 11/17/2019   Procedure: RIGHT BREAST LUMPECTOMY WITH RADIOACTIVE SEED;  Surgeon: Erroll Luna, MD;  Location: [facility];  Service: General;  Laterality: Right;  PEC BLOCK  . IR IMAGING GUIDED PORT INSERTION  05/24/2019  . IR REMOVAL TUN ACCESS W/ PORT W/O FL MOD SED  07/18/2020  . SENTINEL NODE BIOPSY Right 11/17/2019   Procedure: Sentinel Node Biopsy;  Surgeon: Erroll Luna, MD;  Location: [facility];  Service: General;  Laterality: Right;    Family History  Problem Relation Age of Onset  . Breast cancer Mother   . Breast cancer Maternal Aunt     Social History  Socioeconomic History  . Marital status: Single    Spouse name: Not on file  . Number of children: Not on file  . Years of education: Not on file  . Highest education level: 9th grade  Occupational History  . Not on file  Tobacco Use  . Smoking status: Never Smoker  . Smokeless tobacco: Never Used  Vaping Use  . Vaping Use: Never used  Substance and Sexual Activity  . Alcohol use: Not Currently  . Drug use: Not Currently  .  Sexual activity: Not Currently    Birth control/protection: I.U.D.  Other Topics Concern  . Not on file  Social History Narrative  . Not on file   Social Determinants of Health   Financial Resource Strain: Not on file  Food Insecurity: Not on file  Transportation Needs: No Transportation Needs  . Lack of Transportation (Medical): No  . Lack of Transportation (Non-Medical): No  Physical Activity: Not on file  Stress: Not on file  Social Connections: Not on file  Intimate Partner Violence: Not on file    Past Medical History, Surgical history, Social history, and Family history were reviewed and updated as appropriate.   Please see review of systems for further details on the patient's review from today.   Review of Systems:  Review of Systems  Constitutional: Negative for chills, diaphoresis and fever.  HENT: Negative for trouble swallowing and voice change.   Respiratory: Negative for cough, chest tightness, shortness of breath and wheezing.   Cardiovascular: Negative for chest pain and palpitations.  Gastrointestinal: Negative for abdominal pain, constipation, diarrhea, nausea and vomiting.  Musculoskeletal: Positive for arthralgias and myalgias. Negative for back pain.  Neurological: Negative for dizziness, light-headedness and headaches.    Objective:   Physical Exam:  BP 124/82 (BP Location: Left Arm, Patient Position: Sitting)   Pulse 79   Temp 97.9 F (36.6 C) (Tympanic)   Resp 16   Ht '5\' 1"'  (1.549 m)   Wt 167 lb 14.4 oz (76.2 kg)   SpO2 100%   BMI 31.72 kg/m  ECOG: 0  Physical Exam Constitutional:      General: She is not in acute distress.    Appearance: She is not diaphoretic.  HENT:     Head: Normocephalic and atraumatic.  Eyes:     General: No scleral icterus.       Right eye: No discharge.        Left eye: No discharge.  Musculoskeletal:        General: Tenderness present. No swelling, deformity or signs of injury.       Arms:  Skin:     General: Skin is warm and dry.     Findings: No erythema or rash.  Neurological:     Mental Status: She is alert.     Coordination: Coordination normal.     Gait: Gait normal.  Psychiatric:        Mood and Affect: Mood normal.        Behavior: Behavior normal.        Thought Content: Thought content normal.        Judgment: Judgment normal.     Lab Review:     Component Value Date/Time   NA 137 07/18/2020 1215   K 5.4 (H) 07/18/2020 1215   CL 105 07/18/2020 1215   CO2 23 07/18/2020 1215   GLUCOSE 90 07/18/2020 1215   BUN 13 07/18/2020 1215   CREATININE 0.61 07/18/2020 1215   CREATININE 0.45  07/21/2019 1025   CALCIUM 9.4 07/18/2020 1215   PROT 8.3 (H) 03/16/2020 1011   ALBUMIN 4.0 03/16/2020 1011   AST 95 (H) 03/16/2020 1011   AST 29 07/21/2019 1025   ALT 193 (H) 03/16/2020 1011   ALT 58 (H) 07/21/2019 1025   ALKPHOS 131 (H) 03/16/2020 1011   BILITOT 0.4 03/16/2020 1011   BILITOT 0.3 07/21/2019 1025   GFRNONAA >60 07/18/2020 1215   GFRNONAA >60 07/21/2019 1025   GFRAA >60 03/16/2020 1011   GFRAA >60 07/21/2019 1025       Component Value Date/Time   WBC 7.2 07/18/2020 1215   RBC 3.85 (L) 07/18/2020 1215   HGB 12.4 07/18/2020 1215   HCT 35.2 (L) 07/18/2020 1215   PLT 226 07/18/2020 1215   MCV 91.4 07/18/2020 1215   MCH 32.2 07/18/2020 1215   MCHC 35.2 07/18/2020 1215   RDW 12.1 07/18/2020 1215   LYMPHSABS 2.1 07/18/2020 1215   MONOABS 0.6 07/18/2020 1215   EOSABS 0.5 07/18/2020 1215   BASOSABS 0.0 07/18/2020 1215   -------------------------------  Imaging from last 24 hours (if applicable):  Radiology interpretation: MR BREAST W & WO CM SCREENING (GI)  Result Date: 08/27/2020 CLINICAL DATA:  30 year old female with BRCA 1 gene mutation and high lifetime risk for developing recurrent breast cancer. History of RIGHT breast cancer, treatment and lumpectomy in 2021. LABS:  None performed today EXAM: BILATERAL BREAST MRI WITH AND WITHOUT CONTRAST TECHNIQUE:  Multiplanar, multisequence MR images of both breasts were obtained prior to and following the intravenous administration of 7 ml of Gadavist Three-dimensional MR images were rendered by post-processing of the original MR data on an independent workstation. The three-dimensional MR images were interpreted, and findings are reported in the following complete MRI report for this study. Three dimensional images were evaluated at the independent interpreting workstation using the DynaCAD thin client. COMPARISON:  05/31/2020 mammogram.  09/26/2019 and prior breast MRs FINDINGS: Breast composition: c. Heterogeneous fibroglandular tissue. Background parenchymal enhancement: Minimal Right breast: No mass or abnormal enhancement. Post treatment and surgical changes are noted. Left breast: No mass or abnormal enhancement. Lymph nodes: No abnormal appearing lymph nodes. Ancillary findings:  None. IMPRESSION: No MR evidence of breast malignancy. RECOMMENDATION: Bilateral diagnostic mammogram in December 2022 to resume annual mammogram schedule. Bilateral screening breast MR in 1 year in this high risk patient. BI-RADS CATEGORY  2: Benign. Electronically Signed   By: Margarette Canada M.D.   On: 08/27/2020 15:29

## 2020-09-26 ENCOUNTER — Telehealth: Payer: Self-pay | Admitting: Oncology

## 2020-09-26 ENCOUNTER — Inpatient Hospital Stay: Payer: No Typology Code available for payment source

## 2020-09-26 NOTE — Telephone Encounter (Signed)
R/s appt per 4/6 sch msg. Called pt, no answer. Left msg with appt date and time.

## 2020-10-05 ENCOUNTER — Other Ambulatory Visit: Payer: Self-pay

## 2020-10-05 ENCOUNTER — Inpatient Hospital Stay: Payer: No Typology Code available for payment source

## 2020-10-05 VITALS — BP 121/77 | HR 102 | Resp 18

## 2020-10-05 DIAGNOSIS — Z01419 Encounter for gynecological examination (general) (routine) without abnormal findings: Secondary | ICD-10-CM

## 2020-10-05 DIAGNOSIS — C50811 Malignant neoplasm of overlapping sites of right female breast: Secondary | ICD-10-CM

## 2020-10-05 DIAGNOSIS — Z95828 Presence of other vascular implants and grafts: Secondary | ICD-10-CM

## 2020-10-05 MED ORDER — LEUPROLIDE ACETATE 3.75 MG IM KIT
3.7500 mg | PACK | Freq: Once | INTRAMUSCULAR | Status: AC
  Administered 2020-10-05: 3.75 mg via INTRAMUSCULAR
  Filled 2020-10-05: qty 3.75

## 2020-10-05 NOTE — Patient Instructions (Addendum)
Leuprolide injection Qu es este medicamento? La LEUPROLIDA (leuprorelina) es una hormona artificial. [place] para tratar los sntomas del cncer de prstata. Este medicamento tambin puede usarse para tratar a nios con inicio temprano de la pubertad. Se puede usar para otras afecciones hormonales. Este medicamento puede ser utilizado para otros usos; si tiene alguna pregunta consulte con su proveedor de atencin mdica o con su farmacutico. MARCAS COMUNES: Lupron Qu le debo informar a mi profesional de la salud antes de tomar este medicamento? Necesitan saber si usted presenta alguno de los siguientes problemas o situaciones: diabetes enfermedad cardiaca o ataque cardiaco previo presin sangunea alta nivel de colesterol alto dolor o dificultad para orinar metstasis en la mdula espinal accidente cerebrovascular fuma tabaco una reaccin alrgica o inusual a la leuprolida (leuprorelina), al alcohol benclico, a otros medicamentos, alimentos, colorantes o conservantes si est embarazada o buscando quedar embarazada si est amamantando a un beb Cmo debo utilizar este medicamento? Este medicamento se administra mediante inyeccin debajo de la piel o en un msculo. Le ensearn cmo preparar y Biomedical engineer. Use el medicamento exactamente como se le indique. Use su medicamento a intervalos regulares. No use su medicamento con una frecuencia mayor a la indicada. Es importante que deseche las agujas y las jeringas usadas en un recipiente resistente a los pinchazos. No las deseche en la basura. Si no tiene un recipiente resistente a los pinchazos, llame a su farmacutico o proveedor de atencin de la salud para obtenerlo. Su farmacutico le dar una Gua del medicamento especial (MedGuide, nombre en ingls) con cada receta y en cada ocasin que la vuelva a surtir. Asegrese de leer esta informacin cada vez cuidadosamente. Hable con su pediatra para informarse acerca del uso de este  medicamento en nios. Aunque este medicamento se puede recetar a nios tan pequeos como de 8 aos de edad con ciertas afecciones, existen precauciones que deben tomarse. Sobredosis: Pngase en contacto inmediatamente con un centro toxicolgico o una sala de urgencia si usted cree que haya tomado demasiado medicamento. ATENCIN: [employer] es solo para usted. No comparta este medicamento con nadie. Qu sucede si me olvido de una dosis? Si olvida una dosis, adminstrela lo antes posible. Si es casi la hora de la prxima dosis, administre solo esa dosis. No se administre dosis adicionales o dobles. Qu puede interactuar con este medicamento? No use este medicamento con ninguno de los siguientes frmacos: sauzgatillo cisaprida dronedarona pimozida tioridazina Este medicamento tambin puede interactuar con los siguientes frmacos: suplementos dietticos o de hierbas, tales como cohosh negro o DHEA hormonas femeninas, tales como estrgenos o progestinas, y pldoras, parches, anillos o inyecciones anticonceptivos hormonas masculinas, tales como testosterona otros medicamentos que prolongan el intervalo QT (ritmo cardiaco anormal) Puede ser que esta lista no menciona todas las posibles interacciones. Informe a su profesional de [employer]	[place] de [employer] productos a base de hierbas, medicamentos de [place] o suplementos nutritivos que est tomando. Si usted fuma, consume bebidas alcohlicas o si utiliza drogas ilegales, indqueselo tambin a su profesional de [employer]	[place]. Algunas sustancias pueden interactuar con su medicamento. A qu debo estar atento al usar [employer]? Visite a su mdico o a su profesional de la salud para que revise regularmente su evolucin. Durante la primera semana, sus sntomas podran empeorar, pero luego mejorarn a medida que contine su tratamiento. Es posible que tenga sofocos, dolor seo ms intenso, mayor dificultad para Garment/textile technologist o un agravamiento de los sntomas  neurolgicos. [employer] con [place]  mdico o con su profesional de [employer]	[place]. Algunos pueden mejorar con el uso continuo de [place]. Las mujeres pueden tener un ciclo menstrual o [employer] durante los primeros 2 meses de terapia con este medicamento. Si esto contina, contacte a su mdico o profesional de [employer]	[place]. Este medicamento puede aumentar los niveles de Dispensing optician. Pregntele a su profesional de la salud si es Chartered loss adjuster cambios en la dieta o en los medicamentos si usted tiene diabetes. Qu efectos secundarios puedo tener al [employer] este medicamento? Efectos secundarios que debe informar a su mdico o a Barrister's clerk de la salud tan pronto como sea posible: Chief of Staff, tales como erupcin cutnea, comezn/picazn o urticaria, e hinchazn de la cara, los labios o la lengua problemas para Contractor en el pecho depresin o trastornos de Programmer, applications en las piernas o la entrepierna Management consultant de la inyeccin dolor de cabeza grave signos y sntomas de niveles altos de Location manager en la sangre, tales como tener ms sed o apetito, o tener que orinar con mayor frecuencia que lo habitual. Tambin puede sentirse muy cansado o tener visin borrosa hinchazn de los pies y las piernas cambios en la visin vmito Efectos secundarios que generalmente no requieren atencin mdica (infrmelos a su mdico o a su profesional de la salud si persisten o si son molestos): sensibilidad o hinchazn de las mamas disminucin del deseo o desempeo sexual diarrea sofocos prdida del apetito Patent examiner, articulaciones o huesos nuseas enrojecimiento o Actor de la inyeccin problemas de piel o acn Puede ser que esta lista no menciona todos los posibles efectos secundarios. Comunquese a su mdico por asesoramiento mdico [employer] Usted puede informar los efectos secundarios a la FDA por telfono al  1-800-FDA-1088. Dnde debo guardar mi medicina? Mantenga fuera del alcance de los nios. Guarde a una temperatura inferior a 25 grados Celsius (77 grados Fahrenheit). No congele. Proteja de Naval architect. No lo use si no est transparente o si tiene partculas. Deseche todo el medicamento que no haya utilizado despus de la fecha de vencimiento. ATENCIN: Este folleto es un resumen. Puede ser que no cubra toda la posible informacin. Si usted tiene preguntas acerca de esta medicina, consulte con su mdico, su farmacutico o su profesional de Technical sales engineer.  2021 Elsevier/Gold Standard (2019-10-06 00:00:00)

## 2020-10-15 ENCOUNTER — Telehealth: Payer: Self-pay | Admitting: Oncology

## 2020-10-15 NOTE — Telephone Encounter (Signed)
Scheduled appts per 5/2 sch msg. Using interpreter line I left a msg with appts dates and times.

## 2020-10-24 ENCOUNTER — Ambulatory Visit: Payer: Self-pay

## 2020-10-24 ENCOUNTER — Other Ambulatory Visit: Payer: No Typology Code available for payment source

## 2020-10-31 ENCOUNTER — Ambulatory Visit: Payer: No Typology Code available for payment source

## 2020-11-07 ENCOUNTER — Other Ambulatory Visit: Payer: Self-pay

## 2020-11-07 ENCOUNTER — Inpatient Hospital Stay: Payer: No Typology Code available for payment source | Attending: Oncology

## 2020-11-07 VITALS — BP 127/77 | HR 90 | Temp 98.2°F

## 2020-11-07 DIAGNOSIS — Z01419 Encounter for gynecological examination (general) (routine) without abnormal findings: Secondary | ICD-10-CM

## 2020-11-07 DIAGNOSIS — Z171 Estrogen receptor negative status [ER-]: Secondary | ICD-10-CM | POA: Insufficient documentation

## 2020-11-07 DIAGNOSIS — Z95828 Presence of other vascular implants and grafts: Secondary | ICD-10-CM

## 2020-11-07 DIAGNOSIS — C50811 Malignant neoplasm of overlapping sites of right female breast: Secondary | ICD-10-CM | POA: Insufficient documentation

## 2020-11-07 DIAGNOSIS — Z5111 Encounter for antineoplastic chemotherapy: Secondary | ICD-10-CM | POA: Insufficient documentation

## 2020-11-07 MED ORDER — LEUPROLIDE ACETATE 3.75 MG IM KIT
3.7500 mg | PACK | Freq: Once | INTRAMUSCULAR | Status: AC
  Administered 2020-11-07: 3.75 mg via INTRAMUSCULAR
  Filled 2020-11-07: qty 3.75

## 2020-11-21 ENCOUNTER — Ambulatory Visit: Payer: No Typology Code available for payment source

## 2020-11-28 ENCOUNTER — Ambulatory Visit: Payer: No Typology Code available for payment source

## 2020-12-05 ENCOUNTER — Inpatient Hospital Stay: Payer: Self-pay | Attending: Oncology

## 2020-12-05 ENCOUNTER — Other Ambulatory Visit: Payer: Self-pay

## 2020-12-05 ENCOUNTER — Encounter: Payer: Self-pay | Admitting: Oncology

## 2020-12-05 VITALS — BP 122/89 | HR 97 | Temp 98.9°F | Resp 18

## 2020-12-05 DIAGNOSIS — Z95828 Presence of other vascular implants and grafts: Secondary | ICD-10-CM

## 2020-12-05 DIAGNOSIS — Z5111 Encounter for antineoplastic chemotherapy: Secondary | ICD-10-CM | POA: Insufficient documentation

## 2020-12-05 DIAGNOSIS — Z171 Estrogen receptor negative status [ER-]: Secondary | ICD-10-CM | POA: Insufficient documentation

## 2020-12-05 DIAGNOSIS — Z01419 Encounter for gynecological examination (general) (routine) without abnormal findings: Secondary | ICD-10-CM

## 2020-12-05 DIAGNOSIS — C50811 Malignant neoplasm of overlapping sites of right female breast: Secondary | ICD-10-CM | POA: Insufficient documentation

## 2020-12-05 MED ORDER — LEUPROLIDE ACETATE 3.75 MG IM KIT
3.7500 mg | PACK | Freq: Once | INTRAMUSCULAR | Status: AC
  Administered 2020-12-05: 14:00:00 3.75 mg via INTRAMUSCULAR
  Filled 2020-12-05: qty 3.75

## 2020-12-05 NOTE — Patient Instructions (Addendum)
Leuprolide injection Qu es este medicamento? La LEUPROLIDA (leuprorelina) es una hormona artificial. Se usa para tratar los sntomas del cncer de prstata. Este medicamento tambin puede usarse para tratar a nios con inicio temprano de la pubertad. Se puede usar para otras afecciones hormonales. Este medicamento puede ser utilizado para otros usos; si tiene alguna pregunta consulte con su proveedor de atencin mdica o con su farmacutico. MARCAS COMUNES: Lupron Qu le debo informar a mi profesional de la salud antes de tomar este medicamento? Necesitan saber si usted presenta alguno de los siguientes problemas o situaciones: diabetes enfermedad cardiaca o ataque cardiaco previo presin sangunea alta nivel de colesterol alto dolor o dificultad para orinar metstasis en la mdula espinal accidente cerebrovascular fuma tabaco una reaccin alrgica o inusual a la leuprolida (leuprorelina), al alcohol benclico, a otros medicamentos, alimentos, colorantes o conservantes si est embarazada o buscando quedar embarazada si est amamantando a un beb Cmo debo utilizar este medicamento? Este medicamento se administra mediante inyeccin debajo de la piel o en un msculo. Le ensearn cmo preparar y administrar este medicamento. Use el medicamento exactamente como se le indique. Use su medicamento a intervalos regulares. No use su medicamento con una frecuencia mayor a la indicada. Es importante que deseche las agujas y las jeringas usadas en un recipiente resistente a los pinchazos. No las deseche en la basura. Si no tiene un recipiente resistente a los pinchazos, llame a su farmacutico o proveedor de atencin de la salud para obtenerlo. Su farmacutico le dar una Gua del medicamento especial (MedGuide, nombre en ingls) con cada receta y en cada ocasin que la vuelva a surtir. Asegrese de leer esta informacin cada vez cuidadosamente. Hable con su pediatra para informarse acerca del uso de este  medicamento en nios. Aunque este medicamento se puede recetar a nios tan pequeos como de 8 aos de edad con ciertas afecciones, existen precauciones que deben tomarse. Sobredosis: Pngase en contacto inmediatamente con un centro toxicolgico o una sala de urgencia si usted cree que haya tomado demasiado medicamento. ATENCIN: Este medicamento es solo para usted. No comparta este medicamento con nadie. Qu sucede si me olvido de una dosis? Si olvida una dosis, adminstrela lo antes posible. Si es casi la hora de la prxima dosis, administre solo esa dosis. No se administre dosis adicionales o dobles. Qu puede interactuar con este medicamento? No use este medicamento con ninguno de los siguientes frmacos: sauzgatillo cisaprida dronedarona pimozida tioridazina Este medicamento tambin puede interactuar con los siguientes frmacos: suplementos dietticos o de hierbas, tales como cohosh negro o DHEA hormonas femeninas, tales como estrgenos o progestinas, y pldoras, parches, anillos o inyecciones anticonceptivos hormonas masculinas, tales como testosterona otros medicamentos que prolongan el intervalo QT (ritmo cardiaco anormal) Puede ser que esta lista no menciona todas las posibles interacciones. Informe a su profesional de la salud de todos los productos a base de hierbas, medicamentos de venta libre o suplementos nutritivos que est tomando. Si usted fuma, consume bebidas alcohlicas o si utiliza drogas ilegales, indqueselo tambin a su profesional de la salud. Algunas sustancias pueden interactuar con su medicamento. A qu debo estar atento al usar este medicamento? Visite a su mdico o a su profesional de la salud para que revise regularmente su evolucin. Durante la primera semana, sus sntomas podran empeorar, pero luego mejorarn a medida que contine su tratamiento. Es posible que tenga sofocos, dolor seo ms intenso, mayor dificultad para orinar o un agravamiento de los sntomas  neurolgicos. Converse sobre estos efectos con su   continuo de estemedicamento. Las mujeres pueden tener un ciclo menstrual o [employer] durante los primeros 2 meses de terapia con este medicamento. Si esto contina, contacte a su mdico oprofesional de [employer]	[place]. Este medicamento puede aumentar los niveles de Dispensing optician. Pregntele a su profesional de la salud si es necesario realizar cambios en la dieta o enlos medicamentos si usted tiene diabetes. Qu efectos secundarios puedo tener al [employer] este medicamento? Efectos secundarios que debe informar a su mdico o a su profesional de lasalud tan pronto como sea posible: Chief of Staff, tales como erupcin cutnea, comezn/picazn o urticaria, e hinchazn de la cara, los labios o la lengua problemas para Contractor en el pecho depresin o trastornos de Programmer, applications en las piernas o la entrepierna Management consultant de la inyeccin dolor de cabeza grave signos y sntomas de niveles altos de Location manager en la sangre, tales como tener ms sed o apetito, o tener que orinar con mayor frecuencia que lo habitual. Tambin puede sentirse muy cansado o tener visin borrosa hinchazn de los pies y las piernascambios en la visin vmito Efectos secundarios que generalmente no requieren atencin mdica (infrmelos asu mdico o a su profesional de la salud si persisten o si son molestos): sensibilidad o hinchazn de las mamas disminucin del deseo o desempeo sexual diarrea sofocos prdida del apetito Patent examiner, articulaciones o huesos nuseas enrojecimiento o Actor de la inyeccinproblemas de piel o acn Puede ser que esta lista no menciona todos los posibles efectos secundarios. Comunquese a su mdico por asesoramiento mdico [employer]. Usted puede informar los efectos secundarios a la FDA por telfono al1-800-FDA-1088. Dnde debo guardar mi  medicina? Mantenga fuera del alcance de los nios. Guarde a una temperatura inferior a 25 grados Celsius (77 grados Fahrenheit). No congele. Proteja de Naval architect. No lo use si no est transparente o si tiene partculas. Deseche todo el medicamento que no haya utilizado despus de lafecha de vencimiento. ATENCIN: Este folleto es un resumen. Puede ser que no cubra toda la posible informacin. Si usted tiene preguntas acerca de esta medicina, consulte con sumdico, su farmacutico o su profesional de Technical sales engineer.  2022 Elsevier/Gold Standard (2019-10-06 00:00:00)

## 2020-12-19 ENCOUNTER — Ambulatory Visit: Payer: No Typology Code available for payment source

## 2020-12-22 ENCOUNTER — Encounter (HOSPITAL_COMMUNITY): Payer: Self-pay

## 2020-12-26 ENCOUNTER — Ambulatory Visit: Payer: No Typology Code available for payment source

## 2021-01-02 ENCOUNTER — Other Ambulatory Visit: Payer: Self-pay

## 2021-01-02 ENCOUNTER — Inpatient Hospital Stay: Payer: Self-pay | Attending: Oncology

## 2021-01-02 VITALS — BP 130/90 | HR 91 | Temp 99.2°F | Resp 20

## 2021-01-02 DIAGNOSIS — C50811 Malignant neoplasm of overlapping sites of right female breast: Secondary | ICD-10-CM | POA: Insufficient documentation

## 2021-01-02 DIAGNOSIS — Z171 Estrogen receptor negative status [ER-]: Secondary | ICD-10-CM | POA: Insufficient documentation

## 2021-01-02 DIAGNOSIS — Z95828 Presence of other vascular implants and grafts: Secondary | ICD-10-CM

## 2021-01-02 DIAGNOSIS — Z01419 Encounter for gynecological examination (general) (routine) without abnormal findings: Secondary | ICD-10-CM

## 2021-01-02 MED ORDER — LEUPROLIDE ACETATE 3.75 MG IM KIT
3.7500 mg | PACK | Freq: Once | INTRAMUSCULAR | Status: AC
  Administered 2021-01-02: 3.75 mg via INTRAMUSCULAR
  Filled 2021-01-02: qty 3.75

## 2021-01-02 NOTE — Patient Instructions (Addendum)
Leuprolide injection Qu es este medicamento? La LEUPROLIDA (leuprorelina) es una hormona artificial. Se usa para tratar los sntomas del cncer de prstata. Este medicamento tambin puede usarse para tratar a nios con inicio temprano de la pubertad. Se puede usar para otras afecciones hormonales. Este medicamento puede ser utilizado para otros usos; si tiene alguna pregunta consulte con su proveedor de atencin mdica o con su farmacutico. MARCAS COMUNES: Lupron Qu le debo informar a mi profesional de la salud antes de tomar este medicamento? Necesitan saber si usted presenta alguno de los siguientes problemas o situaciones: diabetes enfermedad cardiaca o ataque cardiaco previo presin sangunea alta nivel de colesterol alto dolor o dificultad para orinar metstasis en la mdula espinal accidente cerebrovascular fuma tabaco una reaccin alrgica o inusual a la leuprolida (leuprorelina), al alcohol benclico, a otros medicamentos, alimentos, colorantes o conservantes si est embarazada o buscando quedar embarazada si est amamantando a un beb Cmo debo utilizar este medicamento? Este medicamento se administra mediante inyeccin debajo de la piel o en un msculo. Le ensearn cmo preparar y administrar este medicamento. Use el medicamento exactamente como se le indique. Use su medicamento a intervalos regulares. No use su medicamento con una frecuencia mayor a la indicada. Es importante que deseche las agujas y las jeringas usadas en un recipiente resistente a los pinchazos. No las deseche en la basura. Si no tiene un recipiente resistente a los pinchazos, llame a su farmacutico o proveedor de atencin de la salud para obtenerlo. Su farmacutico le dar una Gua del medicamento especial (MedGuide, nombre en ingls) con cada receta y en cada ocasin que la vuelva a surtir. Asegrese de leer esta informacin cada vez cuidadosamente. Hable con su pediatra para informarse acerca del uso de este  medicamento en nios. Aunque este medicamento se puede recetar a nios tan pequeos como de 8 aos de edad con ciertas afecciones, existen precauciones que deben tomarse. Sobredosis: Pngase en contacto inmediatamente con un centro toxicolgico o una sala de urgencia si usted cree que haya tomado demasiado medicamento. ATENCIN: Este medicamento es solo para usted. No comparta este medicamento con nadie. Qu sucede si me olvido de una dosis? Si olvida una dosis, adminstrela lo antes posible. Si es casi la hora de la prxima dosis, administre solo esa dosis. No se administre dosis adicionales o dobles. Qu puede interactuar con este medicamento? No use este medicamento con ninguno de los siguientes frmacos: sauzgatillo cisaprida dronedarona pimozida tioridazina Este medicamento tambin puede interactuar con los siguientes frmacos: suplementos dietticos o de hierbas, tales como cohosh negro o DHEA hormonas femeninas, tales como estrgenos o progestinas, y pldoras, parches, anillos o inyecciones anticonceptivos hormonas masculinas, tales como testosterona otros medicamentos que prolongan el intervalo QT (ritmo cardiaco anormal) Puede ser que esta lista no menciona todas las posibles interacciones. Informe a su profesional de la salud de todos los productos a base de hierbas, medicamentos de venta libre o suplementos nutritivos que est tomando. Si usted fuma, consume bebidas alcohlicas o si utiliza drogas ilegales, indqueselo tambin a su profesional de la salud. Algunas sustancias pueden interactuar con su medicamento. A qu debo estar atento al usar este medicamento? Visite a su mdico o a su profesional de la salud para que revise regularmente su evolucin. Durante la primera semana, sus sntomas podran empeorar, pero luego mejorarn a medida que contine su tratamiento. Es posible que tenga sofocos, dolor seo ms intenso, mayor dificultad para orinar o un agravamiento de los sntomas  neurolgicos. Converse sobre estos efectos con su   mdico o con su profesional de la salud. Algunos pueden mejorar con el uso continuo de este medicamento. Las mujeres pueden tener un ciclo menstrual o manchado durante los primeros 2 meses de terapia con este medicamento. Si esto contina, contacte a su mdico o profesional de la salud. Este medicamento puede aumentar los niveles de azcar en la sangre. Pregntele a su profesional de la salud si es necesario realizar cambios en la dieta o en los medicamentos si usted tiene diabetes. Qu efectos secundarios puedo tener al utilizar este medicamento? Efectos secundarios que debe informar a su mdico o a su profesional de la salud tan pronto como sea posible: reacciones alrgicas, tales como erupcin cutnea, comezn/picazn o urticaria, e hinchazn de la cara, los labios o la lengua problemas para respirar dolor en el pecho depresin o trastornos de la memoria dolor en las piernas o la entrepierna dolor en el lugar de la inyeccin dolor de cabeza grave signos y sntomas de niveles altos de azcar en la sangre, tales como tener ms sed o apetito, o tener que orinar con mayor frecuencia que lo habitual. Tambin puede sentirse muy cansado o tener visin borrosa hinchazn de los pies y las piernas cambios en la visin vmito Efectos secundarios que generalmente no requieren atencin mdica (infrmelos a su mdico o a su profesional de la salud si persisten o si son molestos): sensibilidad o hinchazn de las mamas disminucin del deseo o desempeo sexual diarrea sofocos prdida del apetito dolores en los msculos, articulaciones o huesos nuseas enrojecimiento o irritacin en el lugar de la inyeccin problemas de piel o acn Puede ser que esta lista no menciona todos los posibles efectos secundarios. Comunquese a su mdico por asesoramiento mdico sobre los efectos secundarios. Usted puede informar los efectos secundarios a la FDA por telfono al  1-800-FDA-1088. Dnde debo guardar mi medicina? Mantenga fuera del alcance de los nios. Guarde a una temperatura inferior a 25 grados Celsius (77 grados Fahrenheit). No congele. Proteja de la luz. No lo use si no est transparente o si tiene partculas. Deseche todo el medicamento que no haya utilizado despus de la fecha de vencimiento. ATENCIN: Este folleto es un resumen. Puede ser que no cubra toda la posible informacin. Si usted tiene preguntas acerca de esta medicina, consulte con su mdico, su farmacutico o su profesional de la salud.  2022 Elsevier/Gold Standard (2019-10-06 00:00:00)  

## 2021-01-16 ENCOUNTER — Ambulatory Visit: Payer: No Typology Code available for payment source | Admitting: Oncology

## 2021-01-16 ENCOUNTER — Ambulatory Visit: Payer: No Typology Code available for payment source

## 2021-01-16 ENCOUNTER — Other Ambulatory Visit: Payer: No Typology Code available for payment source

## 2021-01-23 ENCOUNTER — Ambulatory Visit: Payer: No Typology Code available for payment source

## 2021-01-23 ENCOUNTER — Ambulatory Visit: Payer: No Typology Code available for payment source | Admitting: Oncology

## 2021-01-30 ENCOUNTER — Other Ambulatory Visit: Payer: Self-pay

## 2021-01-30 ENCOUNTER — Inpatient Hospital Stay: Payer: Self-pay

## 2021-01-30 ENCOUNTER — Inpatient Hospital Stay: Payer: Self-pay | Attending: Oncology | Admitting: Oncology

## 2021-01-30 VITALS — BP 127/75 | HR 86 | Temp 97.9°F | Resp 18 | Ht 61.0 in | Wt 176.8 lb

## 2021-01-30 DIAGNOSIS — Z9221 Personal history of antineoplastic chemotherapy: Secondary | ICD-10-CM | POA: Insufficient documentation

## 2021-01-30 DIAGNOSIS — Z923 Personal history of irradiation: Secondary | ICD-10-CM | POA: Insufficient documentation

## 2021-01-30 DIAGNOSIS — Z803 Family history of malignant neoplasm of breast: Secondary | ICD-10-CM | POA: Insufficient documentation

## 2021-01-30 DIAGNOSIS — Z1502 Genetic susceptibility to malignant neoplasm of ovary: Secondary | ICD-10-CM

## 2021-01-30 DIAGNOSIS — C50811 Malignant neoplasm of overlapping sites of right female breast: Secondary | ICD-10-CM | POA: Insufficient documentation

## 2021-01-30 DIAGNOSIS — Z171 Estrogen receptor negative status [ER-]: Secondary | ICD-10-CM | POA: Insufficient documentation

## 2021-01-30 DIAGNOSIS — Z1501 Genetic susceptibility to malignant neoplasm of breast: Secondary | ICD-10-CM | POA: Insufficient documentation

## 2021-01-30 MED ORDER — LEUPROLIDE ACETATE 3.75 MG IM KIT
3.7500 mg | PACK | Freq: Once | INTRAMUSCULAR | Status: AC
  Administered 2021-01-30: 3.75 mg via INTRAMUSCULAR
  Filled 2021-01-30: qty 3.75

## 2021-01-30 NOTE — Patient Instructions (Signed)
Leuprolide injection What is this medication? LEUPROLIDE (loo PROE lide) is a man-made hormone. It is used to treat the symptoms of prostate cancer. This medicine may also be used to treat childrenwith early onset of puberty. It may be used for other hormonal conditions. This medicine may be used for other purposes; ask your health care provider orpharmacist if you have questions. COMMON BRAND NAME(S): Lupron What should I tell my care team before I take this medication? They need to know if you have any of these conditions: diabetes heart disease or previous heart attack high blood pressure high cholesterol pain or difficulty passing urine spinal cord metastasis stroke tobacco smoker an unusual or allergic reaction to leuprolide, benzyl alcohol, other medicines, foods, dyes, or preservatives pregnant or trying to get pregnant breast-feeding How should I use this medication? This medicine is for injection under the skin or into a muscle. You will be taught how to prepare and give this medicine. Use exactly as directed. Take your medicine at regular intervals. Do not take your medicine more often thandirected. It is important that you put your used needles and syringes in a special sharps container. Do not put them in a trash can. If you do not have a sharpscontainer, call your pharmacist or healthcare provider to get one. A special MedGuide will be given to you by the pharmacist with eachprescription and refill. Be sure to read this information carefully each time. Talk to your pediatrician regarding the use of this medicine in children. While this medicine may be prescribed for children as young as 8 years for selectedconditions, precautions do apply. Overdosage: If you think you have taken too much of this medicine contact apoison control center or emergency room at once. NOTE: This medicine is only for you. Do not share this medicine with others. What if I miss a dose? If you miss a  dose, take it as soon as you can. If it is almost time for yournext dose, take only that dose. Do not take double or extra doses. What may interact with this medication? Do not take this medicine with any of the following medications: chasteberry cisapride dronedarone pimozide thioridazine This medicine may also interact with the following medications: herbal or dietary supplements, like black cohosh or DHEA female hormones, like estrogens or progestins and birth control pills, patches, rings, or injections female hormones, like testosterone other medicines that prolong the QT interval (abnormal heart rhythm) This list may not describe all possible interactions. Give your health care provider a list of all the medicines, herbs, non-prescription drugs, or dietary supplements you use. Also tell them if you smoke, drink alcohol, or use illegaldrugs. Some items may interact with your medicine. What should I watch for while using this medication? Visit your doctor or health care professional for regular checks on your progress. During the first week, your symptoms may get worse, but then will improve as you continue your treatment. You may get hot flashes, increased bone pain, increased difficulty passing urine, or an aggravation of nerve symptoms. Discuss these effects with your doctor or health care professional, some ofthem may improve with continued use of this medicine. Female patients may experience a menstrual cycle or spotting during the first 2 months of therapy with this medicine. If this continues, contact your doctor orhealth care professional. This medicine may increase blood sugar. Ask your healthcare provider if changesin diet or medicines are needed if you have diabetes. What side effects may I notice from receiving this medication? Side   effects that you should report to your doctor or health care professionalas soon as possible: allergic reactions like skin rash, itching or hives,  swelling of the face, lips, or tongue breathing problems chest pain depression or memory disorders pain in your legs or groin pain at site where injected severe headache signs and symptoms of high blood sugar such as being more thirsty or hungry or having to urinate more than normal. You may also feel very tired or have blurry vision swelling of the feet and legs visual changes vomiting Side effects that usually do not require medical attention (report to yourdoctor or health care professional if they continue or are bothersome): breast swelling or tenderness decrease in sex drive or performance diarrhea hot flashes loss of appetite muscle, joint, or bone pains nausea redness or irritation at site where injected skin problems or acne This list may not describe all possible side effects. Call your doctor for medical advice about side effects. You may report side effects to FDA at1-800-FDA-1088. Where should I keep my medication? Keep out of the reach of children. Store below 25 degrees C (77 degrees F). Do not freeze. Protect from light. Do not use if it is not clear or if there are particles present. Throw away anyunused medicine after the expiration date. NOTE: This sheet is a summary. It may not cover all possible information. If you have questions about this medicine, talk to your doctor, pharmacist, orhealth care provider.  2022 Elsevier/Gold Standard (2019-05-04 10:57:41)  

## 2021-01-30 NOTE — Progress Notes (Signed)
[name]  [phone] [phone]     ID: Brianna Owens DOB: 06-27-1990  MR#: [id number]  ALP#:[id number]  Patient Care Team: Brianna Hakim [name], Owens as PCP - General (Oncology) Brianna Owens, [name], Owens as Consulting Physician (Oncology) Brianna Luna, Owens as Consulting Physician (General Surgery) Brianna Kaufmann, RN as Oncology Nurse Navigator Brianna Germany, RN as Oncology Nurse Navigator Brianna Owens as Attending Physician (Radiation Oncology) Brianna Cruel, Owens OTHER Owens:  CHIEF COMPLAINT: Triple negative breast cancer, BRCA1 positive  CURRENT TREATMENT: continuing Luprolide monthly   INTERVAL HISTORY: Brianna Owens returns today for follow up of her triple negative breast cancer.    We obtained an MRI of the breast with and without contrast 08/27/2020 which showed no evidence of breast malignancy.  She will be due for mammography next month if we want to continue the every 12-monthmonitoring for her BRCA positive high risk status.  She receives Lupron monthly, most recently 01/02/2021, with a dose due today.  She tolerates this with no side effects that she is aware of and is not plagued by menopausal symptoms at present.  REVIEW OF SYSTEMS: MAshantatells me that her aunt died from cancer.  It is unclear from her story who was treating her or whether she underwent hospice support at the end.  [name] feels well.  She works between 2 or 3 days a week, 3 hours at a time, and admits "I could work more".  She occasionally takes walks in the park with her 89year-Owens daughter MMaree Owens is not currently in preschool.  A detailed review of systems was otherwise stable   COVID 19 VACCINATION STATUS: Status post Pfizer x2 most recently August 2021    HISTORY OF CURRENT ILLNESS: From the original intake note:  MPinepresented to the [facility] with a 3 month history of  a right breast lump that became painful in early 04/2019. Physical exam performed at that time showed a palpable, tender 13 cm lump within the right center breast under the nipple area. [facility] showing: 6.1 cm palpable mass centered in the 5 o'clock position of the right breast; single right inferomedial axillary lymph node with focal cortical thickening inferiorly.  Accordingly on 05/04/2019 she proceeded to biopsy of the right breast area in question. The pathology from this procedure (([id number] showed: invasive ductal carcinoma, grade 2-3. Prognostic indicators significant for: estrogen receptor, 0% negative and progesterone receptor, 0% negative. Proliferation marker Ki67 at 40%.  I do not find HER-2 receptor documentation  The right axillary lymph node biopsied at that time showed reactive germinal centers.  She underwent bilateral diagnostic [facility] showing: breast density category D; biopsy-proven right breast malignancy measures 6.1 cm; no additional masses or calcifications seen in the right breast; no evidence of malignancy in the left breast.  The patient's subsequent history is as detailed below.   PAST MEDICAL HISTORY: Past Medical History:  Diagnosis Date   [place] [place] [place]   Personal history of chemotherapy    Personal history of radiation therapy     PAST SURGICAL HISTORY: Past Surgical History:  Procedure Laterality Date   BREAST BIOPSY     BREAST LUMPECTOMY     BREAST LUMPECTOMY WITH RADIOACTIVE SEED AND SENTINEL LYMPH NODE BIOPSY Right 11/17/2019   Procedure: RIGHT BREAST LUMPECTOMY  WITH RADIOACTIVE SEED;  Surgeon: Brianna Luna, Owens;  Location: [facility];  Service: General;  Laterality: Right;  PEC BLOCK   IR IMAGING GUIDED PORT INSERTION  05/24/2019   IR REMOVAL TUN ACCESS W/ PORT W/O FL MOD SED  07/18/2020   SENTINEL  NODE BIOPSY Right 11/17/2019   Procedure: Sentinel Node Biopsy;  Surgeon: Brianna Luna, Owens;  Location: [place];  Service: General;  Laterality: Right;    FAMILY HISTORY: Family History  Problem Relation Age of Onset   Breast cancer Mother    Breast cancer Maternal Aunt   Patient's father is 59 and her mother 60 as of November 2020.  The patient's mother was diagnosed with breast cancer in her early 23s.  She lives in [place]. The patient's mother's sister was also diagnosed with breast cancer in her early 66s.  The patient herself has 1 sister, no brothers.  She is not aware of any ovarian cancer cases in the family but tells me "all the women in my family had their ovaries removed".   GYNECOLOGIC HISTORY:  No LMP recorded. (Menstrual status: Irregular Periods). Menarche: 58 years Owens Age at first live birth: 46 years Owens [name] P 2 LMP regular Contraceptive HRT n/a  Hysterectomy? no BSO? no   SOCIAL HISTORY: (updated February 2022)  Brianna Owens normally works at [employer], Saturday and Sunday every other weekend.  She is originally from [place].  She is divorced.  Her children are Brianna Owens, living in [place] with his father, and Brianna Owens, 64 years Owens, who lives with the patient.     ADVANCED DIRECTIVES: Not in place   HEALTH MAINTENANCE: Social History   Tobacco Use   Smoking status: Never   Smokeless tobacco: Never  Vaping Use   Vaping Use: Never used  Substance Use Topics   Alcohol use: Not Currently   Drug use: Not Currently     Colonoscopy: n/a  PAP: 04/26/2019, negative  Bone density: n/a   No Known Allergies  Current Outpatient Medications  Medication Sig Dispense Refill   ibuprofen (ADVIL) 800 MG tablet Take 1 tablet (800 mg total) by mouth every 8 (eight) hours as needed. 30 tablet 0   methocarbamol (ROBAXIN) 500 MG tablet Take 1 tablet (500 mg total) by mouth 4 (four) times daily. 40 tablet 0   No current facility-administered medications for this  visit.    OBJECTIVE:  Spanish speaker who appears stated age  30:   01/30/21 1511  BP: 127/75  Pulse: 86  Resp: 18  Temp: 97.9 F (36.6 C)  SpO2: 100%      Body mass index is 33.41 kg/m.   Wt Readings from Last 3 Encounters:  01/30/21 176 lb 12.8 oz (80.2 kg)  09/18/20 167 lb 14.4 oz (76.2 kg)  08/01/20 167 lb 8 oz (76 kg)      ECOG FS:1 - Symptomatic but completely ambulatory  Sclerae unicteric, EOMs intact Wearing a mask No cervical or supraclavicular adenopathy Lungs no rales or rhonchi Heart regular rate and rhythm Abd soft, obese, nontender, positive bowel sounds MSK no focal spinal tenderness, no upper extremity lymphedema Neuro: nonfocal, well oriented, appropriate affect Breasts: Status post right lumpectomy and radiation, with no evidence of residual or recurrent disease.  Left breast is benign.  Both axillae are benign.  LAB RESULTS:  CMP     Component Value Date/Time   NA 137 07/18/2020 1215   K 5.4 (H) 07/18/2020 1215   CL 105 07/18/2020 1215  CO2 23 07/18/2020 1215   GLUCOSE 90 07/18/2020 1215   BUN 13 07/18/2020 1215   CREATININE 0.61 07/18/2020 1215   CREATININE 0.45 07/21/2019 1025   CALCIUM 9.4 07/18/2020 1215   PROT 8.3 (H) 03/16/2020 1011   ALBUMIN 4.0 03/16/2020 1011   AST 95 (H) 03/16/2020 1011   AST 29 07/21/2019 1025   ALT 193 (H) 03/16/2020 1011   ALT 58 (H) 07/21/2019 1025   ALKPHOS 131 (H) 03/16/2020 1011   BILITOT 0.4 03/16/2020 1011   BILITOT 0.3 07/21/2019 1025   GFRNONAA >60 07/18/2020 1215   GFRNONAA >60 07/21/2019 1025   GFRAA >60 03/16/2020 1011   GFRAA >60 07/21/2019 1025    No results found for: TOTALPROTELP, ALBUMINELP, A1GS, A2GS, BETS, BETA2SER, GAMS, MSPIKE, SPEI  No results found for: KPAFRELGTCHN, LAMBDASER, KAPLAMBRATIO  Lab Results  Component Value Date   WBC 7.2 07/18/2020   NEUTROABS 4.0 07/18/2020   HGB 12.4 07/18/2020   HCT 35.2 (L) 07/18/2020   MCV 91.4 07/18/2020   PLT 226 07/18/2020     No results found for: LABCA2  No components found for: [id number]  No results for input(s): INR in the last 168 hours.  No results found for: LABCA2  No results found for: BSW967  No results found for: RFF638  No results found for: GYK599  No results found for: CA2729  No components found for: HGQUANT  No results found for: CEA1 / No results found for: CEA1   No results found for: AFPTUMOR  No results found for: CHROMOGRNA  No results found for: HGBA, HGBA2QUANT, HGBFQUANT, HGBSQUAN (Hemoglobinopathy evaluation)   No results found for: LDH  No results found for: IRON, TIBC, IRONPCTSAT (Iron and TIBC)  No results found for: FERRITIN  Urinalysis No results found for: COLORURINE, APPEARANCEUR, LABSPEC, PHURINE, GLUCOSEU, HGBUR, BILIRUBINUR, KETONESUR, PROTEINUR, UROBILINOGEN, NITRITE, LEUKOCYTESUR   STUDIES: No results found.  ELIGIBLE FOR AVAILABLE RESEARCH PROTOCOL:no  ASSESSMENT: 30 y.o. BRCA1 positive [name] woman status post right breast overlapping sites biopsy 05/04/2019 for a clinical T3 N0, stage IIIB invasive ductal carcinoma, grade 2, triple negative, with an MIB-1 of 40%.  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  The Common Hereditary Gene Panel offered by [facility] includes sequencing and/or deletion duplication testing of the following 48 genes: APC, ATM, AXIN2, BARD1, BMPR1A, BRCA1, BRCA2, BRIP1, CDH1, CDK4, CDKN2A (p14ARF), CDKN2A (p16INK4a), CHEK2, CTNNA1, DICER1, EPCAM (Deletion/duplication testing only), GREM1 (promoter region deletion/duplication testing only), KIT, MEN1, MLH1, MSH2, MSH3, MSH6, MUTYH, NBN, NF1, NHTL1, PALB2, PDGFRA, PMS2, POLD1, POLE, PTEN, RAD50, RAD51C, RAD51D, RNF43, SDHB, SDHC, SDHD, SMAD4, SMARCA4. STK11, TP53, TSC1, TSC2, and VHL.   The following genes were evaluated for sequence changes only: SDHA and HOXB13 c.251G>A variant only. The report date is 05/24/2019.  (b) recommended bilateral mastectomies, due to potential barriers/risks with age and intensified screening  (2) neoadjuvant chemotherapy consisting of doxorubicin and cyclophosphamide in dose dense fashion x4 started 05/26/2019, completed 07/07/2019, followed by paclitaxel and carboplatin weekly x12 started 07/21/2019  (a) breast MRI 09/26/2019 shows a complete radiologic response  (3) status post right lumpectomy 11/17/2019 showing a complete pathologic response [ypT0 ypN0]  (a) a total of 5 axillary lymph nodes were removed  (4) adjuvant radiation   Radiation Treatment Dates: 01/02/2020 through 02/15/2020 Site Technique Total Dose (Gy) Dose per Fx (Gy) Completed Fx  Beam Energies  Breast, Right: Breast_Rt 3D 50.4/50.4 1.8 28/28 6X, 10X  Breast, Right: Breast_Rt_Bst 3D 10/10 2 5/5 6X  Sclav-RT: SCV_Rt 3D 50.4/50.4 1.8 28/28 6X, 10X    (5) considering bilateral salpingo-oophorectomy for ovarian cancer prevention  (a) receiving leuprolide/Lupron every 28 days   PLAN: Magdalen is now a little over a year out from definitive surgery for her breast cancer with no evidence of disease recurrence.  This is very favorable.  We are doing intensified screening.  She had an MRI of the breast in March.  We will repeat mammography in September and then another breast MRI March 2023.  I again discussed the possibility of bilateral mastectomies with reconstruction with her.  She is very reluctant to undergo this but more importantly she does not seem to understand the meaning of the mutation she carries.  I have tried to be as simple as possible explaining that this change in her blood increases the risk of developing another breast cancer in the future.  Even though she just lost an aunt to metastatic cancer nevertheless at this point she prefers to continue with intensified  screening and of course alone as we are able to do that despite the present barriers there is no survival disadvantage.  She will return to see [place] in 6 months.  We are continuing the monthly leuprilide as before  Total encounter time 25 minutes.[name] Jews C. Diora Bellizzi, Owens 01/30/21 5:53 PM Medical Oncology and Hematology [facility] [place], [place] [address] Tel. [phone]    Fax. [phone]   I, Wilburn Mylar, am acting as scribe for Dr. Virgie Owens. Eyal Greenhaw.  I, Lurline Del Owens, have reviewed the above documentation for accuracy and completeness, and I agree with the above.   *Total Encounter Time as defined by the Centers for Medicare and Medicaid Services includes, in addition to the face-to-face time of a patient visit (documented in the note above) non-face-to-face time: obtaining and reviewing outside history, ordering and reviewing medications, tests or procedures, care coordination (communications with other health care professionals or caregivers) and documentation in the medical record.

## 2021-02-27 ENCOUNTER — Telehealth: Payer: Self-pay | Admitting: *Deleted

## 2021-02-27 ENCOUNTER — Other Ambulatory Visit: Payer: Self-pay

## 2021-02-27 ENCOUNTER — Inpatient Hospital Stay: Payer: Self-pay | Attending: Oncology

## 2021-02-27 VITALS — BP 119/83 | HR 78 | Temp 98.6°F | Resp 16

## 2021-02-27 DIAGNOSIS — C50811 Malignant neoplasm of overlapping sites of right female breast: Secondary | ICD-10-CM | POA: Insufficient documentation

## 2021-02-27 DIAGNOSIS — Z171 Estrogen receptor negative status [ER-]: Secondary | ICD-10-CM | POA: Insufficient documentation

## 2021-02-27 DIAGNOSIS — Z01419 Encounter for gynecological examination (general) (routine) without abnormal findings: Secondary | ICD-10-CM

## 2021-02-27 DIAGNOSIS — Z95828 Presence of other vascular implants and grafts: Secondary | ICD-10-CM

## 2021-02-27 MED ORDER — LEUPROLIDE ACETATE 3.75 MG IM KIT
3.7500 mg | PACK | Freq: Once | INTRAMUSCULAR | Status: AC
  Administered 2021-02-27: 3.75 mg via INTRAMUSCULAR
  Filled 2021-02-27: qty 3.75

## 2021-02-27 NOTE — Telephone Encounter (Signed)
Per spanish interpreter liaison - [name]- pt is noted to be scheduled for MRI on 03/13/2021 with pt stating she is needing a mammogram not MRI.  [name] is assisting with the completion of mammogram scholarship per the [facility] program.  Per order review noted order is for mammogram this month with MRI ordered with expected date of March 2023.  Informed [name] of above and that the [facility] will be notified to reschedule patient appropriately.  This RN contacted the [facility] and per scheduler- she will contact the patient to get the mammogram scheduled  She transferred this RN to the MRI scheduler- discussed need to reschedule the MRI to March 2023.  Note this RN informed both scheduling staff of need for communication in [place] per pt's communication needs.

## 2021-02-27 NOTE — Patient Instructions (Signed)
Leuprolide injection Qu es este medicamento? La LEUPROLIDA (leuprorelina) es una hormona artificial. [place] para tratar los sntomas del cncer de prstata. Este medicamento tambin puede usarse para tratar a nios con inicio temprano de la pubertad. Se puede usar para otras afecciones hormonales. Este medicamento puede ser utilizado para otros usos; si tiene alguna pregunta consulte con su proveedor de atencin mdica o con su farmacutico. MARCAS COMUNES: Lupron Qu le debo informar a mi profesional de la salud antes de tomar este medicamento? Necesitan saber si usted presenta alguno de los siguientes problemas o situaciones: diabetes enfermedad cardiaca o ataque cardiaco previo presin sangunea alta nivel de colesterol alto dolor o dificultad para orinar metstasis en la mdula espinal accidente cerebrovascular fuma tabaco una reaccin alrgica o inusual a la leuprolida (leuprorelina), al alcohol benclico, a otros medicamentos, alimentos, colorantes o conservantes si est embarazada o buscando quedar embarazada si est amamantando a un beb Cmo debo utilizar este medicamento? Este medicamento se administra mediante inyeccin debajo de la piel o en un msculo. Le ensearn cmo preparar y Biomedical engineer. Use el medicamento exactamente como se le indique. Use su medicamento a intervalos regulares. No use su medicamento con una frecuencia mayor a la indicada. Es importante que deseche las agujas y las jeringas usadas en un recipiente resistente a los pinchazos. No las deseche en la basura. Si no tiene un recipiente resistente a los pinchazos, llame a su farmacutico o proveedor de atencin de la salud para obtenerlo. Su farmacutico le dar una Gua del medicamento especial (MedGuide, nombre en ingls) con cada receta y en cada ocasin que la vuelva a surtir. Asegrese de leer esta informacin cada vez cuidadosamente. Hable con su pediatra para informarse acerca del uso de este  medicamento en nios. Aunque este medicamento se puede recetar a nios tan pequeos como de 8 aos de edad con ciertas afecciones, existen precauciones que deben tomarse. Sobredosis: Pngase en contacto inmediatamente con un centro toxicolgico o una sala de urgencia si usted cree que haya tomado demasiado medicamento. ATENCIN: [employer] es solo para usted. No comparta este medicamento con nadie. Qu sucede si me olvido de una dosis? Si olvida una dosis, adminstrela lo antes posible. Si es casi la hora de la prxima dosis, administre solo esa dosis. No se administre dosis adicionales o dobles. Qu puede interactuar con este medicamento? No use este medicamento con ninguno de los siguientes frmacos: sauzgatillo cisaprida dronedarona pimozida tioridazina Este medicamento tambin puede interactuar con los siguientes frmacos: suplementos dietticos o de hierbas, tales como cohosh negro o DHEA hormonas femeninas, tales como estrgenos o progestinas, y pldoras, parches, anillos o inyecciones anticonceptivos hormonas masculinas, tales como testosterona otros medicamentos que prolongan el intervalo QT (ritmo cardiaco anormal) Puede ser que esta lista no menciona todas las posibles interacciones. Informe a su profesional de KB Home	[place] de [employer] productos a base de hierbas, medicamentos de [place] o suplementos nutritivos que est tomando. Si usted fuma, consume bebidas alcohlicas o si utiliza drogas ilegales, indqueselo tambin a su profesional de KB Home	[place]. Algunas sustancias pueden interactuar con su medicamento. A qu debo estar atento al usar [employer]? Visite a su mdico o a su profesional de la salud para que revise regularmente su evolucin. Durante la primera semana, sus sntomas podran empeorar, pero luego mejorarn a medida que contine su tratamiento. Es posible que tenga sofocos, dolor seo ms intenso, mayor dificultad para Garment/textile technologist o un agravamiento de los sntomas  neurolgicos. [employer] con [place]  mdico o con su profesional de KB Home	[place]. Algunos pueden mejorar con el uso continuo de [place]. Las mujeres pueden tener un ciclo menstrual o [employer] durante los primeros 2 meses de terapia con este medicamento. Si esto contina, contacte a su mdico o profesional de KB Home	[place]. Este medicamento puede aumentar los niveles de Dispensing optician. Pregntele a su profesional de la salud si es Chartered loss adjuster cambios en la dieta o en los medicamentos si usted tiene diabetes. Qu efectos secundarios puedo tener al [employer] este medicamento? Efectos secundarios que debe informar a su mdico o a Barrister's clerk de la salud tan pronto como sea posible: Chief of Staff, tales como erupcin cutnea, comezn/picazn o urticaria, e hinchazn de la cara, los labios o la lengua problemas para Contractor en el pecho depresin o trastornos de Programmer, applications en las piernas o la entrepierna Management consultant de la inyeccin dolor de cabeza grave signos y sntomas de niveles altos de Location manager en la sangre, tales como tener ms sed o apetito, o tener que orinar con mayor frecuencia que lo habitual. Tambin puede sentirse muy cansado o tener visin borrosa hinchazn de los pies y las piernas cambios en la visin vmito Efectos secundarios que generalmente no requieren atencin mdica (infrmelos a su mdico o a su profesional de la salud si persisten o si son molestos): sensibilidad o hinchazn de las mamas disminucin del deseo o desempeo sexual diarrea sofocos prdida del apetito Patent examiner, articulaciones o huesos nuseas enrojecimiento o Actor de la inyeccin problemas de piel o acn Puede ser que esta lista no menciona todos los posibles efectos secundarios. Comunquese a su mdico por asesoramiento mdico [employer]. Usted puede informar los efectos secundarios a la FDA por telfono al  1-800-FDA-1088. Dnde debo guardar mi medicina? Mantenga fuera del alcance de los nios. Guarde a una temperatura inferior a 25 grados Celsius (77 grados Fahrenheit). No congele. Proteja de Naval architect. No lo use si no est transparente o si tiene partculas. Deseche todo el medicamento que no haya utilizado despus de la fecha de vencimiento. ATENCIN: Este folleto es un resumen. Puede ser que no cubra toda la posible informacin. Si usted tiene preguntas acerca de esta medicina, consulte con su mdico, su farmacutico o su profesional de Technical sales engineer.  2022 Elsevier/Gold Standard (2019-10-06 00:00:00)

## 2021-03-01 ENCOUNTER — Encounter: Payer: Self-pay | Admitting: Oncology

## 2021-03-13 ENCOUNTER — Other Ambulatory Visit: Payer: Self-pay

## 2021-03-27 ENCOUNTER — Inpatient Hospital Stay: Payer: Self-pay | Attending: Oncology

## 2021-03-27 ENCOUNTER — Other Ambulatory Visit: Payer: Self-pay

## 2021-03-27 VITALS — BP 128/79 | HR 92 | Temp 97.9°F | Resp 16

## 2021-03-27 DIAGNOSIS — Z95828 Presence of other vascular implants and grafts: Secondary | ICD-10-CM

## 2021-03-27 DIAGNOSIS — Z9221 Personal history of antineoplastic chemotherapy: Secondary | ICD-10-CM | POA: Insufficient documentation

## 2021-03-27 DIAGNOSIS — Z923 Personal history of irradiation: Secondary | ICD-10-CM | POA: Insufficient documentation

## 2021-03-27 DIAGNOSIS — Z01419 Encounter for gynecological examination (general) (routine) without abnormal findings: Secondary | ICD-10-CM

## 2021-03-27 DIAGNOSIS — C50811 Malignant neoplasm of overlapping sites of right female breast: Secondary | ICD-10-CM | POA: Insufficient documentation

## 2021-03-27 DIAGNOSIS — Z171 Estrogen receptor negative status [ER-]: Secondary | ICD-10-CM

## 2021-03-27 MED ORDER — LEUPROLIDE ACETATE 3.75 MG IM KIT
3.7500 mg | PACK | Freq: Once | INTRAMUSCULAR | Status: AC
  Administered 2021-03-27: 3.75 mg via INTRAMUSCULAR
  Filled 2021-03-27: qty 3.75

## 2021-04-09 ENCOUNTER — Other Ambulatory Visit: Payer: Self-pay

## 2021-04-09 ENCOUNTER — Ambulatory Visit: Payer: Self-pay | Admitting: *Deleted

## 2021-04-09 ENCOUNTER — Encounter (INDEPENDENT_AMBULATORY_CARE_PROVIDER_SITE_OTHER): Payer: Self-pay

## 2021-04-09 ENCOUNTER — Ambulatory Visit
Admission: RE | Admit: 2021-04-09 | Discharge: 2021-04-09 | Disposition: A | Discharge status: Home / Self Care | Payer: No Typology Code available for payment source | Source: Ambulatory Visit | Attending: Oncology | Admitting: Oncology

## 2021-04-09 ENCOUNTER — Encounter: Payer: Self-pay | Admitting: Oncology

## 2021-04-09 VITALS — BP 122/94 | Wt 170.5 lb

## 2021-04-09 DIAGNOSIS — C50811 Malignant neoplasm of overlapping sites of right female breast: Secondary | ICD-10-CM

## 2021-04-09 DIAGNOSIS — Z1501 Genetic susceptibility to malignant neoplasm of breast: Secondary | ICD-10-CM

## 2021-04-09 DIAGNOSIS — Z1239 Encounter for other screening for malignant neoplasm of breast: Secondary | ICD-10-CM

## 2021-04-09 IMAGING — MG DIGITAL DIAGNOSTIC BILAT W/ TOMO W/ CAD
8 of 13 series · 8 of 37 positions shown · non-contrast
Comparison: Previous exam(s).

CLINICAL DATA: History of RIGHT breast cancer in [8V] status post
lumpectomy. [8V] gene mutation.

EXAM:
DIGITAL DIAGNOSTIC BILATERAL MAMMOGRAM WITH TOMOSYNTHESIS AND CAD
TECHNIQUE: Bilateral digital diagnostic mammography and breast tomosynthesis
was performed. The images were evaluated with computer-aided
detection.

[R CC]
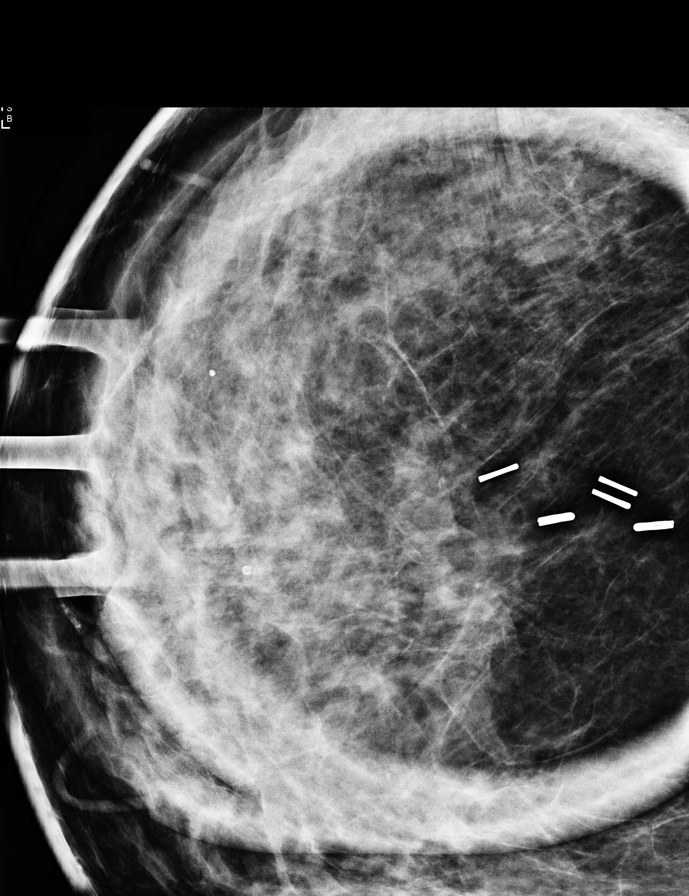

[R MLO synth-2D]
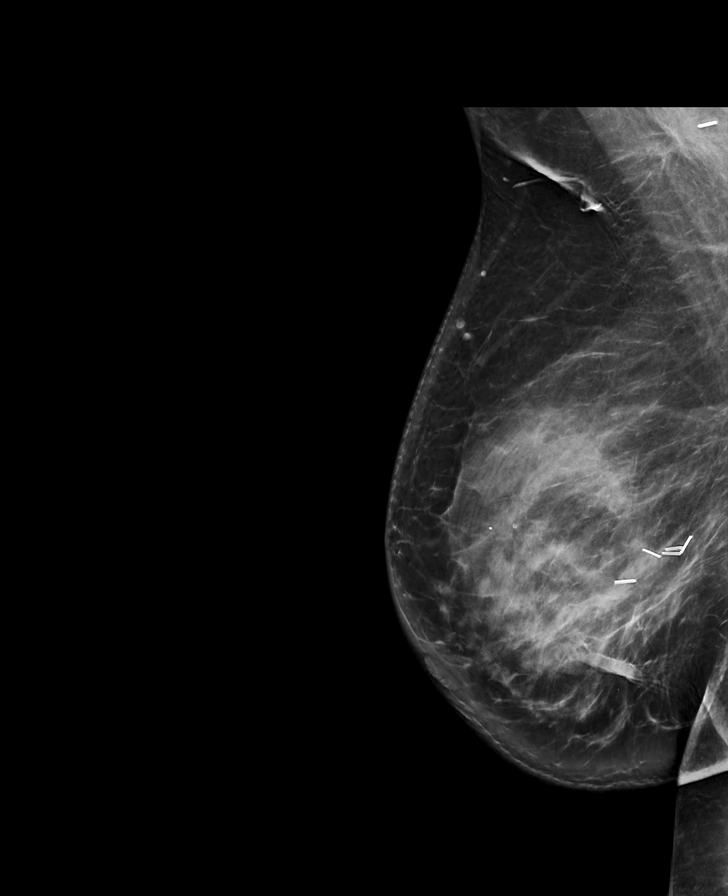

[L CC synth-2D]
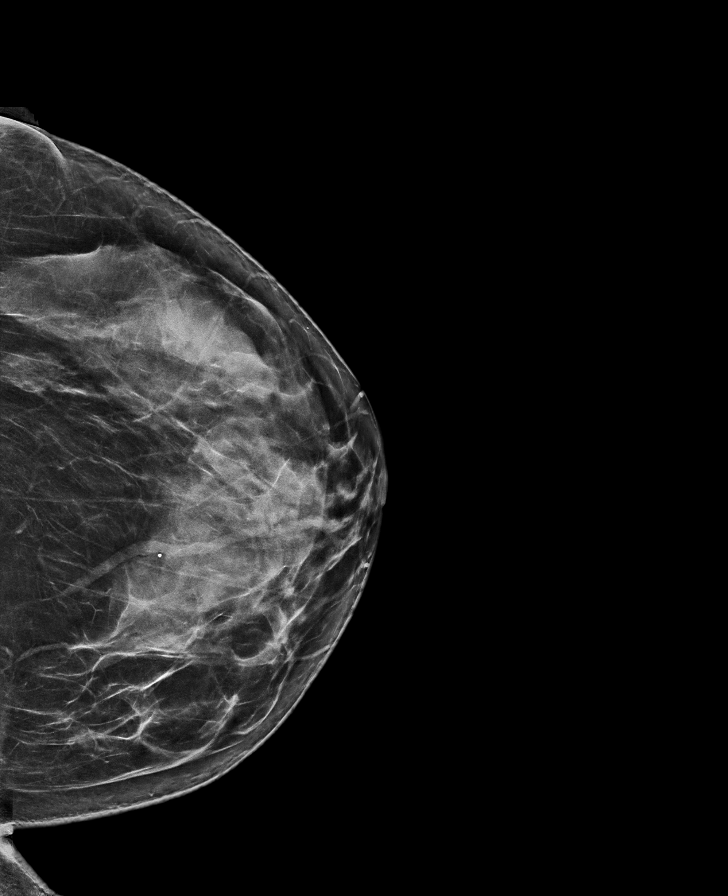

[R CC synth-2D (1 of 2)]
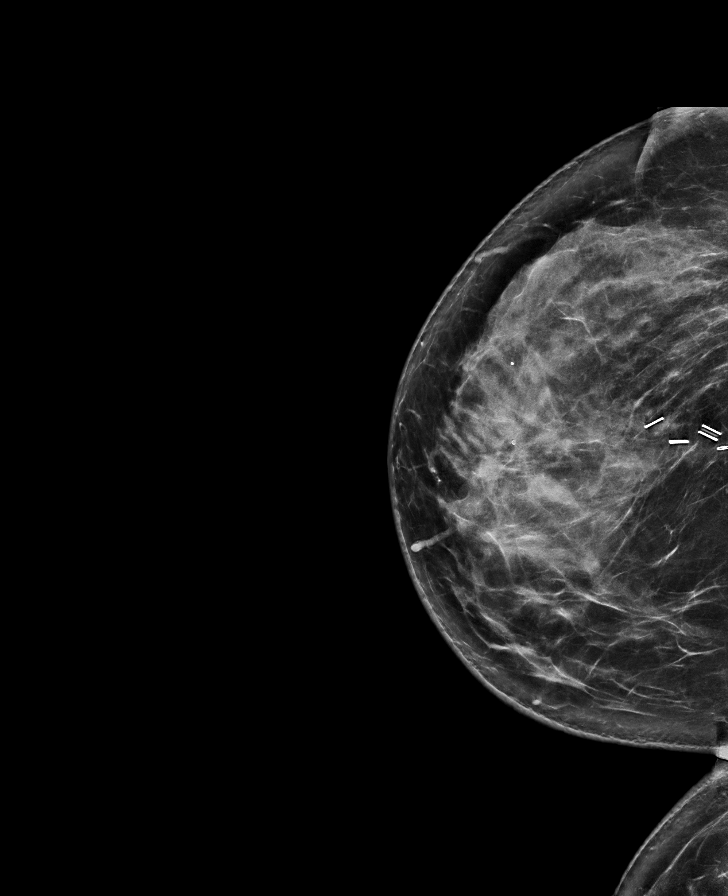

[R CC synth-2D (2 of 2)]
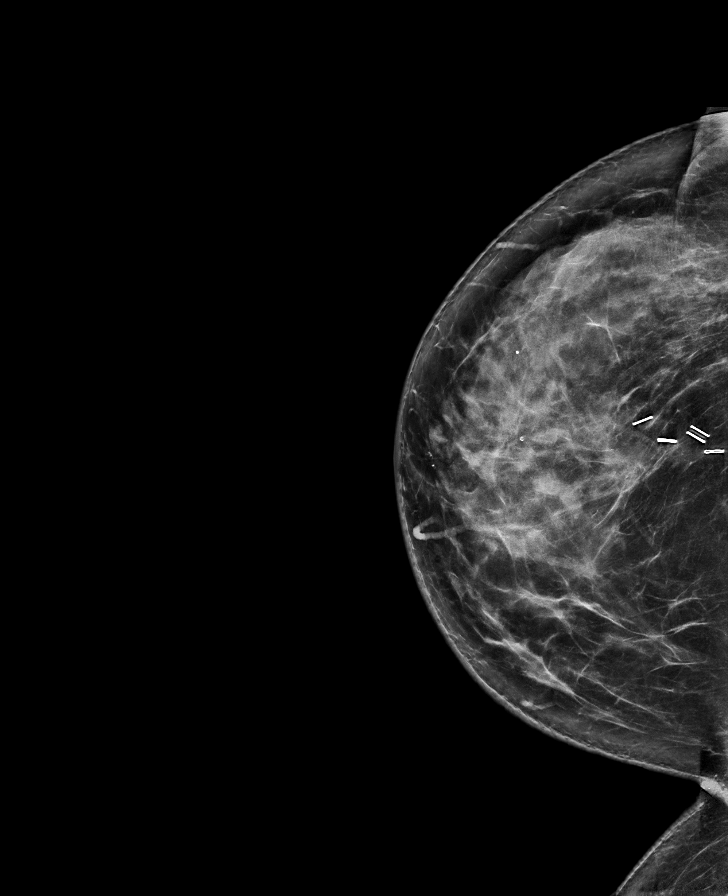

[L MLO synth-2D (1 of 2)]
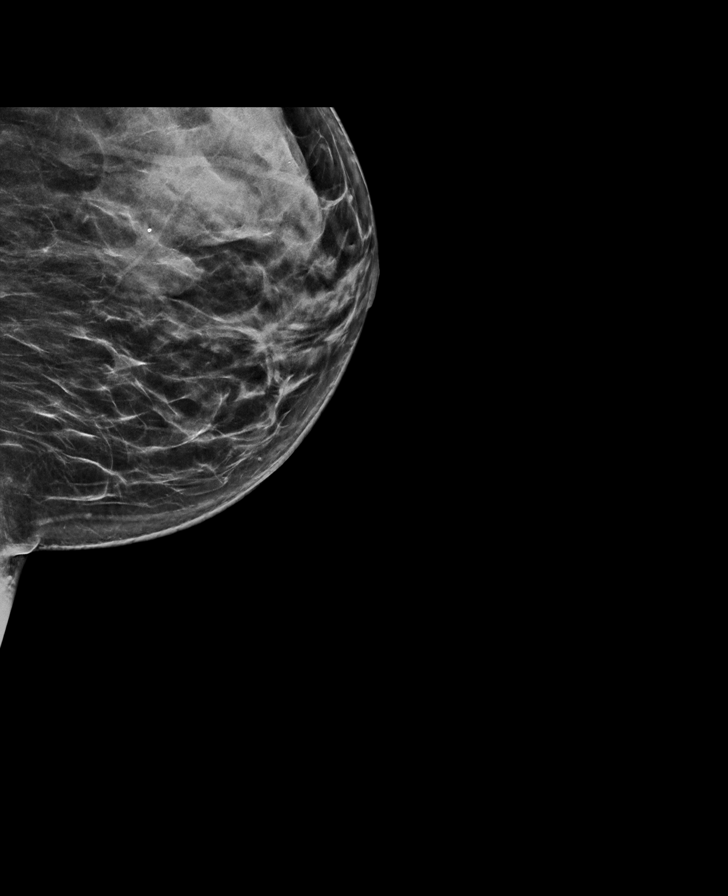

[L MLO synth-2D (2 of 2)]
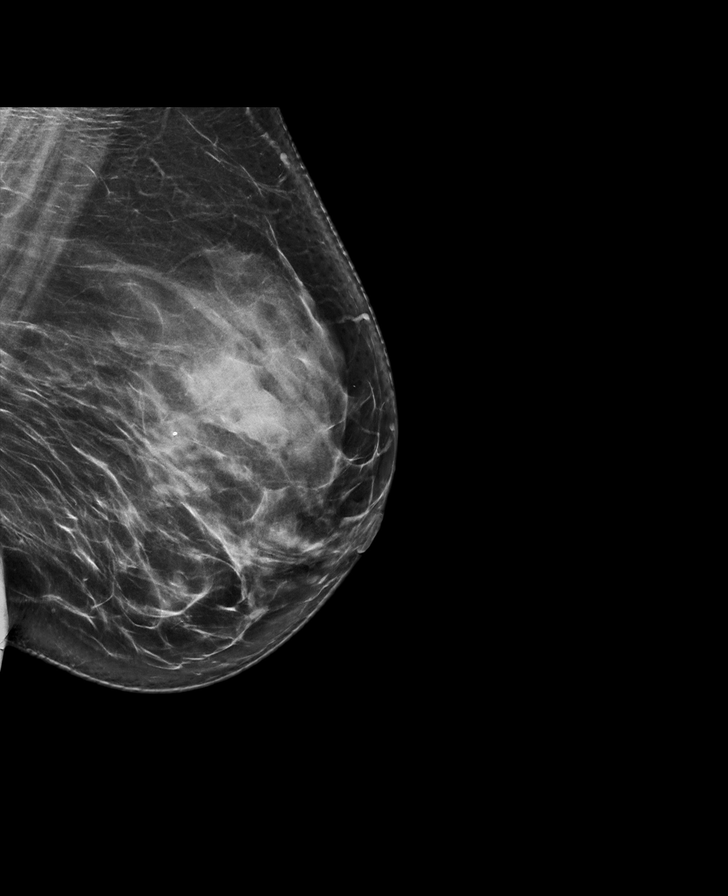

[R CC tomo · tomo slice 41/81.0]
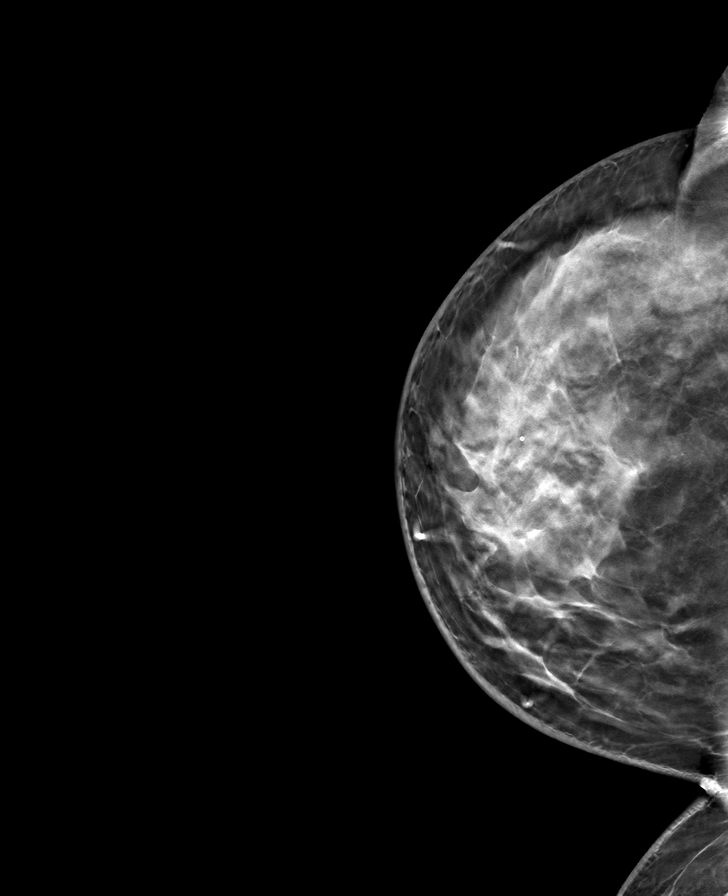

[8 of 37 positions shown; findings below may reference images not displayed]

ACR Breast Density Category c: The breast tissue is heterogeneously
dense, which may obscure small masses.
FINDINGS: There are stable postsurgical changes within the RIGHT breast. There
are no new dominant masses, suspicious calcifications or secondary
signs of malignancy within either breast.
IMPRESSION: No evidence of malignancy within either breast. Stable postsurgical
changes within the RIGHT breast.

RECOMMENDATION:
1. Bilateral diagnostic mammogram in 1 year.
2. Given the [8V] gene mutation, per American cancer sided
guidelines, annual screening breast MRIs are also recommended in
addition to mammography. Next annual screening breast MRI will be
due in [DATE].

I have discussed the findings and recommendations with the patient
with the aid of an interpreter. If applicable, a reminder letter
will be sent to the patient regarding the next appointment.

BI-RADS CATEGORY  2: Benign.

## 2021-04-09 NOTE — Patient Instructions (Signed)
Explained breast self awareness with Nevelyn Wynn Banker. Patient did not need a Pap smear today due to last Pap smear was 04/26/2019. Let her know [facility] will cover Pap smears every 3 years unless has a history of abnormal Pap smears. Referred patient to the [facility] for a diagnostic mammogram per Dr. Jana Hakim. Appointment scheduled Tuesday, April 09, 2021 at 1450. Patient aware of appointment and will be there. [place] verbalized understanding.  Everado Pillsbury, Arvil [place], RN 1:40 PM

## 2021-04-09 NOTE — Progress Notes (Signed)
[name] is a 30 y.o. female who presents to [facility] clinic today with no complaints. Patient is a current BCCCP patient that expires 05/29/2021 that was referred to [facility] by Dr. Jana Hakim at [facility]. Patient has a history of right breast invasive ductal carcinoma diagnosed 05/04/2019. Patient has undergone neoadjuvant chemotherapy, right breast lumpectomy 11/17/2019, and adjuvant radiation.    Pap Smear: Pap smear not completed today. Last Pap smear was 04/26/2019 at [facility] clinic and was normal. Per patient has no history of an abnormal Pap smear. Last Pap smear result is available in Epic.   Physical exam: Breasts Left breast slightly larger than left breast due to history of right breast lumpectomy 11/17/2019. No skin abnormalities left breast. Radiation changes throughout right breast and scar left lower breast observed on exam. No nipple retraction bilateral breasts. No nipple discharge bilateral breasts. No lymphadenopathy. No lumps palpated bilateral breasts. No complaints of pain or tenderness on exam.     MM Breast Surgical Specimen  Result Date: 11/17/2019 CLINICAL DATA:  Specimen radiograph status post right breast lumpectomy. EXAM: SPECIMEN RADIOGRAPH OF THE RIGHT BREAST COMPARISON:  Previous exam(s). FINDINGS: Status post excision of the right breast. The radioactive seed and biopsy marker clip are present, completely intact, and were marked for pathology. These findings were communicated with the OR at 8:10 a.m. IMPRESSION: Specimen radiograph of the right breast. Electronically Signed   By: Ammie Ferrier M.D.   On: 11/17/2019 08:12   MM RT RADIOACTIVE SEED LOC MAMMO GUIDE  Result Date: 11/16/2019 CLINICAL DATA:  30 year old female for radioactive seed localization of RIGHT breast cancer. EXAM: MAMMOGRAPHIC GUIDED RADIOACTIVE SEED LOCALIZATION OF THE RIGHT BREAST COMPARISON:  Previous exam(s). FINDINGS: Patient presents for radioactive seed localization  prior to RIGHT lumpectomy. I met with the patient and we discussed the procedure of seed localization including benefits and alternatives. We discussed the high likelihood of a successful procedure. We discussed the risks of the procedure including infection, bleeding, tissue injury and further surgery. We discussed the low dose of radioactivity involved in the procedure. Informed, written consent was given. The usual time-out protocol was performed immediately prior to the procedure. Using mammographic guidance, sterile technique, 1% lidocaine and an I-125 radioactive seed, the Q clip was localized using a LATERAL approach. The follow-up mammogram images confirm the seed in the expected location and were marked for Dr. Brantley Stage. Follow-up survey of the patient confirms presence of the radioactive seed. Order number of I-125 seed:  [id number]. Total activity:  7.989 millicuries.  Reference Date: 10/28/2019. The patient tolerated the procedure well and [facility] She was given instructions regarding seed removal. IMPRESSION: Radioactive seed localization RIGHT breast. No apparent complications. Electronically Signed   By: Margarette Canada M.D.   On: 11/16/2019 13:27   MS DIGITAL DIAG TOMO BILAT  Result Date: 05/31/2020 CLINICAL DATA:  History of RIGHT breast cancer 2020 status post breast conservation surgery. Per clinic note, positive for BRCA 1 mutation. EXAM: DIGITAL DIAGNOSTIC BILATERAL MAMMOGRAM WITH TOMO AND CAD COMPARISON:  Previous exam(s). ACR Breast Density Category c: The breast tissue is heterogeneously dense, which may obscure small masses. FINDINGS: There are expected postsurgical changes within the RIGHT breast. There are no new dominant masses, suspicious calcifications or secondary signs of malignancy within either breast. Mammographic images were processed with CAD. IMPRESSION: No evidence of malignancy within either breast. Expected postsurgical changes within the RIGHT  breast. RECOMMENDATION: 1. Bilateral diagnostic mammogram in 1 year.  2. Given known BRCA 1 mutation, recommend annual screening breast MRI. I have discussed the findings and recommendations with the patient. If applicable, a reminder letter will be sent to the patient regarding the next appointment. BI-RADS CATEGORY  2: Benign. Electronically Signed   By: Franki Cabot M.D.   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 An abnormal lymph node in the right axilla was biopsied and was negative for metastatic disease, however considered discordant given its abnormal appearance. The patient returns today for bilateral diagnostic mammography as this was not initially performed due to her young age. EXAM: DIGITAL DIAGNOSTIC BILATERAL MAMMOGRAM WITH CAD AND TOMO COMPARISON:  Prior right breast ultrasounds dated 04/27/2019 and 05/04/2019. ACR Breast Density Category d: The breast tissue is extremely dense, which lowers the sensitivity of mammography. FINDINGS: Large round mass within the central to slightly inner right breast containing a Q shaped biopsy marking clip compatible with biopsy proven malignancy measures 6.1 cm. No definite additional masses identified. No calcifications seen in the right breast. An abnormal/enlarged lymph node containing a HydroMARK clip is partially visualized within the right axilla. No suspicious masses or calcifications are identified in the left breast. Mammographic images were processed with CAD. IMPRESSION: 1. Biopsy proven malignancy in the right breast measures 6.1 cm mammographically with the previously biopsied morphologically abnormal lymph node partially visualized in the right axilla. No additional masses or calcifications seen in the right  breast. 2.  No mammographic evidence of malignancy in the left breast. RECOMMENDATION: 1.  Treatment plan for known right breast malignancy. 2. Given extremely dense fibroglandular tissue, consider contrast enhanced breast MRI. The findings and recommendations were discussed with the patient via [place] interpreter, Santa Lighter. The patient is aware of her biopsy results (biopsy performed yesterday) and was informed of her appointment at [facility] surgery scheduled for tomorrow. I have discussed the findings and recommendations with the patient. If applicable, a reminder letter will be sent to the patient regarding the next appointment. BI-RADS CATEGORY  6: Known biopsy-proven malignancy. Electronically Signed   By: Everlean Alstrom M.D.   On: 05/05/2019 14:09     Pelvic/Bimanual Pap is not indicated today per BCCCP guidelines.   Smoking History: Patient has never smoked.   Patient Navigation: Patient education provided. Access to services provided for patient through [place] program. Spanish interpreter Rudene Anda from [facility] provided.   Breast and Cervical Cancer Risk Assessment: Patient has a family history of her mother and a maternal aunt having breast cancer. Patient has personal history of breast cancer. Patient is positive for the BRCA1 mutation. Patient has history of right breast radiation treatment to the chest before age 74. Patient has no history of cervical dysplasia, immunocompromised, or DES exposure in-utero. Breast cancer risk assessment completed. No breast cancer risk calculated due to patient is less than 69 years old.  Risk Assessment     Risk Scores       04/09/2021 05/29/2020   Last edited by: Royston Bake, CMA Royston Bake, CMA   5-year risk:     Lifetime risk:              A: BCCCP exam without pap smear No complaints.  P: Referred patient to the [place] for a diagnostic  mammogram per Dr. Jana Hakim. Appointment  scheduled Tuesday, April 09, 2021 at 1450.  Loletta Parish, RN 04/09/2021 1:40 PM

## 2021-04-24 ENCOUNTER — Other Ambulatory Visit: Payer: Self-pay

## 2021-04-24 ENCOUNTER — Inpatient Hospital Stay: Payer: Self-pay | Attending: Oncology

## 2021-04-24 VITALS — BP 140/86 | HR 103 | Temp 97.9°F | Resp 17

## 2021-04-24 DIAGNOSIS — Z01419 Encounter for gynecological examination (general) (routine) without abnormal findings: Secondary | ICD-10-CM

## 2021-04-24 DIAGNOSIS — Z171 Estrogen receptor negative status [ER-]: Secondary | ICD-10-CM | POA: Insufficient documentation

## 2021-04-24 DIAGNOSIS — Z95828 Presence of other vascular implants and grafts: Secondary | ICD-10-CM

## 2021-04-24 DIAGNOSIS — C50811 Malignant neoplasm of overlapping sites of right female breast: Secondary | ICD-10-CM | POA: Insufficient documentation

## 2021-04-24 MED ORDER — LEUPROLIDE ACETATE 3.75 MG IM KIT
3.7500 mg | PACK | Freq: Once | INTRAMUSCULAR | Status: AC
  Administered 2021-04-24: 3.75 mg via INTRAMUSCULAR
  Filled 2021-04-24: qty 3.75

## 2021-05-22 ENCOUNTER — Inpatient Hospital Stay: Payer: No Typology Code available for payment source | Attending: Oncology

## 2021-05-22 ENCOUNTER — Other Ambulatory Visit: Payer: Self-pay

## 2021-05-22 VITALS — BP 117/82 | HR 82 | Temp 98.7°F | Resp 18

## 2021-05-22 DIAGNOSIS — Z01419 Encounter for gynecological examination (general) (routine) without abnormal findings: Secondary | ICD-10-CM

## 2021-05-22 DIAGNOSIS — Z171 Estrogen receptor negative status [ER-]: Secondary | ICD-10-CM | POA: Insufficient documentation

## 2021-05-22 DIAGNOSIS — Z95828 Presence of other vascular implants and grafts: Secondary | ICD-10-CM

## 2021-05-22 DIAGNOSIS — C50811 Malignant neoplasm of overlapping sites of right female breast: Secondary | ICD-10-CM | POA: Insufficient documentation

## 2021-05-22 MED ORDER — LEUPROLIDE ACETATE 3.75 MG IM KIT
3.7500 mg | PACK | Freq: Once | INTRAMUSCULAR | Status: AC
  Administered 2021-05-22: 3.75 mg via INTRAMUSCULAR
  Filled 2021-05-22: qty 3.75

## 2021-05-30 ENCOUNTER — Ambulatory Visit: Payer: Self-pay

## 2021-06-06 ENCOUNTER — Other Ambulatory Visit (HOSPITAL_COMMUNITY): Payer: Self-pay

## 2021-06-19 ENCOUNTER — Other Ambulatory Visit: Payer: Self-pay

## 2021-06-19 ENCOUNTER — Inpatient Hospital Stay: Payer: Self-pay | Attending: Oncology

## 2021-06-19 VITALS — BP 129/81 | HR 94 | Temp 98.4°F | Resp 18

## 2021-06-19 DIAGNOSIS — Z171 Estrogen receptor negative status [ER-]: Secondary | ICD-10-CM

## 2021-06-19 DIAGNOSIS — Z01419 Encounter for gynecological examination (general) (routine) without abnormal findings: Secondary | ICD-10-CM

## 2021-06-19 DIAGNOSIS — C50811 Malignant neoplasm of overlapping sites of right female breast: Secondary | ICD-10-CM | POA: Insufficient documentation

## 2021-06-19 DIAGNOSIS — Z95828 Presence of other vascular implants and grafts: Secondary | ICD-10-CM

## 2021-06-19 MED ORDER — LEUPROLIDE ACETATE 3.75 MG IM KIT
3.7500 mg | PACK | Freq: Once | INTRAMUSCULAR | Status: AC
  Administered 2021-06-19: 3.75 mg via INTRAMUSCULAR
  Filled 2021-06-19: qty 3.75

## 2021-07-17 ENCOUNTER — Other Ambulatory Visit: Payer: Self-pay

## 2021-07-17 ENCOUNTER — Inpatient Hospital Stay: Payer: Self-pay

## 2021-07-17 ENCOUNTER — Inpatient Hospital Stay: Payer: Self-pay | Attending: Oncology

## 2021-07-17 ENCOUNTER — Encounter: Payer: Self-pay | Admitting: Hematology and Oncology

## 2021-07-17 ENCOUNTER — Inpatient Hospital Stay (HOSPITAL_BASED_OUTPATIENT_CLINIC_OR_DEPARTMENT_OTHER): Payer: Self-pay | Admitting: Hematology and Oncology

## 2021-07-17 VITALS — BP 136/75 | HR 85 | Temp 97.7°F | Resp 16 | Ht 61.0 in | Wt 174.9 lb

## 2021-07-17 DIAGNOSIS — Z1501 Genetic susceptibility to malignant neoplasm of breast: Secondary | ICD-10-CM

## 2021-07-17 DIAGNOSIS — Z803 Family history of malignant neoplasm of breast: Secondary | ICD-10-CM | POA: Insufficient documentation

## 2021-07-17 DIAGNOSIS — Z171 Estrogen receptor negative status [ER-]: Secondary | ICD-10-CM

## 2021-07-17 DIAGNOSIS — C50811 Malignant neoplasm of overlapping sites of right female breast: Secondary | ICD-10-CM

## 2021-07-17 DIAGNOSIS — Z1502 Genetic susceptibility to malignant neoplasm of ovary: Secondary | ICD-10-CM | POA: Insufficient documentation

## 2021-07-17 DIAGNOSIS — Z01419 Encounter for gynecological examination (general) (routine) without abnormal findings: Secondary | ICD-10-CM

## 2021-07-17 DIAGNOSIS — Z95828 Presence of other vascular implants and grafts: Secondary | ICD-10-CM

## 2021-07-17 DIAGNOSIS — Z79899 Other long term (current) drug therapy: Secondary | ICD-10-CM | POA: Insufficient documentation

## 2021-07-17 LAB — COMPREHENSIVE METABOLIC PANEL
ALT: 141 U/L — ABNORMAL HIGH (ref 0–44)
AST: 70 U/L — ABNORMAL HIGH (ref 15–41)
Albumin: 4.3 g/dL (ref 3.5–5.0)
Alkaline Phosphatase: 130 U/L — ABNORMAL HIGH (ref 38–126)
Anion gap: 10 (ref 5–15)
BUN: 11 mg/dL (ref 6–20)
CO2: 24 mmol/L (ref 22–32)
Calcium: 9.3 mg/dL (ref 8.9–10.3)
Chloride: 103 mmol/L (ref 98–111)
Creatinine, Ser: 0.47 mg/dL (ref 0.44–1.00)
GFR, Estimated: >60 mL/min (ref >60)
Glucose, Bld: 102 mg/dL — ABNORMAL HIGH (ref 70–99)
Potassium: 3.8 mmol/L (ref 3.5–5.1)
Sodium: 137 mmol/L (ref 135–145)
Total Bilirubin: 0.3 mg/dL (ref 0.3–1.2)
Total Protein: 8.3 g/dL — ABNORMAL HIGH (ref 6.5–8.1)

## 2021-07-17 LAB — CBC WITH DIFFERENTIAL/PLATELET
Abs Immature Granulocytes: 0.02 10*3/uL (ref 0.00–0.07)
Basophils Absolute: 0 10*3/uL (ref 0.0–0.1)
Basophils Relative: 1 %
Eosinophils Absolute: 0.3 10*3/uL (ref 0.0–0.5)
Eosinophils Relative: 4 %
HCT: 35.6 % — ABNORMAL LOW (ref 36.0–46.0)
Hemoglobin: 12.3 g/dL (ref 12.0–15.0)
Immature Granulocytes: 0 %
Lymphocytes Relative: 37 %
Lymphs Abs: 3.3 10*3/uL (ref 0.7–4.0)
MCH: 30.8 pg (ref 26.0–34.0)
MCHC: 34.6 g/dL (ref 30.0–36.0)
MCV: 89 fL (ref 80.0–100.0)
Monocytes Absolute: 0.6 10*3/uL (ref 0.1–1.0)
Monocytes Relative: 6 %
Neutro Abs: 4.6 10*3/uL (ref 1.7–7.7)
Neutrophils Relative %: 52 %
Platelets: 250 10*3/uL (ref 150–400)
RBC: 4 MIL/uL (ref 3.87–5.11)
RDW: 11.9 % (ref 11.5–15.5)
WBC: 8.8 10*3/uL (ref 4.0–10.5)
nRBC: 0 % (ref 0.0–0.2)

## 2021-07-17 LAB — LIPID PANEL
Cholesterol: 213 mg/dL — ABNORMAL HIGH (ref 0–200)
HDL: 31 mg/dL — ABNORMAL LOW (ref >40)
LDL Cholesterol: 137 mg/dL — ABNORMAL HIGH (ref 0–99)
Total CHOL/HDL Ratio: 6.9 RATIO
Triglycerides: 225 mg/dL — ABNORMAL HIGH (ref <150)
VLDL: 45 mg/dL — ABNORMAL HIGH (ref 0–40)

## 2021-07-17 MED ORDER — LEUPROLIDE ACETATE 3.75 MG IM KIT
3.7500 mg | PACK | Freq: Once | INTRAMUSCULAR | Status: AC
  Administered 2021-07-17: 3.75 mg via INTRAMUSCULAR
  Filled 2021-07-17: qty 3.75

## 2021-07-17 NOTE — Patient Instructions (Signed)
Leuprolide injection What is this medication? LEUPROLIDE (loo PROE lide) is a man-made hormone. It is used to treat the symptoms of prostate cancer. This medicine may also be used to treat children with early onset of puberty. It may be used for other hormonal conditions. This medicine may be used for other purposes; ask your health care provider or pharmacist if you have questions. COMMON BRAND NAME(S): Lupron What should I tell my care team before I take this medication? They need to know if you have any of these conditions: diabetes heart disease or previous heart attack high blood pressure high cholesterol pain or difficulty passing urine spinal cord metastasis stroke tobacco smoker an unusual or allergic reaction to leuprolide, benzyl alcohol, other medicines, foods, dyes, or preservatives pregnant or trying to get pregnant breast-feeding How should I use this medication? This medicine is for injection under the skin or into a muscle. You will be taught how to prepare and give this medicine. Use exactly as directed. Take your medicine at regular intervals. Do not take your medicine more often than directed. It is important that you put your used needles and syringes in a special sharps container. Do not put them in a trash can. If you do not have a sharps container, call your pharmacist or healthcare provider to get one. A special MedGuide will be given to you by the pharmacist with each prescription and refill. Be sure to read this information carefully each time. Talk to your pediatrician regarding the use of this medicine in children. While this medicine may be prescribed for children as young as 8 years for selected conditions, precautions do apply. Overdosage: If you think you have taken too much of this medicine contact a poison control center or emergency room at once. NOTE: This medicine is only for you. Do not share this medicine with others. What if I miss a dose? If you miss  a dose, take it as soon as you can. If it is almost time for your next dose, take only that dose. Do not take double or extra doses. What may interact with this medication? Do not take this medicine with any of the following medications: chasteberry cisapride dronedarone pimozide thioridazine This medicine may also interact with the following medications: herbal or dietary supplements, like black cohosh or DHEA female hormones, like estrogens or progestins and birth control pills, patches, rings, or injections female hormones, like testosterone other medicines that prolong the QT interval (abnormal heart rhythm) This list may not describe all possible interactions. Give your health care provider a list of all the medicines, herbs, non-prescription drugs, or dietary supplements you use. Also tell them if you smoke, drink alcohol, or use illegal drugs. Some items may interact with your medicine. What should I watch for while using this medication? Visit your doctor or health care professional for regular checks on your progress. During the first week, your symptoms may get worse, but then will improve as you continue your treatment. You may get hot flashes, increased bone pain, increased difficulty passing urine, or an aggravation of nerve symptoms. Discuss these effects with your doctor or health care professional, some of them may improve with continued use of this medicine. Female patients may experience a menstrual cycle or spotting during the first 2 months of therapy with this medicine. If this continues, contact your doctor or health care professional. This medicine may increase blood sugar. Ask your healthcare provider if changes in diet or medicines are needed if you have  diabetes. What side effects may I notice from receiving this medication? Side effects that you should report to your doctor or health care professional as soon as possible: allergic reactions like skin rash, itching or  hives, swelling of the face, lips, or tongue breathing problems chest pain depression or memory disorders pain in your legs or groin pain at site where injected severe headache signs and symptoms of high blood sugar such as being more thirsty or hungry or having to urinate more than normal. You may also feel very tired or have blurry vision swelling of the feet and legs visual changes vomiting Side effects that usually do not require medical attention (report to your doctor or health care professional if they continue or are bothersome): breast swelling or tenderness decrease in sex drive or performance diarrhea hot flashes loss of appetite muscle, joint, or bone pains nausea redness or irritation at site where injected skin problems or acne This list may not describe all possible side effects. Call your doctor for medical advice about side effects. You may report side effects to FDA at 1-800-FDA-1088. Where should I keep my medication? Keep out of the reach of children. Store below 25 degrees C (77 degrees F). Do not freeze. Protect from light. Do not use if it is not clear or if there are particles present. Throw away any unused medicine after the expiration date. NOTE: This sheet is a summary. It may not cover all possible information. If you have questions about this medicine, talk to your doctor, pharmacist, or health care provider.  2022 Elsevier/Gold Standard (2021-02-19 00:00:00)

## 2021-07-17 NOTE — Progress Notes (Signed)
[place]  Telephone:([phone]) [phone] Fax:(336) [phone]     ID: Brianna Owens DOB: Feb 25, 1991  MR#: [id number]  QIH#:[id number]  Patient Care Team: Brianna Cruel, MD as PCP - General (Oncology) Owens, Brianna Dad, MD as Consulting Physician (Oncology) Brianna Luna, MD as Consulting Physician (General Surgery) Brianna Kaufmann, RN as Oncology Nurse Navigator Brianna Germany, RN as Oncology Nurse Navigator Brianna Gibson, MD as Attending Physician (Radiation Oncology) Brianna Pike, MD OTHER MD:  CHIEF COMPLAINT: Triple negative breast cancer, BRCA1 positive  CURRENT TREATMENT: continuing Luprolide monthly  INTERVAL HISTORY:  Certified Spanish interpreter was present during this conversation. Brianna Owens returns today for follow up of her triple negative breast cancer.   She receives Lupron monthly, prescribed to her by Dr. Jana Owens to reduce risk of ovarian cancer.  She has been tolerating this very well without any issues. Patient is wanting to have more kids at this time and is hesitant to consider surgery anytime soon.  She understands that because of her BRCA mutation, she is at very high risk of breast cancer.  She is also hoping to move to [place] sometime soon because her mom lives there.  In the interim she is willing to continue MRI and mammogram screening and Lupron. She mentions to me that her mom wanted her to get some scans because breast cancer runs in her family.  When asked about symptoms, she denies any new symptoms. Rest of the pertinent 10 point ROS reviewed and negative.   COVID 19 VACCINATION STATUS: Status post Pfizer x2 most recently August 2021    HISTORY OF CURRENT ILLNESS: From the original intake note:  Brianna Owens [facility] with a 3 month history of a right breast lump that became painful in early 04/2019. Physical exam performed at that time showed a  palpable, tender 13 cm lump within the right center breast under the nipple area. She underwent right breast ultrasonography at [facility] on 04/27/2019 showing: 6.1 cm palpable mass centered in the 5 o'clock position of the right breast; single right inferomedial axillary lymph node with focal cortical thickening inferiorly.  Accordingly on 05/04/2019 she proceeded to biopsy of the right breast area in question. The pathology from this procedure ([id number]) showed: invasive ductal carcinoma, grade 2-3. Prognostic indicators significant for: estrogen receptor, 0% negative and progesterone receptor, 0% negative. Proliferation marker Ki67 at 40%.  I do not find HER-2 receptor documentation  The right axillary lymph node biopsied at that time showed reactive germinal centers.  She underwent bilateral diagnostic mammography with tomography at [facility] on 05/05/2019 showing: breast density category D; biopsy-proven right breast malignancy measures 6.1 cm; no additional masses or calcifications seen in the right breast; no evidence of malignancy in the left breast.  The patient's subsequent history is as detailed below.   PAST MEDICAL HISTORY: Past Medical History:  Diagnosis Date   Breast cancer ([name])    Cancer ([name]) 05/05/2019   Personal history of chemotherapy    Personal history of radiation therapy     PAST SURGICAL HISTORY: Past Surgical History:  Procedure Laterality Date   BREAST BIOPSY     BREAST LUMPECTOMY     BREAST LUMPECTOMY WITH RADIOACTIVE SEED AND SENTINEL LYMPH NODE BIOPSY Right 11/17/2019   Procedure: RIGHT BREAST LUMPECTOMY WITH RADIOACTIVE SEED;  Surgeon: Brianna Luna, MD;  Location: [facility];  Service: General;  Laterality: Right;  PEC BLOCK   IR IMAGING  GUIDED PORT INSERTION  05/24/2019   IR REMOVAL TUN ACCESS W/ PORT W/O FL MOD SED  07/18/2020   SENTINEL NODE BIOPSY Right 11/17/2019   Procedure: Sentinel Node Biopsy;  Surgeon: Brianna Luna, MD;  Location:  [name];  Service: General;  Laterality: Right;    FAMILY HISTORY: Family History  Problem Relation Age of Onset   Breast cancer Brianna Owens    Breast cancer Brianna Owens   Patient's father is 69 and her Brianna Owens 1 as of November 2020.  The patient's Brianna Owens was diagnosed with breast cancer in her early 40s.  She lives in [place]. The patient's Brianna Owens's sister was also diagnosed with breast cancer in her early 75s.  The patient herself has 1 sister, no brothers.  She is not aware of any ovarian cancer cases in the family but tells me "all the women in my family had their ovaries removed".   GYNECOLOGIC HISTORY:  No LMP recorded. (Menstrual status: Irregular Periods). Menarche: 31 years old Age at first live birth: 31 years old [place] P 2 LMP regular Contraceptive HRT n/a  Hysterectomy? no BSO? no   SOCIAL HISTORY: (updated February 2022)  Brianna Owens normally works at [employer], Saturday and Sunday every other weekend.  She is originally from [place].  She is divorced.  Her children are Brianna Owens, 4 years old, living in [place] with his father, and Brianna Owens, 75 years old, who lives with the patient.     ADVANCED DIRECTIVES: Not in place   HEALTH MAINTENANCE: Social History   Tobacco Use   Smoking status: Never   Smokeless tobacco: Never  Vaping Use   Vaping Use: Never used  Substance Use Topics   Alcohol use: Not Currently   Drug use: Not Currently     Colonoscopy: n/a  PAP: 04/26/2019, negative  Bone density: n/a   No Known Allergies  Current Outpatient Medications  Medication Sig Dispense Refill   ibuprofen (ADVIL) 800 MG tablet Take 1 tablet (800 mg total) by mouth every 8 (eight) hours as needed. (Patient not taking: Reported on 04/09/2021) 30 tablet 0   methocarbamol (ROBAXIN) 500 MG tablet Take 1 tablet (500 mg total) by mouth 4 (four) times daily. (Patient not taking: Reported on 04/09/2021) 40 tablet 0   No current facility-administered medications for this visit.     OBJECTIVE:  Spanish speaker who appears stated age  31:   07/17/21 1448  BP: 136/75  Pulse: 85  Resp: 16  Temp: 97.7 F (36.5 C)  SpO2: 100%      Body mass index is 33.05 kg/m.   Wt Readings from Last 3 Encounters:  07/17/21 174 lb 14.4 oz (79.3 kg)  04/09/21 170 lb 8 oz (77.3 kg)  01/30/21 176 lb 12.8 oz (80.2 kg)      ECOG FS:1 - Symptomatic but completely ambulatory  Sclerae unicteric, EOMs intact Wearing a mask No cervical or supraclavicular adenopathy Lungs no rales or rhonchi Heart regular rate and rhythm Abd soft, obese, nontender, positive bowel sounds MSK no focal spinal tenderness, no upper extremity lymphedema Neuro: nonfocal, well oriented, appropriate affect. Breasts: Status post right lumpectomy and radiation, with no evidence of residual or recurrent disease.  Left breast is benign.  Both axillae are benign.  LAB RESULTS:  CMP     Component Value Date/Time   NA 137 07/18/2020 1215   K 5.4 (H) 07/18/2020 1215   CL 105 07/18/2020 1215   CO2 23 07/18/2020 1215   GLUCOSE 90 07/18/2020 1215  BUN 13 07/18/2020 1215   CREATININE 0.61 07/18/2020 1215   CREATININE 0.45 07/21/2019 1025   CALCIUM 9.4 07/18/2020 1215   PROT 8.3 (H) 03/16/2020 1011   ALBUMIN 4.0 03/16/2020 1011   AST 95 (H) 03/16/2020 1011   AST 29 07/21/2019 1025   ALT 193 (H) 03/16/2020 1011   ALT 58 (H) 07/21/2019 1025   ALKPHOS 131 (H) 03/16/2020 1011   BILITOT 0.4 03/16/2020 1011   BILITOT 0.3 07/21/2019 1025   GFRNONAA >60 07/18/2020 1215   GFRNONAA >60 07/21/2019 1025   GFRAA >60 03/16/2020 1011   GFRAA >60 07/21/2019 1025    No results found for: TOTALPROTELP, ALBUMINELP, A1GS, A2GS, BETS, BETA2SER, GAMS, MSPIKE, SPEI  No results found for: KPAFRELGTCHN, LAMBDASER, KAPLAMBRATIO  Lab Results  Component Value Date   WBC 8.8 07/17/2021   NEUTROABS 4.6 07/17/2021   HGB 12.3 07/17/2021   HCT 35.6 (L) 07/17/2021   MCV 89.0 07/17/2021   PLT 250 07/17/2021    No  results found for: LABCA2  No components found for: [id number]  No results for input(s): INR in the last 168 hours.  No results found for: LABCA2  No results found for: YNW295  No results found for: AOZ308  No results found for: MVH846  No results found for: CA2729  No components found for: HGQUANT  No results found for: CEA1 / No results found for: CEA1   No results found for: AFPTUMOR  No results found for: CHROMOGRNA  No results found for: HGBA, HGBA2QUANT, HGBFQUANT, HGBSQUAN (Hemoglobinopathy evaluation)   No results found for: LDH  No results found for: IRON, TIBC, IRONPCTSAT (Iron and TIBC)  No results found for: FERRITIN  Urinalysis No results found for: COLORURINE, APPEARANCEUR, LABSPEC, PHURINE, GLUCOSEU, HGBUR, BILIRUBINUR, KETONESUR, PROTEINUR, UROBILINOGEN, NITRITE, LEUKOCYTESUR   STUDIES: No results found.  ELIGIBLE FOR AVAILABLE RESEARCH PROTOCOL:no  ASSESSMENT: 31 y.o. BRCA1 positive Brianna Owens woman status post right breast overlapping sites biopsy 05/04/2019 for a clinical T3 N0, stage IIIB invasive ductal carcinoma, grade 2, triple negative, with an MIB-1 of 40%.  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  The Common Hereditary Gene Panel offered by [facility] includes sequencing and/or deletion duplication testing of the following 48 genes: APC, ATM, AXIN2, BARD1, BMPR1A, BRCA1, BRCA2, BRIP1, CDH1, CDK4, CDKN2A (p14ARF), CDKN2A (p16INK4a), CHEK2, CTNNA1, DICER1, EPCAM (Deletion/duplication testing only), GREM1 (promoter region deletion/duplication testing only), KIT, MEN1, MLH1, MSH2, MSH3, MSH6, MUTYH, NBN, NF1, NHTL1, PALB2, PDGFRA, PMS2, POLD1, POLE, PTEN, RAD50, RAD51C, RAD51D, RNF43, SDHB, SDHC, SDHD, SMAD4, SMARCA4. STK11, TP53, TSC1, TSC2, and VHL.  The  following genes were evaluated for sequence changes only: SDHA and HOXB13 c.251G>A variant only. The report date is 05/24/2019.  (b) recommended bilateral mastectomies, due to potential barriers/risks with age and intensified screening  (2) neoadjuvant chemotherapy consisting of doxorubicin and cyclophosphamide in dose dense fashion x4 started 05/26/2019, completed 07/07/2019, followed by paclitaxel and carboplatin weekly x12 started 07/21/2019  (a) breast MRI 09/26/2019 shows a complete radiologic response  (3) status post right lumpectomy 11/17/2019 showing a complete pathologic response [ypT0 ypN0]  (a) a total of 5 axillary lymph nodes were removed  (4) adjuvant radiation   Radiation Treatment Dates: 01/02/2020 through 02/15/2020 Site Technique Total Dose (Gy) Dose per Fx (Gy) Completed Fx Beam Energies  Breast, Right: Breast_Rt 3D 50.4/50.4 1.8 28/28 6X, 10X  Breast, Right: Breast_Rt_Bst 3D 10/10 2 5/5 6X  Sclav-RT: SCV_Rt 3D 50.4/50.4 1.8 28/28 6X, 10X    (5) considering bilateral salpingo-oophorectomy for ovarian cancer prevention  (a) receiving leuprolide/Lupron every 28 days   PLAN: Patient is here for follow-up on Lupron.  She has MRI scheduled on March 1.  We have discussed once again about the role of bilateral mastectomy and salpingo-oophorectomy if she is done with childbearing.  She tells me that she understands the risk of breast cancer and ovarian cancer with this gene however she would like to have more children hence she would like to postpone the surgery for the meantime.  In the interim she would want to continue the MRIs and mammograms and Lupron.  I have once again discussed that she may not be able to conceive on Lupron so if she decides to proceed with childbearing, she may have to give [place] a notice so we can discontinue Lupron.  She is also contemplating a move to [place].  I have strongly encouraged that she find an oncologist as soon as possible and preferably proceed  with MRI breast before she moves. With regards to scans, I explained to her that in absence of lack of symptoms, there is no role for routine scans.  She expressed understanding. Return to clinic to see me in 3 months if she continues to stay in [place].  Total encounter time 30 minutes   *Total Encounter Time as defined by the Centers for Medicare and Medicaid Services includes, in addition to the face-to-face time of a patient visit (documented in the note above) non-face-to-face time: obtaining and reviewing outside history, ordering and reviewing medications, tests or procedures, care coordination (communications with other health care professionals or caregivers) and documentation in the medical record.

## 2021-07-18 ENCOUNTER — Ambulatory Visit: Payer: Self-pay | Admitting: *Deleted

## 2021-07-18 VITALS — BP 140/98 | Wt 174.6 lb

## 2021-07-18 DIAGNOSIS — Z1239 Encounter for other screening for malignant neoplasm of breast: Secondary | ICD-10-CM

## 2021-07-18 LAB — THYROID PANEL WITH TSH
Free Thyroxine Index: 1.7 (ref 1.2–4.9)
T3 Uptake Ratio: 25 % (ref 24–39)
T4, Total: 6.7 ug/dL (ref 4.5–12.0)
TSH: 2.78 u[IU]/mL (ref 0.450–4.500)

## 2021-07-18 NOTE — Progress Notes (Signed)
[name] is a 31 y.o. G2P0 female who presents to [facility] clinic today with no complaints. Patient has a history of right breast invasive ductal carcinoma diagnosed 05/04/2019. Patient has undergone neoadjuvant chemotherapy, right breast lumpectomy 11/17/2019, and adjuvant radiation.    Pap Smear: Pap smear not completed today. Last Pap smear was 04/26/2019 at [facility] clinic and was normal. Per patient has no history of an abnormal Pap smear. Last Pap smear result is available in Epic.   Physical exam: Breasts Left breast slightly larger than left breast due to history of right breast lumpectomy 11/17/2019. No skin abnormalities left breast. Radiation changes throughout right breast and scar left lower breast observed on exam. No nipple retraction bilateral breasts. No nipple discharge bilateral breasts. No lymphadenopathy. No lumps palpated bilateral breasts. No complaints of pain or tenderness on exam.    MM Breast Surgical Specimen  Result Date: 11/17/2019 CLINICAL DATA:  Specimen radiograph status post right breast lumpectomy. EXAM: SPECIMEN RADIOGRAPH OF THE RIGHT BREAST COMPARISON:  Previous exam(s). FINDINGS: Status post excision of the right breast. The radioactive seed and biopsy marker clip are present, completely intact, and were marked for pathology. These findings were communicated with the OR at 8:10 a.m. IMPRESSION: Specimen radiograph of the right breast. Electronically Signed   By: Ammie Ferrier M.D.   On: 11/17/2019 08:12   MM DIAG BREAST TOMO BILATERAL  Result Date: 04/09/2021 CLINICAL DATA:  History of RIGHT breast cancer in 2021 status post lumpectomy. BRCA1 gene mutation. EXAM: DIGITAL DIAGNOSTIC BILATERAL MAMMOGRAM WITH TOMOSYNTHESIS AND CAD TECHNIQUE: Bilateral digital diagnostic mammography and breast tomosynthesis was performed. The images were evaluated with computer-aided detection. COMPARISON:  Previous exam(s). ACR Breast Density Category c: The breast  tissue is heterogeneously dense, which may obscure small masses. FINDINGS: There are stable postsurgical changes within the RIGHT breast. There are no new dominant masses, suspicious calcifications or secondary signs of malignancy within either breast. IMPRESSION: No evidence of malignancy within either breast. Stable postsurgical changes within the RIGHT breast. RECOMMENDATION: 1. Bilateral diagnostic mammogram in 1 year. 2. Given the BRCA1 gene mutation, per American cancer sided guidelines, annual screening breast MRIs are also recommended in addition to mammography. Next annual screening breast MRI will be due in March of 2023. I have discussed the findings and recommendations with the patient with the aid of an interpreter. If applicable, a reminder letter will be sent to the patient regarding the next appointment. BI-RADS CATEGORY  2: Benign. Electronically Signed   By: Franki Cabot M.D.   On: 04/09/2021 15:54  MM RT RADIOACTIVE SEED LOC MAMMO GUIDE  Result Date: 11/16/2019 CLINICAL DATA:  31 year old female for radioactive seed localization of RIGHT breast cancer. EXAM: MAMMOGRAPHIC GUIDED RADIOACTIVE SEED LOCALIZATION OF THE RIGHT BREAST COMPARISON:  Previous exam(s). FINDINGS: Patient presents for radioactive seed localization prior to RIGHT lumpectomy. I met with the patient and we discussed the procedure of seed localization including benefits and alternatives. We discussed the high likelihood of a successful procedure. We discussed the risks of the procedure including infection, bleeding, tissue injury and further surgery. We discussed the low dose of radioactivity involved in the procedure. Informed, written consent was given. The usual time-out protocol was performed immediately prior to the procedure. Using mammographic guidance, sterile technique, 1% lidocaine and an I-125 radioactive seed, the Q clip was localized using a LATERAL approach. The follow-up mammogram images confirm the seed in the  expected location and were marked for Dr. Brantley Stage. Follow-up survey of the  patient confirms presence of the radioactive seed. Order number of I-125 seed:  [id number]. Total activity:  2.202 millicuries.  Reference Date: 10/28/2019. The patient tolerated the procedure well and was released from the [facility]. She was given instructions regarding seed removal. IMPRESSION: Radioactive seed localization RIGHT breast. No apparent complications. Electronically Signed   By: Margarette Canada M.D.   On: 11/16/2019 13:27   MS DIGITAL DIAG TOMO BILAT  Result Date: 05/31/2020 CLINICAL DATA:  History of RIGHT breast cancer 2020 status post breast conservation surgery. Per clinic note, positive for BRCA 1 mutation. EXAM: DIGITAL DIAGNOSTIC BILATERAL MAMMOGRAM WITH TOMO AND CAD COMPARISON:  Previous exam(s). ACR Breast Density Category c: The breast tissue is heterogeneously dense, which may obscure small masses. FINDINGS: There are expected postsurgical changes within the RIGHT breast. There are no new dominant masses, suspicious calcifications or secondary signs of malignancy within either breast. Mammographic images were processed with CAD. IMPRESSION: No evidence of malignancy within either breast. Expected postsurgical changes within the RIGHT breast. RECOMMENDATION: 1. Bilateral diagnostic mammogram in 1 year. 2. Given known BRCA 1 mutation, recommend annual screening breast MRI. I have discussed the findings and recommendations with the patient. If applicable, a reminder letter will be sent to the patient regarding the next appointment. BI-RADS CATEGORY  2: Benign. Electronically Signed   By: Franki Cabot M.D.   On: 05/31/2020 13:43   MS DIGITAL DIAG TOMO BILAT  Result Date: 05/05/2019 CLINICAL DATA:  31 year old female with diagnosis of grade 2-3 invasive ductal carcinoma of the right breast post ultrasound-guided biopsy of a 6.1 cm mass in the central right breast yesterday 05/04/2019. An abnormal lymph node in  the right axilla was biopsied and was negative for metastatic disease, however considered discordant given its abnormal appearance. The patient returns today for bilateral diagnostic mammography as this was not initially performed due to her young age. EXAM: DIGITAL DIAGNOSTIC BILATERAL MAMMOGRAM WITH CAD AND TOMO COMPARISON:  Prior right breast ultrasounds dated 04/27/2019 and 05/04/2019. ACR Breast Density Category d: The breast tissue is extremely dense, which lowers the sensitivity of mammography. FINDINGS: Large round mass within the central to slightly inner right breast containing a Q shaped biopsy marking clip compatible with biopsy proven malignancy measures 6.1 cm. No definite additional masses identified. No calcifications seen in the right breast. An abnormal/enlarged lymph node containing a HydroMARK clip is partially visualized within the right axilla. No suspicious masses or calcifications are identified in the left breast. Mammographic images were processed with CAD. IMPRESSION: 1. Biopsy proven malignancy in the right breast measures 6.1 cm mammographically with the previously biopsied morphologically abnormal lymph node partially visualized in the right axilla. No additional masses or calcifications seen in the right breast. 2.  No mammographic evidence of malignancy in the left breast. RECOMMENDATION: 1.  Treatment plan for known right breast malignancy. 2. Given extremely dense fibroglandular tissue, consider contrast enhanced breast MRI. The findings and recommendations were discussed with the patient via [place] interpreter, Santa Lighter. The patient is aware of her biopsy results (biopsy performed yesterday) and was informed of her appointment at [facility] surgery scheduled for tomorrow. I have discussed the findings and recommendations with the patient. If applicable, a reminder letter will be sent to the patient regarding the next appointment. BI-RADS CATEGORY  6: Known biopsy-proven  malignancy. Electronically Signed   By: Everlean Alstrom M.D.   On: 05/05/2019 14:09    Pelvic/Bimanual Pap is not indicated today per BCCCP guidelines.   Smoking  History: Patient has never smoked.   Patient Navigation: Patient education provided. Access to services provided for patient through [place] program. Spanish interpreter Rudene Anda from [facility] provided. Patient has food insecurities. Patient escorted to the food market at the [employer] for groceries.   Breast and Cervical Cancer Risk Assessment: Patient has a family history of her mother and a maternal aunt having breast cancer. Patient has personal history of breast cancer. Patient is positive for the BRCA1 mutation. Patient has history of right breast radiation treatment to the chest before age 60. Patient has no history of cervical dysplasia, immunocompromised, or DES exposure in-utero. Breast cancer risk assessment completed. No breast cancer risk calculated due to patient is less than 84 years old.  Risk Assessment     Risk Scores       07/18/2021 04/09/2021   Last edited by: Royston Bake, CMA Royston Bake, CMA   5-year risk:     Lifetime risk:              A: BCCCP exam with pap smear No complaints.  P: Referred patient to [facility] for a MRI per recommendation. Appointment scheduled Wednesday, August 14, 2021 at 1350.  Loletta Parish, RN 07/18/2021 1:58 PM

## 2021-07-18 NOTE — Patient Instructions (Addendum)
Explained breast self awareness with Brianna Owens. Patient did not need a Pap smear today due to last Pap smear was 04/26/2019. Let her know BCCCP will cover Pap smears every 3 years unless has a history of abnormal Pap smears. Referred patient to [facility] for a MRI per recommendation. Appointment scheduled Wednesday, August 14, 2021 at 1350. Patient aware of appointment and will be there. Let patient know her diagnostic mammogram is recommended in October 2023 at one year and that [facility] will cover. Brianna Owens verbalized understanding.  Brianna Owens, Arvil Chaco, RN 4:08 PM

## 2021-07-20 LAB — ESTRADIOL, ULTRA SENS: Estradiol, Sensitive: 3 pg/mL

## 2021-07-23 ENCOUNTER — Other Ambulatory Visit: Payer: Self-pay | Admitting: *Deleted

## 2021-07-23 DIAGNOSIS — R7401 Elevation of levels of liver transaminase levels: Secondary | ICD-10-CM

## 2021-07-23 DIAGNOSIS — K760 Fatty (change of) liver, not elsewhere classified: Secondary | ICD-10-CM

## 2021-07-23 DIAGNOSIS — C50811 Malignant neoplasm of overlapping sites of right female breast: Secondary | ICD-10-CM

## 2021-07-29 ENCOUNTER — Encounter: Payer: Self-pay | Admitting: Hematology and Oncology

## 2021-07-30 ENCOUNTER — Ambulatory Visit (HOSPITAL_COMMUNITY)
Admission: RE | Admit: 2021-07-30 | Discharge: 2021-07-30 | Disposition: A | Discharge status: Home / Self Care | Payer: Self-pay | Source: Ambulatory Visit | Attending: Hematology and Oncology | Admitting: Hematology and Oncology

## 2021-07-30 DIAGNOSIS — Z171 Estrogen receptor negative status [ER-]: Secondary | ICD-10-CM | POA: Insufficient documentation

## 2021-07-30 DIAGNOSIS — K760 Fatty (change of) liver, not elsewhere classified: Secondary | ICD-10-CM | POA: Insufficient documentation

## 2021-07-30 DIAGNOSIS — C50811 Malignant neoplasm of overlapping sites of right female breast: Secondary | ICD-10-CM | POA: Insufficient documentation

## 2021-07-30 DIAGNOSIS — R7401 Elevation of levels of liver transaminase levels: Secondary | ICD-10-CM | POA: Insufficient documentation

## 2021-07-30 IMAGING — US US ABDOMEN LIMITED
1 series · 14 of 25 positions shown · non-contrast
Comparison: None.

CLINICAL DATA: Elevated liver enzymes

EXAM:
ULTRASOUND ABDOMEN LIMITED RIGHT UPPER QUADRANT

[Series 1: us abdomen limited · 14 of 58 slices shown]
[im 1/58]
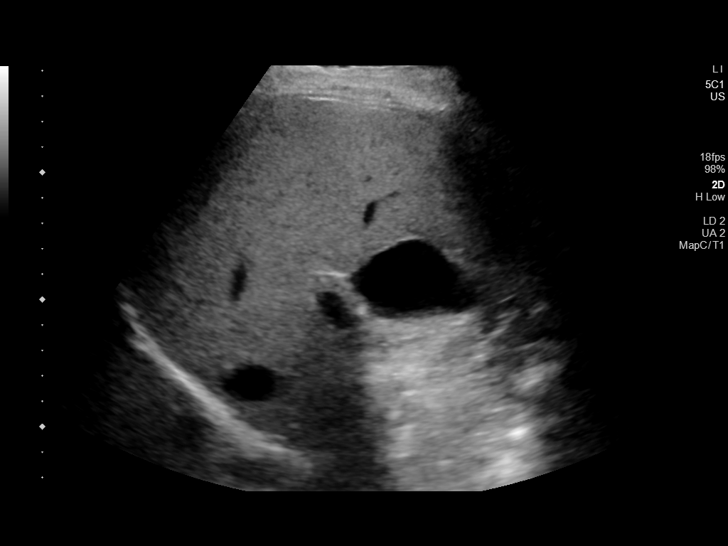
[im 5/58]
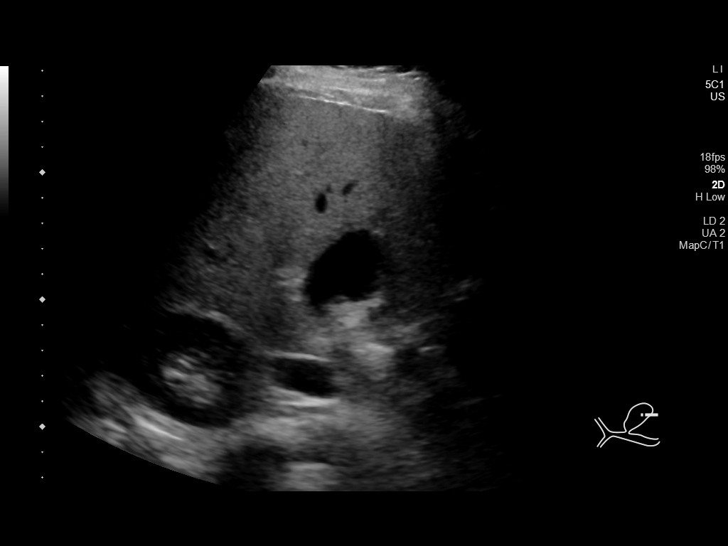
[im 10/58]
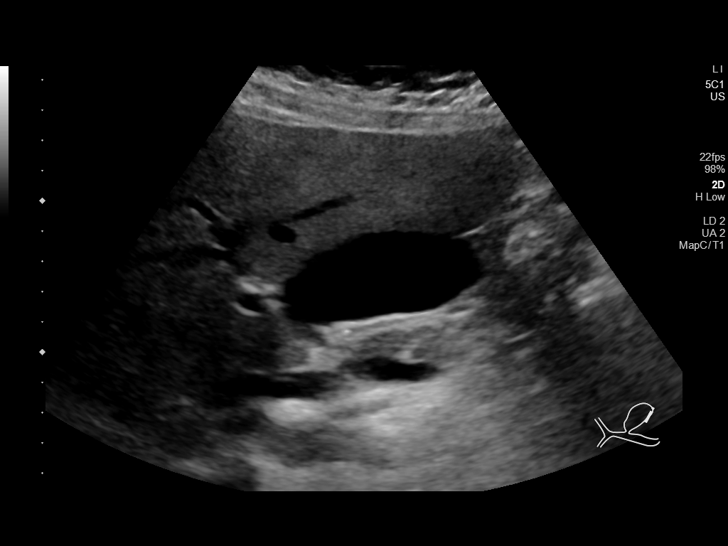
[im 15/58]
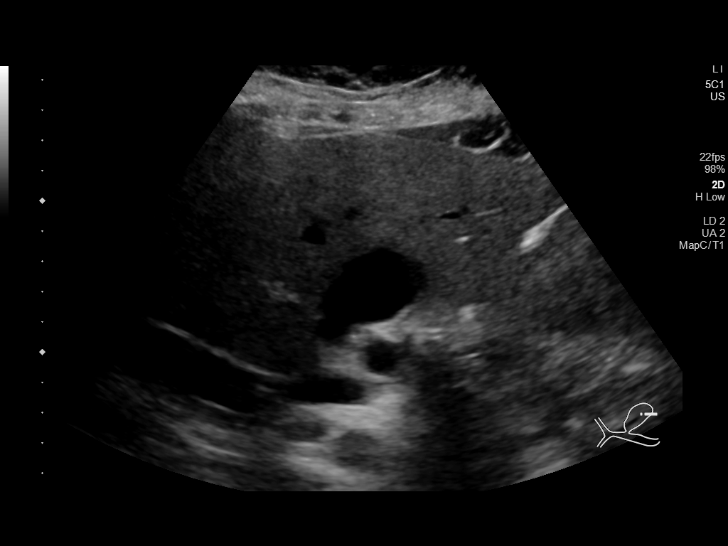
[im 20/58]
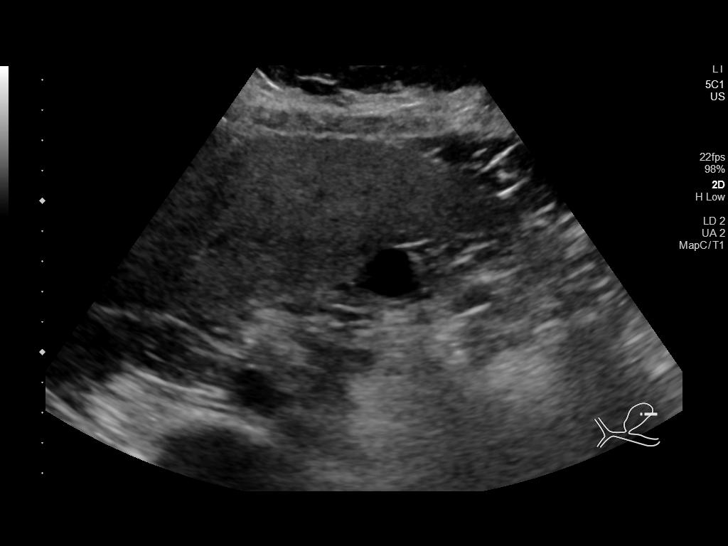
[im 22/58]
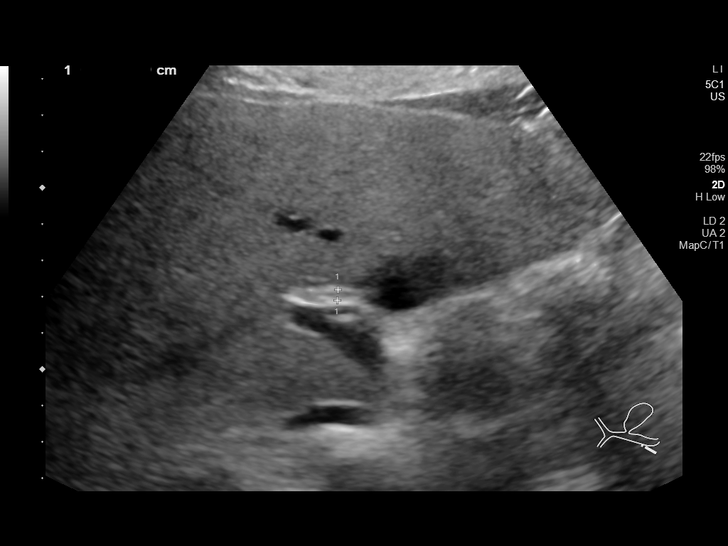
[im 27/58]
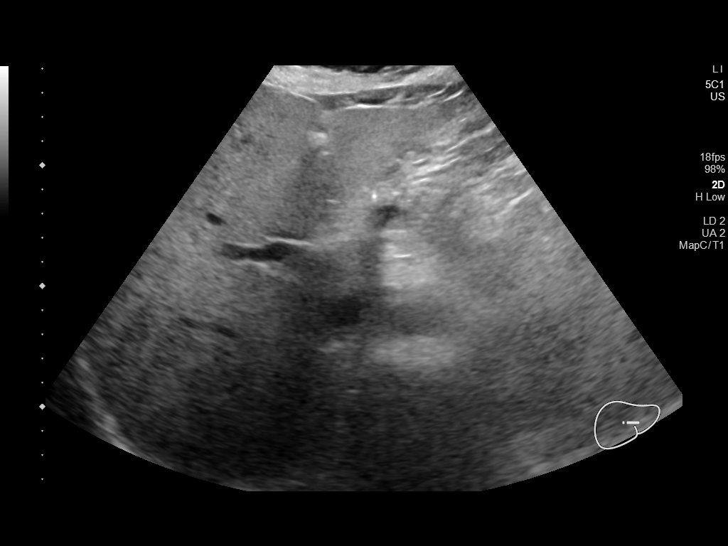
[im 31/58]
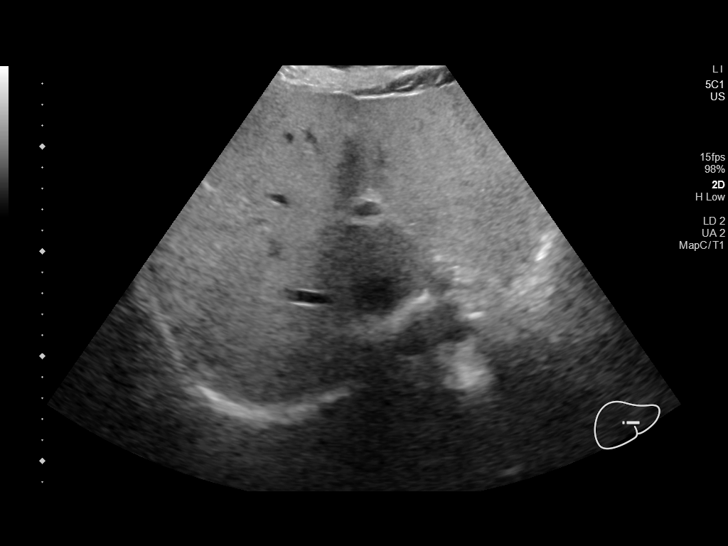
[im 36/58]
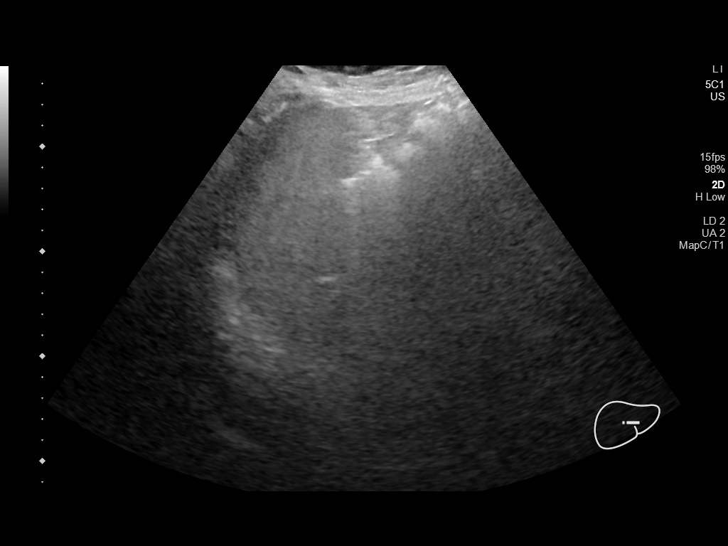
[im 39/58]
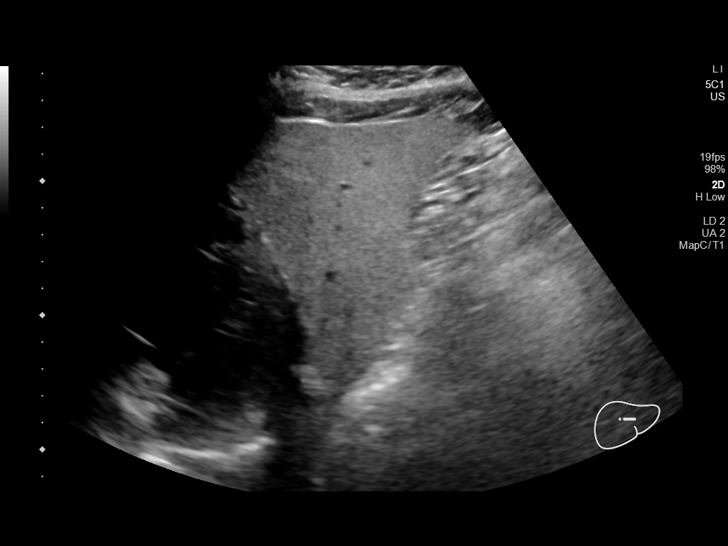
[im 43/58]
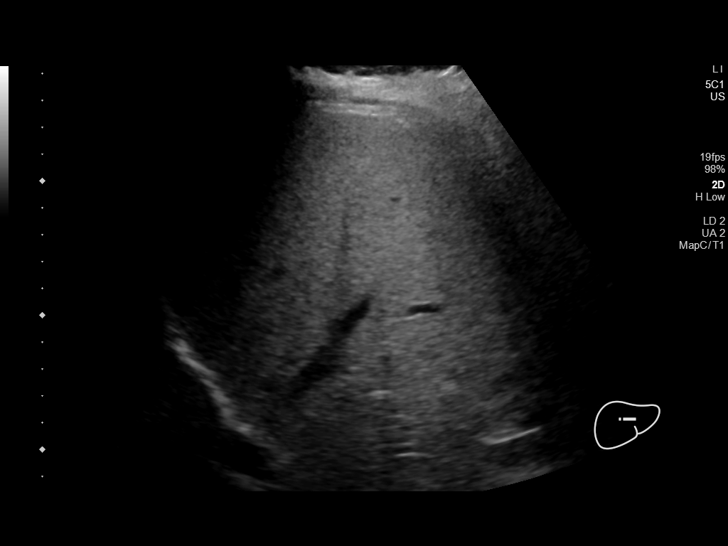
[im 48/58]
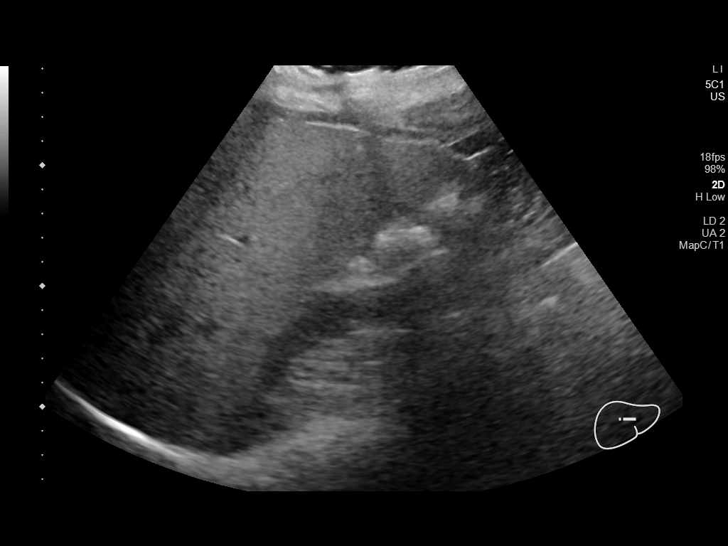
[im 53/58]
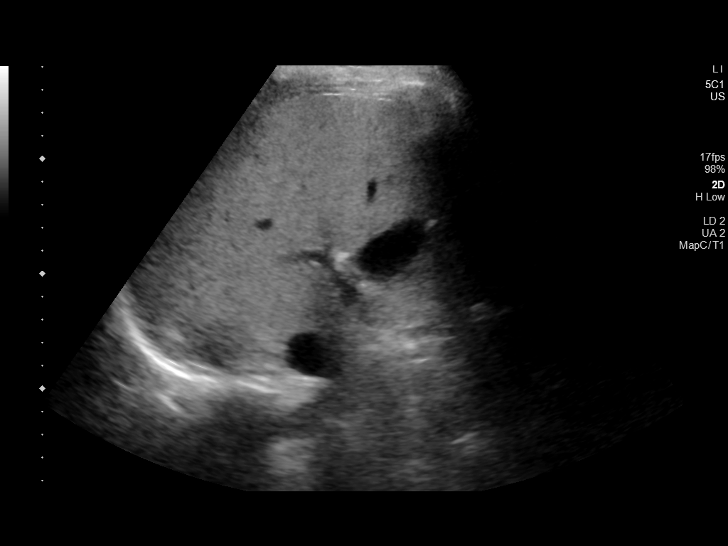
[im 58/58]
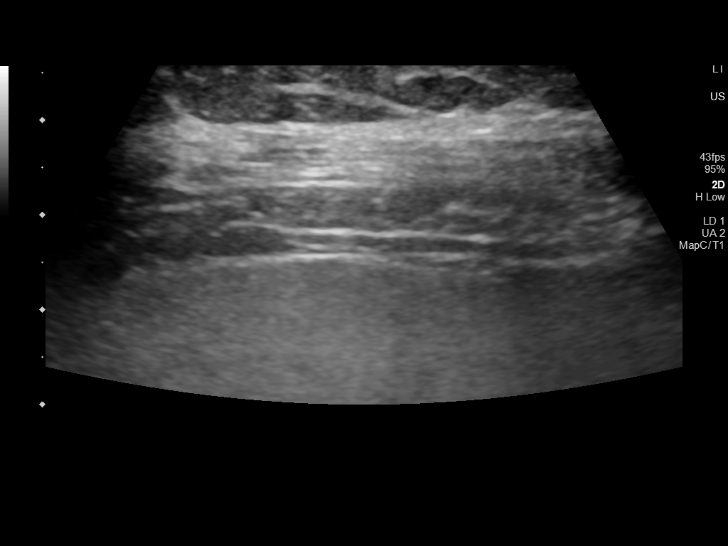

[14 of 25 positions shown; findings below may reference images not displayed]

FINDINGS: Gallbladder:

No gallstones or wall thickening visualized. No sonographic Murphy
sign noted by sonographer.

Common bile duct:

Diameter: 3 mm in proximal diameter

Liver:

Hepatic parenchymal echogenicity is diffusely increased and there is
mild coarsening of the hepatic echotexture diffusely in keeping with
mild hepatic steatosis. No focal intrahepatic masses are seen and
there is no intrahepatic biliary ductal dilation. Portal vein is
patent on color Doppler imaging with normal direction of blood flow
towards the liver.

Other: None.
IMPRESSION: Mild hepatic steatosis.

## 2021-08-14 ENCOUNTER — Encounter: Payer: Self-pay | Admitting: Hematology and Oncology

## 2021-08-14 ENCOUNTER — Ambulatory Visit
Admission: RE | Admit: 2021-08-14 | Discharge: 2021-08-14 | Disposition: A | Discharge status: Home / Self Care | Payer: Self-pay | Source: Ambulatory Visit | Attending: Oncology | Admitting: Oncology

## 2021-08-14 ENCOUNTER — Other Ambulatory Visit: Payer: Self-pay

## 2021-08-14 DIAGNOSIS — Z171 Estrogen receptor negative status [ER-]: Secondary | ICD-10-CM

## 2021-08-14 DIAGNOSIS — C50811 Malignant neoplasm of overlapping sites of right female breast: Secondary | ICD-10-CM

## 2021-08-14 DIAGNOSIS — Z1501 Genetic susceptibility to malignant neoplasm of breast: Secondary | ICD-10-CM

## 2021-08-14 MED ORDER — GADOBUTROL 1 MMOL/ML IV SOLN
9.0000 mL | Freq: Once | INTRAVENOUS | Status: AC | PRN
  Administered 2021-08-14: 9 mL via INTRAVENOUS

## 2021-08-27 ENCOUNTER — Encounter: Payer: Self-pay | Admitting: Hematology and Oncology

## 2021-08-27 NOTE — Progress Notes (Signed)
Lupron was re-approved for Replacement from [facility].  ?Approved 08/09/2020 to 08/25/2022. No lapse in coverage, continued care.  ?Original Expiration of Assistance: 08/08/2021. ?Reason: Self-Pay ?ID: [id number] ?Next DOS: 08/14/2021  ? ?

## 2021-10-22 ENCOUNTER — Encounter: Payer: Self-pay | Admitting: Hematology and Oncology

## 2021-10-22 ENCOUNTER — Inpatient Hospital Stay: Payer: No Typology Code available for payment source

## 2021-10-22 ENCOUNTER — Other Ambulatory Visit: Payer: Self-pay

## 2021-10-22 ENCOUNTER — Inpatient Hospital Stay: Payer: Self-pay | Attending: Oncology | Admitting: Hematology and Oncology

## 2021-10-22 VITALS — BP 135/95 | HR 107 | Temp 98.1°F | Resp 18 | Ht 61.0 in | Wt 175.5 lb

## 2021-10-22 DIAGNOSIS — Z9189 Other specified personal risk factors, not elsewhere classified: Secondary | ICD-10-CM

## 2021-10-22 DIAGNOSIS — Z803 Family history of malignant neoplasm of breast: Secondary | ICD-10-CM | POA: Insufficient documentation

## 2021-10-22 DIAGNOSIS — K760 Fatty (change of) liver, not elsewhere classified: Secondary | ICD-10-CM | POA: Insufficient documentation

## 2021-10-22 DIAGNOSIS — Z95828 Presence of other vascular implants and grafts: Secondary | ICD-10-CM

## 2021-10-22 DIAGNOSIS — M8448XA Pathological fracture, other site, initial encounter for fracture: Secondary | ICD-10-CM | POA: Insufficient documentation

## 2021-10-22 DIAGNOSIS — Z171 Estrogen receptor negative status [ER-]: Secondary | ICD-10-CM | POA: Insufficient documentation

## 2021-10-22 DIAGNOSIS — Z853 Personal history of malignant neoplasm of breast: Secondary | ICD-10-CM

## 2021-10-22 DIAGNOSIS — Z1501 Genetic susceptibility to malignant neoplasm of breast: Secondary | ICD-10-CM

## 2021-10-22 DIAGNOSIS — Z01419 Encounter for gynecological examination (general) (routine) without abnormal findings: Secondary | ICD-10-CM

## 2021-10-22 DIAGNOSIS — C50811 Malignant neoplasm of overlapping sites of right female breast: Secondary | ICD-10-CM | POA: Insufficient documentation

## 2021-10-22 MED ORDER — LEUPROLIDE ACETATE 3.75 MG IM KIT
3.7500 mg | PACK | Freq: Once | INTRAMUSCULAR | Status: AC
  Administered 2021-10-22: 3.75 mg via INTRAMUSCULAR
  Filled 2021-10-22: qty 3.75

## 2021-10-22 NOTE — Progress Notes (Signed)
?[place]  ?[phone] [phone]  ? ? ? ?ID: Brianna Owens DOB: 02-05-1991  MR#: [id number]  QIW#:[id number] ? ?Patient Care Team: ?Magrinat, [name], MD (Inactive) as Consulting Physician (Oncology) ?Erroll Luna, MD as Consulting Physician (General Surgery) ?[name], RN as Oncology Nurse Navigator ?[name], RN as Oncology Nurse Navigator ?Eppie Gibson, MD as Attending Physician (Radiation Oncology) ?Benay Pike, MD as Consulting Physician (Hematology and Oncology) ?Benay Pike, MD ?OTHER MD: ? ?CHIEF COMPLAINT: Triple negative breast cancer, BRCA1 positive ? ?CURRENT TREATMENT: continuing Luprolide monthly ? ?INTERVAL HISTORY: ? ?Certified Spanish interpreter was present during this conversation. ?Brianna Owens returns today for follow up of her triple negative breast cancer.   ?She mentioned that she was planning to move to [place] however these plans have changed.  Since last visit she denies any new complaints except for some tenderness below her surgical scar.  She denies any other changes in breathing or bowel habits or urinary habits.  She is not willing to undergo any further surgery at this time.  She is willing to continue Lupron shots. ?Rest of the pertinent 10 point ROS reviewed and negative. ? ? COVID 19 VACCINATION STATUS: Status post Pfizer x2 most recently August 2021 ?  ? ?HISTORY OF CURRENT ILLNESS: ?From the original intake note: ? ?Brianna Owens [facility] with a 3 month history of a right breast lump that became painful in early 04/2019. Physical exam performed at that time showed a palpable, tender 13 cm lump within the right center breast under the nipple area. She underwent right breast ultrasonography at [facility] on 04/27/2019 showing: 6.1 cm palpable mass centered in the 5 o'clock position of the right breast; single right inferomedial  axillary lymph node with focal cortical thickening inferiorly. ? ?Accordingly on 05/04/2019 she proceeded to biopsy of the right breast area in question. The pathology from this procedure ([id number]) showed: invasive ductal carcinoma, grade 2-3. Prognostic indicators significant for: estrogen receptor, 0% negative and progesterone receptor, 0% negative. Proliferation marker Ki67 at 40%.  I do not find HER-2 receptor documentation ? ?The right axillary lymph node biopsied at that time showed reactive germinal centers. ? ?She underwent bilateral diagnostic mammography with tomography at [facility] on 05/05/2019 showing: breast density category D; biopsy-proven right breast malignancy measures 6.1 cm; no additional masses or calcifications seen in the right breast; no evidence of malignancy in the left breast. ? ?The patient's subsequent history is as detailed below. ? ? ?PAST MEDICAL HISTORY: ?Past Medical History:  ?Diagnosis Date  ? Breast cancer ([place])   ? Cancer ([place]) 05/05/2019  ? Personal history of chemotherapy   ? Personal history of radiation therapy   ? ? ?PAST SURGICAL HISTORY: ?Past Surgical History:  ?Procedure Laterality Date  ? BREAST BIOPSY    ? BREAST LUMPECTOMY    ? BREAST LUMPECTOMY WITH RADIOACTIVE SEED AND SENTINEL LYMPH NODE BIOPSY Right 11/17/2019  ? Procedure: RIGHT BREAST LUMPECTOMY WITH RADIOACTIVE SEED;  Surgeon: Erroll Luna, MD;  Location: [facility];  Service: General;  Laterality: Right;  PEC BLOCK  ? IR IMAGING GUIDED PORT INSERTION  05/24/2019  ? IR REMOVAL TUN ACCESS W/ PORT W/O FL MOD SED  07/18/2020  ? SENTINEL NODE BIOPSY Right 11/17/2019  ? Procedure: Sentinel Node Biopsy;  Surgeon: Erroll Luna, MD;  Location: [facility];  Service: General;  Laterality: Right;  ? ? ?FAMILY HISTORY: ?Family History  ?Problem  Relation Age of Onset  ? Breast cancer Mother   ? Breast cancer Maternal Aunt   ?Patient's father is 105 and her mother 2 as of November 2020.  The patient's mother was diagnosed  with breast cancer in her early 24s.  She lives in [place]. The patient's mother's sister was also diagnosed with breast cancer in her early 28s.  The patient herself has 1 sister, no brothers.  She is not aware of any ovarian cancer cases in the family but tells me "all the women in my family had their ovaries removed". ? ? ?GYNECOLOGIC HISTORY:  ?No LMP recorded. (Menstrual status: Irregular Periods). ?Menarche: 31 years old ?Age at first live birth: 31 years old ?GX P 2 ?LMP regular ?Contraceptive ?HRT n/a  ?Hysterectomy? no ?BSO? no ? ? ?SOCIAL HISTORY: (updated February 2022)  ?Inger normally works at [employer], Saturday and Sunday every other weekend.  She is originally from [place].  She is divorced.  Her children are [name], 76 years old, living in [place] with his father, and [name], 6 years old, who lives with the patient.   ? ? ADVANCED DIRECTIVES: Not in place ? ? ?HEALTH MAINTENANCE: ?Social History  ? ?Tobacco Use  ? Smoking status: Never  ? Smokeless tobacco: Never  ?Vaping Use  ? Vaping Use: Never used  ?Substance Use Topics  ? Alcohol use: Not Currently  ? Drug use: Not Currently  ? ? ? Colonoscopy: n/a ? PAP: 04/26/2019, negative ? Bone density: n/a ?  ?No Known Allergies ? ?Current Outpatient Medications  ?Medication Sig Dispense Refill  ? ibuprofen (ADVIL) 800 MG tablet Take 1 tablet (800 mg total) by mouth every 8 (eight) hours as needed. (Patient not taking: Reported on 04/09/2021) 30 tablet 0  ? methocarbamol (ROBAXIN) 500 MG tablet Take 1 tablet (500 mg total) by mouth 4 (four) times daily. (Patient not taking: Reported on 04/09/2021) 40 tablet 0  ? ?No current facility-administered medications for this visit.  ? ? ?OBJECTIVE:  Spanish speaker who appears stated age ? ?Vitals:  ? 10/22/21 1543  ?BP: (!) 135/95  ?Pulse: (!) 107  ?Resp: 18  ?Temp: 98.1 ?F (36.7 ?C)  ?SpO2: 100%  ? ?   Body mass index is 33.16 kg/m?.   ?Wt Readings from Last 3 Encounters:  ?10/22/21 175 lb 8 oz (79.6 kg)   ?07/18/21 174 lb 9.6 oz (79.2 kg)  ?07/17/21 174 lb 14.4 oz (79.3 kg)  ? ? ?  ECOG FS:1 - Symptomatic but completely ambulatory ? ?Physical Exam ?Constitutional:   ?   Appearance: Normal appearance.  ?Cardiovascular:  ?   Rate and Rhythm: Normal rate and regular rhythm.  ?Pulmonary:  ?   Effort: Pulmonary effort is normal.  ?   Breath sounds: Normal breath sounds.  ?Chest:  ?   Comments: Bilateral breasts inspected.  No palpable masses.  Tenderness is right below the surgical scar.  No abnormalities detected.  No regional adenopathy. ?Abdominal:  ?   General: Abdomen is flat. Bowel sounds are normal.  ?   Palpations: Abdomen is soft.  ?Musculoskeletal:     ?   General: No swelling or tenderness. Normal range of motion.  ?   Cervical back: Normal range of motion and neck supple.  ?Skin: ?   General: Skin is warm and dry.  ?Neurological:  ?   Mental Status: She is alert.  ? ? ? ?LAB RESULTS: ? ?CMP  ?   ?Component Value Date/Time  ? NA  137 07/17/2021 1444  ? K 3.8 07/17/2021 1444  ? CL 103 07/17/2021 1444  ? CO2 24 07/17/2021 1444  ? GLUCOSE 102 (H) 07/17/2021 1444  ? BUN 11 07/17/2021 1444  ? CREATININE 0.47 07/17/2021 1444  ? CREATININE 0.45 07/21/2019 1025  ? CALCIUM 9.3 07/17/2021 1444  ? PROT 8.3 (H) 07/17/2021 1444  ? ALBUMIN 4.3 07/17/2021 1444  ? AST 70 (H) 07/17/2021 1444  ? AST 29 07/21/2019 1025  ? ALT 141 (H) 07/17/2021 1444  ? ALT 58 (H) 07/21/2019 1025  ? ALKPHOS 130 (H) 07/17/2021 1444  ? BILITOT 0.3 07/17/2021 1444  ? BILITOT 0.3 07/21/2019 1025  ? GFRNONAA >60 07/17/2021 1444  ? GFRNONAA >60 07/21/2019 1025  ? GFRAA >60 03/16/2020 1011  ? GFRAA >60 07/21/2019 1025  ? ? ?No results found for: TOTALPROTELP, ALBUMINELP, A1GS, A2GS, BETS, BETA2SER, GAMS, MSPIKE, SPEI ? ?No results found for: KPAFRELGTCHN, LAMBDASER, KAPLAMBRATIO ? ?Lab Results  ?Component Value Date  ? WBC 8.8 07/17/2021  ? NEUTROABS 4.6 07/17/2021  ? HGB 12.3 07/17/2021  ? HCT 35.6 (L) 07/17/2021  ? MCV 89.0 07/17/2021  ? PLT 250  07/17/2021  ? ? ?No results found for: LABCA2 ? ?No components found for: QJFHLK562 ? ?No results for input(s): INR in the last 168 hours. ? ?No results found for: LABCA2 ? ?No results found for: BWL893 ? ?No results

## 2021-10-31 ENCOUNTER — Telehealth: Payer: Self-pay | Admitting: *Deleted

## 2021-10-31 ENCOUNTER — Other Ambulatory Visit: Payer: Self-pay

## 2021-10-31 ENCOUNTER — Inpatient Hospital Stay (HOSPITAL_BASED_OUTPATIENT_CLINIC_OR_DEPARTMENT_OTHER): Payer: Self-pay | Admitting: Physician Assistant

## 2021-10-31 ENCOUNTER — Emergency Department (HOSPITAL_COMMUNITY)
Admission: EM | Admit: 2021-10-31 | Discharge: 2021-10-31 | Disposition: A | Discharge status: Home / Self Care | Payer: No Typology Code available for payment source | Attending: Emergency Medicine | Admitting: Emergency Medicine

## 2021-10-31 ENCOUNTER — Emergency Department (HOSPITAL_COMMUNITY): Payer: No Typology Code available for payment source

## 2021-10-31 ENCOUNTER — Encounter (HOSPITAL_COMMUNITY): Payer: Self-pay | Admitting: Oncology

## 2021-10-31 DIAGNOSIS — R1011 Right upper quadrant pain: Secondary | ICD-10-CM | POA: Insufficient documentation

## 2021-10-31 DIAGNOSIS — K760 Fatty (change of) liver, not elsewhere classified: Secondary | ICD-10-CM | POA: Insufficient documentation

## 2021-10-31 DIAGNOSIS — K829 Disease of gallbladder, unspecified: Secondary | ICD-10-CM | POA: Insufficient documentation

## 2021-10-31 DIAGNOSIS — N644 Mastodynia: Secondary | ICD-10-CM

## 2021-10-31 LAB — CBC WITH DIFFERENTIAL/PLATELET
Abs Immature Granulocytes: 0.02 10*3/uL (ref 0.00–0.07)
Basophils Absolute: 0 10*3/uL (ref 0.0–0.1)
Basophils Relative: 0 %
Eosinophils Absolute: 0.2 10*3/uL (ref 0.0–0.5)
Eosinophils Relative: 3 %
HCT: 37.8 % (ref 36.0–46.0)
Hemoglobin: 13.1 g/dL (ref 12.0–15.0)
Immature Granulocytes: 0 %
Lymphocytes Relative: 35 %
Lymphs Abs: 2.9 10*3/uL (ref 0.7–4.0)
MCH: 31.7 pg (ref 26.0–34.0)
MCHC: 34.7 g/dL (ref 30.0–36.0)
MCV: 91.5 fL (ref 80.0–100.0)
Monocytes Absolute: 0.5 10*3/uL (ref 0.1–1.0)
Monocytes Relative: 6 %
Neutro Abs: 4.8 10*3/uL (ref 1.7–7.7)
Neutrophils Relative %: 56 %
Platelets: 255 10*3/uL (ref 150–400)
RBC: 4.13 MIL/uL (ref 3.87–5.11)
RDW: 12.2 % (ref 11.5–15.5)
WBC: 8.5 10*3/uL (ref 4.0–10.5)
nRBC: 0 % (ref 0.0–0.2)

## 2021-10-31 LAB — URINALYSIS, ROUTINE W REFLEX MICROSCOPIC
Bilirubin Urine: NEGATIVE
Glucose, UA: NEGATIVE mg/dL
Hgb urine dipstick: NEGATIVE
Ketones, ur: NEGATIVE mg/dL
Nitrite: NEGATIVE
Protein, ur: NEGATIVE mg/dL
Specific Gravity, Urine: 1.017 (ref 1.005–1.030)
pH: 6 (ref 5.0–8.0)

## 2021-10-31 LAB — COMPREHENSIVE METABOLIC PANEL
ALT: 142 U/L — ABNORMAL HIGH (ref 0–44)
AST: 78 U/L — ABNORMAL HIGH (ref 15–41)
Albumin: 4 g/dL (ref 3.5–5.0)
Alkaline Phosphatase: 97 U/L (ref 38–126)
Anion gap: 9 (ref 5–15)
BUN: 10 mg/dL (ref 6–20)
CO2: 25 mmol/L (ref 22–32)
Calcium: 9.5 mg/dL (ref 8.9–10.3)
Chloride: 105 mmol/L (ref 98–111)
Creatinine, Ser: 0.52 mg/dL (ref 0.44–1.00)
GFR, Estimated: >60 mL/min (ref >60)
Glucose, Bld: 107 mg/dL — ABNORMAL HIGH (ref 70–99)
Potassium: 4 mmol/L (ref 3.5–5.1)
Sodium: 139 mmol/L (ref 135–145)
Total Bilirubin: 0.7 mg/dL (ref 0.3–1.2)
Total Protein: 8 g/dL (ref 6.5–8.1)

## 2021-10-31 LAB — HCG, SERUM, QUALITATIVE: Preg, Serum: NEGATIVE

## 2021-10-31 IMAGING — US US ABDOMEN LIMITED
1 series · 14 of 25 positions shown · non-contrast
Comparison: Ultrasound evaluation [DATE].

CLINICAL DATA: RIGHT upper quadrant pain for 3 weeks.

EXAM:
ULTRASOUND ABDOMEN LIMITED RIGHT UPPER QUADRANT

[Series 1: us abdomen limited ruq (liver/gb) · 14 of 82 slices shown]
[im 1/82]
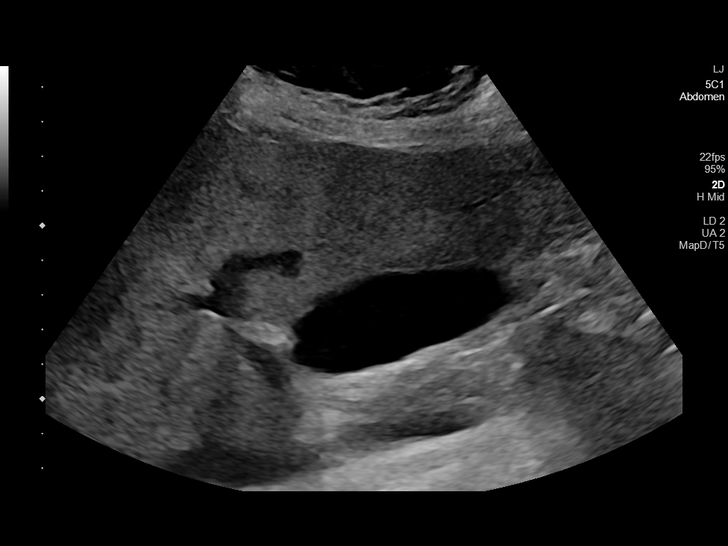
[im 7/82]
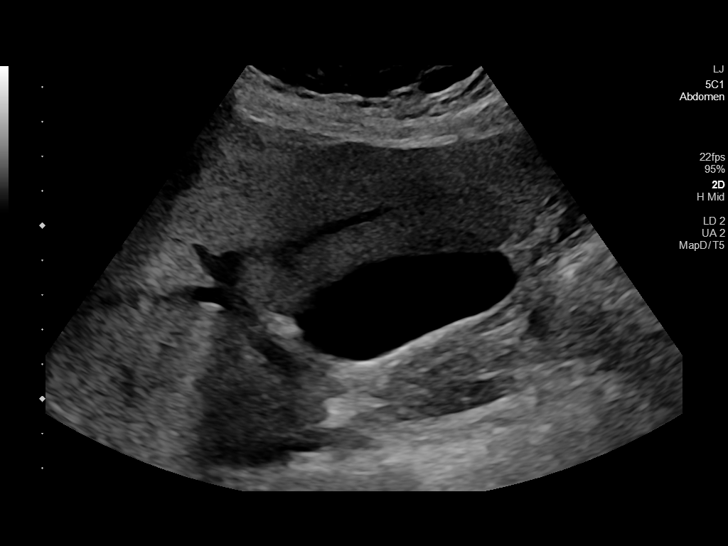
[im 14/82]
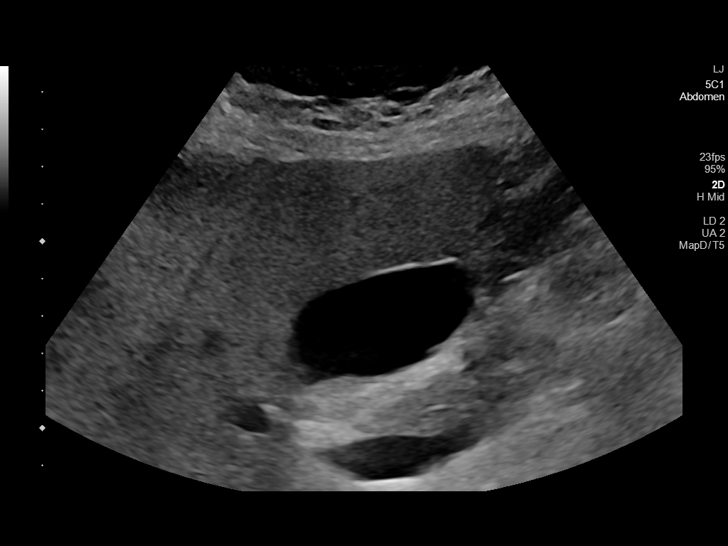
[im 21/82]
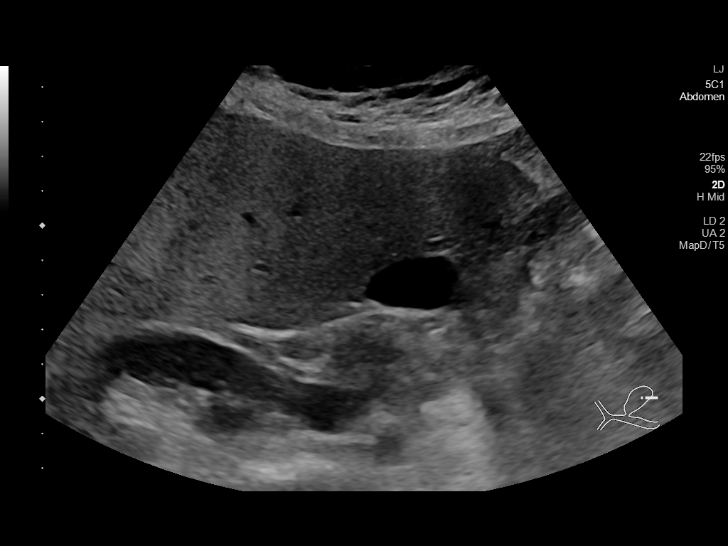
[im 28/82]
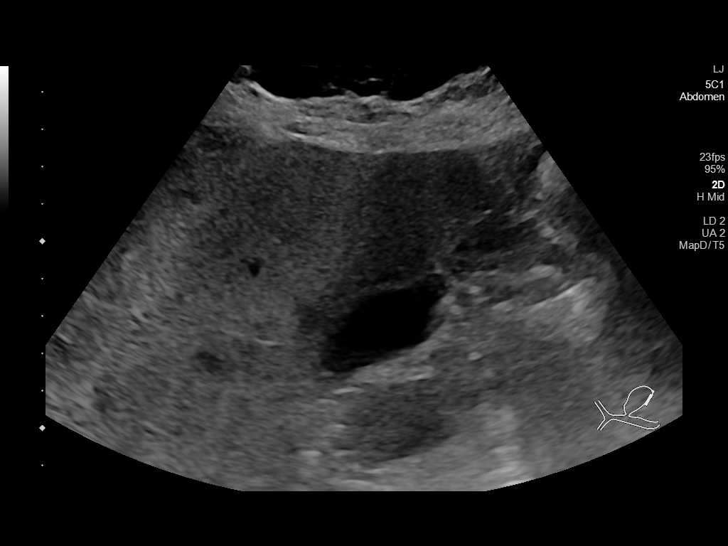
[im 31/82]
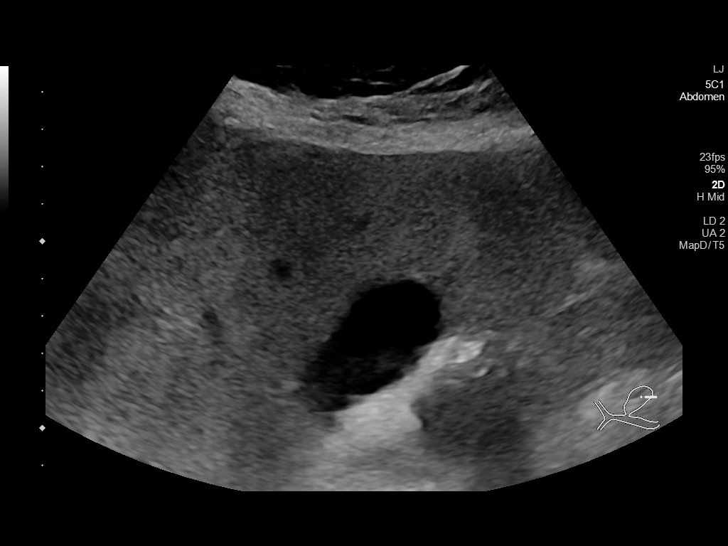
[im 38/82]
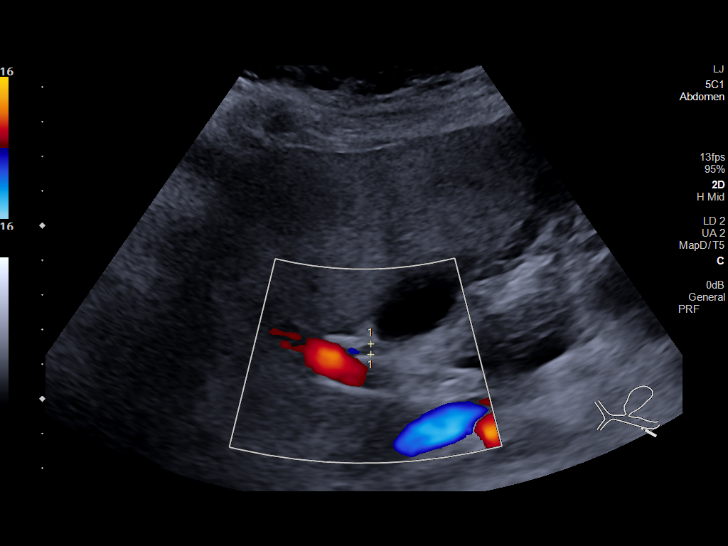
[im 44/82]
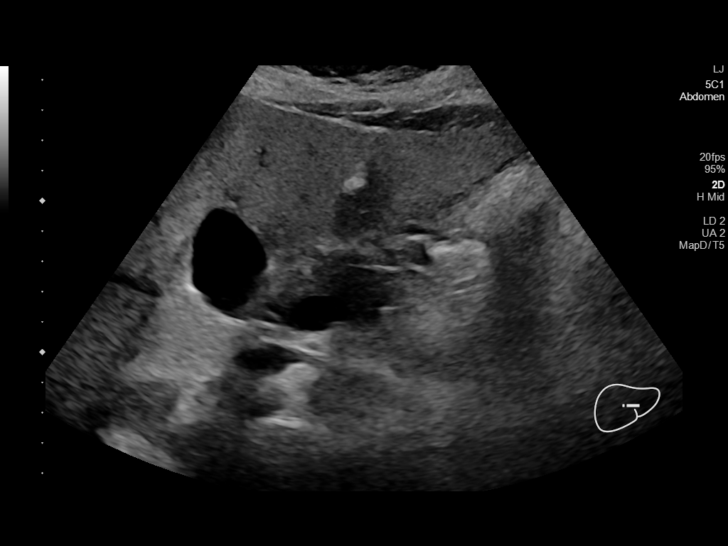
[im 51/82]
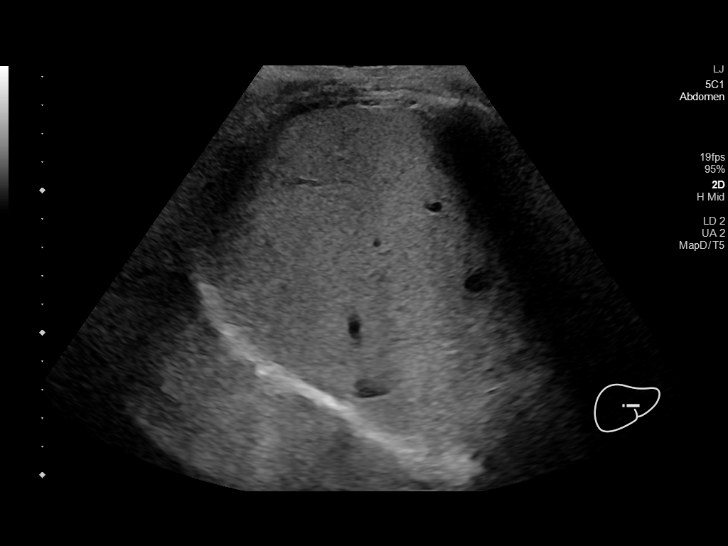
[im 55/82]
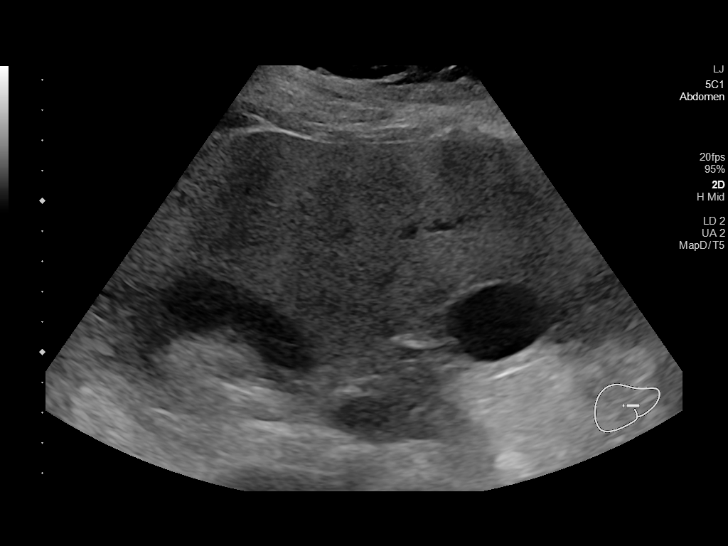
[im 61/82]
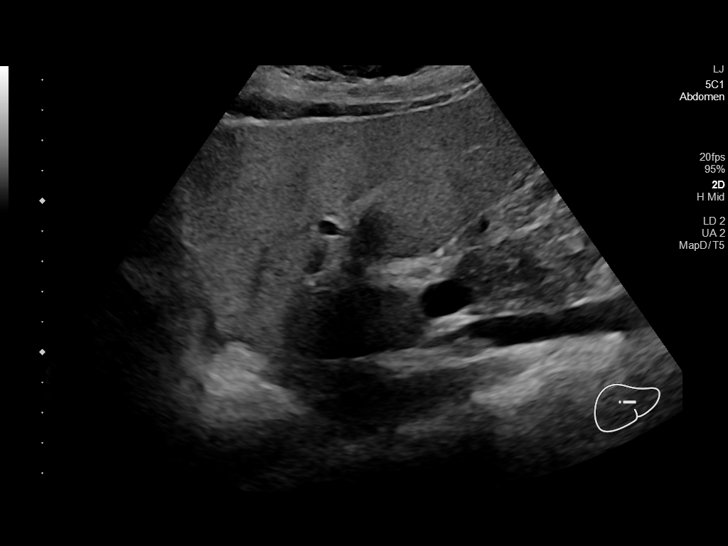
[im 68/82]
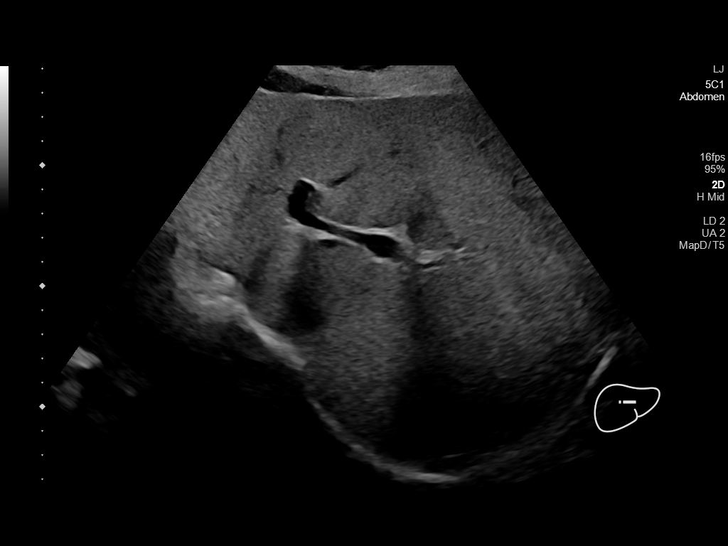
[im 75/82]
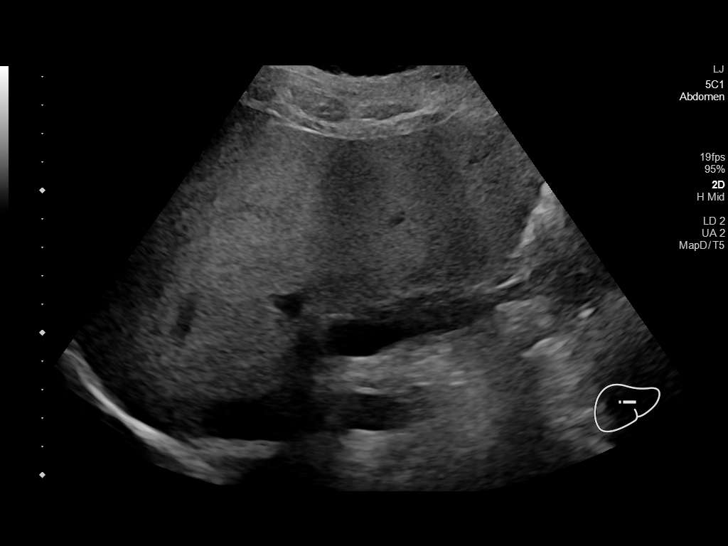
[im 82/82]
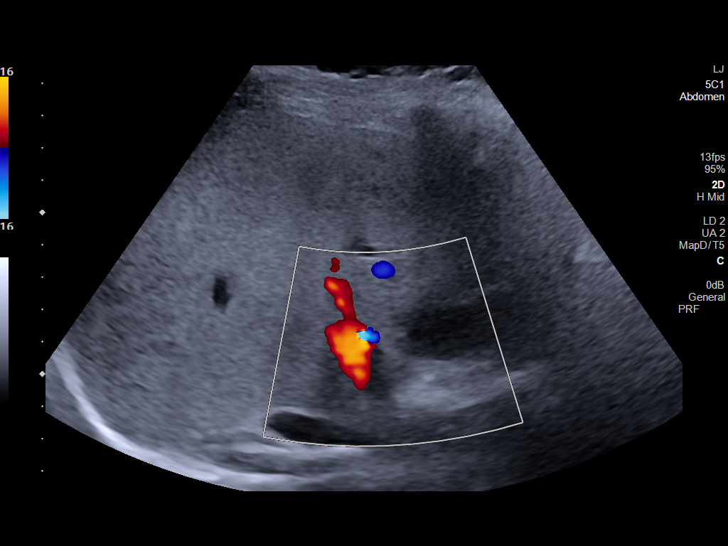

[14 of 25 positions shown; findings below may reference images not displayed]

FINDINGS: Gallbladder:

Small amount of sludge in the gallbladder. No signs of tenderness
over the gallbladder during the examination. No wall thickening or
pericholecystic fluid.

Common bile duct:

Diameter: 3.0 mm

Liver:

Heterogeneous predominantly echogenic liver without focal
abnormality visualized. Portal vein is patent on color Doppler
imaging with normal direction of blood flow towards the liver.

Other: Mildly limited exam due to patient breathing and motion.
IMPRESSION: No acute biliary findings, small amount of sludge in the
gallbladder.

Heterogeneous predominantly echogenic parenchymal pattern in the
liver compatible with at least moderate steatosis. Would correlate
with any clinical or laboratory evidence of liver disease and or
steatohepatitis, no specific imaging findings to suggest this,
though may warrant assessment given heterogeneity and mildly
coarsened echotexture on today's exam.

## 2021-10-31 IMAGING — CR DG RIBS W/ CHEST 3+V*R*
3 series · 3 of 3 positions shown · non-contrast
Comparison: None Available.

CLINICAL DATA: Right rib pain

EXAM:
RIGHT RIBS AND CHEST - 3+ VIEW

[w chest pa]
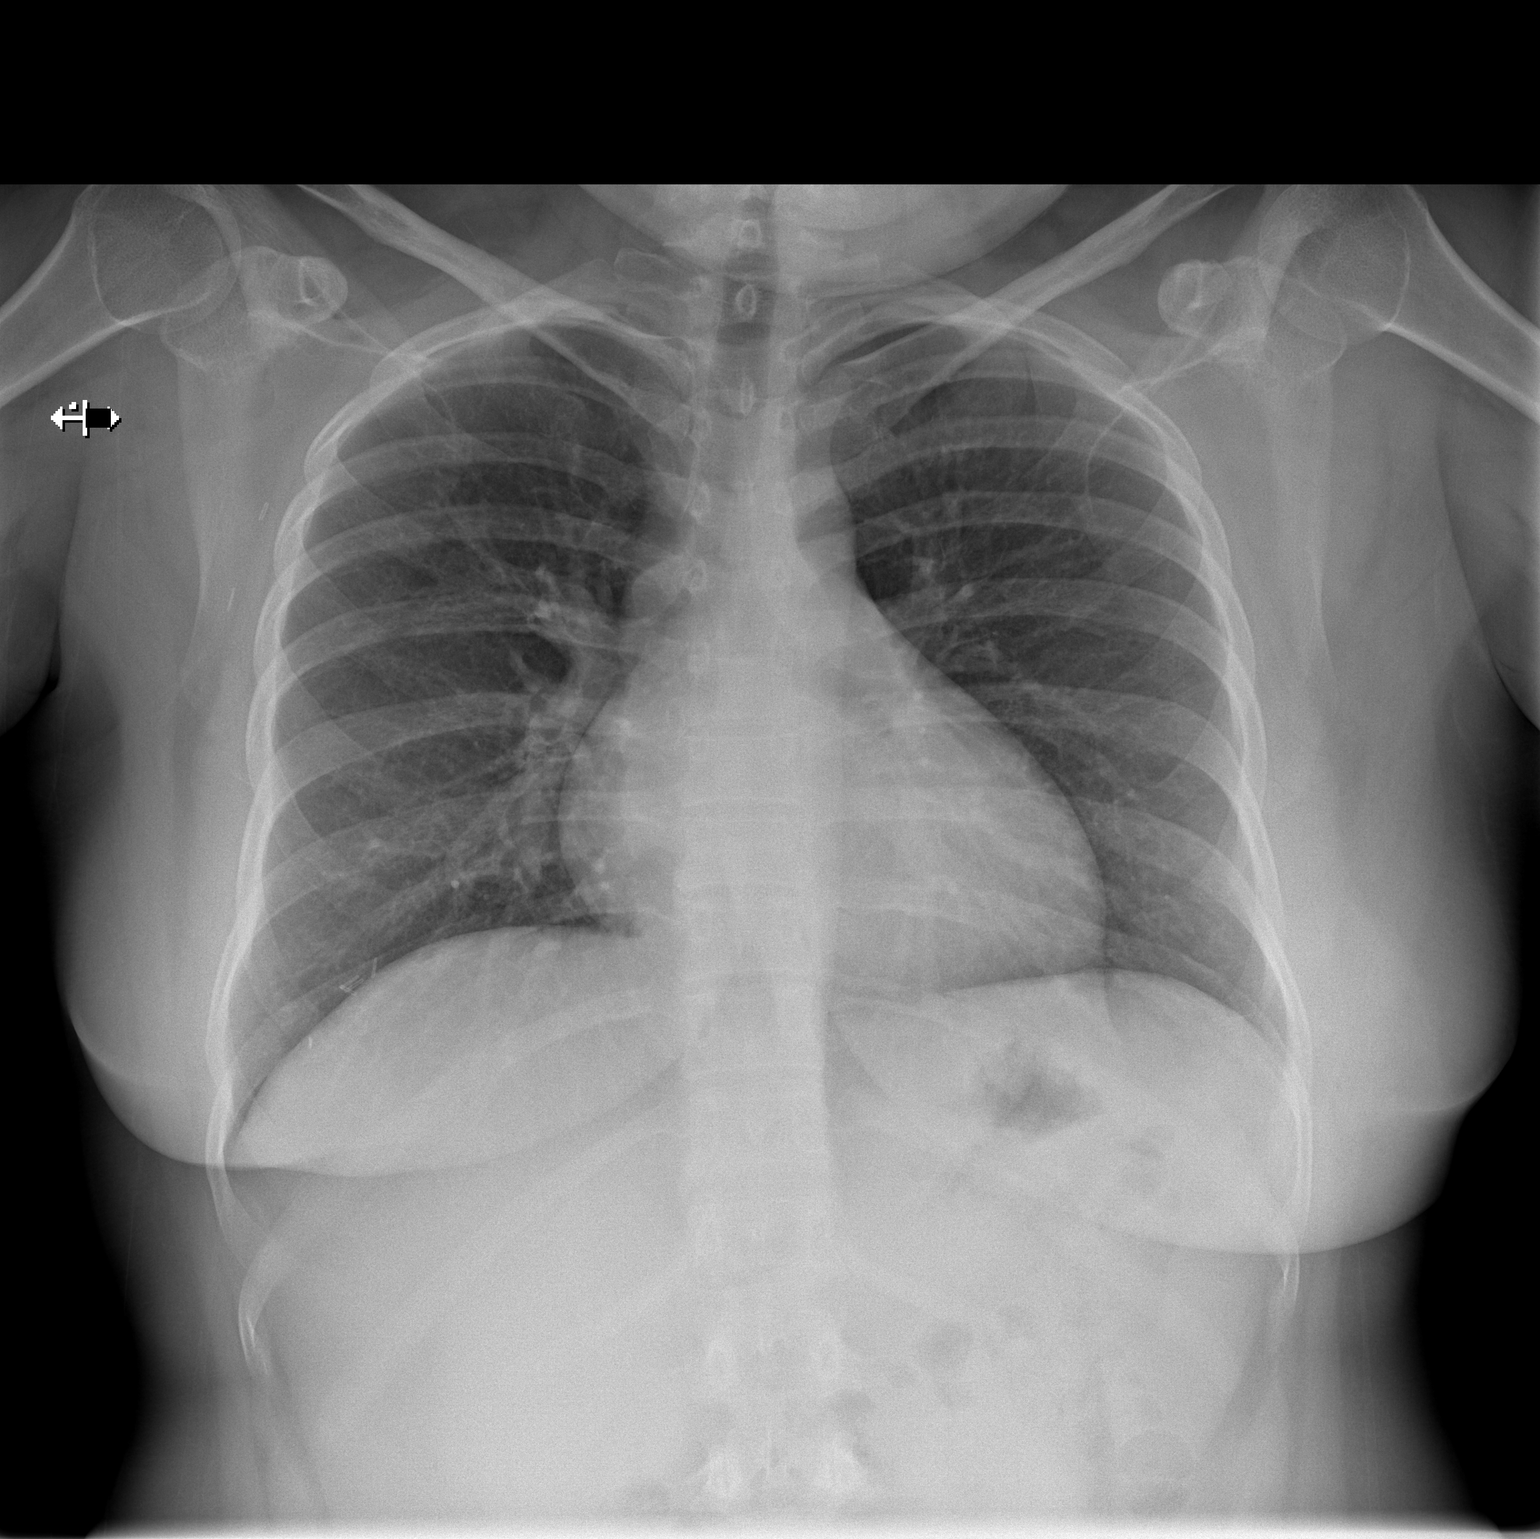

[w ribs ap upper right]
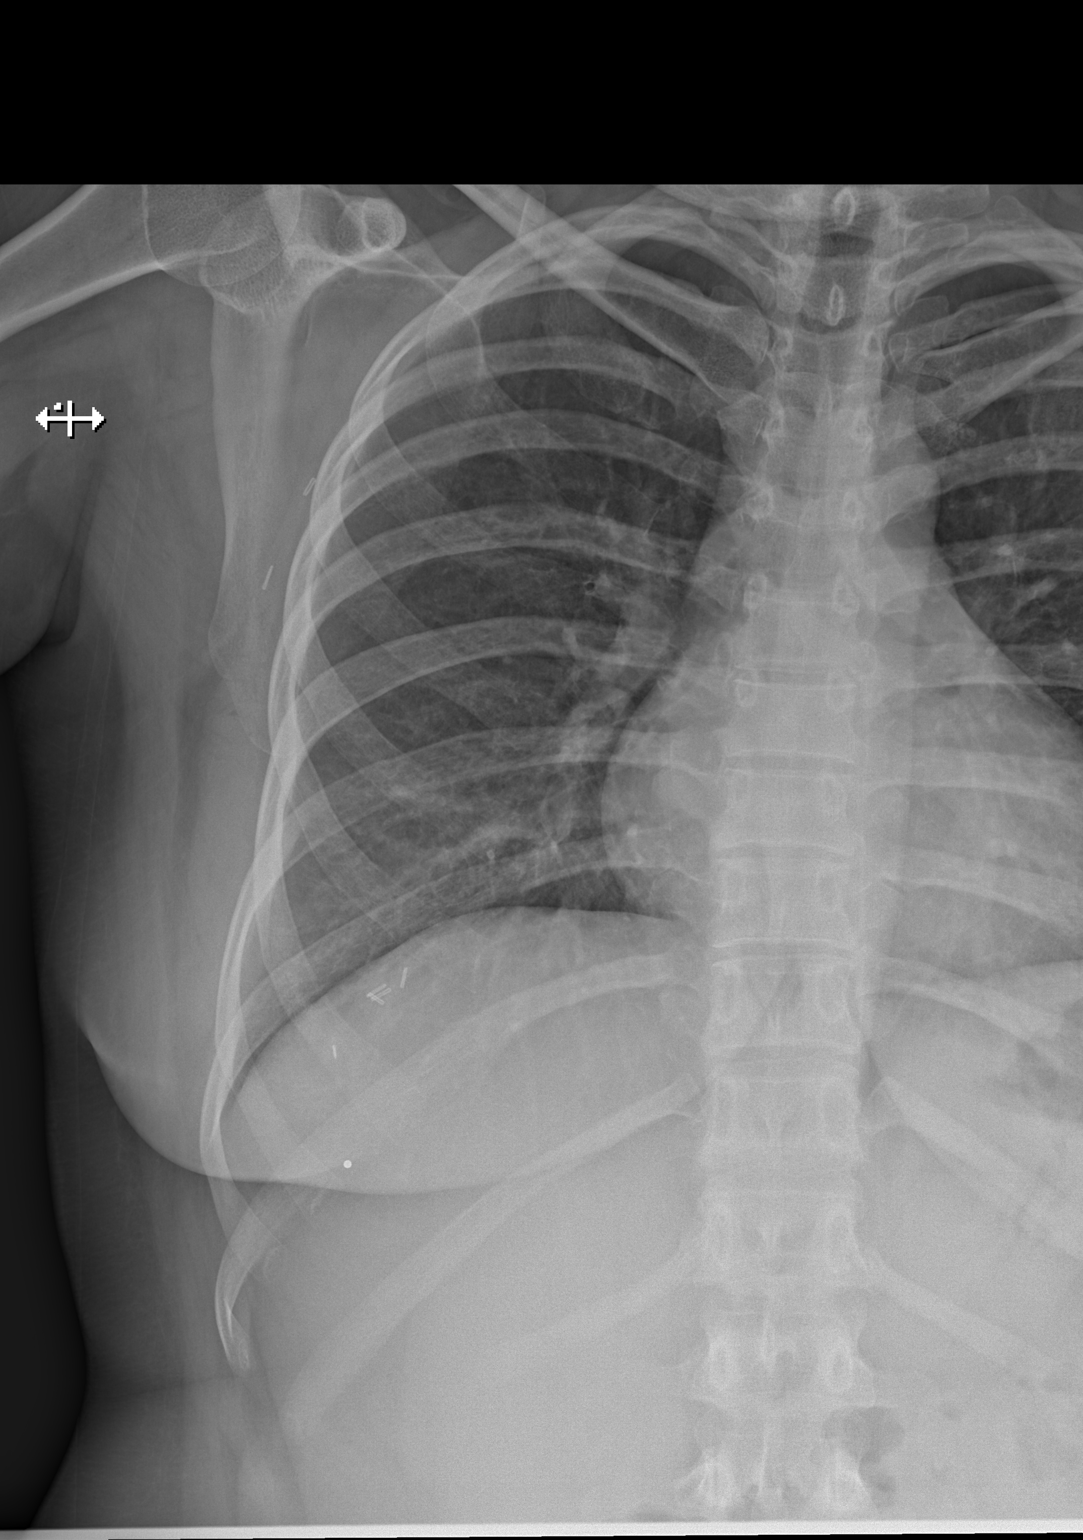

[w ribs obl right]
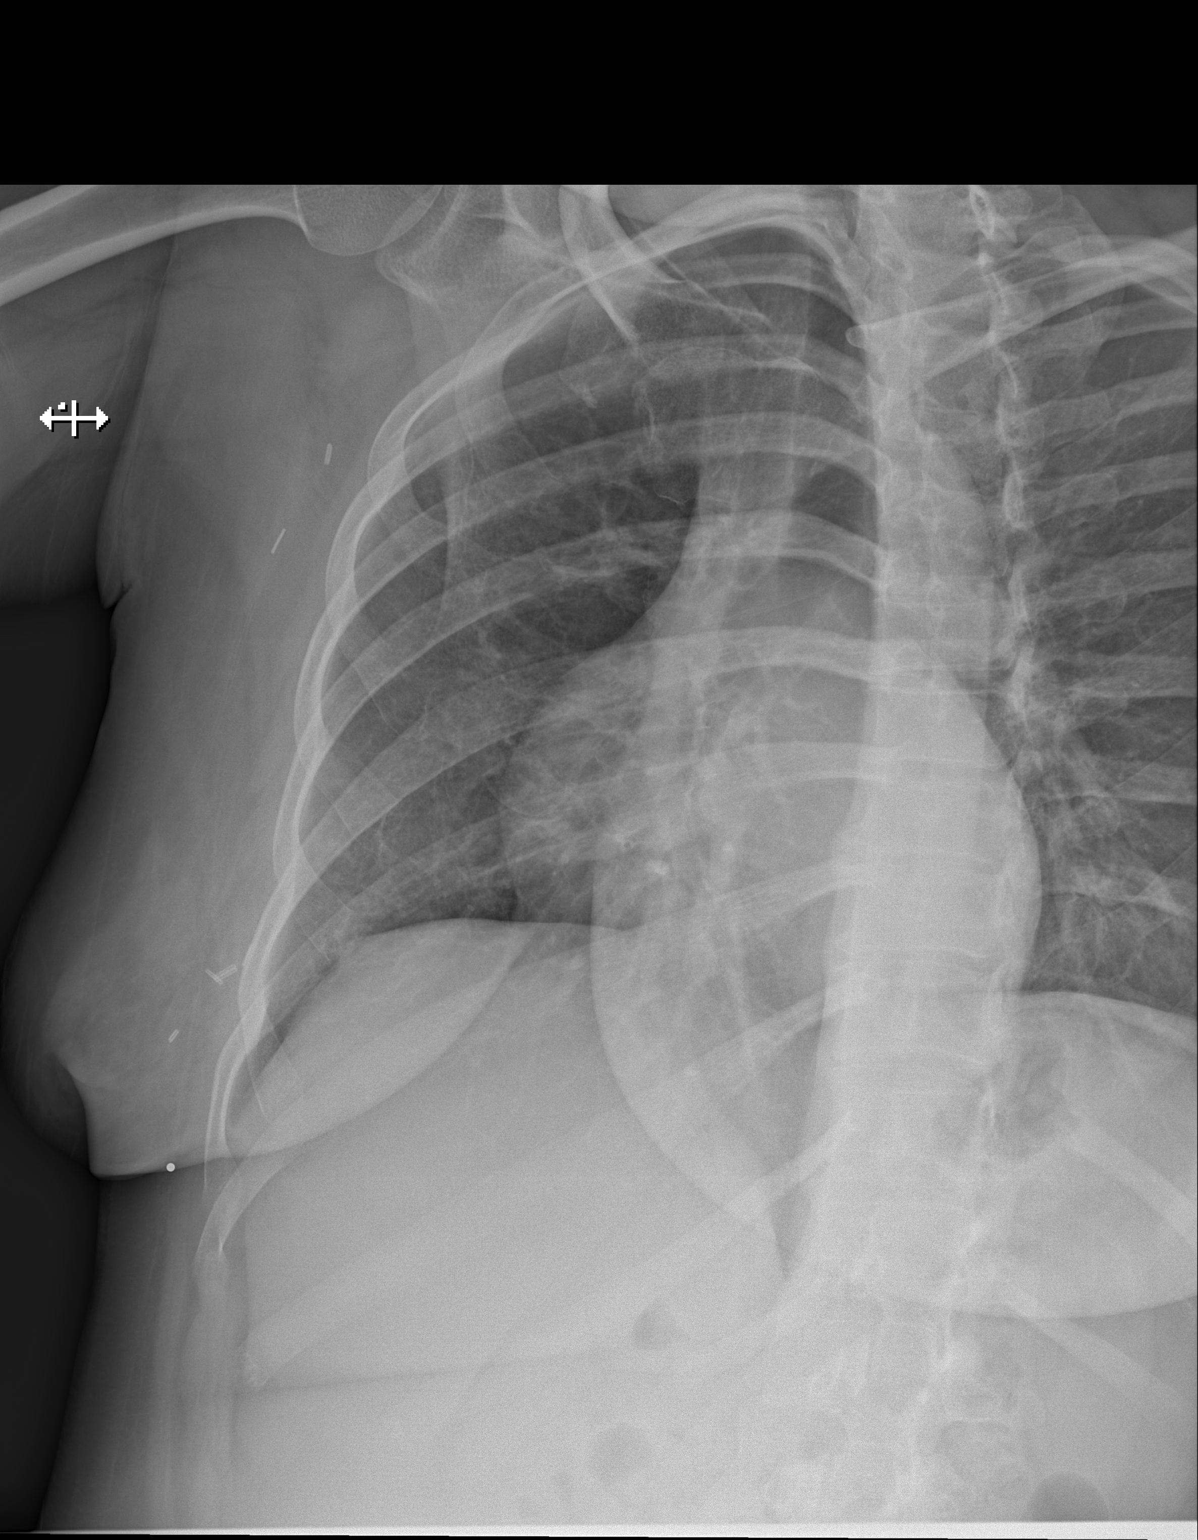

[3 of 3 positions shown; findings below may reference images not displayed]

FINDINGS: No fracture or other bone lesions are seen involving the ribs. There
is no evidence of pneumothorax or pleural effusion. Both lungs are
clear. Heart size and mediastinal contours are within normal limits.
IMPRESSION: Negative.

## 2021-10-31 MED ORDER — TRAMADOL HCL 50 MG PO TABS: 50.0000 mg | ORAL_TABLET | Freq: Four times a day (QID) | ORAL | 0 refills | Status: AC | PRN

## 2021-10-31 MED ORDER — OXYCODONE-ACETAMINOPHEN 5-325 MG PO TABS
1.0000 | ORAL_TABLET | Freq: Once | ORAL | Status: AC
  Administered 2021-10-31: 1 via ORAL
  Filled 2021-10-31: qty 1

## 2021-10-31 NOTE — ED Notes (Signed)
Urine cup provided.

## 2021-10-31 NOTE — Progress Notes (Signed)
Per Marlon Pel, RN, patient is no longer coming to [facility] for evaluation but will go to the ER. Ordered CBC and CMP discontinued.

## 2021-10-31 NOTE — Telephone Encounter (Signed)
This RN was contacted by the reception desk who stated this pt has come in without an appt " due to R breast pain ". Pt is asking to be seen.  Due to Dr Chryl Heck not in the office and presently [facility] has no openings - this RN inquired with Symptom Management who stated they could see the pt at 10 am .  Pt informed of above.

## 2021-10-31 NOTE — ED Provider Triage Note (Signed)
Emergency Medicine Provider Triage Evaluation Note  Brianna Owens , a 31 y.o. female  was evaluated in triage.  Pt complains of right lower chest pain/RUQ pain n that has been intermittent over the last month or 2 but has worsened significantly in the last day.  Pain is worse with deep inspiration, worse with palpation.  She denies nausea, vomiting, diarrhea, fevers.  No previous history of gallbladder disease.  No known trauma or injury.   Review of Systems  Positive: As above Negative: N/v/d  Physical Exam  BP (!) 143/98 (BP Location: Right Arm)   Pulse 66   Temp 98.1 F (36.7 C) (Oral)   Resp 18   SpO2 100%  Gen:   Awake, appears acutely uncomfortable Resp:  Normal effort  MSK:   Moves extremities without difficulty  Other:  Tender to palpation of the anterior rib just below the right breast.  Negative Murphy sign, negative CVA tenderness bilaterally.  Abdomen is otherwise soft nondistended.  Medical Decision Making  Medically screening exam initiated at 10:04 AM.  Appropriate orders placed.  Brianna Owens was informed that the remainder of the evaluation will be completed by another provider, this initial triage assessment does not replace that evaluation, and the importance of remaining in the ED until their evaluation is complete.     Tonye Pearson, [place] 10/31/21 1016

## 2021-10-31 NOTE — ED Notes (Signed)
Unable to collects labs at this time patient getting a ultrasound.

## 2021-10-31 NOTE — ED Triage Notes (Signed)
Pt c/o pain under right rib cage x 3 weeks got worse yesterday.

## 2021-10-31 NOTE — Discharge Instructions (Addendum)
Stay on a low-fat diet.  Call the gastroenterologist, Dr. Michail Sermon for follow-up appointment to be seen as soon as possible for further evaluation and treatment.

## 2021-10-31 NOTE — Telephone Encounter (Signed)
While pt was waiting to be seen - [place] interpreter informed this RN the pt is not having breast pain but epigastric pain which is severe- she is taking the pt to the ER now due to concern and likely not related to her history of breast cancer.  This RN informed [place].

## 2021-10-31 NOTE — ED Provider Notes (Addendum)
[facility] DEPT Provider Note   CSN: [id number] Arrival date & time: 10/31/21  0944     History  No chief complaint on file.   Brianna Owens is a 31 y.o. female.  HPI      She complains of right upper quadrant abdominal pain for 3 days, worsening despite rest.  She is not vomiting or having diarrhea.  She denies fever.  She has had similar pain in the past but not this bad.  She has not been evaluated for it previously.  Home Medications Prior to Admission medications   Medication Sig Start Date End Date Taking? Authorizing Provider  traMADol (ULTRAM) 50 MG tablet Take 1 tablet (50 mg total) by mouth every 6 (six) hours as needed for moderate pain or severe pain. 10/31/21  Yes Daleen Bo, MD  ibuprofen (ADVIL) 800 MG tablet Take 1 tablet (800 mg total) by mouth every 8 (eight) hours as needed. Patient not taking: Reported on 04/09/2021 11/17/19   Erroll Luna, MD  methocarbamol (ROBAXIN) 500 MG tablet Take 1 tablet (500 mg total) by mouth 4 (four) times daily. Patient not taking: Reported on 04/09/2021 09/18/20   Harle Stanford., PA-C      Allergies    Patient has no known allergies.    Review of Systems   Review of Systems  Physical Exam Updated Vital Signs BP (!) 153/103   Pulse 72   Temp 98.1 F (36.7 C) (Oral)   Resp 16   SpO2 100%  Physical Exam Vitals and nursing note reviewed.  Constitutional:      General: She is not in acute distress.    Appearance: She is well-developed. She is obese. She is not ill-appearing, toxic-appearing or diaphoretic.  HENT:     Head: Normocephalic and atraumatic.     Right Ear: External ear normal.     Left Ear: External ear normal.     Nose: No congestion.     Mouth/Throat:     Mouth: Mucous membranes are moist.  Eyes:     Conjunctiva/sclera: Conjunctivae normal.     Pupils: Pupils are equal, round, and reactive to light.  Neck:     Trachea: Phonation normal.   Cardiovascular:     Rate and Rhythm: Normal rate and regular rhythm.     Heart sounds: Normal heart sounds.  Pulmonary:     Effort: Pulmonary effort is normal.     Breath sounds: Normal breath sounds.  Abdominal:     General: There is no distension.     Palpations: Abdomen is soft.     Tenderness: There is abdominal tenderness (Right upper quadrant, mild).  Musculoskeletal:        General: Normal range of motion.     Cervical back: Normal range of motion and neck supple.  Skin:    General: Skin is warm and dry.  Neurological:     Mental Status: She is alert and oriented to person, place, and time.     Cranial Nerves: No cranial nerve deficit.     Sensory: No sensory deficit.     Motor: No abnormal muscle tone.     Coordination: Coordination normal.  Psychiatric:        Mood and Affect: Mood normal.        Behavior: Behavior normal.        Thought Content: Thought content normal.        Judgment: Judgment normal.    ED Results / Procedures /  Treatments   Labs (all labs ordered are listed, but only abnormal results are displayed) Labs Reviewed  COMPREHENSIVE METABOLIC PANEL - Abnormal; Notable for the following components:      Result Value   Glucose, Bld 107 (*)    AST 78 (*)    ALT 142 (*)    All other components within normal limits  URINALYSIS, ROUTINE W REFLEX MICROSCOPIC - Abnormal; Notable for the following components:   APPearance CLOUDY (*)    Leukocytes,Ua MODERATE (*)    Bacteria, UA FEW (*)    All other components within normal limits  CBC WITH DIFFERENTIAL/PLATELET  HCG, SERUM, QUALITATIVE    EKG None  Radiology DG Ribs Unilateral W/Chest Right  Result Date: 10/31/2021 CLINICAL DATA:  Right rib pain EXAM: RIGHT RIBS AND CHEST - 3+ VIEW COMPARISON:  None Available. FINDINGS: No fracture or other bone lesions are seen involving the ribs. There is no evidence of pneumothorax or pleural effusion. Both lungs are clear. Heart size and mediastinal contours  are within normal limits. IMPRESSION: Negative. Electronically Signed   By: Franchot Gallo M.D.   On: 10/31/2021 11:26   US Abdomen Limited RUQ (LIVER/GB)  Result Date: 10/31/2021 CLINICAL DATA:  RIGHT upper quadrant pain for 3 weeks. EXAM: ULTRASOUND ABDOMEN LIMITED RIGHT UPPER QUADRANT COMPARISON:  Ultrasound evaluation July 30, 2021. FINDINGS: Gallbladder: Small amount of sludge in the gallbladder. No signs of tenderness over the gallbladder during the examination. No wall thickening or pericholecystic fluid. Common bile duct: Diameter: 3.0 mm Liver: Heterogeneous predominantly echogenic liver without focal abnormality visualized. Portal vein is patent on color Doppler imaging with normal direction of blood flow towards the liver. Other: Mildly limited exam due to patient breathing and motion. IMPRESSION: No acute biliary findings, small amount of sludge in the gallbladder. Heterogeneous predominantly echogenic parenchymal pattern in the liver compatible with at least moderate steatosis. Would correlate with any clinical or laboratory evidence of liver disease and or steatohepatitis, no specific imaging findings to suggest this, though may warrant assessment given heterogeneity and mildly coarsened echotexture on today's exam. Electronically Signed   By: Zetta Bills M.D.   On: 10/31/2021 10:47    Procedures Procedures    Medications Ordered in ED Medications  oxyCODONE-acetaminophen (PERCOCET/ROXICET) 5-325 MG per tablet 1 tablet (1 tablet Oral Given 10/31/21 1506)    ED Course/ Medical Decision Making/ A&P                           Medical Decision Making She is presenting for evaluation of right upper quadrant pain, started without trauma or other symptoms associated.  She describes the pain as moderate, and appears comfortable.  Problems Addressed: Gallbladder disease: complicated acute illness or injury with systemic symptoms    Details: Nonspecific findings likely consistent with  sludge on ultrasound imaging and accompanied by minimal transaminitis Hepatic steatosis: self-limited or minor problem    Details: Incidental finding on ultrasound imaging, not likely to cause pain Right upper quadrant abdominal pain: acute illness or injury  Amount and/or Complexity of Data Reviewed Independent Historian:     Details: Cogent historian, interviewed using Spanish language translator External Data Reviewed:     Details: Most recent oncology office visit, 10/22/2021.  Follow-up for breast cancer status post lobectomy, chemotherapy and radiation treatments.  She is being managed with Lupron, every 28 days. Labs: ordered.    Details: CBC, metabolic panel, pregnancy test, urinalysis-normal except mild transaminitis, and abnormal urine,  which is most consistent with contaminated sample Radiology: ordered and independent interpretation performed.    Details: Chest x-ray with right rib detail, right upper quadrant abdominal ultrasound to evaluate liver and gallbladder-no fracture.  Gallbladder sludge present without gallstones.  No thickening of the gallbladder wall.  Hepatic steatosis is present.  Risk Prescription drug management. Decision regarding hospitalization. Risk Details: She presents for evaluation of persistent abdominal pain right upper quadrant, with prior history of same, not previously evaluated.  Evaluation today indicates mild gallbladder disease with hepatic steatosis and mild transaminitis.  Patient is not septic or in severe pain.  She can be managed symptomatically as an outpatient with low-fat diet and referral to GI for further evaluation of these problems.  She will be given prescription for tramadol and instructions on care for these problems.  She is discharged in stable condition.  She does not require hospitalization or further ED intervention at this time.           Final Clinical Impression(s) / ED Diagnoses Final diagnoses:  Right upper quadrant  abdominal pain  Gallbladder disease  Hepatic steatosis    Rx / DC Orders ED Discharge Orders          Ordered    traMADol (ULTRAM) 50 MG tablet  Every 6 hours PRN        10/31/21 1732                Daleen Bo, MD 10/31/21 [phone]

## 2021-11-01 ENCOUNTER — Other Ambulatory Visit: Payer: Self-pay | Admitting: *Deleted

## 2021-11-04 ENCOUNTER — Other Ambulatory Visit: Payer: Self-pay

## 2021-11-04 DIAGNOSIS — Z171 Estrogen receptor negative status [ER-]: Secondary | ICD-10-CM

## 2021-11-04 NOTE — Progress Notes (Signed)
[name], interpreter called this morning regarding pt experiencing right chest pain. Pt has been to [facility] ED, [facility] ED, and [facility] ED for evaluation of pain. Per CT abd/pelvis at [facility], Mendel Ryder, NP would like to see pt at [facility] 5/23 at 1130 lab then 1215 NP visit. [name], interpreter understands and will relay this information to pt.

## 2021-11-05 ENCOUNTER — Inpatient Hospital Stay (HOSPITAL_BASED_OUTPATIENT_CLINIC_OR_DEPARTMENT_OTHER): Payer: No Typology Code available for payment source | Admitting: Adult Health

## 2021-11-05 ENCOUNTER — Inpatient Hospital Stay: Payer: No Typology Code available for payment source

## 2021-11-05 ENCOUNTER — Encounter: Payer: Self-pay | Admitting: Adult Health

## 2021-11-05 ENCOUNTER — Other Ambulatory Visit: Payer: Self-pay

## 2021-11-05 VITALS — BP 118/73 | HR 71 | Temp 98.0°F | Resp 18 | Ht 61.0 in | Wt 174.0 lb

## 2021-11-05 DIAGNOSIS — Z803 Family history of malignant neoplasm of breast: Secondary | ICD-10-CM

## 2021-11-05 DIAGNOSIS — Z171 Estrogen receptor negative status [ER-]: Secondary | ICD-10-CM

## 2021-11-05 DIAGNOSIS — C50811 Malignant neoplasm of overlapping sites of right female breast: Secondary | ICD-10-CM

## 2021-11-05 LAB — CBC WITH DIFFERENTIAL (CANCER CENTER ONLY)
Abs Immature Granulocytes: 0.02 10*3/uL (ref 0.00–0.07)
Basophils Absolute: 0 10*3/uL (ref 0.0–0.1)
Basophils Relative: 1 %
Eosinophils Absolute: 0.2 10*3/uL (ref 0.0–0.5)
Eosinophils Relative: 3 %
HCT: 35.3 % — ABNORMAL LOW (ref 36.0–46.0)
Hemoglobin: 12.4 g/dL (ref 12.0–15.0)
Immature Granulocytes: 0 %
Lymphocytes Relative: 32 %
Lymphs Abs: 2.5 10*3/uL (ref 0.7–4.0)
MCH: 31.6 pg (ref 26.0–34.0)
MCHC: 35.1 g/dL (ref 30.0–36.0)
MCV: 89.8 fL (ref 80.0–100.0)
Monocytes Absolute: 0.6 10*3/uL (ref 0.1–1.0)
Monocytes Relative: 8 %
Neutro Abs: 4.5 10*3/uL (ref 1.7–7.7)
Neutrophils Relative %: 56 %
Platelet Count: 237 10*3/uL (ref 150–400)
RBC: 3.93 MIL/uL (ref 3.87–5.11)
RDW: 12 % (ref 11.5–15.5)
WBC Count: 7.9 10*3/uL (ref 4.0–10.5)
nRBC: 0 % (ref 0.0–0.2)

## 2021-11-05 LAB — CMP (CANCER CENTER ONLY)
ALT: 156 U/L — ABNORMAL HIGH (ref 0–44)
AST: 68 U/L — ABNORMAL HIGH (ref 15–41)
Albumin: 4.3 g/dL (ref 3.5–5.0)
Alkaline Phosphatase: 101 U/L (ref 38–126)
Anion gap: 6 (ref 5–15)
BUN: 9 mg/dL (ref 6–20)
CO2: 28 mmol/L (ref 22–32)
Calcium: 10 mg/dL (ref 8.9–10.3)
Chloride: 104 mmol/L (ref 98–111)
Creatinine: 0.49 mg/dL (ref 0.44–1.00)
GFR, Estimated: >60 mL/min (ref >60)
Glucose, Bld: 104 mg/dL — ABNORMAL HIGH (ref 70–99)
Potassium: 3.8 mmol/L (ref 3.5–5.1)
Sodium: 138 mmol/L (ref 135–145)
Total Bilirubin: 0.3 mg/dL (ref 0.3–1.2)
Total Protein: 8.1 g/dL (ref 6.5–8.1)

## 2021-11-05 NOTE — Progress Notes (Signed)
[facility] Follow up:    Pcp, No No address on file   DIAGNOSIS:  Cancer Staging  No matching staging information was found for the patient.  SUMMARY OF ONCOLOGIC HISTORY: 31 y.o. BRCA1 positive Brianna Owens woman status post right breast overlapping sites biopsy 05/04/2019 for a clinical T3 N0, stage IIIB invasive ductal carcinoma, grade 2, triple negative, with an MIB-1 of 40%.             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  The Common Hereditary Gene Panel offered by [facility] includes sequencing and/or deletion duplication testing of the following 48 genes: APC, ATM, AXIN2, BARD1, BMPR1A, BRCA1, BRCA2, BRIP1, CDH1, CDK4, CDKN2A (p14ARF), CDKN2A (p16INK4a), CHEK2, CTNNA1, DICER1, EPCAM (Deletion/duplication testing only), GREM1 (promoter region deletion/duplication testing only), KIT, MEN1, MLH1, MSH2, MSH3, MSH6, MUTYH, NBN, NF1, NHTL1, PALB2, PDGFRA, PMS2, POLD1, POLE, PTEN, RAD50, RAD51C, RAD51D, RNF43, SDHB, SDHC, SDHD, SMAD4, SMARCA4. STK11, TP53, TSC1, TSC2, and VHL.  The following genes were evaluated for sequence changes only: SDHA and HOXB13 c.251G>A variant only. The report date is 05/24/2019.             (b) recommended bilateral mastectomies, due to potential barriers/risks with age and intensified screening   (2) neoadjuvant chemotherapy consisting of doxorubicin and cyclophosphamide in dose dense fashion x4 started 05/26/2019, completed 07/07/2019, followed by paclitaxel and carboplatin weekly x12 started 07/21/2019             (a) breast MRI 09/26/2019 shows a complete radiologic response   (3) status post right lumpectomy 11/17/2019 showing a complete pathologic response [ypT0 ypN0]             (a) a total of 5 axillary lymph nodes were removed   (4)  adjuvant radiation   Radiation Treatment Dates: 01/02/2020 through 02/15/2020 Site Technique Total Dose (Gy) Dose per Fx (Gy) Completed Fx Beam Energies  Breast, Right: Breast_Rt 3D 50.4/50.4 1.8 28/28 6X, 10X  Breast, Right: Breast_Rt_Bst 3D 10/10 2 5/5 6X  Sclav-RT: SCV_Rt 3D 50.4/50.4 1.8 28/28 6X, 10X      (5) considering bilateral salpingo-oophorectomy for ovarian cancer prevention             (a) receiving leuprolide/Lupron every 28 days   CURRENT THERAPY: observation  INTERVAL HISTORY: Brianna Owens 31 y.o. female returns for f/u of RUQ pain.  She is accompanied by her Spanish interpreter Brianna Owens.  This pain is constant.  She has no nausea.  She is taking tramadol for this pain which helps.  She tells me she does not have a refill..  She says the pain began last Thursday and has not stopped.  There was no precipitating event, she did not fall or get hit.    She was evaluated on 10/31/2021 at [facility], xray noted no fracture and small amount of gall bladder sludge.  She is scheduled to see GI next week on 11/14/2021.  She was seen at [facility] on 11/01/2021 and underwent CT abd/pelvis that noted a right anterolateral 7th rib pathologic fracture.     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  MEDICAL HISTORY: Past Medical History:  Diagnosis Date   Breast cancer ([place])    Cancer ([place]) 05/05/2019   Personal history of chemotherapy    Personal history of radiation therapy     SURGICAL HISTORY: Past Surgical History:  Procedure Laterality Date   BREAST BIOPSY      BREAST LUMPECTOMY     BREAST LUMPECTOMY WITH RADIOACTIVE SEED AND SENTINEL LYMPH NODE BIOPSY Right 11/17/2019   Procedure: RIGHT BREAST LUMPECTOMY WITH RADIOACTIVE SEED;  Surgeon: Erroll Luna, MD;  Location: [place];  Service: General;  Laterality: Right;  PEC BLOCK   IR IMAGING GUIDED PORT INSERTION  05/24/2019   IR REMOVAL TUN ACCESS W/ PORT W/O FL MOD SED  07/18/2020   SENTINEL NODE BIOPSY Right 11/17/2019   Procedure: Sentinel Node Biopsy;  Surgeon: Erroll Luna, MD;  Location: [place];  Service: General;  Laterality: Right;    SOCIAL HISTORY: Social History   Socioeconomic History   Marital status: Single    Spouse name: Not on file   Number of children: Not on file   Years of education: Not on file   Highest education level: 9th grade  Occupational History   Not on file  Tobacco Use   Smoking status: Never   Smokeless tobacco: Never  Vaping Use   Vaping Use: Never used  Substance and Sexual Activity   Alcohol use: Not Currently   Drug use: Not Currently   Sexual activity: Yes    Birth control/protection: I.U.D.  Other Topics Concern   Not on file  Social History Narrative   Not on file   Social Determinants of Health   Financial Resource Strain: Not on file  Food Insecurity: Food Insecurity Present   Worried About [place] in the Last Year: Often true   Ran Out of Food in the Last Year: Often true  Transportation Needs: No Transportation Needs   Lack of Transportation (Medical): No   Lack of Transportation (Non-Medical): No  Physical Activity: Not on file  Stress: Not on file  Social Connections: Not on file  Intimate Partner Violence: Not on file    FAMILY HISTORY: Family History  Problem Relation Age of Onset   Breast cancer Mother    Breast cancer Maternal Aunt     Review of Systems  Constitutional:  Negative for appetite change, chills, fatigue, fever and unexpected weight change.  HENT:   Negative for hearing loss, lump/mass and trouble  swallowing.   Eyes:  Negative for eye problems and icterus.  Respiratory:  Negative for chest tightness, cough and shortness of breath.   Cardiovascular:  Negative for chest pain, leg swelling and palpitations.  Gastrointestinal:  Negative for abdominal distention, abdominal pain, constipation, diarrhea, nausea and vomiting.  Endocrine: Negative for hot flashes.  Genitourinary:  Negative for difficulty urinating.   Musculoskeletal:  Negative for arthralgias.  Skin:  Negative for itching and rash.  Neurological:  Negative for dizziness, extremity weakness, headaches and numbness.  Hematological:  Negative for adenopathy. Does not bruise/bleed easily.  Psychiatric/Behavioral:  Negative for depression. The patient is not nervous/anxious.      PHYSICAL EXAMINATION  ECOG PERFORMANCE STATUS: 1 - Symptomatic but completely ambulatory  Vitals:   11/05/21 1210  BP: 118/73  Pulse: 71  Resp: 18  Temp: 98 F (36.7 C)  SpO2: 100%    Physical Exam Constitutional:      General: She is not in acute distress.    Appearance: Normal appearance. She is not toxic-appearing.  HENT:     Head: Normocephalic and atraumatic.  Eyes:     General: No scleral icterus. Cardiovascular:     Rate and Rhythm: Normal rate and regular rhythm.     Pulses: Normal pulses.     Heart sounds: Normal heart sounds.  Pulmonary:     Effort: Pulmonary effort is normal.     Breath sounds: Normal breath sounds.  Chest:     Comments: Right 7th rib +TTP Abdominal:     General: Abdomen is flat. Bowel sounds are normal. There is no distension.     Palpations: Abdomen is soft.     Tenderness: There is no abdominal tenderness.  Musculoskeletal:        General: No swelling.     Cervical back: Neck supple.  Lymphadenopathy:     Cervical: No cervical adenopathy.  Skin:    General: Skin is warm and dry.     Findings: No rash.  Neurological:     General: No focal deficit present.     Mental Status: She is alert.   Psychiatric:        Mood and Affect: Mood normal.        Behavior: Behavior normal.    LABORATORY DATA:  CBC    Component Value Date/Time   WBC 7.9 11/05/2021 1203   WBC 8.5 10/31/2021 1101   RBC 3.93 11/05/2021 1203   HGB 12.4 11/05/2021 1203   HCT 35.3 (L) 11/05/2021 1203   PLT 237 11/05/2021 1203   MCV 89.8 11/05/2021 1203   MCH 31.6 11/05/2021 1203   MCHC 35.1 11/05/2021 1203   RDW 12.0 11/05/2021 1203   LYMPHSABS 2.5 11/05/2021 1203   MONOABS 0.6 11/05/2021 1203   EOSABS 0.2 11/05/2021 1203   BASOSABS 0.0 11/05/2021 1203    CMP     Component Value Date/Time   NA 138 11/05/2021 1203   K 3.8 11/05/2021 1203   CL 104 11/05/2021 1203   CO2 28 11/05/2021 1203   GLUCOSE 104 (H) 11/05/2021 1203   BUN 9 11/05/2021 1203   CREATININE 0.49 11/05/2021 1203   CALCIUM 10.0 11/05/2021 1203   PROT 8.1 11/05/2021 1203   ALBUMIN 4.3 11/05/2021 1203   AST 68 (H) 11/05/2021 1203   ALT 156 (H) 11/05/2021 1203   ALKPHOS 101 11/05/2021 1203   BILITOT 0.3 11/05/2021 1203   GFRNONAA >60 11/05/2021 1203   GFRAA >60 03/16/2020 1011   GFRAA >60 07/21/2019 1025    ASSESSMENT and THERAPY PLAN:   Malignant neoplasm of overlapping sites of right breast in female, estrogen receptor negative [facility]) The patient is a 31 year old Spanish-speaking woman with 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  She is here today with a new pathologic fracture of her right seventh anterolateral rib.  This was not seen on the x-ray imaging that was completed at the [facility].  To further evaluate this we will do a complete bone scan.  Other lesions noted on her CT scan imaging.  Considering her increasing liver enzymes and the appearance of hepatic steatosis on imaging she is following up with GI next week to discuss this concern.  I discussed with  the patient that the  imaging could indicate that there was cancer in the bone that caused the fracture, however she did receive radiation in that area and it could be a late effect from the radiation treatment.  We will get the imaging and we will see her back 2 weeks after or sooner if needed.  I offered to refill her tramadol today however she is tells me that she still has plenty of this left.   All questions were answered. The patient knows to call the clinic with any problems, questions or concerns. We can certainly see the patient much sooner if necessary.  Total encounter time:30 minutes*in face-to-face visit time, chart review, lab review, care coordination, order entry, and documentation of the encounter time.    Wilber Bihari, NP 11/05/21 1:56 PM Medical Oncology and Hematology [facility] [place], [place] [address] Tel. [phone]    Fax. [phone]  *Total Encounter Time as defined by the Centers for Medicare and Medicaid Services includes, in addition to the face-to-face time of a patient visit (documented in the note above) non-face-to-face time: obtaining and reviewing outside history, ordering and reviewing medications, tests or procedures, care coordination (communications with other health care professionals or caregivers) and documentation in the medical record.

## 2021-11-05 NOTE — Assessment & Plan Note (Signed)
The patient is a 31 year old Spanish-speaking woman with 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  She is here today with a new pathologic fracture of her right seventh anterolateral rib.  This was not seen on the x-ray imaging that was completed at the [facility] facility.  To further evaluate this we will do a complete bone scan.  Other lesions noted on her CT scan imaging.  Considering her increasing liver enzymes and the appearance of hepatic steatosis on imaging she is following up with GI next week to discuss this concern.  I discussed with the patient that the imaging could indicate that there was cancer in the bone that caused the fracture, however she did receive radiation in that area and it could be a late effect from the radiation treatment.  We will get the imaging and we will see her back 2 weeks after or sooner if needed.  I offered to refill her tramadol today however she is tells me that she still has plenty of this left.

## 2021-11-06 LAB — CANCER ANTIGEN 27.29: CA 27.29: <9 U/mL (ref 0.0–38.6)

## 2021-11-12 ENCOUNTER — Telehealth: Payer: Self-pay | Admitting: Adult Health

## 2021-11-12 NOTE — Telephone Encounter (Signed)
Scheduled appointment per 5/23 los. Patient is aware. Called the patient using the [employer] provided to [place] [phone]

## 2021-11-18 ENCOUNTER — Encounter (HOSPITAL_COMMUNITY)
Admission: RE | Admit: 2021-11-18 | Discharge: 2021-11-18 | Disposition: A | Discharge status: Home / Self Care | Payer: No Typology Code available for payment source | Source: Ambulatory Visit | Attending: Adult Health | Admitting: Adult Health

## 2021-11-18 ENCOUNTER — Encounter (HOSPITAL_COMMUNITY)
Admission: RE | Admit: 2021-11-18 | Discharge: 2021-11-18 | Disposition: A | Discharge status: Home / Self Care | Payer: Self-pay | Source: Ambulatory Visit | Attending: Adult Health | Admitting: Adult Health

## 2021-11-18 DIAGNOSIS — Z171 Estrogen receptor negative status [ER-]: Secondary | ICD-10-CM | POA: Insufficient documentation

## 2021-11-18 DIAGNOSIS — C50811 Malignant neoplasm of overlapping sites of right female breast: Secondary | ICD-10-CM | POA: Insufficient documentation

## 2021-11-18 IMAGING — NM NM BONE WHOLE BODY
2 series · 2 of 2 positions shown · non-contrast
Comparison: [DATE] bone scan, rib x-ray [DATE]

CLINICAL DATA: Stage IV right breast cancer.  Initial workup.

EXAM:
NUCLEAR MEDICINE WHOLE BODY BONE SCAN
TECHNIQUE: Whole body anterior and posterior images were obtained approximately
3 hours after intravenous injection of radiopharmaceutical.
RADIOPHARMACEUTICALS:  21.6 mCi [95] MDP IV

[Series 1: whole body · 2.66mm/px · 1 of 1 slices shown (1 of 2)]
[im 1/1]
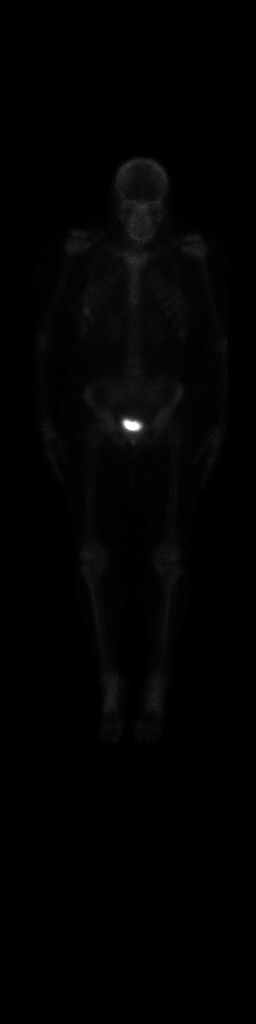

[Series 1: whole body · 2.66mm/px · 1 of 1 slices shown (2 of 2)]
[im 1/1]
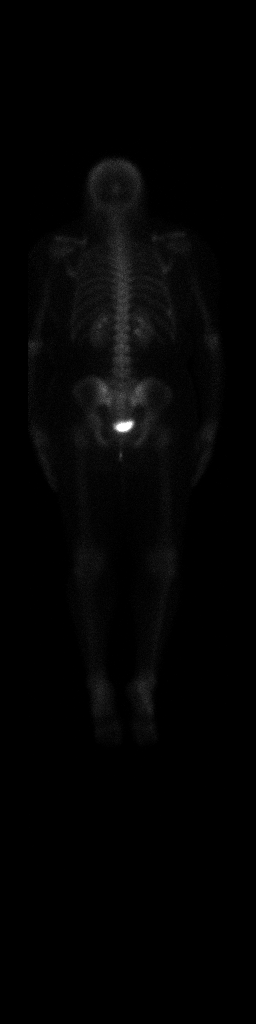

[2 of 2 positions shown; findings below may reference images not displayed]

FINDINGS: Focal radiotracer uptake is identified in the lateral right tenth
rib new compared to prior bone scan suspicious for bone metastasis.

No other foci of abnormal increased radiotracer uptake is
identified.
IMPRESSION: Focal radiotracer uptake is identified in the lateral right tenth
rib new compared to prior bone scan suspicious for bone metastasis.

## 2021-11-18 MED ORDER — TECHNETIUM TC 99M MEDRONATE IV KIT
21.6000 | PACK | Freq: Once | INTRAVENOUS | Status: AC | PRN
  Administered 2021-11-18: 21.6 via INTRAVENOUS

## 2021-11-19 ENCOUNTER — Inpatient Hospital Stay: Payer: No Typology Code available for payment source

## 2021-11-19 ENCOUNTER — Other Ambulatory Visit: Payer: Self-pay

## 2021-11-19 ENCOUNTER — Inpatient Hospital Stay: Payer: No Typology Code available for payment source | Attending: Oncology | Admitting: Hematology and Oncology

## 2021-11-19 ENCOUNTER — Encounter: Payer: Self-pay | Admitting: Hematology and Oncology

## 2021-11-19 DIAGNOSIS — Z171 Estrogen receptor negative status [ER-]: Secondary | ICD-10-CM | POA: Insufficient documentation

## 2021-11-19 DIAGNOSIS — Z803 Family history of malignant neoplasm of breast: Secondary | ICD-10-CM | POA: Insufficient documentation

## 2021-11-19 DIAGNOSIS — C50811 Malignant neoplasm of overlapping sites of right female breast: Secondary | ICD-10-CM

## 2021-11-19 DIAGNOSIS — Z01419 Encounter for gynecological examination (general) (routine) without abnormal findings: Secondary | ICD-10-CM

## 2021-11-19 DIAGNOSIS — Z95828 Presence of other vascular implants and grafts: Secondary | ICD-10-CM

## 2021-11-19 MED ORDER — LEUPROLIDE ACETATE 3.75 MG IM KIT
3.7500 mg | PACK | Freq: Once | INTRAMUSCULAR | Status: AC
  Administered 2021-11-19: 3.75 mg via INTRAMUSCULAR
  Filled 2021-11-19: qty 3.75

## 2021-11-19 NOTE — Progress Notes (Signed)
[facility] Follow up:    Pcp, No No address on file   DIAGNOSIS:  Cancer Staging  No matching staging information was found for the patient.  SUMMARY OF ONCOLOGIC HISTORY: 31 y.o. BRCA1 positive Brianna Owens woman status post right breast overlapping sites biopsy 05/04/2019 for a clinical T3 N0, stage IIIB invasive ductal carcinoma, grade 2, triple negative, with an MIB-1 of 40%.             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  The Common Hereditary Gene Panel offered by [facility] includes sequencing and/or deletion duplication testing of the following 48 genes: APC, ATM, AXIN2, BARD1, BMPR1A, BRCA1, BRCA2, BRIP1, CDH1, CDK4, CDKN2A (p14ARF), CDKN2A (p16INK4a), CHEK2, CTNNA1, DICER1, EPCAM (Deletion/duplication testing only), GREM1 (promoter region deletion/duplication testing only), KIT, MEN1, MLH1, MSH2, MSH3, MSH6, MUTYH, NBN, NF1, NHTL1, PALB2, PDGFRA, PMS2, POLD1, POLE, PTEN, RAD50, RAD51C, RAD51D, RNF43, SDHB, SDHC, SDHD, SMAD4, SMARCA4. STK11, TP53, TSC1, TSC2, and VHL.  The following genes were evaluated for sequence changes only: SDHA and HOXB13 c.251G>A variant only. The report date is 05/24/2019.             (b) recommended bilateral mastectomies, due to potential barriers/risks with age and intensified screening   (2) neoadjuvant chemotherapy consisting of doxorubicin and cyclophosphamide in dose dense fashion x4 started 05/26/2019, completed 07/07/2019, followed by paclitaxel and carboplatin weekly x12 started 07/21/2019             (a) breast MRI 09/26/2019 shows a complete radiologic response   (3) status post right lumpectomy 11/17/2019 showing a complete pathologic response [ypT0 ypN0]             (a) a total of 5 axillary lymph nodes were removed   (4)  adjuvant radiation   Radiation Treatment Dates: 01/02/2020 through 02/15/2020 Site Technique Total Dose (Gy) Dose per Fx (Gy) Completed Fx Beam Energies  Breast, Right: Breast_Rt 3D 50.4/50.4 1.8 28/28 6X, 10X  Breast, Right: Breast_Rt_Bst 3D 10/10 2 5/5 6X  Sclav-RT: SCV_Rt 3D 50.4/50.4 1.8 28/28 6X, 10X      (5) considering bilateral salpingo-oophorectomy for ovarian cancer prevention             (a) receiving leuprolide/Lupron every 28 days   CURRENT THERAPY: observation  INTERVAL HISTORY: Brianna Owens 31 y.o. female returns for f/u of RUQ pain.   She was evaluated on 10/31/2021 at the [facility] ER, xray noted no fracture and small amount of gall bladder sludge.  She is scheduled to see GI next week on 11/14/2021.  She was seen at [facility] on 11/01/2021 and underwent CT abd/pelvis that noted a right anterolateral 7th rib pathologic fracture.   She is here to review bone scan results which also appears to have noted some focal radiotracer uptake identified in the lateral right 10th rib new compared to prior bone scan suspicious for bone metastasis. She is here for follow-up to discuss bone scan results. Since she last came, the pain is actually better and no other symptoms. She today mentions that she actually fell on the right side and hurt her ribs, tripped and had a mechanical fall. No cough, chest pain, SOB, change in bowel habits. No trouble urinating. No headaches, double vision, balance changes.  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  MEDICAL HISTORY: Past Medical  History:  Diagnosis Date   Breast cancer ([place])    Cancer ([place]) 05/05/2019   Personal history of chemotherapy    Personal history of radiation therapy     SURGICAL HISTORY: Past Surgical History:  Procedure Laterality Date   BREAST BIOPSY     BREAST LUMPECTOMY     BREAST LUMPECTOMY WITH RADIOACTIVE SEED AND SENTINEL LYMPH NODE BIOPSY Right 11/17/2019   Procedure: RIGHT BREAST LUMPECTOMY WITH RADIOACTIVE SEED;  Surgeon: Erroll Luna, MD;  Location: [place];  Service: General;  Laterality: Right;  PEC BLOCK   IR IMAGING GUIDED PORT INSERTION  05/24/2019   IR REMOVAL TUN ACCESS W/ PORT W/O FL MOD SED  07/18/2020   SENTINEL NODE BIOPSY Right 11/17/2019   Procedure: Sentinel Node Biopsy;  Surgeon: Erroll Luna, MD;  Location: [place];  Service: General;  Laterality: Right;    SOCIAL HISTORY: Social History   Socioeconomic History   Marital status: Single    Spouse name: Not on file   Number of children: Not on file   Years of education: Not on file   Highest education level: 9th grade  Occupational History   Not on file  Tobacco Use   Smoking status: Never   Smokeless tobacco: Never  Vaping Use   Vaping Use: Never used  Substance and Sexual Activity   Alcohol use: Not Currently   Drug use: Not Currently   Sexual activity: Yes    Birth control/protection: I.U.D.  Other Topics Concern   Not on file  Social History Narrative   Not on file   Social Determinants of Health   Financial Resource Strain: Not on file  Food Insecurity: Food Insecurity Present   Worried About [place] in the Last Year: Often true   Ran Out of Food in the Last Year: Often true  Transportation Needs: No Transportation Needs   Lack of Transportation (Medical): No   Lack of Transportation (Non-Medical): No  Physical Activity: Not on file  Stress: Not on file  Social Connections: Not on file  Intimate Partner Violence: Not on file    FAMILY HISTORY: Family History  Problem Relation Age  of Onset   Breast cancer Mother    Breast cancer Maternal Aunt     Review of Systems  Constitutional:  Negative for appetite change, chills, fatigue, fever and unexpected weight change.  HENT:   Negative for hearing loss, lump/mass and trouble swallowing.   Eyes:  Negative for eye problems and icterus.  Respiratory:  Negative for chest tightness, cough and shortness of breath.   Cardiovascular:  Negative for chest pain, leg swelling and palpitations.  Gastrointestinal:  Negative for abdominal distention, abdominal pain, constipation, diarrhea, nausea and vomiting.  Endocrine: Negative for hot flashes.  Genitourinary:  Negative for difficulty urinating.   Musculoskeletal:  Negative for arthralgias.  Skin:  Negative for itching and rash.  Neurological:  Negative for dizziness, extremity weakness, headaches and numbness.  Hematological:  Negative for adenopathy. Does not bruise/bleed easily.  Psychiatric/Behavioral:  Negative for depression.  The patient is not nervous/anxious.      PHYSICAL EXAMINATION  ECOG PERFORMANCE STATUS: 1 - Symptomatic but completely ambulatory  Vitals:   11/19/21 1411  BP: (!) 137/92  Pulse: (!) 106  Resp: 18  Temp: 97.9 F (36.6 C)  SpO2: 100%     Physical Exam Constitutional:      General: She is not in acute distress.    Appearance: Normal appearance. She is not toxic-appearing.  HENT:     Head: Normocephalic and atraumatic.  Eyes:     General: No scleral icterus. Cardiovascular:     Rate and Rhythm: Normal rate and regular rhythm.     Pulses: Normal pulses.     Heart sounds: Normal heart sounds.  Pulmonary:     Effort: Pulmonary effort is normal.     Breath sounds: Normal breath sounds.  Chest:     Comments: Palpable tenderness in the right rib cage around the ninth rib but no crepitus Abdominal:     General: Abdomen is flat. Bowel sounds are normal. There is no distension.     Palpations: Abdomen is soft.     Tenderness: There is no  abdominal tenderness.  Musculoskeletal:        General: No swelling.     Cervical back: Neck supple.  Lymphadenopathy:     Cervical: No cervical adenopathy.  Skin:    General: Skin is warm and dry.     Findings: No rash.  Neurological:     General: No focal deficit present.     Mental Status: She is alert.  Psychiatric:        Mood and Affect: Mood normal.        Behavior: Behavior normal.    LABORATORY DATA:  CBC    Component Value Date/Time   WBC 7.9 11/05/2021 1203   WBC 8.5 10/31/2021 1101   RBC 3.93 11/05/2021 1203   HGB 12.4 11/05/2021 1203   HCT 35.3 (L) 11/05/2021 1203   PLT 237 11/05/2021 1203   MCV 89.8 11/05/2021 1203   MCH 31.6 11/05/2021 1203   MCHC 35.1 11/05/2021 1203   RDW 12.0 11/05/2021 1203   LYMPHSABS 2.5 11/05/2021 1203   MONOABS 0.6 11/05/2021 1203   EOSABS 0.2 11/05/2021 1203   BASOSABS 0.0 11/05/2021 1203    CMP     Component Value Date/Time   NA 138 11/05/2021 1203   K 3.8 11/05/2021 1203   CL 104 11/05/2021 1203   CO2 28 11/05/2021 1203   GLUCOSE 104 (H) 11/05/2021 1203   BUN 9 11/05/2021 1203   CREATININE 0.49 11/05/2021 1203   CALCIUM 10.0 11/05/2021 1203   PROT 8.1 11/05/2021 1203   ALBUMIN 4.3 11/05/2021 1203   AST 68 (H) 11/05/2021 1203   ALT 156 (H) 11/05/2021 1203   ALKPHOS 101 11/05/2021 1203   BILITOT 0.3 11/05/2021 1203   GFRNONAA >60 11/05/2021 1203   GFRAA >60 03/16/2020 1011   GFRAA >60 07/21/2019 1025    ASSESSMENT and THERAPY PLAN:   Malignant neoplasm of overlapping sites of right breast in female, estrogen receptor negative [facility]) The patient is a 31 year old Spanish-speaking woman with 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  She is here today with a new pathologic fracture of her right seventh anterolateral rib.  This was not seen on the x-ray imaging that was completed at the [place]  long facility.  Bone scan was done  to further evaluate possible metastatic disease in this picked up uptake in the right 10th rib suspicious for bone metastasis.  At this time given concern for metastatic cancer, I would recommend proceeding with PET/CT for further evaluation.  She may need another biopsy to confirm prognostics as well however we prefer not to perform bone biopsies unless this is our only metastatic site.  I have ordered an urgent PET/CT today and she should return to clinic in 2 weeks with the PET results so we can proceed with biopsy accordingly.  We can also consider palliative radiation to the site for pain control once metastasis is confirmed.    She was given the phone number to call and schedule a PET/CT   All questions were answered. The patient knows to call the clinic with any problems, questions or concerns. We can certainly see the patient much sooner if necessary.  Total encounter time:30 minutes*in face-to-face visit time, chart review, lab review, care coordination, order entry, and documentation of the encounter time.  *Total Encounter Time as defined by the Centers for Medicare and Medicaid Services includes, in addition to the face-to-face time of a patient visit (documented in the note above) non-face-to-face time: obtaining and reviewing outside history, ordering and reviewing medications, tests or procedures, care coordination (communications with other health care professionals or caregivers) and documentation in the medical record.

## 2021-11-19 NOTE — Assessment & Plan Note (Addendum)
The patient is a 31 year old Spanish-speaking woman with 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  She is here today with a new pathologic fracture of her right seventh anterolateral rib.  This was not seen on the x-ray imaging that was completed at the [place] long facility.  Bone scan was done to further evaluate possible metastatic disease in this picked up uptake in the right 10th rib suspicious for bone metastasis.  At this time given concern for metastatic cancer, I would recommend proceeding with PET/CT for further evaluation.  She may need another biopsy to confirm prognostics as well however we prefer not to perform bone biopsies unless this is our only metastatic site.  I have ordered an urgent PET/CT today and she should return to clinic in 2 weeks with the PET results so we can proceed with biopsy accordingly.  We can also consider palliative radiation to the site for pain control once metastasis is confirmed.    She was given the phone number to call and schedule a PET/CT

## 2021-11-20 ENCOUNTER — Telehealth: Payer: Self-pay | Admitting: Hematology and Oncology

## 2021-11-20 NOTE — Telephone Encounter (Signed)
Scheduled appointment per 06/06 los. Left message.

## 2021-11-27 ENCOUNTER — Encounter (HOSPITAL_COMMUNITY)
Admission: RE | Admit: 2021-11-27 | Discharge: 2021-11-27 | Disposition: A | Discharge status: Home / Self Care | Payer: Self-pay | Source: Ambulatory Visit | Attending: Hematology and Oncology | Admitting: Hematology and Oncology

## 2021-11-27 DIAGNOSIS — C50811 Malignant neoplasm of overlapping sites of right female breast: Secondary | ICD-10-CM | POA: Insufficient documentation

## 2021-11-27 DIAGNOSIS — Z171 Estrogen receptor negative status [ER-]: Secondary | ICD-10-CM | POA: Insufficient documentation

## 2021-11-27 LAB — GLUCOSE, CAPILLARY: Glucose-Capillary: 96 mg/dL (ref 70–99)

## 2021-11-27 IMAGING — PT NM PET TUM IMG RESTAG (PS) SKULL BASE T - THIGH
7 series · 25 of 25 positions shown · non-contrast
Comparison: Whole-body bone scan from [DATE]. Chest CT
[DATE].

CLINICAL DATA: Initial treatment strategy for breast cancer,
invasive, stage IV.

EXAM:
NUCLEAR MEDICINE PET SKULL BASE TO THIGH
TECHNIQUE: 8.7 mCi F-18 FDG was injected intravenously. Full-ring PET imaging
was performed from the skull base to thigh after the radiotracer. CT
data was obtained and used for attenuation correction and anatomic
localization.
Fasting blood glucose: 96 mg/dl

[Series 3: pet sk_thigh ac · axial · 5.0mm · 4.07mm/px · z∈[-894,-66]mm · 6 of 208 slices shown]
[im 1/208]
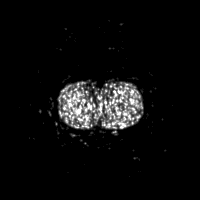
[im 42/208]
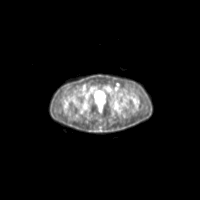
[im 83/208]
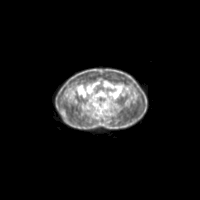
[im 125/208]
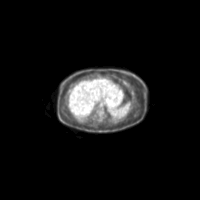
[im 166/208]
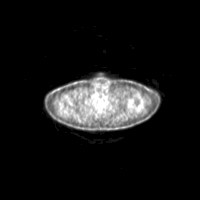
[im 208/208]
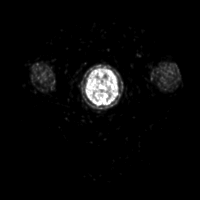

[Series 4: ct sk_thigh 5.0 br38 · axial · 5.0mm · 0.98mm/px · z∈[-894,-66]mm · 5 of 208 slices shown]
[im 1/208]
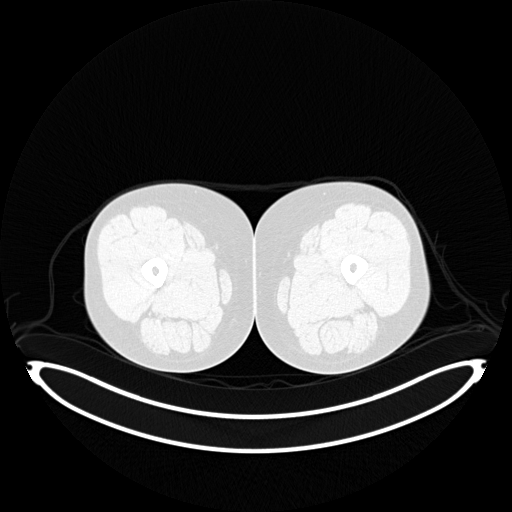
[im 52/208]
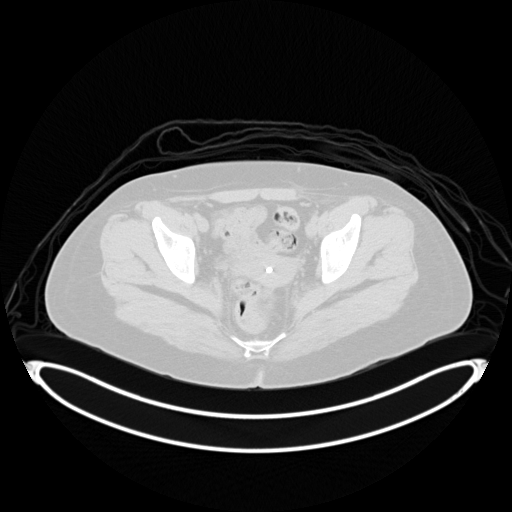
[im 104/208]
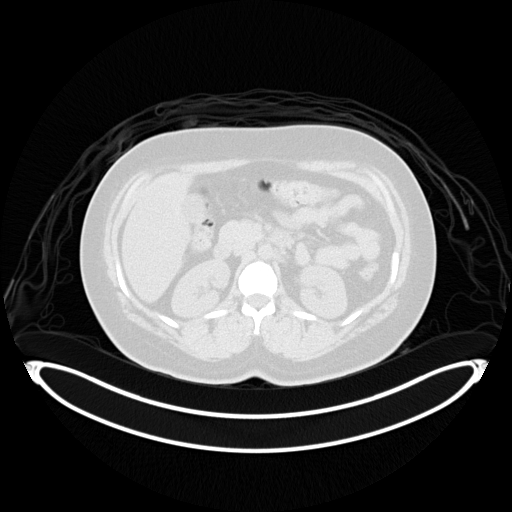
[im 156/208]
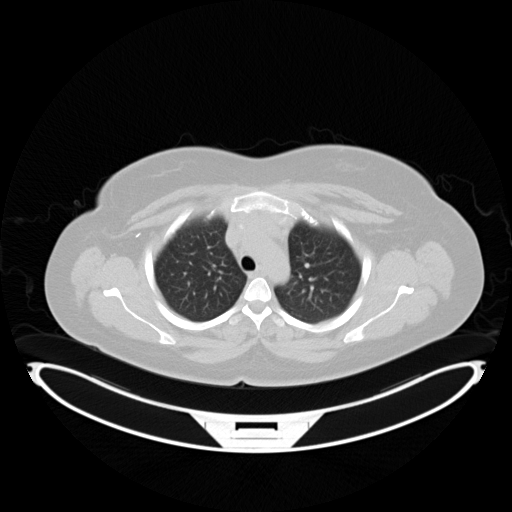
[im 208/208  brain]
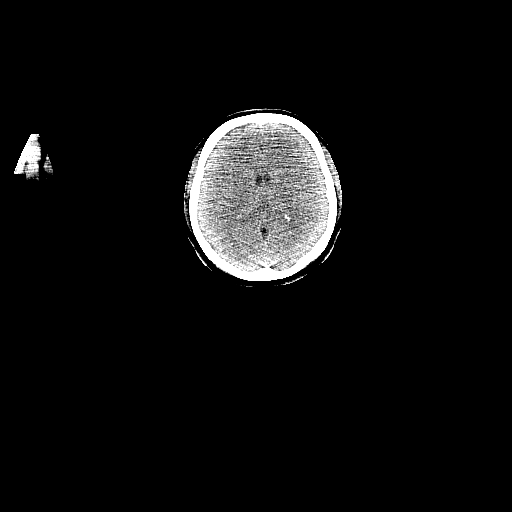

[Series 5: pet sk_thigh nac · axial · 5.0mm · 4.07mm/px · z∈[-894,-66]mm · 5 of 208 slices shown]
[im 1/208]
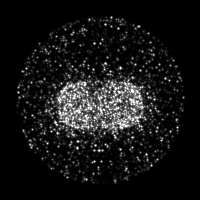
[im 52/208]
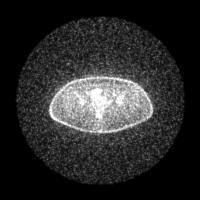
[im 104/208]
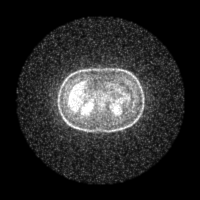
[im 156/208]
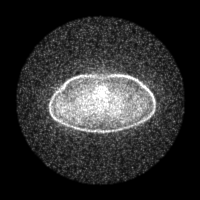
[im 208/208]
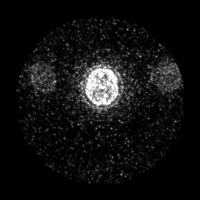

[Series 7: ct 5.0 bl57 lung_bone · axial · 5.0mm · 0.64mm/px · 1 of 45 slices shown]
[im 1/45]
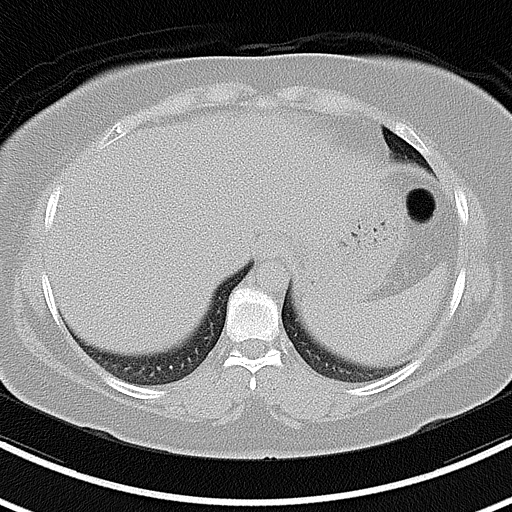

[Series 603: fused tra · 5 of 207 slices shown]
[im 1/207]
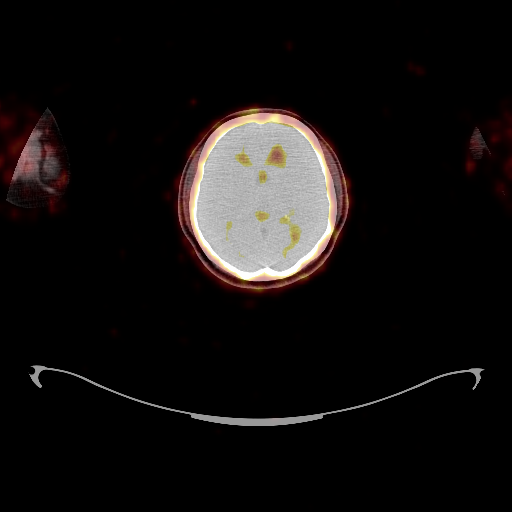
[im 52/207]
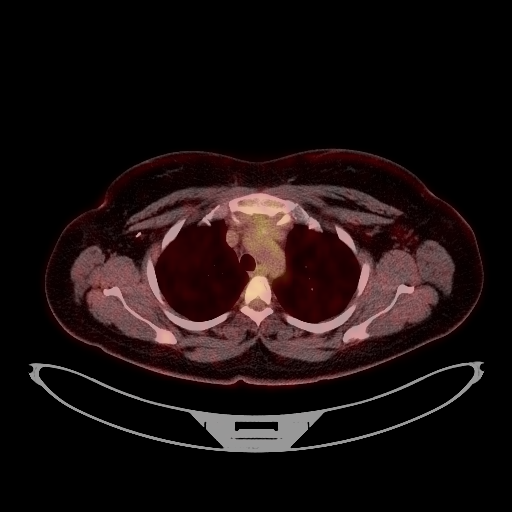
[im 104/207]
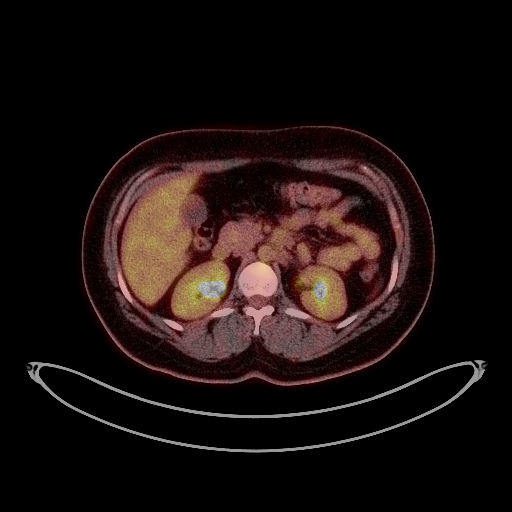
[im 155/207]
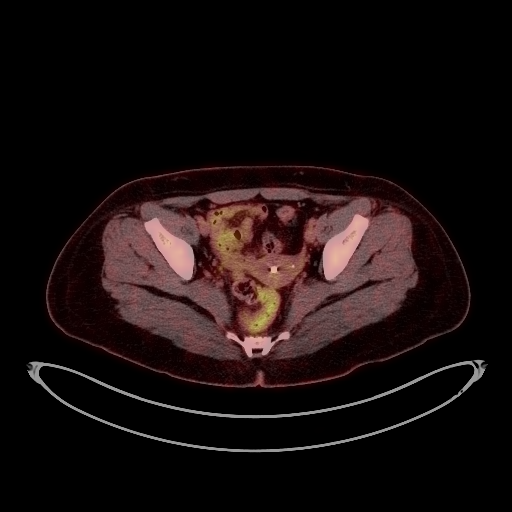
[im 207/207]
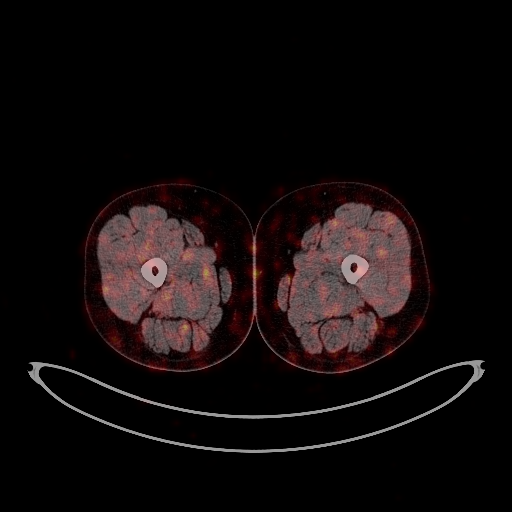

[Series 604: fused cor · 2 of 66 slices shown]
[im 1/66]
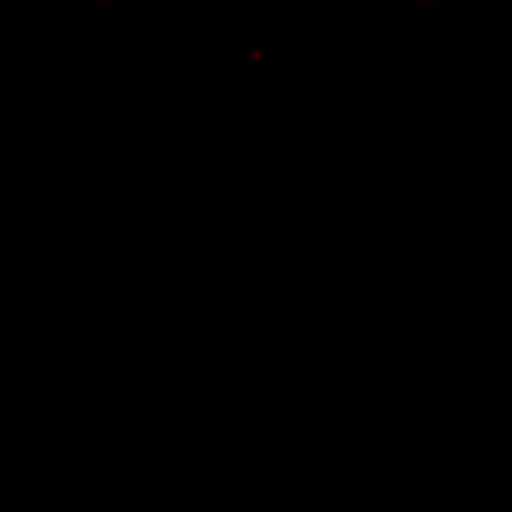
[im 66/66]
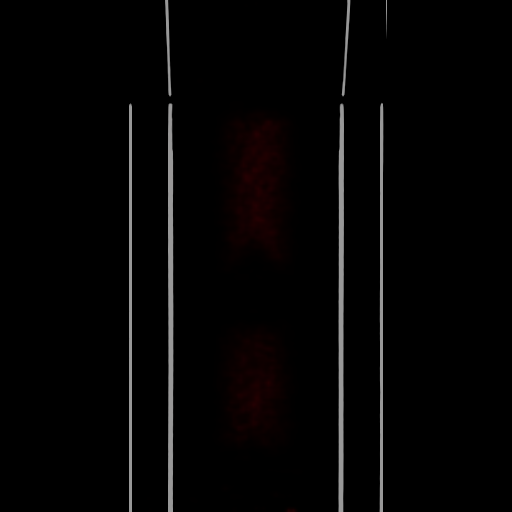

[Series 605: mip pet · coronal · 1.72mm/px · 1 of 32 slices shown]
[im 1/32]
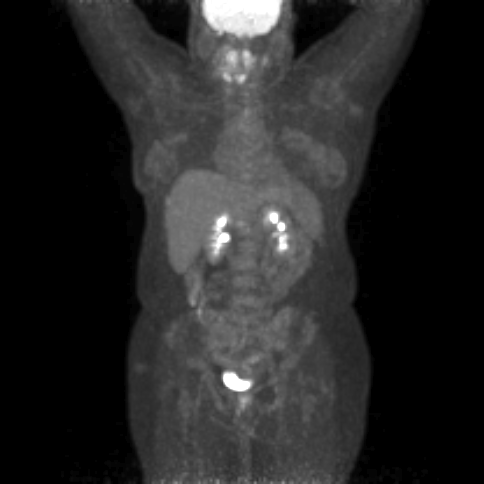

[25 of 25 positions shown; findings below may reference images not displayed]

FINDINGS: Mediastinal blood pool activity: SUV max

NECK:

No hypermetabolic cervical lymph nodes are identified.Prominent
symmetric activity within the lymphoid tissue of Waldeyer's ring,
likely physiologic/inflammatory. Symmetric tongue activity appears
physiologic. No suspicious activity within the pharyngeal mucosal
space.

Incidental CT findings: none

CHEST:

There are no hypermetabolic mediastinal, hilar or axillary lymph
nodes. No hypermetabolic pulmonary activity or suspicious
nodularity. No hypermetabolic activity within the right breast.

Incidental CT findings: Postsurgical changes in the right breast and
right axilla without recurrent mass lesion. Mild subpleural scarring
anteriorly in the right lung attributed to radiation therapy.

ABDOMEN/PELVIS:

There is no hypermetabolic activity within the liver, adrenal
glands, spleen or pancreas. No suspicious hypermetabolic nodal
activity. Small left inguinal lymph node with mild metabolic
activity (SUV max 2.8), likely reactive.

Incidental CT findings: Intrauterine device noted.

SKELETON:

There is low-level hypermetabolic activity anterolaterally within
the right 7th rib (SUV max 3.6). This corresponds with mild
sclerosis on the CT images (image 86/4). This is likely the same
area of mildly increased uptake on the recent bone scan. No other
abnormal osseous activity.

Incidental CT findings: none
IMPRESSION: 1. Sclerotic lesion of the right 7th rib with low level metabolic
activity, favoring a healed fracture or sequela of prior radiation
therapy. An isolated rib lesion is unlikely to reflect metastatic
disease. Recommend attention on follow-up.
2. No other evidence of chest wall recurrence or metastatic disease
status post right lumpectomy and axillary node dissection.

## 2021-11-27 MED ORDER — FLUDEOXYGLUCOSE F - 18 (FDG) INJECTION
8.7000 | Freq: Once | INTRAVENOUS | Status: AC
  Administered 2021-11-27: 8.7 via INTRAVENOUS

## 2021-12-05 ENCOUNTER — Telehealth: Payer: Self-pay | Admitting: *Deleted

## 2021-12-05 NOTE — Telephone Encounter (Addendum)
-----   Message from Benay Pike, MD sent at 12/02/2021  9:30 PM EDT ----- Val  Can you call the patient and discuss results with her. She is spanish speaking. I want to see her in 3 months.  Thanks,  This RN left message per scan results via [employer] in [place] per MD.  This RN's name and return call number given.

## 2021-12-13 ENCOUNTER — Other Ambulatory Visit: Payer: Self-pay

## 2021-12-13 ENCOUNTER — Inpatient Hospital Stay: Payer: No Typology Code available for payment source

## 2021-12-13 ENCOUNTER — Inpatient Hospital Stay (HOSPITAL_BASED_OUTPATIENT_CLINIC_OR_DEPARTMENT_OTHER): Payer: No Typology Code available for payment source | Admitting: Hematology and Oncology

## 2021-12-13 ENCOUNTER — Encounter: Payer: Self-pay | Admitting: Hematology and Oncology

## 2021-12-13 DIAGNOSIS — Z803 Family history of malignant neoplasm of breast: Secondary | ICD-10-CM

## 2021-12-13 DIAGNOSIS — Z95828 Presence of other vascular implants and grafts: Secondary | ICD-10-CM

## 2021-12-13 DIAGNOSIS — C50811 Malignant neoplasm of overlapping sites of right female breast: Secondary | ICD-10-CM

## 2021-12-13 DIAGNOSIS — Z01419 Encounter for gynecological examination (general) (routine) without abnormal findings: Secondary | ICD-10-CM

## 2021-12-13 DIAGNOSIS — Z171 Estrogen receptor negative status [ER-]: Secondary | ICD-10-CM

## 2021-12-13 MED ORDER — LEUPROLIDE ACETATE 3.75 MG IM KIT
3.7500 mg | PACK | Freq: Once | INTRAMUSCULAR | Status: AC
  Administered 2021-12-13: 3.75 mg via INTRAMUSCULAR
  Filled 2021-12-13: qty 3.75

## 2021-12-13 NOTE — Patient Instructions (Signed)
Leuprolide injection What is this medication? LEUPROLIDE (loo PROE lide) is a man-made hormone. It is used to treat the symptoms of prostate cancer. This medicine may also be used to treat children with early onset of puberty. It may be used for other hormonal conditions. This medicine may be used for other purposes; ask your health care provider or pharmacist if you have questions. COMMON BRAND NAME(S): Lupron What should I tell my care team before I take this medication? They need to know if you have any of these conditions: diabetes heart disease or previous heart attack high blood pressure high cholesterol pain or difficulty passing urine spinal cord metastasis stroke tobacco smoker an unusual or allergic reaction to leuprolide, benzyl alcohol, other medicines, foods, dyes, or preservatives pregnant or trying to get pregnant breast-feeding How should I use this medication? This medicine is for injection under the skin or into a muscle. You will be taught how to prepare and give this medicine. Use exactly as directed. Take your medicine at regular intervals. Do not take your medicine more often than directed. It is important that you put your used needles and syringes in a special sharps container. Do not put them in a trash can. If you do not have a sharps container, call your pharmacist or healthcare provider to get one. A special MedGuide will be given to you by the pharmacist with each prescription and refill. Be sure to read this information carefully each time. Talk to your pediatrician regarding the use of this medicine in children. While this medicine may be prescribed for children as young as 8 years for selected conditions, precautions do apply. Overdosage: If you think you have taken too much of this medicine contact a poison control center or emergency room at once. NOTE: This medicine is only for you. Do not share this medicine with others. What if I miss a dose? If you miss  a dose, take it as soon as you can. If it is almost time for your next dose, take only that dose. Do not take double or extra doses. What may interact with this medication? Do not take this medicine with any of the following medications: chasteberry cisapride dronedarone pimozide thioridazine This medicine may also interact with the following medications: herbal or dietary supplements, like black cohosh or DHEA female hormones, like estrogens or progestins and birth control pills, patches, rings, or injections female hormones, like testosterone other medicines that prolong the QT interval (abnormal heart rhythm) This list may not describe all possible interactions. Give your health care provider a list of all the medicines, herbs, non-prescription drugs, or dietary supplements you use. Also tell them if you smoke, drink alcohol, or use illegal drugs. Some items may interact with your medicine. What should I watch for while using this medication? Visit your doctor or health care professional for regular checks on your progress. During the first week, your symptoms may get worse, but then will improve as you continue your treatment. You may get hot flashes, increased bone pain, increased difficulty passing urine, or an aggravation of nerve symptoms. Discuss these effects with your doctor or health care professional, some of them may improve with continued use of this medicine. Female patients may experience a menstrual cycle or spotting during the first 2 months of therapy with this medicine. If this continues, contact your doctor or health care professional. This medicine may increase blood sugar. Ask your healthcare provider if changes in diet or medicines are needed if you have  diabetes. What side effects may I notice from receiving this medication? Side effects that you should report to your doctor or health care professional as soon as possible: allergic reactions like skin rash, itching or  hives, swelling of the face, lips, or tongue breathing problems chest pain depression or memory disorders pain in your legs or groin pain at site where injected severe headache signs and symptoms of high blood sugar such as being more thirsty or hungry or having to urinate more than normal. You may also feel very tired or have blurry vision swelling of the feet and legs visual changes vomiting Side effects that usually do not require medical attention (report to your doctor or health care professional if they continue or are bothersome): breast swelling or tenderness decrease in sex drive or performance diarrhea hot flashes loss of appetite muscle, joint, or bone pains nausea redness or irritation at site where injected skin problems or acne This list may not describe all possible side effects. Call your doctor for medical advice about side effects. You may report side effects to FDA at 1-800-FDA-1088. Where should I keep my medication? Keep out of the reach of children. Store below 25 degrees C (77 degrees F). Do not freeze. Protect from light. Do not use if it is not clear or if there are particles present. Throw away any unused medicine after the expiration date. NOTE: This sheet is a summary. It may not cover all possible information. If you have questions about this medicine, talk to your doctor, pharmacist, or health care provider.  2023 Elsevier/Gold Standard (2021-05-03 00:00:00)

## 2021-12-13 NOTE — Progress Notes (Signed)
[facility] Follow up:    Pcp, No No address on file   DIAGNOSIS:  Cancer Staging  No matching staging information was found for the patient.  SUMMARY OF ONCOLOGIC HISTORY: 31 y.o. BRCA1 positive Brianna Owens woman status post right breast overlapping sites biopsy 05/04/2019 for a clinical T3 N0, stage IIIB invasive ductal carcinoma, grade 2, triple negative, with an MIB-1 of 40%.             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  The Common Hereditary Gene Panel offered by [facility] includes sequencing and/or deletion duplication testing of the following 48 genes: APC, ATM, AXIN2, BARD1, BMPR1A, BRCA1, BRCA2, BRIP1, CDH1, CDK4, CDKN2A (p14ARF), CDKN2A (p16INK4a), CHEK2, CTNNA1, DICER1, EPCAM (Deletion/duplication testing only), GREM1 (promoter region deletion/duplication testing only), KIT, MEN1, MLH1, MSH2, MSH3, MSH6, MUTYH, NBN, NF1, NHTL1, PALB2, PDGFRA, PMS2, POLD1, POLE, PTEN, RAD50, RAD51C, RAD51D, RNF43, SDHB, SDHC, SDHD, SMAD4, SMARCA4. STK11, TP53, TSC1, TSC2, and VHL.  The following genes were evaluated for sequence changes only: SDHA and HOXB13 c.251G>A variant only. The report date is 05/24/2019.             (b) recommended bilateral mastectomies, due to potential barriers/risks with age and intensified screening   (2) neoadjuvant chemotherapy consisting of doxorubicin and cyclophosphamide in dose dense fashion x4 started 05/26/2019, completed 07/07/2019, followed by paclitaxel and carboplatin weekly x12 started 07/21/2019             (a) breast MRI 09/26/2019 shows a complete radiologic response   (3) status post right lumpectomy 11/17/2019 showing a complete pathologic response [ypT0 ypN0]             (a) a total of 5 axillary lymph nodes were removed   (4)  adjuvant radiation   Radiation Treatment Dates: 01/02/2020 through 02/15/2020 Site Technique Total Dose (Gy) Dose per Fx (Gy) Completed Fx Beam Energies  Breast, Right: Breast_Rt 3D 50.4/50.4 1.8 28/28 6X, 10X  Breast, Right: Breast_Rt_Bst 3D 10/10 2 5/5 6X  Sclav-RT: SCV_Rt 3D 50.4/50.4 1.8 28/28 6X, 10X      (5) considering bilateral salpingo-oophorectomy for ovarian cancer prevention             (a) receiving leuprolide/Lupron every 28 days   CURRENT THERAPY: observation  INTERVAL HISTORY:  Brianna Owens 31 y.o. female returns for f/u of RUQ pain.    She was evaluated on 10/31/2021 at the [facility] ER, xray noted no fracture and small amount of gall bladder sludge.  She is scheduled to see GI next week on 11/14/2021.  She was seen at [facility] on 11/01/2021 and underwent CT abd/pelvis that noted a right anterolateral 7th rib pathologic fracture.   She then had a bone scan which showed lateral right tenth rib new compared to prior bone scan suspicious for bone mets. PET showed focal radiotracer uptake identified in the lateral right seventh rib favoring a healed fracture. Unlikely metastatic disease. Her pain is much better, no other complaints today. No cough, chest pain, SOB, change in bowel habits. No trouble urinating. No headaches, double vision, balance changes.  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  MEDICAL HISTORY: Past Medical History:  Diagnosis Date   Breast cancer ([place])    Cancer ([place]) 05/05/2019   Personal history of chemotherapy    Personal history of  radiation therapy     SURGICAL HISTORY: Past Surgical History:  Procedure Laterality Date   BREAST BIOPSY     BREAST LUMPECTOMY     BREAST LUMPECTOMY WITH RADIOACTIVE SEED AND SENTINEL LYMPH NODE BIOPSY Right 11/17/2019   Procedure: RIGHT BREAST LUMPECTOMY WITH RADIOACTIVE SEED;  Surgeon: Erroll Luna, MD;  Location: [facility];  Service: General;  Laterality: Right;  PEC BLOCK   IR IMAGING GUIDED PORT INSERTION  05/24/2019   IR REMOVAL TUN ACCESS W/ PORT W/O FL MOD SED  07/18/2020   SENTINEL NODE BIOPSY Right 11/17/2019   Procedure: Sentinel Node Biopsy;  Surgeon: Erroll Luna, MD;  Location: [facility];  Service: General;  Laterality: Right;    SOCIAL HISTORY: Social History   Socioeconomic History   Marital status: Single    Spouse name: Not on file   Number of children: Not on file   Years of education: Not on file   Highest education level: 9th grade  Occupational History   Not on file  Tobacco Use   Smoking status: Never   Smokeless tobacco: Never  Vaping Use   Vaping Use: Never used  Substance and Sexual Activity   Alcohol use: Not Currently   Drug use: Not Currently   Sexual activity: Yes    Birth control/protection: I.U.D.  Other Topics Concern   Not on file  Social History Narrative   Not on file   Social Determinants of Health   Financial Resource Strain: Not on file  Food Insecurity: Food Insecurity Present (07/18/2021)   Hunger Vital Sign    Worried About [place] in the Last Year: Often true    Ran Out of Food in the Last Year: Often true  Transportation Needs: No Transportation Needs (07/18/2021)   PRAPARE - Hydrologist (Medical): No    Lack of Transportation (Non-Medical): No  Physical Activity: Not on file  Stress: Not on file  Social Connections: Not on file  Intimate Partner Violence: Not on file    FAMILY HISTORY: Family History  Problem Relation Age of Onset   Breast cancer Mother    Breast cancer Maternal  Aunt     Review of Systems  Constitutional:  Negative for appetite change, chills, fatigue, fever and unexpected weight change.  HENT:   Negative for hearing loss, lump/mass and trouble swallowing.   Eyes:  Negative for eye problems and icterus.  Respiratory:  Negative for chest tightness, cough and shortness of breath.   Cardiovascular:  Negative for chest pain, leg swelling and palpitations.  Gastrointestinal:  Negative for abdominal distention, abdominal pain, constipation, diarrhea, nausea and vomiting.  Endocrine: Negative for hot flashes.  Genitourinary:  Negative for difficulty urinating.   Musculoskeletal:  Negative for arthralgias.  Skin:  Negative for itching and rash.  Neurological:  Negative for dizziness, extremity weakness, headaches and numbness.  Hematological:  Negative for adenopathy. Does not bruise/bleed easily.  Psychiatric/Behavioral:  Negative for depression. The patient is not nervous/anxious.  PHYSICAL EXAMINATION  ECOG PERFORMANCE STATUS: 1 - Symptomatic but completely ambulatory  Vitals:   12/13/21 1404  BP: 130/85  Pulse: 89  Resp: 16  Temp: 97.9 F (36.6 C)  SpO2: 100%    Physical exam deferred today in lieu of counseling  LABORATORY DATA:  CBC    Component Value Date/Time   WBC 7.9 11/05/2021 1203   WBC 8.5 10/31/2021 1101   RBC 3.93 11/05/2021 1203   HGB 12.4 11/05/2021 1203   HCT 35.3 (L) 11/05/2021 1203   PLT 237 11/05/2021 1203   MCV 89.8 11/05/2021 1203   MCH 31.6 11/05/2021 1203   MCHC 35.1 11/05/2021 1203   RDW 12.0 11/05/2021 1203   LYMPHSABS 2.5 11/05/2021 1203   MONOABS 0.6 11/05/2021 1203   EOSABS 0.2 11/05/2021 1203   BASOSABS 0.0 11/05/2021 1203    CMP     Component Value Date/Time   NA 138 11/05/2021 1203   K 3.8 11/05/2021 1203   CL 104 11/05/2021 1203   CO2 28 11/05/2021 1203   GLUCOSE 104 (H) 11/05/2021 1203   BUN 9 11/05/2021 1203   CREATININE 0.49 11/05/2021 1203   CALCIUM 10.0 11/05/2021 1203    PROT 8.1 11/05/2021 1203   ALBUMIN 4.3 11/05/2021 1203   AST 68 (H) 11/05/2021 1203   ALT 156 (H) 11/05/2021 1203   ALKPHOS 101 11/05/2021 1203   BILITOT 0.3 11/05/2021 1203   GFRNONAA >60 11/05/2021 1203   GFRAA >60 03/16/2020 1011   GFRAA >60 07/21/2019 1025    ASSESSMENT and THERAPY PLAN:   Malignant neoplasm of overlapping sites of right breast in female, estrogen receptor negative [facility]) The patient is a 31 year old Spanish-speaking woman with 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  She complained of right sided pain and hence we did some imaging. No findings on X ray. [facility] was then seen in [facility] which showed a rib lesion concerning for possible metastatic disease. We then did bone scan which once again indicated some uptake in the right 10th rib.  We proceeded with PET scan to evaluate for systemic disease and this did not show any evidence of metastatic disease.  The isolated right seventh rib was thought to be healed fracture, could be secondary to radiation and the fall.  I have discussed with Dr. Leafy Ro from radiology who suggested that on bone scan it is sometimes difficult to count the ribs.   He has previously mentioned and thinks this is healed rib fracture and a very unlikely metastatic.  Since the patient's pain has also significantly improved and since she has no new clinical symptoms, we agreed to clinically monitor.  Once again given the BRCA1 mutation, I have added mammogram 6 months from MRI.  She is not ready to proceed with surgery.  We have also discussed risks of ovarian cancer and have on several discussions recommended bilateral salpingo-oophorectomy when she is done with childbearing.  She understands all her risks and she will return to clinic in 3 months or sooner as needed.      All questions were answered. The patient knows to call the  clinic with any problems, questions or concerns. We can certainly see the patient much sooner if necessary.  Total encounter time:30 minutes*in face-to-face visit time, chart review, lab review, care coordination, order entry, and documentation of the encounter time.  *Total Encounter Time as defined by the Centers for Medicare and Medicaid  Services includes, in addition to the face-to-face time of a patient visit (documented in the note above) non-face-to-face time: obtaining and reviewing outside history, ordering and reviewing medications, tests or procedures, care coordination (communications with other health care professionals or caregivers) and documentation in the medical record.

## 2021-12-13 NOTE — Assessment & Plan Note (Addendum)
The patient is a 31 year old Spanish-speaking woman with 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  She complained of right sided pain and hence we did some imaging. No findings on X ray. She was then seen [facility] which showed a rib lesion concerning for possible metastatic disease. We then did bone scan which once again indicated some uptake in the right 10th rib.  We proceeded with PET scan to evaluate for systemic disease and this did not show any evidence of metastatic disease.  The isolated right seventh rib was thought to be healed fracture, could be secondary to radiation and the fall.  I have discussed with Dr. Leafy Ro from radiology who suggested that on bone scan it is sometimes difficult to count the ribs.   He has previously mentioned and thinks this is healed rib fracture and a very unlikely metastatic.  Since the patient's pain has also significantly improved and since she has no new clinical symptoms, we agreed to clinically monitor.  Once again given the BRCA1 mutation, I have added mammogram 6 months from MRI.  She is not ready to proceed with surgery.  We have also discussed risks of ovarian cancer and have on several discussions recommended bilateral salpingo-oophorectomy when she is done with childbearing.  She understands all her risks and she will return to clinic in 3 months or sooner as needed.

## 2021-12-16 ENCOUNTER — Inpatient Hospital Stay: Payer: No Typology Code available for payment source

## 2022-01-10 ENCOUNTER — Inpatient Hospital Stay: Payer: No Typology Code available for payment source

## 2022-01-13 ENCOUNTER — Inpatient Hospital Stay: Payer: No Typology Code available for payment source | Attending: Oncology

## 2022-01-13 ENCOUNTER — Other Ambulatory Visit: Payer: Self-pay

## 2022-01-13 VITALS — BP 136/91 | HR 81 | Temp 98.1°F | Resp 18

## 2022-01-13 DIAGNOSIS — C50811 Malignant neoplasm of overlapping sites of right female breast: Secondary | ICD-10-CM | POA: Insufficient documentation

## 2022-01-13 DIAGNOSIS — Z01419 Encounter for gynecological examination (general) (routine) without abnormal findings: Secondary | ICD-10-CM

## 2022-01-13 DIAGNOSIS — Z171 Estrogen receptor negative status [ER-]: Secondary | ICD-10-CM | POA: Insufficient documentation

## 2022-01-13 DIAGNOSIS — Z95828 Presence of other vascular implants and grafts: Secondary | ICD-10-CM

## 2022-01-13 MED ORDER — LEUPROLIDE ACETATE 3.75 MG IM KIT
3.7500 mg | PACK | Freq: Once | INTRAMUSCULAR | Status: AC
  Administered 2022-01-13: 3.75 mg via INTRAMUSCULAR
  Filled 2022-01-13: qty 3.75

## 2022-01-14 ENCOUNTER — Inpatient Hospital Stay: Payer: No Typology Code available for payment source

## 2022-01-14 ENCOUNTER — Inpatient Hospital Stay: Payer: No Typology Code available for payment source | Admitting: Hematology and Oncology

## 2022-01-16 ENCOUNTER — Encounter: Payer: Self-pay | Admitting: Hematology and Oncology

## 2022-02-07 ENCOUNTER — Inpatient Hospital Stay: Payer: No Typology Code available for payment source | Attending: Oncology

## 2022-02-07 ENCOUNTER — Other Ambulatory Visit: Payer: Self-pay

## 2022-02-07 VITALS — BP 128/93 | HR 92 | Temp 97.8°F | Resp 18

## 2022-02-07 DIAGNOSIS — C50811 Malignant neoplasm of overlapping sites of right female breast: Secondary | ICD-10-CM | POA: Insufficient documentation

## 2022-02-07 DIAGNOSIS — Z171 Estrogen receptor negative status [ER-]: Secondary | ICD-10-CM | POA: Insufficient documentation

## 2022-02-07 DIAGNOSIS — Z95828 Presence of other vascular implants and grafts: Secondary | ICD-10-CM

## 2022-02-07 DIAGNOSIS — Z01419 Encounter for gynecological examination (general) (routine) without abnormal findings: Secondary | ICD-10-CM

## 2022-02-07 MED ORDER — LEUPROLIDE ACETATE 3.75 MG IM KIT
3.7500 mg | PACK | Freq: Once | INTRAMUSCULAR | Status: AC
  Administered 2022-02-07: 3.75 mg via INTRAMUSCULAR
  Filled 2022-02-07: qty 3.75

## 2022-03-07 ENCOUNTER — Encounter: Payer: Self-pay | Admitting: Hematology and Oncology

## 2022-03-07 ENCOUNTER — Inpatient Hospital Stay: Payer: No Typology Code available for payment source | Attending: Oncology | Admitting: Hematology and Oncology

## 2022-03-07 ENCOUNTER — Inpatient Hospital Stay: Payer: No Typology Code available for payment source

## 2022-03-07 ENCOUNTER — Other Ambulatory Visit: Payer: Self-pay

## 2022-03-07 ENCOUNTER — Telehealth: Payer: Self-pay | Admitting: *Deleted

## 2022-03-07 VITALS — BP 128/87 | HR 99 | Temp 97.9°F | Resp 18 | Ht 61.0 in | Wt 170.5 lb

## 2022-03-07 DIAGNOSIS — Z95828 Presence of other vascular implants and grafts: Secondary | ICD-10-CM

## 2022-03-07 DIAGNOSIS — C50811 Malignant neoplasm of overlapping sites of right female breast: Secondary | ICD-10-CM

## 2022-03-07 DIAGNOSIS — Z171 Estrogen receptor negative status [ER-]: Secondary | ICD-10-CM | POA: Insufficient documentation

## 2022-03-07 DIAGNOSIS — Z1501 Genetic susceptibility to malignant neoplasm of breast: Secondary | ICD-10-CM

## 2022-03-07 DIAGNOSIS — Z1509 Genetic susceptibility to other malignant neoplasm: Secondary | ICD-10-CM

## 2022-03-07 DIAGNOSIS — Z01419 Encounter for gynecological examination (general) (routine) without abnormal findings: Secondary | ICD-10-CM

## 2022-03-07 MED ORDER — LEUPROLIDE ACETATE 3.75 MG IM KIT
3.7500 mg | PACK | Freq: Once | INTRAMUSCULAR | Status: AC
  Administered 2022-03-07: 3.75 mg via INTRAMUSCULAR
  Filled 2022-03-07: qty 3.75

## 2022-03-07 NOTE — Assessment & Plan Note (Addendum)
The patient is a 31 year old Spanish-speaking woman with 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   Once again given the BRCA1 mutation, I have added mammogram 6 months from MRI.  She is scheduled for mammogram in October.  We have also once again discussed about the risk of ovarian cancer and recommended referral to gynecological oncology for monitoring since she is not ready for bilateral salpingo-oophorectomy.  I have given her the phone number to call for gynecologic oncology.  We have on multiple occasions spoken about the impact of BRCA1 mutation. She will proceed with mammogram as scheduled today and return to clinic in 6 months or sooner as needed.

## 2022-03-07 NOTE — Telephone Encounter (Signed)
Spoke with [name] the [facility] interpreter regarding the referral to GYN oncology.[name] will give the patient the following information.  Patient scheduled as new patient with Dr Ernestina Patches on 10/2 at 9:45. Patient given an arrival time of 9:15 am.  Explained to the patient the the doctor will perform a pelvic exam at this visit. Patient given the policy that no visitors under the 16 yrs are allowed in the [facility]. Patient given the address/phone number for the clinic and that the center offers free valet service.

## 2022-03-07 NOTE — Progress Notes (Signed)
[facility] Follow up:    Pcp, No No address on file   DIAGNOSIS:  Cancer Staging  No matching staging information was found for the patient.  SUMMARY OF ONCOLOGIC HISTORY: 31 y.o. BRCA1 positive Brianna Owens woman status post right breast overlapping sites biopsy 05/04/2019 for a clinical T3 N0, stage IIIB invasive ductal carcinoma, grade 2, triple negative, with an MIB-1 of 40%.             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  The Common Hereditary Gene Panel offered by [facility] includes sequencing and/or deletion duplication testing of the following 48 genes: APC, ATM, AXIN2, BARD1, BMPR1A, BRCA1, BRCA2, BRIP1, CDH1, CDK4, CDKN2A (p14ARF), CDKN2A (p16INK4a), CHEK2, CTNNA1, DICER1, EPCAM (Deletion/duplication testing only), GREM1 (promoter region deletion/duplication testing only), KIT, MEN1, MLH1, MSH2, MSH3, MSH6, MUTYH, NBN, NF1, NHTL1, PALB2, PDGFRA, PMS2, POLD1, POLE, PTEN, RAD50, RAD51C, RAD51D, RNF43, SDHB, SDHC, SDHD, SMAD4, SMARCA4. STK11, TP53, TSC1, TSC2, and VHL.  The following genes were evaluated for sequence changes only: SDHA and HOXB13 c.251G>A variant only. The report date is 05/24/2019.             (b) recommended bilateral mastectomies, due to potential barriers/risks with age and intensified screening   (2) neoadjuvant chemotherapy consisting of doxorubicin and cyclophosphamide in dose dense fashion x4 started 05/26/2019, completed 07/07/2019, followed by paclitaxel and carboplatin weekly x12 started 07/21/2019             (a) breast MRI 09/26/2019 shows a complete radiologic response   (3) status post right lumpectomy 11/17/2019 showing a complete pathologic response [ypT0 ypN0]             (a) a total of 5 axillary lymph nodes were removed   (4)  adjuvant radiation   Radiation Treatment Dates: 01/02/2020 through 02/15/2020 Site Technique Total Dose (Gy) Dose per Fx (Gy) Completed Fx Beam Energies  Breast, Right: Breast_Rt 3D 50.4/50.4 1.8 28/28 6X, 10X  Breast, Right: Breast_Rt_Bst 3D 10/10 2 5/5 6X  Sclav-RT: SCV_Rt 3D 50.4/50.4 1.8 28/28 6X, 10X      (5) considering bilateral salpingo-oophorectomy for ovarian cancer prevention             (a) receiving leuprolide/Lupron every 28 days   CURRENT THERAPY: observation  INTERVAL HISTORY:  Brianna Owens 31 y.o. female returns for f/u  She arrived today with a certified Spanish interpreter.  Since her last visit, she denies any new health complaints.  She has been feeling quite well for the most part.  She is still has not established with a gynecologist.  She tells me that she forgot.  She denies any changes in her breast.  She has her mammogram scheduled in October.  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) 05/08/2019   Well woman exam with routine gynecological exam 04/26/2019    has No Known Allergies.  MEDICAL HISTORY: Past Medical History:  Diagnosis  Date   Breast cancer ([place])    Cancer ([place]) 05/05/2019   Personal history of chemotherapy    Personal history of radiation therapy     SURGICAL HISTORY: Past Surgical History:  Procedure Laterality Date   BREAST BIOPSY     BREAST LUMPECTOMY     BREAST LUMPECTOMY WITH RADIOACTIVE SEED AND SENTINEL LYMPH NODE BIOPSY Right 11/17/2019   Procedure: RIGHT BREAST LUMPECTOMY WITH RADIOACTIVE SEED;  Surgeon: Erroll Luna, MD;  Location: [facility];  Service:  General;  Laterality: Right;  PEC BLOCK   IR IMAGING GUIDED PORT INSERTION  05/24/2019   IR REMOVAL TUN ACCESS W/ PORT W/O FL MOD SED  07/18/2020   SENTINEL NODE BIOPSY Right 11/17/2019   Procedure: Sentinel Node Biopsy;  Surgeon: Erroll Luna, MD;  Location: [facility];  Service: General;  Laterality: Right;    SOCIAL HISTORY: Social History   Socioeconomic History   Marital status: Single    Spouse name: Not on file   Number of children: Not on file   Years of education: Not on file   Highest education level: 9th grade  Occupational History   Not on file  Tobacco Use   Smoking status: Never   Smokeless tobacco: Never  Vaping Use   Vaping Use: Never used  Substance and Sexual Activity   Alcohol use: Not Currently   Drug use: Not Currently   Sexual activity: Yes    Birth control/protection: I.U.D.  Other Topics Concern   Not on file  Social History Narrative   Not on file   Social Determinants of Health   Financial Resource Strain: Not on file  Food Insecurity: Food Insecurity Present (07/18/2021)   Hunger Vital Sign    Worried About [place] in the Last Year: Often true    Ran Out of Food in the Last Year: Often true  Transportation Needs: No Transportation Needs (07/18/2021)   PRAPARE - Hydrologist (Medical): No    Lack of Transportation (Non-Medical): No  Physical Activity: Not on file  Stress: Not on file  Social Connections: Not on file  Intimate Partner Violence: Not on file    FAMILY HISTORY: Family History  Problem Relation Age of Onset   Breast cancer Mother    Breast cancer Maternal Aunt     Review of Systems  Constitutional:  Negative for appetite change, chills, fatigue, fever and unexpected weight change.  HENT:   Negative for hearing loss, lump/mass and trouble swallowing.   Eyes:  Negative for eye problems and icterus.  Respiratory:  Negative for chest tightness, cough and shortness of breath.   Cardiovascular:   Negative for chest pain, leg swelling and palpitations.  Gastrointestinal:  Negative for abdominal distention, abdominal pain, constipation, diarrhea, nausea and vomiting.  Endocrine: Negative for hot flashes.  Genitourinary:  Negative for difficulty urinating.   Musculoskeletal:  Negative for arthralgias.  Skin:  Negative for itching and rash.  Neurological:  Negative for dizziness, extremity weakness, headaches and numbness.  Hematological:  Negative for adenopathy. Does not bruise/bleed easily.  Psychiatric/Behavioral:  Negative for depression. The patient is not nervous/anxious.       PHYSICAL EXAMINATION  ECOG PERFORMANCE STATUS: 1 - Symptomatic but completely ambulatory  Vitals:   03/07/22 1346  BP: 128/87  Pulse: 99  Resp: 18  Temp: 97.9 F (36.6 C)  SpO2: 100%   Physical Exam Constitutional:      Appearance: Normal appearance.  Chest:  Comments: Both breasts inspected and palpated.  No palpable masses or regional adenopathy Musculoskeletal:     Cervical back: Normal range of motion and neck supple. No rigidity.  Lymphadenopathy:     Cervical: No cervical adenopathy.  Neurological:     Mental Status: She is alert.      LABORATORY DATA:  CBC    Component Value Date/Time   WBC 7.9 11/05/2021 1203   WBC 8.5 10/31/2021 1101   RBC 3.93 11/05/2021 1203   HGB 12.4 11/05/2021 1203   HCT 35.3 (L) 11/05/2021 1203   PLT 237 11/05/2021 1203   MCV 89.8 11/05/2021 1203   MCH 31.6 11/05/2021 1203   MCHC 35.1 11/05/2021 1203   RDW 12.0 11/05/2021 1203   LYMPHSABS 2.5 11/05/2021 1203   MONOABS 0.6 11/05/2021 1203   EOSABS 0.2 11/05/2021 1203   BASOSABS 0.0 11/05/2021 1203    CMP     Component Value Date/Time   NA 138 11/05/2021 1203   K 3.8 11/05/2021 1203   CL 104 11/05/2021 1203   CO2 28 11/05/2021 1203   GLUCOSE 104 (H) 11/05/2021 1203   BUN 9 11/05/2021 1203   CREATININE 0.49 11/05/2021 1203   CALCIUM 10.0 11/05/2021 1203   PROT 8.1 11/05/2021 1203    ALBUMIN 4.3 11/05/2021 1203   AST 68 (H) 11/05/2021 1203   ALT 156 (H) 11/05/2021 1203   ALKPHOS 101 11/05/2021 1203   BILITOT 0.3 11/05/2021 1203   GFRNONAA >60 11/05/2021 1203   GFRAA >60 03/16/2020 1011   GFRAA >60 07/21/2019 1025    ASSESSMENT and THERAPY PLAN:   Malignant neoplasm of overlapping sites of right breast in female, estrogen receptor negative [facility]) The patient is a 31 year old Spanish-speaking woman with 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   Once again given the BRCA1 mutation, I have added mammogram 6 months from MRI.  She is scheduled for mammogram in October.  We have also once again discussed about the risk of ovarian cancer and recommended referral to gynecological oncology for monitoring since she is not ready for bilateral salpingo-oophorectomy.  I have given her the phone number to call for gynecologic oncology.  We have on multiple occasions spoken about the impact of BRCA1 mutation. She will proceed with mammogram as scheduled today and return to clinic in 6 months or sooner as needed.   All questions were answered. The patient knows to call the clinic with any problems, questions or concerns. We can certainly see the patient much sooner if necessary.  Total encounter time:30 minutes*in face-to-face visit time, chart review, lab review, care coordination, order entry, and documentation of the encounter time.  *Total Encounter Time as defined by the Centers for Medicare and Medicaid Services includes, in addition to the face-to-face time of a patient visit (documented in the note above) non-face-to-face time: obtaining and reviewing outside history, ordering and reviewing medications, tests or procedures, care coordination (communications with other health care professionals or caregivers) and documentation in the medical record.

## 2022-03-17 ENCOUNTER — Inpatient Hospital Stay: Payer: No Typology Code available for payment source | Attending: Oncology | Admitting: Psychiatry

## 2022-03-17 ENCOUNTER — Inpatient Hospital Stay: Payer: No Typology Code available for payment source

## 2022-03-17 ENCOUNTER — Encounter: Payer: Self-pay | Admitting: Psychiatry

## 2022-03-17 VITALS — BP 142/91 | HR 86 | Temp 98.0°F | Resp 18 | Ht 61.02 in | Wt 168.8 lb

## 2022-03-17 DIAGNOSIS — Z1509 Genetic susceptibility to other malignant neoplasm: Secondary | ICD-10-CM | POA: Insufficient documentation

## 2022-03-17 DIAGNOSIS — Z1501 Genetic susceptibility to malignant neoplasm of breast: Secondary | ICD-10-CM

## 2022-03-17 DIAGNOSIS — Z923 Personal history of irradiation: Secondary | ICD-10-CM | POA: Insufficient documentation

## 2022-03-17 DIAGNOSIS — Z79890 Hormone replacement therapy (postmenopausal): Secondary | ICD-10-CM | POA: Insufficient documentation

## 2022-03-17 DIAGNOSIS — Z171 Estrogen receptor negative status [ER-]: Secondary | ICD-10-CM | POA: Insufficient documentation

## 2022-03-17 DIAGNOSIS — C50811 Malignant neoplasm of overlapping sites of right female breast: Secondary | ICD-10-CM | POA: Insufficient documentation

## 2022-03-17 DIAGNOSIS — Z1502 Genetic susceptibility to malignant neoplasm of ovary: Secondary | ICD-10-CM | POA: Insufficient documentation

## 2022-03-17 DIAGNOSIS — Z148 Genetic carrier of other disease: Secondary | ICD-10-CM | POA: Insufficient documentation

## 2022-03-17 DIAGNOSIS — Z9221 Personal history of antineoplastic chemotherapy: Secondary | ICD-10-CM | POA: Insufficient documentation

## 2022-03-17 NOTE — Progress Notes (Signed)
GYNECOLOGIC ONCOLOGY NEW PATIENT CONSULTATION  Date of Service: 03/17/2022 Referring Provider: Benay Pike, MD [place],  [place] [address]   ASSESSMENT AND PLAN: Brianna Owens is a 31 y.o. woman with a personal history of breast cancer and BRCA1 mutation.  The risk of ovarian cancer in patients with BRCA 1 mutations is approximately 39-58% by the age of 73.  In addition, patients with BRCA mutations are also at increased risk of breast cancer and an increased (but still low) risk of pancreatic cancer and melanoma.  The [employer]  (NCCN) recommends removal of bilateral ovaries and fallopian tubes between the ages of 19-40, and/or when childbearing is completed, to reduce the risk of ovarian and fallopian tube cancer. Previously, NCCN had recommended that if a patient does not undergo risk-reducing surgery they have CA-125 serum levels and pelvic ultrasounds every 6 months as a screening approach. However, we reviewed that these are not particularly sensitive methods of screening and these recommendations have since been removed. Ultrasound and CA125 could still be considered, but limitations discussed.  Preventive surgery to remove the ovaries and fallopian tubes reduces the risk of a related cancer by 80% in women who carry a BRCA1 or BRCA2 mutation.  Women who undergo preventive surgery retain a 4% risk of developing cancer of the peritoneum.   Patient reports that she does not feel ready to proceed with wrist reducing surgery.  She may want to have 1 additional child.  Patient wishes to proceed with baseline ultrasound and Ca1 25.  We will revisit at next visit if we will continue this screening.  Would otherwise recommend that patient return in 6 months for history and exam.  Given that patient is not yet considering surgery, discussion regarding possible association of BRCA1 with endometrial cancer, and consideration of hysterectomy at time  versus reducing BSO, not yet addressed.  Return to clinic in 6 months.   A copy of this note was sent to the patient's referring provider.  Bernadene Bell, MD Gynecologic Oncology   Medical Decision Making I personally spent  TOTAL 43 minutes face-to-face and non-face-to-face in the care of this patient, which includes all pre, intra, and post visit time on the date of service.  3 minutes spent reviewing records prior to the visit 35 Minutes in patient contact 5 minutes charting , conferring with consultants etc.   ------------  CC: BRCA1 mutation  HISTORY OF PRESENT ILLNESS:  Brianna Owens is a 31 y.o. woman who is seen in consultation at the request of Iruku, Arletha Pili, MD for evaluation of BRCA1 mutation.  Patient was diagnosed with stage IIIb invasive ductal carcinoma, grade 2, triple negative in 05/04/2019.  She underwent genetic testing and was noted to have a BRCA1 mutation.  In terms of her breast cancer treatment she underwent neoadjuvant chemotherapy consisting of doxorubicin and cyclophosphamide x4, followed by paclitaxel and carboplatin weekly x12.  She then underwent lumpectomy and lymph node dissection in 2021 followed by adjuvant radiation.  Patient is currently receiving leuprolide/Lupron every 28 days. PET in June 2023 was negative for recurrent disease.  Today, patient presents alone.  She denies abdominal or pelvic pain, abdominal bloating, early satiety, significant weight loss, change in bowel or bladder habits.  She reports that she may wish to have 1 additional child.  She is not sure that she is ready for a risk reducing surgery at this time.  Visit completed with assistance of an in person Spanish interpreter.  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  The Common Hereditary Gene Panel offered by [facility] includes sequencing and/or deletion duplication testing of the following 48 genes: APC, ATM, AXIN2, BARD1, BMPR1A, BRCA1, BRCA2, BRIP1, CDH1, CDK4, CDKN2A (p14ARF), CDKN2A (p16INK4a), CHEK2, CTNNA1, DICER1, EPCAM (Deletion/duplication testing only), GREM1 (promoter region deletion/duplication testing only), KIT, MEN1, MLH1, MSH2, MSH3, MSH6, MUTYH, NBN, NF1, NHTL1, PALB2, PDGFRA, PMS2, POLD1, POLE, PTEN, RAD50, RAD51C, RAD51D, RNF43, SDHB, SDHC, SDHD, SMAD4, SMARCA4. STK11, TP53, TSC1, TSC2, and VHL.  The following genes were evaluated for sequence changes only: SDHA and HOXB13 c.251G>A variant only. The report date is 05/24/2019.   05/26/2019 - 07/09/2019 Chemotherapy   The patient had dexamethasone (DECADRON) 4 MG tablet, 1 of 1 cycle, Start date: 05/09/2019, End date: 07/14/2019 DOXOrubicin (ADRIAMYCIN) chemo injection 108 mg, 60 mg/m2 = 108 mg, Intravenous,  Once, 4 of 4 cycles Administration: 108 mg (05/26/2019), 108 mg (06/09/2019), 108 mg (06/23/2019), 108 mg (07/07/2019) palonosetron (ALOXI) injection 0.25 mg, 0.25 mg, Intravenous,  Once, 4 of 4 cycles Administration: 0.25 mg (05/26/2019), 0.25 mg (06/09/2019), 0.25 mg (06/23/2019), 0.25 mg (07/07/2019) pegfilgrastim-cbqv (UDENYCA) injection 6 mg, 6 mg, Subcutaneous, Once, 4 of 4 cycles Administration: 6 mg (05/28/2019), 6 mg (06/11/2019), 6 mg (06/25/2019), 6 mg (07/09/2019) cyclophosphamide (CYTOXAN) 1,080 mg in sodium chloride 0.9 % 250 mL chemo infusion, 600 mg/m2 = 1,080 mg, Intravenous,  Once, 4 of 4 cycles Administration: 1,080 mg (05/26/2019), 1,080 mg (06/09/2019), 1,080 mg (06/23/2019), 1,080 mg (07/07/2019) fosaprepitant (EMEND) 150 mg, dexamethasone (DECADRON) 12 mg in sodium chloride 0.9 % 145 mL IVPB, , Intravenous,   Once, 4 of 4 cycles Administration:  (05/26/2019),  (06/09/2019),  (06/23/2019),  (07/07/2019)  for chemotherapy treatment.    07/21/2019 - 10/20/2019 Chemotherapy   The patient had dexamethasone (DECADRON) 4 MG tablet, 8 mg, Oral, Daily, 1 of 1 cycle, Start date: --, End date: -- palonosetron (ALOXI) injection 0.25 mg, 0.25 mg, Intravenous,  Once, 11 of 12 cycles Administration: 0.25 mg (07/21/2019), 0.25 mg (07/28/2019), 0.25 mg (08/05/2019), 0.25 mg (08/11/2019), 0.25 mg (08/25/2019), 0.25 mg (09/01/2019), 0.25 mg (09/08/2019), 0.25 mg (09/22/2019), 0.25 mg (09/29/2019), 0.25 mg (10/14/2019), 0.25 mg (10/20/2019) CARBOplatin (PARAPLATIN) 290 mg in sodium chloride 0.9 % 250 mL chemo infusion, 290 mg (100 % of original dose 287.8 mg), Intravenous,  Once, 11 of 12 cycles Dose modification:   (original dose 287.8 mg, Cycle 1) Administration: 290 mg (07/21/2019), 290 mg (07/28/2019), 290 mg (08/05/2019), 290 mg (08/11/2019), 290 mg (08/25/2019), 290 mg (09/01/2019), 290 mg (09/08/2019), 290 mg (09/22/2019), 290 mg (09/29/2019), 290 mg (10/14/2019), 290 mg (10/20/2019) PACLitaxel (TAXOL) 150 mg in sodium chloride 0.9 % 250 mL chemo infusion (</= 40m/m2), 80 mg/m2 = 150 mg, Intravenous,  Once, 11 of 12 cycles Dose modification: 65 mg/m2 (original dose 80 mg/m2, Cycle 6, Reason: Provider Judgment) Administration: 150 mg (07/21/2019), 150 mg (07/28/2019), 150 mg (08/05/2019), 150 mg (08/11/2019), 150 mg (08/25/2019), 120 mg (09/01/2019), 120 mg (09/08/2019), 120 mg (09/22/2019), 120 mg (09/29/2019), 120 mg (10/14/2019), 120 mg (10/20/2019)  for chemotherapy treatment.    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  05/23/2019 Initial Diagnosis   Hereditary breast and ovarian cancer syndrome  associated with mutation in BRCA1 gene     PAST MEDICAL HISTORY: Past Medical History:  Diagnosis Date   Breast cancer ([place])    Cancer ([place]) 05/05/2019   Personal history of chemotherapy    Personal history of  radiation therapy     PAST SURGICAL HISTORY: Past Surgical History:  Procedure Laterality Date   BREAST BIOPSY     BREAST LUMPECTOMY     BREAST LUMPECTOMY WITH RADIOACTIVE SEED AND SENTINEL LYMPH NODE BIOPSY Right 11/17/2019   Procedure: RIGHT BREAST LUMPECTOMY WITH RADIOACTIVE SEED;  Surgeon: Erroll Luna, MD;  Location: [facility];  Service: General;  Laterality: Right;  PEC BLOCK   IR IMAGING GUIDED PORT INSERTION  05/24/2019   IR REMOVAL TUN ACCESS W/ PORT W/O FL MOD SED  07/18/2020   SENTINEL NODE BIOPSY Right 11/17/2019   Procedure: Sentinel Node Biopsy;  Surgeon: Erroll Luna, MD;  Location: [facility];  Service: General;  Laterality: Right;    OB/GYN HISTORY: OB History  Gravida Para Term Preterm AB Living  '2 2 2     2  ' SAB IAB Ectopic Multiple Live Births          2    # Outcome Date GA Lbr Len/2nd Weight Sex Delivery Anes PTL Lv  2 Term      Vag-Spont   LIV  1 Term      Vag-Spont   LIV      Age at menarche: 88 Age at menopause: No menses since 42 Hx of HRT: None Hx of STI: No Last pap: 04/2019 NILM History of abnormal pap smears: no  SCREENING STUDIES:  Last mammogram: MRI breast, 2023 Last colonoscopy: None  MEDICATIONS:  Current Outpatient Medications:    traMADol (ULTRAM) 50 MG tablet, Take 1 tablet (50 mg total) by mouth every 6 (six) hours as needed for moderate pain or severe pain., Disp: 20 tablet, Rfl: 0  ALLERGIES: No Known Allergies  FAMILY HISTORY: Family History  Problem Relation Age of Onset   Breast cancer Mother    Breast cancer Maternal Aunt    Ovarian cancer Neg Hx    Colon cancer Neg Hx    Uterine cancer Neg Hx     SOCIAL HISTORY: Social History   Socioeconomic History   Marital status: Single    Spouse name: Not on file   Number of children: Not on file   Years of education: Not on file   Highest education level: 9th grade  Occupational History   Not on file  Tobacco Use   Smoking status: Never   Smokeless tobacco: Never  Vaping  Use   Vaping Use: Never used  Substance and Sexual Activity   Alcohol use: Not Currently   Drug use: Not Currently   Sexual activity: Yes    Birth control/protection: I.U.D.  Other Topics Concern   Not on file  Social History Narrative   Not on file   Social Determinants of Health   Financial Resource Strain: Not on file  Food Insecurity: Food Insecurity Present (07/18/2021)   Hunger Vital Sign    Worried About Joes in the Last Year: Often true    Ran Out of Food in the Last Year: Often true  Transportation Needs: No Transportation Needs (07/18/2021)   PRAPARE - Hydrologist (Medical): No    Lack of Transportation (Non-Medical): No  Physical Activity: Not on file  Stress: Not on file  Social Connections: Not on file  Intimate Partner Violence: Not on file    REVIEW OF SYSTEMS: New patient intake form was reviewed.  Complete 10-system review is negative except for the following: None  PHYSICAL EXAM: BP (!) 142/91 (BP Location: Left Arm, Patient Position: Sitting)   Pulse 86   Temp 98 F (36.7 C) (Oral)   Resp 18   Ht 5' 1.02" (1.55 m)   Wt 168 lb 12.8 oz (76.6 kg)   SpO2 100%   BMI 31.87 kg/m  Constitutional: No acute distress. Neuro/Psych: Alert, oriented.  Head and Neck: Normocephalic, atraumatic. Neck symmetric without masses. Sclera anicteric.  Respiratory: Normal work of breathing. Clear to auscultation bilaterally. Cardiovascular: Regular rate and rhythm, no murmurs, rubs, or gallops. Abdomen: Normoactive bowel sounds. Soft, non-distended, non-tender to palpation. No masses or hepatosplenomegaly appreciated. No evidence of hernia. No palpable fluid wave.  Extremities: Grossly normal range of motion. Warm, well perfused. No edema bilaterally. Skin: No rashes or lesions. Lymphatic: No cervical, supraclavicular, or inguinal adenopathy. Genitourinary: External genitalia without lesions. Urethral meatus without lesions or  prolapse. On speculum exam, vagina and cervix without lesions. Bimanual exam reveals normal cervix and normal mobile uterus no adnexal masses or tenderness. Exam chaperoned by Kimberly Martinique, [place].  LABORATORY AND RADIOLOGIC DATA: Outside medical records were reviewed to synthesize the above history, along with the history and physical obtained during the visit.  Outside laboratory, pathology, and imaging reports were reviewed, with pertinent results below.  I personally reviewed the outside images.  WBC  Date Value Ref Range Status  10/31/2021 8.5 4.0 - 10.5 K/uL Final   WBC Count  Date Value Ref Range Status  11/05/2021 7.9 4.0 - 10.5 K/uL Final   Hemoglobin  Date Value Ref Range Status  11/05/2021 12.4 12.0 - 15.0 g/dL Final   HCT  Date Value Ref Range Status  11/05/2021 35.3 (L) 36.0 - 46.0 % Final   Platelet Count  Date Value Ref Range Status  11/05/2021 237 150 - 400 K/uL Final   Creatinine  Date Value Ref Range Status  11/05/2021 0.49 0.44 - 1.00 mg/dL Final   AST  Date Value Ref Range Status  11/05/2021 68 (H) 15 - 41 U/L Final   ALT  Date Value Ref Range Status  11/05/2021 156 (H) 0 - 44 U/L Final    NM PET Image Restag (PS) Skull Base To Thigh 11/27/2021  Narrative CLINICAL DATA:  Initial treatment strategy for breast cancer, invasive, stage IV.  EXAM: NUCLEAR MEDICINE PET SKULL BASE TO THIGH  TECHNIQUE: 8.7 mCi F-18 FDG was injected intravenously. Full-ring PET imaging was performed from the skull base to thigh after the radiotracer. CT data was obtained and used for attenuation correction and anatomic localization.  Fasting blood glucose: 96 mg/dl  COMPARISON:  Whole-body bone scan from 11/18/2021. Chest CT 05/25/2019.  FINDINGS: Mediastinal blood pool activity: SUV max 2.7  NECK:  No hypermetabolic cervical lymph nodes are identified.Prominent symmetric activity within the lymphoid tissue of Waldeyer's ring, likely  physiologic/inflammatory. Symmetric tongue activity appears physiologic. No suspicious activity within the pharyngeal mucosal space.  Incidental CT findings: none  CHEST:  There are no hypermetabolic mediastinal, hilar or axillary lymph nodes. No hypermetabolic pulmonary activity or suspicious nodularity. No hypermetabolic activity within the right breast.  Incidental CT findings: Postsurgical changes in the right breast and right axilla without recurrent mass lesion. Mild subpleural scarring anteriorly in the right lung attributed to radiation therapy.  ABDOMEN/PELVIS:  There is no hypermetabolic activity within the liver, adrenal  glands, spleen or pancreas. No suspicious hypermetabolic nodal activity. Small left inguinal lymph node with mild metabolic activity (SUV max 2.8), likely reactive.  Incidental CT findings: Intrauterine device noted.  SKELETON:  There is low-level hypermetabolic activity anterolaterally within the right 7th rib (SUV max 3.6). This corresponds with mild sclerosis on the CT images (image 86/4). This is likely the same area of mildly increased uptake on the recent bone scan. No other abnormal osseous activity.  Incidental CT findings: none  IMPRESSION: 1. Sclerotic lesion of the right 7th rib with low level metabolic activity, favoring a healed fracture or sequela of prior radiation therapy. An isolated rib lesion is unlikely to reflect metastatic disease. Recommend attention on follow-up. 2. No other evidence of chest wall recurrence or metastatic disease status post right lumpectomy and axillary node dissection.   Electronically Signed By: Richardean Sale M.D. On: 11/29/2021 09:17

## 2022-03-17 NOTE — Patient Instructions (Signed)
It was a pleasure to see you in clinic today. -We discussed getting a CA125 and pelvic ultrasound. -I will see you every 6 months.  We will discuss your symptoms and doing exam. -I recommend considering removal of your fallopian tubes and ovaries by age 29-40. - Return visit planned for 6 months  Thank you very much for allowing me to provide care for you today.  I appreciate your confidence in choosing our Gynecologic Oncology team at [facility].  If you have any questions about your visit today please call our office or send [place] a MyChart message and we will get back to you as soon as possible.

## 2022-03-18 ENCOUNTER — Ambulatory Visit (HOSPITAL_COMMUNITY)
Admission: RE | Admit: 2022-03-18 | Discharge: 2022-03-18 | Disposition: A | Discharge status: Home / Self Care | Payer: No Typology Code available for payment source | Source: Ambulatory Visit | Attending: Psychiatry | Admitting: Psychiatry

## 2022-03-18 DIAGNOSIS — Z1509 Genetic susceptibility to other malignant neoplasm: Secondary | ICD-10-CM | POA: Insufficient documentation

## 2022-03-18 DIAGNOSIS — Z1501 Genetic susceptibility to malignant neoplasm of breast: Secondary | ICD-10-CM | POA: Insufficient documentation

## 2022-03-18 LAB — CA 125: Cancer Antigen (CA) 125: 14.4 U/mL (ref 0.0–38.1)

## 2022-03-25 ENCOUNTER — Telehealth: Payer: Self-pay

## 2022-03-25 NOTE — Telephone Encounter (Signed)
Called patient using Brianna Owens ID# [phone]) to advise that the results of both her  CA125 and ultrasound were negative. Reviewed upcoming appointment.

## 2022-04-04 ENCOUNTER — Inpatient Hospital Stay: Payer: No Typology Code available for payment source

## 2022-04-04 VITALS — BP 138/78 | HR 78 | Temp 98.0°F | Resp 18

## 2022-04-04 DIAGNOSIS — Z01419 Encounter for gynecological examination (general) (routine) without abnormal findings: Secondary | ICD-10-CM

## 2022-04-04 DIAGNOSIS — Z95828 Presence of other vascular implants and grafts: Secondary | ICD-10-CM

## 2022-04-04 DIAGNOSIS — Z171 Estrogen receptor negative status [ER-]: Secondary | ICD-10-CM

## 2022-04-04 MED ORDER — LEUPROLIDE ACETATE 3.75 MG IM KIT
3.7500 mg | PACK | Freq: Once | INTRAMUSCULAR | Status: AC
  Administered 2022-04-04: 3.75 mg via INTRAMUSCULAR
  Filled 2022-04-04: qty 3.75

## 2022-04-09 ENCOUNTER — Ambulatory Visit
Admission: RE | Admit: 2022-04-09 | Discharge: 2022-04-09 | Disposition: A | Discharge status: Home / Self Care | Payer: No Typology Code available for payment source | Source: Ambulatory Visit | Attending: Hematology and Oncology | Admitting: Hematology and Oncology

## 2022-04-09 DIAGNOSIS — Z171 Estrogen receptor negative status [ER-]: Secondary | ICD-10-CM

## 2022-05-02 ENCOUNTER — Inpatient Hospital Stay: Payer: No Typology Code available for payment source | Attending: Oncology

## 2022-05-02 ENCOUNTER — Other Ambulatory Visit: Payer: Self-pay

## 2022-05-02 VITALS — BP 138/92 | HR 82 | Temp 98.9°F | Resp 20

## 2022-05-02 DIAGNOSIS — Z171 Estrogen receptor negative status [ER-]: Secondary | ICD-10-CM | POA: Insufficient documentation

## 2022-05-02 DIAGNOSIS — Z01419 Encounter for gynecological examination (general) (routine) without abnormal findings: Secondary | ICD-10-CM

## 2022-05-02 DIAGNOSIS — C50811 Malignant neoplasm of overlapping sites of right female breast: Secondary | ICD-10-CM | POA: Insufficient documentation

## 2022-05-02 DIAGNOSIS — Z95828 Presence of other vascular implants and grafts: Secondary | ICD-10-CM

## 2022-05-02 MED ORDER — LEUPROLIDE ACETATE 3.75 MG IM KIT
3.7500 mg | PACK | Freq: Once | INTRAMUSCULAR | Status: AC
  Administered 2022-05-02: 3.75 mg via INTRAMUSCULAR
  Filled 2022-05-02: qty 3.75

## 2022-05-22 ENCOUNTER — Other Ambulatory Visit: Payer: Self-pay

## 2022-05-22 DIAGNOSIS — Z1502 Genetic susceptibility to malignant neoplasm of ovary: Secondary | ICD-10-CM

## 2022-05-22 DIAGNOSIS — Z171 Estrogen receptor negative status [ER-]: Secondary | ICD-10-CM

## 2022-05-27 ENCOUNTER — Other Ambulatory Visit: Payer: Self-pay | Admitting: *Deleted

## 2022-05-27 DIAGNOSIS — Z1501 Genetic susceptibility to malignant neoplasm of breast: Secondary | ICD-10-CM

## 2022-05-27 DIAGNOSIS — Z171 Estrogen receptor negative status [ER-]: Secondary | ICD-10-CM

## 2022-05-27 MED ORDER — LUPRON DEPOT (1-MONTH) 3.75 MG IM KIT: 3.7500 mg | PACK | INTRAMUSCULAR | 11 refills | Status: AC

## 2022-05-30 ENCOUNTER — Other Ambulatory Visit: Payer: Self-pay

## 2022-05-30 ENCOUNTER — Inpatient Hospital Stay: Payer: No Typology Code available for payment source | Attending: Oncology

## 2022-05-30 VITALS — BP 128/90 | HR 96 | Temp 98.4°F | Resp 18

## 2022-05-30 DIAGNOSIS — C50811 Malignant neoplasm of overlapping sites of right female breast: Secondary | ICD-10-CM | POA: Insufficient documentation

## 2022-05-30 DIAGNOSIS — Z95828 Presence of other vascular implants and grafts: Secondary | ICD-10-CM

## 2022-05-30 DIAGNOSIS — Z01419 Encounter for gynecological examination (general) (routine) without abnormal findings: Secondary | ICD-10-CM

## 2022-05-30 DIAGNOSIS — Z171 Estrogen receptor negative status [ER-]: Secondary | ICD-10-CM | POA: Insufficient documentation

## 2022-05-30 MED ORDER — LEUPROLIDE ACETATE 3.75 MG IM KIT
3.7500 mg | PACK | Freq: Once | INTRAMUSCULAR | Status: AC
  Administered 2022-05-30: 3.75 mg via INTRAMUSCULAR
  Filled 2022-05-30: qty 3.75

## 2022-05-30 NOTE — Patient Instructions (Signed)
Leuprolide Solution for Injection What is this medication? LEUPROLIDE (loo PROE lide) reduces the symptoms of prostate cancer. It works by decreasing levels of the hormone testosterone in the body. This prevents prostate cancer cells from spreading or growing. This medicine may be used for other purposes; ask your health care provider or pharmacist if you have questions. COMMON BRAND NAME(S): Lupron What should I tell my care team before I take this medication? They need to know if you have any of these conditions: Diabetes Heart attack Heart disease High blood pressure High cholesterol Pain or difficulty passing urine Spinal cord metastasis Stroke Tobacco use An unusual or allergic reaction to leuprolide, other medications, foods, dyes, or preservatives Pregnant or trying to get pregnant Breast-feeding How should I use this medication? This medication is for injection under the skin or into a muscle. You will be taught how to prepare and give this medication. Use exactly as directed. Take your medication at regular intervals. Do not take it more often than directed. It is important that you put your used needles and syringes in a special sharps container. Do not put them in a trash can. If you do not have a sharps container, call your care team to get one. A special MedGuide will be given to you by the pharmacist with each prescription and refill. Be sure to read this information carefully each time. Talk to your care team about the use of this medication in children. While this medication may be prescribed for children as young as 8 years for selected conditions, precautions do apply. Overdosage: If you think you have taken too much of this medicine contact a poison control center or emergency room at once. NOTE: This medicine is only for you. Do not share this medicine with others. What if I miss a dose? If you miss a dose, take it as soon as you can. If it is almost time for your next  dose, take only that dose. Do not take double or extra doses. What may interact with this medication? Do not take this medication with any of the following: Chasteberry Cisapride Dronedarone Pimozide Thioridazine This medication may also interact with the following: Estrogen or progestin hormones Herbal or dietary supplements, like black cohosh or DHEA Other medications that cause heart rhythm changes Testosterone This list may not describe all possible interactions. Give your health care provider a list of all the medicines, herbs, non-prescription drugs, or dietary supplements you use. Also tell them if you smoke, drink alcohol, or use illegal drugs. Some items may interact with your medicine. What should I watch for while using this medication? Visit your care team for regular checks on your progress. During the first week, your symptoms may get worse, but then will improve as you continue your treatment. You may get hot flashes, increased bone pain, increased difficulty passing urine, or an aggravation of nerve symptoms. Discuss these effects with your care team, some of them may improve with continued use of this medication. Patients may experience a menstrual cycle or spotting during the first 2 months of therapy with this medication. If this continues, contact your care team. This medication may increase blood sugar. The risk may be higher in patients who already have diabetes. Ask your care team what you can do to lower your risk of diabetes while taking this medication. What side effects may I notice from receiving this medication? Side effects that you should report to your care team as soon as possible: Allergic reactions--skin rash,  itching, hives, swelling of the face, lips, tongue, or throat Heart attack--pain or tightness in the chest, shoulders, arms, or jaw, nausea, shortness of breath, cold or clammy skin, feeling faint or lightheaded Heart rhythm changes--fast or irregular  heartbeat, dizziness, feeling faint or lightheaded, chest pain, trouble breathing High blood sugar (hyperglycemia)--increased thirst or amount of urine, unusual weakness or fatigue, blurry vision Mood swings, irritability, hostility Seizures Stroke--sudden numbness or weakness of the face, arm, or leg, trouble speaking, confusion, trouble walking, loss of balance or coordination, dizziness, severe headache, change in vision Thoughts of suicide or self-harm, worsening mood, feelings of depression Side effects that usually do not require medical attention (report to your care team if they continue or are bothersome): Bone pain Change in sex drive or performance General discomfort and fatigue Hot flashes Muscle pain Pain, redness, or irritation at injection site Swelling of the ankles, hands, or feet This list may not describe all possible side effects. Call your doctor for medical advice about side effects. You may report side effects to FDA at 1-800-FDA-1088. Where should I keep my medication? Keep out of the reach of children and pets. Store below 25 degrees C (77 degrees F). Do not freeze. Protect from light. Get rid of any unused medication after the expiration date. To get rid of medications that are no longer needed or have expired: Take the medication to a medication take-back program. Check with your pharmacy or law enforcement to find a location. If you cannot return the medication, ask your pharmacist or care team how to get rid of this medication safely. NOTE: This sheet is a summary. It may not cover all possible information. If you have questions about this medicine, talk to your doctor, pharmacist, or health care provider.  2023 Elsevier/Gold Standard (2021-05-03 00:00:00)

## 2022-06-27 ENCOUNTER — Inpatient Hospital Stay: Payer: Self-pay | Attending: Oncology

## 2022-06-27 ENCOUNTER — Other Ambulatory Visit: Payer: Self-pay

## 2022-06-27 VITALS — BP 122/70 | HR 90 | Temp 98.2°F | Resp 18

## 2022-06-27 DIAGNOSIS — Z01419 Encounter for gynecological examination (general) (routine) without abnormal findings: Secondary | ICD-10-CM

## 2022-06-27 DIAGNOSIS — Z171 Estrogen receptor negative status [ER-]: Secondary | ICD-10-CM | POA: Insufficient documentation

## 2022-06-27 DIAGNOSIS — Z95828 Presence of other vascular implants and grafts: Secondary | ICD-10-CM

## 2022-06-27 DIAGNOSIS — C50811 Malignant neoplasm of overlapping sites of right female breast: Secondary | ICD-10-CM | POA: Insufficient documentation

## 2022-06-27 MED ORDER — LEUPROLIDE ACETATE 3.75 MG IM KIT
3.7500 mg | PACK | Freq: Once | INTRAMUSCULAR | Status: AC
  Administered 2022-06-27: 3.75 mg via INTRAMUSCULAR
  Filled 2022-06-27: qty 3.75

## 2022-07-25 ENCOUNTER — Other Ambulatory Visit: Payer: Self-pay | Admitting: Adult Health

## 2022-07-25 ENCOUNTER — Other Ambulatory Visit: Payer: Self-pay

## 2022-07-25 ENCOUNTER — Inpatient Hospital Stay: Payer: No Typology Code available for payment source | Attending: Oncology

## 2022-07-25 VITALS — BP 143/93 | HR 95 | Temp 98.0°F | Resp 18

## 2022-07-25 DIAGNOSIS — Z171 Estrogen receptor negative status [ER-]: Secondary | ICD-10-CM | POA: Insufficient documentation

## 2022-07-25 DIAGNOSIS — C50811 Malignant neoplasm of overlapping sites of right female breast: Secondary | ICD-10-CM | POA: Insufficient documentation

## 2022-07-25 MED ORDER — LEUPROLIDE ACETATE 3.75 MG IM KIT
3.7500 mg | PACK | Freq: Once | INTRAMUSCULAR | Status: AC
  Administered 2022-07-25: 3.75 mg via INTRAMUSCULAR
  Filled 2022-07-25: qty 3.75

## 2022-07-25 NOTE — Progress Notes (Signed)
Lupron orders missing, patient has been receiving every 4 weeks,  entered orders per treatment plan as requested by nursing team.    Wilber Bihari, NP 07/25/22 3:34 PM Medical Oncology and Hematology [facility] [place], [place] [address] Tel. [phone]    Fax. [phone]

## 2022-07-25 NOTE — Patient Instructions (Signed)
Leuprolide Solution for Injection Qu es este medicamento? La [place] reduce los sntomas del cncer de prstata. Acta disminuyendo los niveles de la hormona testosterona en el cuerpo. Esto evita que las clulas del cncer de prstata se propaguen o crezcan. Este medicamento puede ser utilizado para otros usos; si tiene alguna pregunta consulte con su proveedor de atencin mdica o con su farmacutico. MARCAS COMUNES: Lupron Qu le debo informar a mi profesional de la salud antes de tomar este medicamento? Necesitan saber si usted presenta alguno de los siguientes problemas o situaciones: Diabetes Ataque cardiaco Enfermedad cardiaca Presin arterial alta Nivel de colesterol alto Dolor o dificultad para orinar Metstasis en la mdula espinal Accidente cerebrovascular Uso de tabaco Una reaccin alrgica o inusual a la leuprolida, a otros medicamentos, alimentos, colorantes o conservantes Si est embarazada o buscando quedar embarazada Si est amamantando a un beb Cmo debo utilizar este medicamento? Este medicamento se inyecta bajo la piel o en un msculo. Le ensearn cmo preparar y administrar este medicamento. Use el medicamento exactamente como se le indique. Use su medicamento a intervalos regulares. No lo use con una frecuencia mayor a la indicada. Es importante que deseche las agujas y las jeringas usadas en un recipiente resistente a los pinchazos. No las deseche en la basura. Si no tiene un recipiente resistente a los pinchazos, llame a su equipo de atencin para obtenerlo. Su farmacutico le dar una Gua del medicamento especial (MedGuide, nombre en ingls) con cada receta y en cada ocasin que la vuelva a surtir. Asegrese de leer esta informacin cada vez cuidadosamente. Hable con su equipo de atencin sobre el uso de este medicamento en nios. Aunque este medicamento se puede recetar a nios tan pequeos como de 8 aos de edad con ciertas afecciones, existen precauciones  que deben tomarse. Sobredosis: Pngase en contacto inmediatamente con un centro toxicolgico o una sala de urgencia si usted cree que haya tomado demasiado medicamento. ATENCIN: Este medicamento es solo para usted. No comparta este medicamento con nadie. Qu sucede si me olvido de una dosis? Si olvida una dosis, adminstrela lo antes posible. Si es casi la hora de la prxima dosis, administre solo esa dosis. No se administre dosis adicionales o dobles. Qu puede interactuar con este medicamento? No use este medicamento con ninguno de los siguientes productos: [place] Cisaprida Dronedarona Pimozida Tioridazina Este medicamento tambin podra interactuar con los siguientes productos: Estrgeno o progestina Suplementos dietticos o a base de hierbas, tales como cohosh negro o DHEA Otros medicamentos que causan cambios en el ritmo cardiaco Testosterona Puede ser que esta lista no menciona todas las posibles interacciones. Informe a su profesional de la salud de todos los productos a base de hierbas, medicamentos de venta libre o suplementos nutritivos que est tomando. Si usted fuma, consume bebidas alcohlicas o si utiliza drogas ilegales, indqueselo tambin a su profesional de la salud. Algunas sustancias pueden interactuar con su medicamento. A qu debo estar atento al usar este medicamento? Visite a su equipo de atencin para que revise su evolucin peridicamente. Durante la primera semana, sus sntomas podran empeorar, pero luego mejorarn a medida que contine su tratamiento. Es posible que tenga sofocos, dolor seo ms intenso, mayor dificultad para orinar o un agravamiento de los sntomas neurolgicos. Hable sobre estos efectos con su equipo de atencin; algunos de ellos pueden mejorar con el uso continuo de este medicamento. Las pacientes pueden experimentar un ciclo menstrual o sangrado ligero entre periodos menstruales durante los primeros 2 meses de tratamiento con este    medicamento. Si esto contina, contacte a su equipo de atencin. Este medicamento puede aumentar los niveles de azcar en la sangre. El riesgo podra ser mayor en pacientes que ya tienen diabetes. Pregntele a su equipo de atencin qu puede hacer para reducir el riesgo de diabetes mientras est usando este medicamento. Qu efectos secundarios puedo tener al utilizar este medicamento? Efectos secundarios que debe informar a su equipo de atencin tan pronto como sea posible: Reacciones alrgicas: erupcin cutnea, comezn/picazn, urticaria, hinchazn de la cara, los labios, la lengua o la garganta Ataque cardiaco: dolor u opresin en el pecho, los hombros, los brazos o la mandbula, nuseas, falta de aire, piel fra o sudorosa, sensacin de desmayo o aturdimiento Cambios en el ritmo cardiaco: frecuencia cardiaca rpida o irregular, mareos, sensacin de desmayo o aturdimiento, dolor en el pecho, dificultad para respirar Niveles elevados de azcar en la sangre (hiperglucemia): aumento de la sed o de la cantidad de orina, debilidad inusual, fatiga, visin borrosa Cambios en el estado de nimo, irritabilidad, hostilidad Convulsiones Accidente cerebrovascular: entumecimiento o debilidad repentinos de la cara, un brazo o una pierna, dificultad para hablar, confusin, dificultad para caminar, prdida de equilibrio o coordinacin, mareos, dolor de cabeza intenso, cambio en la visin Ideas suicidas o de autolesionarse, empeoramiento del estado de nimo, sentimientos de depresin Efectos secundarios que generalmente no requieren atencin mdica (debe informarlos a su equipo de atencin si persisten o si son molestos): Dolor de huesos Cambios en el deseo o desempeo sexual Malestar general y fatiga Sofocos Dolor muscular Dolor, enrojecimiento o irritacin en el lugar de la inyeccin Hinchazn de los tobillos, las manos o los pies Puede ser que esta lista no menciona todos los posibles efectos secundarios.  Comunquese a su mdico por asesoramiento mdico sobre los efectos secundarios. Usted puede informar los efectos secundarios a la FDA por telfono al 1-800-FDA-1088. Dnde debo guardar mi medicina? Mantenga fuera del alcance de nios y mascotas. Guarde a una temperatura inferior a 25 grados Celsius (77 grados Fahrenheit). No congele. Proteja de la luz. Deseche todo el medicamento que no haya utilizado despus de la fecha de vencimiento. Para desechar los medicamentos que ya no necesite o que estn vencidos: Lleve el medicamento a un programa de recuperacin de medicamentos. Consulte con su farmacia o con una entidad reguladora para encontrar un lugar donde llevarlo. Si no puede devolver el medicamento, pregntele a su farmacutico o a su equipo de atencin cmo desecharlo de manera segura. ATENCIN: Este folleto es un resumen. Puede ser que no cubra toda la posible informacin. Si usted tiene preguntas acerca de esta medicina, consulte con su mdico, su farmacutico o su profesional de la salud.  2023 Elsevier/Gold Standard (2021-10-30 00:00:00)  

## 2022-08-13 ENCOUNTER — Telehealth: Payer: Self-pay | Admitting: Hematology and Oncology

## 2022-08-13 NOTE — Telephone Encounter (Signed)
Called patient to inform about appointment change. Unable to leave message vm not set up

## 2022-08-22 ENCOUNTER — Inpatient Hospital Stay: Payer: No Typology Code available for payment source | Admitting: Hematology and Oncology

## 2022-08-22 ENCOUNTER — Other Ambulatory Visit: Payer: Self-pay

## 2022-08-22 ENCOUNTER — Inpatient Hospital Stay: Payer: No Typology Code available for payment source | Attending: Oncology

## 2022-08-22 VITALS — BP 144/86 | HR 98 | Temp 97.4°F | Resp 18

## 2022-08-22 DIAGNOSIS — Z8041 Family history of malignant neoplasm of ovary: Secondary | ICD-10-CM | POA: Insufficient documentation

## 2022-08-22 DIAGNOSIS — Z171 Estrogen receptor negative status [ER-]: Secondary | ICD-10-CM | POA: Insufficient documentation

## 2022-08-22 DIAGNOSIS — Z803 Family history of malignant neoplasm of breast: Secondary | ICD-10-CM | POA: Insufficient documentation

## 2022-08-22 DIAGNOSIS — Z1502 Genetic susceptibility to malignant neoplasm of ovary: Secondary | ICD-10-CM | POA: Insufficient documentation

## 2022-08-22 DIAGNOSIS — Z1501 Genetic susceptibility to malignant neoplasm of breast: Secondary | ICD-10-CM | POA: Insufficient documentation

## 2022-08-22 DIAGNOSIS — C50811 Malignant neoplasm of overlapping sites of right female breast: Secondary | ICD-10-CM | POA: Insufficient documentation

## 2022-08-22 MED ORDER — LEUPROLIDE ACETATE 3.75 MG IM KIT
3.7500 mg | PACK | Freq: Once | INTRAMUSCULAR | Status: AC
  Administered 2022-08-22: 3.75 mg via INTRAMUSCULAR
  Filled 2022-08-22: qty 3.75

## 2022-09-03 ENCOUNTER — Other Ambulatory Visit: Payer: Self-pay

## 2022-09-03 ENCOUNTER — Inpatient Hospital Stay (HOSPITAL_BASED_OUTPATIENT_CLINIC_OR_DEPARTMENT_OTHER): Payer: No Typology Code available for payment source | Admitting: Hematology and Oncology

## 2022-09-03 VITALS — BP 141/89 | HR 93 | Temp 97.7°F | Resp 16 | Ht 61.0 in | Wt 168.1 lb

## 2022-09-03 DIAGNOSIS — Z803 Family history of malignant neoplasm of breast: Secondary | ICD-10-CM

## 2022-09-03 DIAGNOSIS — Z171 Estrogen receptor negative status [ER-]: Secondary | ICD-10-CM

## 2022-09-03 DIAGNOSIS — C50811 Malignant neoplasm of overlapping sites of right female breast: Secondary | ICD-10-CM

## 2022-09-03 NOTE — Assessment & Plan Note (Signed)
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  Once again given the BRCA1 mutation, she is on intensified breast ca screening and ovarian cancer screening with TVU as well as tumor markers. She wants more kids, not ready for BSO. Last mammogram neg for malignancy.  She will continue follow-up with gynecology for ovarian cancer screening.  With regards to her Lupron monthly, I have sent an in basket message to Dr. Ernestina Patches as well if there is any role for continuing this.  She wants to have another child she states hence we may want to discontinue this if she wants to proceed with childbearing. MR breast ordered, due now. No concerns on PE RTC in 6 months or sooner as needed.

## 2022-09-03 NOTE — Progress Notes (Signed)
[facility] Follow up:    Bernadene Bell, MD [address]  [place] [address]   DIAGNOSIS:  Cancer Staging  No matching staging information was found for the patient.  SUMMARY OF ONCOLOGIC HISTORY: 32 y.o. BRCA1 positive [name] woman status post right breast overlapping sites biopsy 05/04/2019 for a clinical T3 N0, stage IIIB invasive ductal carcinoma, grade 2, triple negative, with an MIB-1 of 40%.             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  The Common Hereditary Gene Panel offered by [facility] includes sequencing and/or deletion duplication testing of the following 48 genes: APC, ATM, AXIN2, BARD1, BMPR1A, BRCA1, BRCA2, BRIP1, CDH1, CDK4, CDKN2A (p14ARF), CDKN2A (p16INK4a), CHEK2, CTNNA1, DICER1, EPCAM (Deletion/duplication testing only), GREM1 (promoter region deletion/duplication testing only), KIT, MEN1, MLH1, MSH2, MSH3, MSH6, MUTYH, NBN, NF1, NHTL1, PALB2, PDGFRA, PMS2, POLD1, POLE, PTEN, RAD50, RAD51C, RAD51D, RNF43, SDHB, SDHC, SDHD, SMAD4, SMARCA4. STK11, TP53, TSC1, TSC2, and VHL.  The following genes were evaluated for sequence changes only: SDHA and HOXB13 c.251G>A variant only. The report date is 05/24/2019.             (b) recommended bilateral mastectomies, due to potential barriers/risks with age and intensified screening   (2) neoadjuvant chemotherapy consisting of doxorubicin and cyclophosphamide in dose dense fashion x4 started 05/26/2019, completed 07/07/2019, followed by paclitaxel and carboplatin weekly x12 started 07/21/2019             (a) breast MRI 09/26/2019 shows a complete radiologic response   (3) status post right lumpectomy 11/17/2019 showing a complete pathologic response [ypT0 ypN0]             (a) a total of 5 axillary  lymph nodes were removed   (4) adjuvant radiation   Radiation Treatment Dates: 01/02/2020 through 02/15/2020 Site Technique Total Dose (Gy) Dose per Fx (Gy) Completed Fx Beam Energies  Breast, Right: Breast_Rt 3D 50.4/50.4 1.8 28/28 6X, 10X  Breast, Right: Breast_Rt_Bst 3D 10/10 2 5/5 6X  Sclav-RT: SCV_Rt 3D 50.4/50.4 1.8 28/28 6X, 10X      (5) considering bilateral salpingo-oophorectomy for ovarian cancer prevention             (a) receiving leuprolide/Lupron every 28 days   CURRENT THERAPY: observation  INTERVAL HISTORY:  Brianna Owens 32 y.o. female returns for f/u  She arrived today with a certified Spanish interpreter.  Since her last visit, she denies any new health complaints.  She has been feeling quite well for the most part.  She is still has not established with a gynecologist.  She tells me that she forgot.  She denies any changes in her breast.  She has her mammogram scheduled in October.  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  MEDICAL HISTORY: Past  Medical History:  Diagnosis Date   Breast cancer ([facility])    Cancer ([place]) 05/05/2019   Personal history of chemotherapy    Personal history of radiation therapy     SURGICAL HISTORY: Past Surgical History:  Procedure Laterality Date   BREAST BIOPSY     BREAST LUMPECTOMY     BREAST LUMPECTOMY WITH RADIOACTIVE SEED AND SENTINEL LYMPH NODE BIOPSY Right 11/17/2019   Procedure: RIGHT BREAST LUMPECTOMY WITH RADIOACTIVE SEED;  Surgeon: Erroll Luna, MD;   Location: [facility];  Service: General;  Laterality: Right;  PEC BLOCK   IR IMAGING GUIDED PORT INSERTION  05/24/2019   IR REMOVAL TUN ACCESS W/ PORT W/O FL MOD SED  07/18/2020   SENTINEL NODE BIOPSY Right 11/17/2019   Procedure: Sentinel Node Biopsy;  Surgeon: Erroll Luna, MD;  Location: [facility];  Service: General;  Laterality: Right;    SOCIAL HISTORY: Social History   Socioeconomic History   Marital status: Single    Spouse name: Not on file   Number of children: Not on file   Years of education: Not on file   Highest education level: 9th grade  Occupational History   Not on file  Tobacco Use   Smoking status: Never   Smokeless tobacco: Never  Vaping Use   Vaping Use: Never used  Substance and Sexual Activity   Alcohol use: Not Currently   Drug use: Not Currently   Sexual activity: Yes    Birth control/protection: I.U.D.  Other Topics Concern   Not on file  Social History Narrative   Not on file   Social Determinants of Health   Financial Resource Strain: Not on file  Food Insecurity: Food Insecurity Present (07/18/2021)   Hunger Vital Sign    Worried About [place] in the Last Year: Often true    Ran Out of Food in the Last Year: Often true  Transportation Needs: No Transportation Needs (07/18/2021)   PRAPARE - Hydrologist (Medical): No    Lack of Transportation (Non-Medical): No  Physical Activity: Not on file  Stress: Not on file  Social Connections: Not on file  Intimate Partner Violence: Not on file    FAMILY HISTORY: Family History  Problem Relation Age of Onset   Breast cancer Mother    Breast cancer Maternal Aunt    Ovarian cancer Neg Hx    Colon cancer Neg Hx    Uterine cancer Neg Hx     Review of Systems  Constitutional:  Negative for appetite change, chills, fatigue, fever and unexpected weight change.  HENT:   Negative for hearing loss, lump/mass and trouble swallowing.   Eyes:  Negative for eye problems and  icterus.  Respiratory:  Negative for chest tightness, cough and shortness of breath.   Cardiovascular:  Negative for chest pain, leg swelling and palpitations.  Gastrointestinal:  Negative for abdominal distention, abdominal pain, constipation, diarrhea, nausea and vomiting.  Endocrine: Negative for hot flashes.  Genitourinary:  Negative for difficulty urinating.   Musculoskeletal:  Negative for arthralgias.  Skin:  Negative for itching and rash.  Neurological:  Negative for dizziness, extremity weakness, headaches and numbness.  Hematological:  Negative for adenopathy. Does not bruise/bleed easily.  Psychiatric/Behavioral:  Negative for depression. The patient is not nervous/anxious.       PHYSICAL EXAMINATION  ECOG PERFORMANCE STATUS: 1 - Symptomatic but completely ambulatory  Vitals:   09/03/22 1446  BP: (!) 141/89  Pulse: 93  Resp: 16  Temp: 97.7 F (36.5 C)  SpO2: 100%   Physical Exam Constitutional:      Appearance: Normal appearance.  Chest:     Comments: Both breasts inspected and palpated.  No palpable masses or regional adenopathy Musculoskeletal:     Cervical back: Normal range of motion and neck supple. No rigidity.  Lymphadenopathy:     Cervical: No cervical adenopathy.  Neurological:     Mental Status: She is alert.      LABORATORY DATA:  CBC    Component Value Date/Time   WBC 7.9 11/05/2021 1203   WBC 8.5 10/31/2021 1101   RBC 3.93 11/05/2021 1203   HGB 12.4 11/05/2021 1203   HCT 35.3 (L) 11/05/2021 1203   PLT 237 11/05/2021 1203   MCV 89.8 11/05/2021 1203   MCH 31.6 11/05/2021 1203   MCHC 35.1 11/05/2021 1203   RDW 12.0 11/05/2021 1203   LYMPHSABS 2.5 11/05/2021 1203   MONOABS 0.6 11/05/2021 1203   EOSABS 0.2 11/05/2021 1203   BASOSABS 0.0 11/05/2021 1203    CMP     Component Value Date/Time   NA 138 11/05/2021 1203   K 3.8 11/05/2021 1203   CL 104 11/05/2021 1203   CO2 28 11/05/2021 1203   GLUCOSE 104 (H) 11/05/2021 1203   BUN 9  11/05/2021 1203   CREATININE 0.49 11/05/2021 1203   CALCIUM 10.0 11/05/2021 1203   PROT 8.1 11/05/2021 1203   ALBUMIN 4.3 11/05/2021 1203   AST 68 (H) 11/05/2021 1203   ALT 156 (H) 11/05/2021 1203   ALKPHOS 101 11/05/2021 1203   BILITOT 0.3 11/05/2021 1203   GFRNONAA >60 11/05/2021 1203   GFRAA >60 03/16/2020 1011   GFRAA >60 07/21/2019 1025    ASSESSMENT and THERAPY PLAN:   Malignant neoplasm of overlapping sites of right breast in female, estrogen receptor negative [facility]) The patient is a 32 year old Spanish-speaking woman with 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  Once again given the BRCA1 mutation, she is on intensified breast ca screening and ovarian cancer screening with TVU as well as tumor markers. She wants more kids, not ready for BSO. Last mammogram neg for malignancy.  She will continue follow-up with gynecology for ovarian cancer screening.  With regards to her Lupron monthly, I have sent an in basket message to Dr. Ernestina Patches as well if there is any role for continuing this.  She wants to have another child she states hence we may want to discontinue this if she wants to proceed with childbearing. MR breast ordered, due now. No concerns on PE RTC in 6 months or sooner as needed.   All questions were answered. The patient knows to call the clinic with any problems, questions or concerns. We can certainly see the patient much sooner if necessary.  Total encounter time:30 minutes*in face-to-face visit time, chart review, lab review, care coordination, order entry, and documentation of the encounter time.  *Total Encounter Time as defined by the Centers for Medicare and Medicaid Services includes, in addition to the face-to-face time of a patient visit (documented in the note above) non-face-to-face time: obtaining and reviewing outside history, ordering  and reviewing medications, tests or procedures, care coordination (communications with other health care professionals or caregivers) and documentation in the medical record.

## 2022-09-19 ENCOUNTER — Ambulatory Visit: Payer: No Typology Code available for payment source

## 2022-10-17 ENCOUNTER — Ambulatory Visit: Payer: No Typology Code available for payment source

## 2022-11-14 ENCOUNTER — Ambulatory Visit: Payer: No Typology Code available for payment source

## 2022-12-12 ENCOUNTER — Ambulatory Visit: Payer: No Typology Code available for payment source

## 2023-01-09 ENCOUNTER — Ambulatory Visit: Payer: No Typology Code available for payment source

## 2023-01-20 ENCOUNTER — Encounter: Payer: Self-pay | Admitting: *Deleted

## 2023-01-21 ENCOUNTER — Inpatient Hospital Stay: Payer: No Typology Code available for payment source | Attending: Physician Assistant | Admitting: Physician Assistant

## 2023-01-21 VITALS — BP 132/93 | HR 83 | Temp 98.1°F | Resp 16 | Wt 172.5 lb

## 2023-01-21 DIAGNOSIS — N644 Mastodynia: Secondary | ICD-10-CM | POA: Insufficient documentation

## 2023-01-21 DIAGNOSIS — C50811 Malignant neoplasm of overlapping sites of right female breast: Secondary | ICD-10-CM

## 2023-01-21 DIAGNOSIS — Z853 Personal history of malignant neoplasm of breast: Secondary | ICD-10-CM | POA: Insufficient documentation

## 2023-01-21 DIAGNOSIS — Z171 Estrogen receptor negative status [ER-]: Secondary | ICD-10-CM

## 2023-01-21 DIAGNOSIS — Z1501 Genetic susceptibility to malignant neoplasm of breast: Secondary | ICD-10-CM | POA: Insufficient documentation

## 2023-01-21 DIAGNOSIS — Z803 Family history of malignant neoplasm of breast: Secondary | ICD-10-CM | POA: Insufficient documentation

## 2023-01-21 DIAGNOSIS — Z1502 Genetic susceptibility to malignant neoplasm of ovary: Secondary | ICD-10-CM | POA: Insufficient documentation

## 2023-01-21 NOTE — Progress Notes (Unsigned)
Symptom Management Consult Note [facility]    Patient Care Team: Clide Cliff, MD as PCP - General (Gynecologic Oncology) Harriette Bouillon, MD as Consulting Physician (General Surgery) Pershing Proud, RN as Oncology Nurse Navigator Donnelly Angelica, RN as Oncology Nurse Navigator Lonie Peak, MD as Attending Physician (Radiation Oncology) Rachel Moulds, MD as Consulting Physician (Hematology and Oncology) Rachel Moulds, MD as Consulting Physician (Hematology and Oncology)    Name / MRN / DOB: Brianna Owens  [id number]  01/18/1991   Date of visit: 01/21/2023   Chief Complaint/Reason for visit: pain   Current Therapy: Lupron  Last treatment:  08/22/22   ASSESSMENT & PLAN: Patient is a 32 y.o. 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 Al Pimple.  I have viewed most recent oncology note and lab work.    #stage IIIb invasive ductal carcinoma, grade 2, triple negative  - Next appointment with oncologist is   #       Heme/Onc History: Oncology History  Malignant neoplasm of overlapping sites of right breast in female, estrogen receptor negative (HCC)  05/08/2019 Initial Diagnosis   Malignant neoplasm of overlapping sites of right breast in female, estrogen receptor negative (HCC)   05/24/2019 Genetic Testing   BRCA1 [id number] (p.[id number]) pathogenic variant identified on the common hereditary cancer panel.  The Common Hereditary Gene Panel offered by [facility] includes sequencing and/or deletion duplication testing of the following 48 genes: APC, ATM, AXIN2, BARD1, BMPR1A, BRCA1, BRCA2, BRIP1, CDH1, CDK4, CDKN2A (p14ARF), CDKN2A (p16INK4a), CHEK2, CTNNA1, DICER1, EPCAM (Deletion/duplication testing only), GREM1 (promoter region deletion/duplication testing only), KIT, MEN1, MLH1, MSH2, MSH3, MSH6, MUTYH, NBN, NF1, NHTL1, PALB2, PDGFRA, PMS2, POLD1, POLE,  PTEN, RAD50, RAD51C, RAD51D, RNF43, SDHB, SDHC, SDHD, SMAD4, SMARCA4. STK11, TP53, TSC1, TSC2, and VHL.  The following genes were evaluated for sequence changes only: SDHA and HOXB13 c.251G>A variant only. The report date is 05/24/2019.   05/26/2019 - 07/09/2019 Chemotherapy   The patient had dexamethasone (DECADRON) 4 MG tablet, 1 of 1 cycle, Start date: 05/09/2019, End date: 07/14/2019 DOXOrubicin (ADRIAMYCIN) chemo injection 108 mg, 60 mg/m2 = 108 mg, Intravenous,  Once, 4 of 4 cycles Administration: 108 mg (05/26/2019), 108 mg (06/09/2019), 108 mg (06/23/2019), 108 mg (07/07/2019) palonosetron (ALOXI) injection 0.25 mg, 0.25 mg, Intravenous,  Once, 4 of 4 cycles Administration: 0.25 mg (05/26/2019), 0.25 mg (06/09/2019), 0.25 mg (06/23/2019), 0.25 mg (07/07/2019) pegfilgrastim-cbqv (UDENYCA) injection 6 mg, 6 mg, Subcutaneous, Once, 4 of 4 cycles Administration: 6 mg (05/28/2019), 6 mg (06/11/2019), 6 mg (06/25/2019), 6 mg (07/09/2019) cyclophosphamide (CYTOXAN) 1,080 mg in sodium chloride 0.9 % 250 mL chemo infusion, 600 mg/m2 = 1,080 mg, Intravenous,  Once, 4 of 4 cycles Administration: 1,080 mg (05/26/2019), 1,080 mg (06/09/2019), 1,080 mg (06/23/2019), 1,080 mg (07/07/2019) fosaprepitant (EMEND) 150 mg, dexamethasone (DECADRON) 12 mg in sodium chloride 0.9 % 145 mL IVPB, , Intravenous,  Once, 4 of 4 cycles Administration:  (05/26/2019),  (06/09/2019),  (06/23/2019),  (07/07/2019)  for chemotherapy treatment.    07/21/2019 - 10/20/2019 Chemotherapy   The patient had dexamethasone (DECADRON) 4 MG tablet, 8 mg, Oral, Daily, 1 of 1 cycle, Start date: --, End date: -- palonosetron (ALOXI) injection 0.25 mg, 0.25 mg, Intravenous,  Once, 11 of 12 cycles Administration: 0.25 mg (07/21/2019), 0.25 mg (07/28/2019), 0.25 mg (08/05/2019), 0.25 mg (08/11/2019), 0.25 mg (08/25/2019), 0.25 mg (09/01/2019), 0.25 mg (09/08/2019), 0.25 mg (09/22/2019), 0.25 mg (09/29/2019), 0.25 mg (  10/14/2019), 0.25 mg (10/20/2019) CARBOplatin (PARAPLATIN)  290 mg in sodium chloride 0.9 % 250 mL chemo infusion, 290 mg (100 % of original dose 287.8 mg), Intravenous,  Once, 11 of 12 cycles Dose modification:   (original dose 287.8 mg, Cycle 1) Administration: 290 mg (07/21/2019), 290 mg (07/28/2019), 290 mg (08/05/2019), 290 mg (08/11/2019), 290 mg (08/25/2019), 290 mg (09/01/2019), 290 mg (09/08/2019), 290 mg (09/22/2019), 290 mg (09/29/2019), 290 mg (10/14/2019), 290 mg (10/20/2019) PACLitaxel (TAXOL) 150 mg in sodium chloride 0.9 % 250 mL chemo infusion (</= 80mg /m2), 80 mg/m2 = 150 mg, Intravenous,  Once, 11 of 12 cycles Dose modification: 65 mg/m2 (original dose 80 mg/m2, Cycle 6, Reason: Provider Judgment) Administration: 150 mg (07/21/2019), 150 mg (07/28/2019), 150 mg (08/05/2019), 150 mg (08/11/2019), 150 mg (08/25/2019), 120 mg (09/01/2019), 120 mg (09/08/2019), 120 mg (09/22/2019), 120 mg (09/29/2019), 120 mg (10/14/2019), 120 mg (10/20/2019)  for chemotherapy treatment.    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 female with oncologic history as above presenting to [facility] today with chief complaint of      ROS  All other systems are reviewed and are negative for acute change except as noted in the HPI.    No Known Allergies   Past Medical History:  Diagnosis Date  . Breast cancer (HCC)   . Cancer (HCC) 05/05/2019  . Personal history of chemotherapy   . Personal history of radiation therapy      Past Surgical History:  Procedure Laterality Date  . BREAST BIOPSY    . BREAST LUMPECTOMY    . BREAST LUMPECTOMY WITH RADIOACTIVE SEED AND SENTINEL LYMPH NODE BIOPSY Right 11/17/2019   Procedure: RIGHT BREAST LUMPECTOMY WITH RADIOACTIVE SEED;  Surgeon: Harriette Bouillon, MD;  Location: [facility] OR;  Service: General;  Laterality: Right;  PEC  BLOCK  . IR IMAGING GUIDED PORT INSERTION  05/24/2019  . IR REMOVAL TUN ACCESS W/ PORT W/O FL MOD SED  07/18/2020  . SENTINEL NODE BIOPSY Right 11/17/2019   Procedure: Sentinel Node Biopsy;  Surgeon: Harriette Bouillon, MD;  Location: [facility] OR;  Service: General;  Laterality: Right;    Social History   Socioeconomic History  . Marital status: Single    Spouse name: Not on file  . Number of children: Not on file  . Years of education: Not on file  . Highest education level: 9th grade  Occupational History  . Not on file  Tobacco Use  . Smoking status: Never  . Smokeless tobacco: Never  Vaping Use  . Vaping status: Never Used  Substance and Sexual Activity  . Alcohol use: Not Currently  . Drug use: Not Currently  . Sexual activity: Yes    Birth control/protection: I.U.D.  Other Topics Concern  . Not on file  Social History Narrative  . Not on file   Social Determinants of Health   Financial Resource Strain: Not on file  Food Insecurity: Food Insecurity Present (07/18/2021)   Hunger Vital Sign   . Worried About Programme researcher, broadcasting/film/video in the Last Year: Often true   . Ran Out of Food in the Last Year: Often true  Transportation Needs: No Transportation Needs (07/18/2021)   PRAPARE - Transportation   . Lack of Transportation (Medical): No   . Lack of Transportation (Non-Medical):  No  Physical Activity: Not on file  Stress: Not on file  Social Connections: Unknown (11/02/2021)   [facility], [facility]   Social Network   . Social Network: Not on file  Intimate Partner Violence: Unknown (11/02/2021)   [facility] from [facility], [facility]   HITS   . Physically Hurt: Not on file   . Insult or Talk Down To: Not on file   . Threaten Physical Harm: Not on file   . Scream or Curse: Not on file    Family History  Problem Relation Age of Onset  . Breast cancer Mother   . Breast cancer Maternal Aunt   . Ovarian cancer Neg Hx   . Colon cancer Neg Hx   . Uterine  cancer Neg Hx      Current Outpatient Medications:  .  leuprolide (LUPRON DEPOT, 62-MONTH,) 3.75 MG injection, Inject 3.75 mg into the muscle every 28 (twenty-eight) days for 12 doses., Disp: 1 each, Rfl: 11 .  diclofenac (VOLTAREN) 75 MG EC tablet, Take by mouth., Disp: , Rfl:  .  traMADol (ULTRAM) 50 MG tablet, Take 1 tablet (50 mg total) by mouth every 6 (six) hours as needed for moderate pain or severe pain., Disp: 20 tablet, Rfl: 0  PHYSICAL EXAM: ECOG FS:{CHL ONC [id number]}   There were no vitals filed for this visit. Physical Exam     LABORATORY DATA: I have reviewed the data as listed    Latest Ref Rng & Units 11/05/2021   12:03 PM 10/31/2021   11:01 AM 07/17/2021    2:44 PM  CBC  WBC 4.0 - 10.5 K/uL 7.9  8.5  8.8   Hemoglobin 12.0 - 15.0 g/dL 60.6  30.1  60.1   Hematocrit 36.0 - 46.0 % 35.3  37.8  35.6   Platelets 150 - 400 K/uL 237  255  250         Latest Ref Rng & Units 11/05/2021   12:03 PM 10/31/2021   11:01 AM 07/17/2021    2:44 PM  CMP  Glucose 70 - 99 mg/dL 093  235  573   BUN 6 - 20 mg/dL 9  10  11    Creatinine 0.44 - 1.00 mg/dL 2.20  2.54  2.70   Sodium 135 - 145 mmol/L 138  139  137   Potassium 3.5 - 5.1 mmol/L 3.8  4.0  3.8   Chloride 98 - 111 mmol/L 104  105  103   CO2 22 - 32 mmol/L 28  25  24    Calcium 8.9 - 10.3 mg/dL 62.3  9.5  9.3   Total Protein 6.5 - 8.1 g/dL 8.1  8.0  8.3   Total Bilirubin 0.3 - 1.2 mg/dL 0.3  0.7  0.3   Alkaline Phos 38 - 126 U/L 101  97  130   AST 15 - 41 U/L 68  78  70   ALT 0 - 44 U/L 156  142  141        RADIOGRAPHIC STUDIES (from last 24 hours if applicable) I have personally reviewed the radiological images as listed and agreed with the findings in the report. No results found.      Visit Diagnosis: No diagnosis found.   No orders of the defined types were placed in this encounter.   All questions were answered. The patient knows to call the clinic with any problems, questions or concerns. No  barriers to learning was detected.  A total  of more than *** minutes were spent on this encounter with face-to-face time and non-face-to-face time, including preparing to see the patient, ordering tests and/or medications, counseling the patient and coordination of care as outlined above.    Thank you for allowing me to participate in the care of this patient.    Shanon Ace, PA-C Department of Hematology/Oncology [facility] Phone: [phone]  [phone]    01/21/2023 11:23 AM

## 2023-01-22 ENCOUNTER — Ambulatory Visit (HOSPITAL_COMMUNITY): Admission: RE | Admit: 2023-01-22 | Payer: Self-pay | Source: Ambulatory Visit

## 2023-01-28 ENCOUNTER — Telehealth: Payer: Self-pay

## 2023-01-28 NOTE — Telephone Encounter (Signed)
This RN called patient regarding missed MRI appointment using [employer]. Per patient, her MRI scan was cancelled due to the rain last week and she was unable to reschedule due to the language barrier. This RN rescheduled MRI for 8/19 at 0830- patient verbalized understanding to arrive at [facility] main entrance at 0800 8/19. No questions or concerns at this time- patient given central scheduling number should she need to reschedule.

## 2023-02-02 ENCOUNTER — Ambulatory Visit (HOSPITAL_COMMUNITY)
Admission: RE | Admit: 2023-02-02 | Discharge: 2023-02-02 | Disposition: A | Discharge status: Home / Self Care | Payer: Self-pay | Source: Ambulatory Visit | Attending: Physician Assistant | Admitting: Physician Assistant

## 2023-02-02 ENCOUNTER — Telehealth: Payer: Self-pay

## 2023-02-02 DIAGNOSIS — C50811 Malignant neoplasm of overlapping sites of right female breast: Secondary | ICD-10-CM | POA: Insufficient documentation

## 2023-02-02 DIAGNOSIS — Z171 Estrogen receptor negative status [ER-]: Secondary | ICD-10-CM | POA: Insufficient documentation

## 2023-02-02 MED ORDER — GADOBUTROL 1 MMOL/ML IV SOLN
8.0000 mL | Freq: Once | INTRAVENOUS | Status: AC | PRN
  Administered 2023-02-02: 8 mL via INTRAVENOUS

## 2023-02-02 NOTE — Telephone Encounter (Signed)
Patient called with results of recent MRI with assistance of Spanish interpreter, Delorise Royals.  Patient notified that MRI showed no signs of malignancy in either breast. Patient no longer notes any symptoms that prompted need for MRI, including pain, tenderness, or swelling.  Will hold off on PT referral at this time, but patient knows to reach out should symptoms present again. Patient agreeable to the plan and verbalized an understanding of the information.

## 2023-02-06 ENCOUNTER — Ambulatory Visit: Payer: No Typology Code available for payment source

## 2023-02-26 ENCOUNTER — Other Ambulatory Visit: Payer: Self-pay | Admitting: *Deleted

## 2023-02-26 DIAGNOSIS — C50811 Malignant neoplasm of overlapping sites of right female breast: Secondary | ICD-10-CM

## 2023-02-27 ENCOUNTER — Inpatient Hospital Stay: Payer: No Typology Code available for payment source | Attending: Physician Assistant

## 2023-02-27 ENCOUNTER — Inpatient Hospital Stay: Payer: No Typology Code available for payment source | Admitting: Hematology and Oncology

## 2023-03-06 ENCOUNTER — Ambulatory Visit: Payer: No Typology Code available for payment source

## 2023-03-06 ENCOUNTER — Ambulatory Visit: Payer: No Typology Code available for payment source | Admitting: Hematology and Oncology

## 2023-03-06 ENCOUNTER — Other Ambulatory Visit: Payer: Self-pay

## 2023-03-06 DIAGNOSIS — Z853 Personal history of malignant neoplasm of breast: Secondary | ICD-10-CM

## 2023-04-30 ENCOUNTER — Ambulatory Visit
Admission: RE | Admit: 2023-04-30 | Discharge: 2023-04-30 | Disposition: A | Discharge status: Home / Self Care | Payer: No Typology Code available for payment source | Source: Ambulatory Visit | Attending: Obstetrics and Gynecology | Admitting: Obstetrics and Gynecology

## 2023-04-30 ENCOUNTER — Ambulatory Visit: Payer: Self-pay | Admitting: Hematology and Oncology

## 2023-04-30 VITALS — BP 134/95 | Wt 167.9 lb

## 2023-04-30 DIAGNOSIS — Z01419 Encounter for gynecological examination (general) (routine) without abnormal findings: Secondary | ICD-10-CM

## 2023-04-30 DIAGNOSIS — Z853 Personal history of malignant neoplasm of breast: Secondary | ICD-10-CM

## 2023-04-30 NOTE — Patient Instructions (Signed)
Taught Kaira Dannell Deryke about self breast awareness and gave educational materials to take home. Patient did need a Pap smear today due to last Pap smear was in 04/26/2019 per patient. Let her know [facility] will cover Pap smears every 5 years unless has a history of abnormal Pap smears. Referred patient to the [facility] for diagnostic mammogram. Appointment scheduled for 04/30/2023. Patient aware of appointment and will be there. Let patient know will follow up with her within the next couple weeks with results. Aubry Brittanyann Elizardo verbalized understanding.  Pascal Lux, NP 10:19 AM

## 2023-04-30 NOTE — Progress Notes (Addendum)
[name] is a 32 y.o. [phone] female who presents to [facility] clinic today with no complaints.    Pap Smear: Pap smear completed today. Last Pap smear was 04/26/2019 and was normal. Per patient has no history of an abnormal Pap smear. Last Pap smear result is available in Epic.   Physical exam: Breasts Breasts symmetrical. No skin abnormalities bilateral breasts. No nipple retraction bilateral breasts. No nipple discharge bilateral breasts. No lymphadenopathy. No lumps palpated bilateral breasts.       Pelvic/Bimanual Ext Genitalia No lesions, no swelling and no discharge observed on external genitalia.        Vagina Vagina pink and normal texture. No lesions or discharge observed in vagina.        Cervix Cervix is present. Cervix pink and of normal texture. No discharge observed.    Uterus Uterus is present and palpable. Uterus in normal position and normal size.        Adnexae Bilateral ovaries present and palpable. No tenderness on palpation.         Rectovaginal No rectal exam completed today since patient had no rectal complaints. No skin abnormalities observed on exam.     Smoking History: Patient has never smoked and was not referred to quit line.    Patient Navigation: Patient education provided. Access to services provided for patient through [facility]CP program. Natale Lay interpreter provided. No transportation provided   Colorectal Cancer Screening: Per patient has never had colonoscopy completed No complaints today.    Breast and Cervical Cancer Risk Assessment: Patient has family history of breast cancer, with her mother and maternal aunt. Patient does not have history of cervical dysplasia, immunocompromised, or DES exposure in-utero.  Risk Assessment   No risk assessment data       A: [facility] exam with pap smear No complaints with benign exam. Personal history of right breast cancer diagnosed and treated in 2021 with lumpectomy and  chemotherapy. Continues to follow oncology.   P: Referred patient to the [facility] for a diagnostic mammogram. Appointment scheduled 04/30/2023.  Ilda Basset A, NP 04/30/2023 10:18 AM

## 2023-05-01 LAB — CYTOLOGY - PAP
Adequacy: ABSENT
Comment: NEGATIVE
Diagnosis: NEGATIVE
High risk HPV: NEGATIVE

## 2023-05-04 ENCOUNTER — Other Ambulatory Visit (HOSPITAL_COMMUNITY): Payer: Self-pay

## 2023-05-04 ENCOUNTER — Other Ambulatory Visit: Payer: Self-pay | Admitting: Hematology and Oncology

## 2023-05-04 ENCOUNTER — Telehealth: Payer: Self-pay

## 2023-05-04 MED ORDER — METRONIDAZOLE 500 MG PO TABS
500.0000 mg | ORAL_TABLET | Freq: Two times a day (BID) | ORAL | 0 refills | Status: AC
  Filled 2023-05-04: qty 14, 7d supply, fill #0

## 2023-05-04 NOTE — Telephone Encounter (Signed)
[facility] # [phone] [employer] (Spanish), Patient informed negative Pap/HPV results, repeat pap in 5 years, did reveal bacterial vaginosis. Prescription metronidazole was sent to [facility], needs to take 1 po bid x 7 days, avoid alcohol. Patient informed needs mammograms every year, verbalized understanding.

## 2023-05-05 ENCOUNTER — Telehealth: Payer: Self-pay

## 2023-05-05 NOTE — Telephone Encounter (Signed)
[facility]ia, Agustina Carol[facility] # [phone] [facility] [employer]), Patient called and needed to know more information regarding Bacterial Vaginosis,and if she had contracted it sexually. Patient informed that BV is not a sexually transmitted disease, it is caused an overgrowth of normal bacteria in the vagina that causes an imbalance in the bacteria. It is a common infection within women. Patient informed rx metronidazole was sent to [facility] outpatient pharmacy. Patient verbalized understanding.

## 2023-05-13 ENCOUNTER — Other Ambulatory Visit (HOSPITAL_COMMUNITY): Payer: Self-pay

## 2023-11-19 ENCOUNTER — Telehealth: Payer: Self-pay

## 2023-11-19 NOTE — Telephone Encounter (Signed)
 Verbally confirmed appt for 6/9

## 2023-11-23 ENCOUNTER — Inpatient Hospital Stay

## 2023-11-23 ENCOUNTER — Inpatient Hospital Stay: Attending: Hematology and Oncology | Admitting: Hematology and Oncology

## 2023-11-23 VITALS — BP 116/59 | HR 89 | Temp 98.1°F | Resp 16 | Wt 166.3 lb

## 2023-11-23 DIAGNOSIS — Z853 Personal history of malignant neoplasm of breast: Secondary | ICD-10-CM

## 2023-11-23 DIAGNOSIS — Z3201 Encounter for pregnancy test, result positive: Secondary | ICD-10-CM | POA: Insufficient documentation

## 2023-11-23 DIAGNOSIS — Z923 Personal history of irradiation: Secondary | ICD-10-CM | POA: Insufficient documentation

## 2023-11-23 DIAGNOSIS — Z1502 Genetic susceptibility to malignant neoplasm of ovary: Secondary | ICD-10-CM | POA: Insufficient documentation

## 2023-11-23 DIAGNOSIS — Z331 Pregnant state, incidental: Secondary | ICD-10-CM | POA: Insufficient documentation

## 2023-11-23 DIAGNOSIS — Z1501 Genetic susceptibility to malignant neoplasm of breast: Secondary | ICD-10-CM

## 2023-11-23 DIAGNOSIS — Z9221 Personal history of antineoplastic chemotherapy: Secondary | ICD-10-CM | POA: Insufficient documentation

## 2023-11-23 DIAGNOSIS — Z171 Estrogen receptor negative status [ER-]: Secondary | ICD-10-CM

## 2023-11-23 DIAGNOSIS — Z1509 Genetic susceptibility to other malignant neoplasm: Secondary | ICD-10-CM

## 2023-11-23 DIAGNOSIS — C50811 Malignant neoplasm of overlapping sites of right female breast: Secondary | ICD-10-CM

## 2023-11-23 LAB — CMP (CANCER CENTER ONLY)
ALT: 20 U/L (ref 0–44)
AST: 16 U/L (ref 15–41)
Albumin: 4.3 g/dL (ref 3.5–5.0)
Alkaline Phosphatase: 81 U/L (ref 38–126)
Anion gap: 7 (ref 5–15)
BUN: 7 mg/dL (ref 6–20)
CO2: 22 mmol/L (ref 22–32)
Calcium: 9.2 mg/dL (ref 8.9–10.3)
Chloride: 106 mmol/L (ref 98–111)
Creatinine: 0.47 mg/dL (ref 0.44–1.00)
GFR, Estimated: >60 mL/min (ref >60)
Glucose, Bld: 132 mg/dL — ABNORMAL HIGH (ref 70–99)
Potassium: 3.5 mmol/L (ref 3.5–5.1)
Sodium: 135 mmol/L (ref 135–145)
Total Bilirubin: 0.5 mg/dL (ref 0.0–1.2)
Total Protein: 7.6 g/dL (ref 6.5–8.1)

## 2023-11-23 LAB — CBC WITH DIFFERENTIAL/PLATELET
Abs Immature Granulocytes: 0.03 10*3/uL (ref 0.00–0.07)
Basophils Absolute: 0 10*3/uL (ref 0.0–0.1)
Basophils Relative: 0 %
Eosinophils Absolute: 0.1 10*3/uL (ref 0.0–0.5)
Eosinophils Relative: 1 %
HCT: 33.5 % — ABNORMAL LOW (ref 36.0–46.0)
Hemoglobin: 12 g/dL (ref 12.0–15.0)
Immature Granulocytes: 0 %
Lymphocytes Relative: 24 %
Lymphs Abs: 1.7 10*3/uL (ref 0.7–4.0)
MCH: 31.8 pg (ref 26.0–34.0)
MCHC: 35.8 g/dL (ref 30.0–36.0)
MCV: 88.9 fL (ref 80.0–100.0)
Monocytes Absolute: 0.4 10*3/uL (ref 0.1–1.0)
Monocytes Relative: 6 %
Neutro Abs: 4.7 10*3/uL (ref 1.7–7.7)
Neutrophils Relative %: 69 %
Platelets: 266 10*3/uL (ref 150–400)
RBC: 3.77 MIL/uL — ABNORMAL LOW (ref 3.87–5.11)
RDW: 11.9 % (ref 11.5–15.5)
WBC: 6.9 10*3/uL (ref 4.0–10.5)
nRBC: 0 % (ref 0.0–0.2)

## 2023-11-23 LAB — HCG, SERUM, QUALITATIVE: Preg, Serum: POSITIVE — AB

## 2023-11-23 NOTE — Assessment & Plan Note (Addendum)
 The patient is a 33 year old Spanish-speaking woman with 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  Once again given the BRCA1 mutation, she is on intensified breast ca screening and ovarian cancer screening with TVU as well as tumor markers.  She is here because she is pregnant.  Assessment and Plan Assessment & Plan Pregnancy Pregnancy confirmed by patient.. Obstetrician-gynecologist appointment scheduled for next month. - Schedule an appointment with an obstetrician-gynecologist as soon as possible. - Perform blood work today.  Breast cancer screening deferred due to pregnancy Breast cancer screening deferred due to pregnancy. No breast changes noted. Ovarian cancer screening via blood tests remains feasible. - Defer MRI and mammogram until after childbirth. - Perform blood tests for ovarian cancer screening. - No palpable masses or regional adenopathy on exam today  RTC in 6 months or sooner as needed.

## 2023-11-23 NOTE — Progress Notes (Signed)
 [facility] Cancer Follow up:    Derrel Flies, MD [address] [place] [address]   DIAGNOSIS:  Cancer Staging  No matching staging information was found for the patient.   SUMMARY OF ONCOLOGIC HISTORY: 33 y.o. BRCA1 positive Brianna Owens woman status post right breast overlapping sites biopsy 05/04/2019 for a clinical T3 N0, stage IIIB invasive ductal carcinoma, grade 2, triple negative, with an MIB-1 of 40%.             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  The Common Hereditary Gene Panel offered by [facility] includes sequencing and/or deletion duplication testing of the following 48 genes: APC, ATM, AXIN2, BARD1, BMPR1A, BRCA1, BRCA2, BRIP1, CDH1, CDK4, CDKN2A (p14ARF), CDKN2A (p16INK4a), CHEK2, CTNNA1, DICER1, EPCAM (Deletion/duplication testing only), GREM1 (promoter region deletion/duplication testing only), KIT, MEN1, MLH1, MSH2, MSH3, MSH6, MUTYH, NBN, NF1, NHTL1, PALB2, PDGFRA, PMS2, POLD1, POLE, PTEN, RAD50, RAD51C, RAD51D, RNF43, SDHB, SDHC, SDHD, SMAD4, SMARCA4. STK11, TP53, TSC1, TSC2, and VHL.  The following genes were evaluated for sequence changes only: SDHA and HOXB13 c.251G>A variant only. The report date is 05/24/2019.             (b) recommended bilateral mastectomies, due to potential barriers/risks with age and intensified screening   (2) neoadjuvant chemotherapy consisting of doxorubicin  and cyclophosphamide  in dose dense fashion x4 started 05/26/2019, completed 07/07/2019, followed by paclitaxel  and carboplatin  weekly x12 started 07/21/2019             (a) breast MRI 09/26/2019 shows a complete radiologic response   (3) status post right lumpectomy 11/17/2019 showing a complete pathologic response [ypT0 ypN0]             (a) a total of 5 axillary  lymph nodes were removed   (4) adjuvant radiation   Radiation Treatment Dates: 01/02/2020 through 02/15/2020 Site Technique Total Dose (Gy) Dose per Fx (Gy) Completed Fx Beam Energies  Breast, Right: Breast_Rt 3D 50.4/50.4 1.8 28/28 6X, 10X  Breast, Right: Breast_Rt_Bst 3D 10/10 2 5/5 6X  Sclav-RT: SCV_Rt 3D 50.4/50.4 1.8 28/28 6X, 10X      (5) considering bilateral salpingo-oophorectomy for ovarian cancer prevention             (a) receiving leuprolide /Lupron  every 28 days   CURRENT THERAPY: observation  INTERVAL HISTORY:  Nalaysia Prim Morace 33 y.o. female returns for f/u  She arrived today with a certified Spanish interpreter.      Discussed the use of AI scribe software for clinical note transcription with the patient, who gave verbal consent to proceed.  History of Present Illness Brianna Owens is a 33 year old female who presents for evaluation of her pregnancy and its impact on her breast cancer screening. She has history of breast cancer and BRCA 1 mutation  She is approximately one month pregnant, with her last menstrual period on October 11, 2023, and a positive pregnancy test on November 15, 2023. She has not yet scheduled an appointment with an obstetrician-gynecologist but plans to do so next month.  She is concerned about the implications of her pregnancy on her ongoing breast cancer screening. No changes in her breasts since her last visit. This is her third pregnancy, and she has two children, a boy and  a girl.  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  MEDICAL HISTORY: Past Medical History:  Diagnosis Date   Breast cancer (HCC)    Cancer (HCC) 05/05/2019   Personal history of chemotherapy    Personal history of radiation therapy     SURGICAL HISTORY: Past Surgical History:  Procedure Laterality Date   BREAST BIOPSY     BREAST LUMPECTOMY     BREAST LUMPECTOMY WITH RADIOACTIVE SEED AND SENTINEL LYMPH NODE BIOPSY Right 11/17/2019   Procedure: RIGHT BREAST LUMPECTOMY WITH RADIOACTIVE SEED;  Surgeon: Sim Dryer, MD;  Location: [facility] OR;  Service: General;  Laterality: Right;  PEC BLOCK   IR IMAGING GUIDED PORT INSERTION  05/24/2019   IR REMOVAL TUN ACCESS W/ PORT W/O FL MOD SED  07/18/2020   SENTINEL NODE BIOPSY Right 11/17/2019   Procedure: Sentinel Node Biopsy;  Surgeon: Sim Dryer, MD;  Location: [facility] OR;  Service: General;  Laterality: Right;    SOCIAL HISTORY: Social History   Socioeconomic History   Marital status: Single    Spouse name: Not on file   Number of children: Not on file   Years of education: Not on file   Highest education level: 9th grade  Occupational History   Not on file  Tobacco Use   Smoking status: Never   Smokeless tobacco: Never  Vaping Use   Vaping status: Never Used  Substance and Sexual Activity   Alcohol use: Not Currently   Drug use: Not Currently   Sexual activity: Yes    Birth control/protection: I.U.D.  Other Topics Concern   Not on file  Social History Narrative   Not on file   Social Drivers of Health   Financial Resource Strain: Not on file  Food Insecurity: No Food Insecurity (04/30/2023)   Hunger Vital Sign    Worried About Running Out of Food in the Last Year: Never true    Ran Out of Food in the Last Year: Never true  Transportation Needs: No Transportation Needs (04/30/2023)   PRAPARE - Administrator, Civil Service (Medical): No    Lack of Transportation (Non-Medical): No  Physical Activity: Not  on file  Stress: Not on file  Social Connections: Unknown (11/02/2021)   [facility] from [facility], [facility]   Social Network    Social Network: Not on file  Intimate Partner Violence: Unknown (11/02/2021)   Received from [facility], [facility]   HITS    Physically Hurt: Not on file    Insult or Talk Down To: Not on file    Threaten Physical Harm: Not on file    Scream or Curse: Not on file    FAMILY HISTORY: Family History  Problem Relation Age of Onset   Breast cancer Mother    Breast cancer Maternal Aunt    Ovarian cancer Neg Hx    Colon cancer Neg Hx    Uterine cancer Neg Hx     Review of Systems  Constitutional:  Negative for appetite change, chills, fatigue, fever and  unexpected weight change.  HENT:   Negative for hearing loss, lump/mass and trouble swallowing.   Eyes:  Negative for eye problems and icterus.  Respiratory:  Negative for chest tightness, cough and shortness of breath.   Cardiovascular:  Negative for chest pain, leg swelling and palpitations.  Gastrointestinal:  Negative for abdominal distention, abdominal pain, constipation, diarrhea, nausea and vomiting.  Endocrine: Negative for hot flashes.  Genitourinary:  Negative for difficulty urinating.   Musculoskeletal:  Negative for arthralgias.  Skin:  Negative for itching and rash.  Neurological:  Negative for dizziness, extremity weakness, headaches and numbness.  Hematological:  Negative for adenopathy. Does not bruise/bleed easily.  Psychiatric/Behavioral:  Negative for depression. The patient is not nervous/anxious.       PHYSICAL EXAMINATION  ECOG PERFORMANCE STATUS: 1 - Symptomatic but completely ambulatory  Vitals:   11/23/23 0904  BP: (!) 116/59  Pulse: 89  Resp: 16  Temp: 98.1 F (36.7 C)  SpO2: 100%   Physical Exam Constitutional:      Appearance: Normal appearance.  Chest:     Comments: Both breasts inspected and palpated.  No palpable masses or regional  adenopathy Musculoskeletal:     Cervical back: Normal range of motion and neck supple. No rigidity.  Lymphadenopathy:     Cervical: No cervical adenopathy.  Neurological:     Mental Status: She is alert.      LABORATORY DATA:  CBC    Component Value Date/Time   WBC 7.9 11/05/2021 1203   WBC 8.5 10/31/2021 1101   RBC 3.93 11/05/2021 1203   HGB 12.4 11/05/2021 1203   HCT 35.3 (L) 11/05/2021 1203   PLT 237 11/05/2021 1203   MCV 89.8 11/05/2021 1203   MCH 31.6 11/05/2021 1203   MCHC 35.1 11/05/2021 1203   RDW 12.0 11/05/2021 1203   LYMPHSABS 2.5 11/05/2021 1203   MONOABS 0.6 11/05/2021 1203   EOSABS 0.2 11/05/2021 1203   BASOSABS 0.0 11/05/2021 1203    CMP     Component Value Date/Time   NA 138 11/05/2021 1203   K 3.8 11/05/2021 1203   CL 104 11/05/2021 1203   CO2 28 11/05/2021 1203   GLUCOSE 104 (H) 11/05/2021 1203   BUN 9 11/05/2021 1203   CREATININE 0.49 11/05/2021 1203   CALCIUM 10.0 11/05/2021 1203   PROT 8.1 11/05/2021 1203   ALBUMIN 4.3 11/05/2021 1203   AST 68 (H) 11/05/2021 1203   ALT 156 (H) 11/05/2021 1203   ALKPHOS 101 11/05/2021 1203   BILITOT 0.3 11/05/2021 1203   GFRNONAA >60 11/05/2021 1203   GFRAA >60 03/16/2020 1011   GFRAA >60 07/21/2019 1025    ASSESSMENT and THERAPY PLAN:   Malignant neoplasm of overlapping sites of right breast in female, estrogen receptor negative (HCC) The patient is a 33 year old Spanish-speaking woman with 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  Once again given the BRCA1 mutation, she is on intensified breast ca screening and ovarian cancer screening with [facility] as well as tumor markers.  She is here because she is pregnant.  Assessment and Plan Assessment & Plan Pregnancy Pregnancy confirmed by patient.. Obstetrician-gynecologist appointment scheduled for next month. - Schedule an  appointment with an obstetrician-gynecologist as soon as possible. - Perform blood work today.  Breast cancer screening deferred due to pregnancy Breast cancer screening deferred due to pregnancy. No breast changes noted. Ovarian cancer screening via blood tests remains feasible. -  Defer MRI and mammogram until after childbirth. - Perform blood tests for ovarian cancer screening. - No palpable masses or regional adenopathy on exam today  RTC in 6 months or sooner as needed.    All questions were answered. The patient knows to call the clinic with any problems, questions or concerns. We can certainly see the patient much sooner if necessary.  Total encounter time:30 minutes*in face-to-face visit time, chart review, lab review, care coordination, order entry, and documentation of the encounter time.  *Total Encounter Time as defined by the Centers for Medicare and Medicaid Services includes, in addition to the face-to-face time of a patient visit (documented in the note above) non-face-to-face time: obtaining and reviewing outside history, ordering and reviewing medications, tests or procedures, care coordination (communications with other health care professionals or caregivers) and documentation in the medical record.

## 2023-11-24 LAB — CA 125: Cancer Antigen (CA) 125: 26.8 U/mL (ref 0.0–38.1)

## 2024-05-24 ENCOUNTER — Inpatient Hospital Stay: Payer: Self-pay | Attending: Hematology and Oncology | Admitting: Hematology and Oncology
